# Patient Record
Sex: Male | Born: 1935 | Race: White | Hispanic: No | Marital: Married | Smoking: Former smoker
Health system: Southern US, Community
[De-identification: ages and names within clinical notes are randomized; demographics above are authoritative.]

## PROBLEM LIST (undated history)

## (undated) DIAGNOSIS — N400 Enlarged prostate without lower urinary tract symptoms: Secondary | ICD-10-CM

## (undated) DIAGNOSIS — R3915 Urgency of urination: Secondary | ICD-10-CM

## (undated) DIAGNOSIS — K449 Diaphragmatic hernia without obstruction or gangrene: Secondary | ICD-10-CM

## (undated) DIAGNOSIS — K644 Residual hemorrhoidal skin tags: Secondary | ICD-10-CM

## (undated) DIAGNOSIS — N200 Calculus of kidney: Secondary | ICD-10-CM

## (undated) DIAGNOSIS — M1990 Unspecified osteoarthritis, unspecified site: Secondary | ICD-10-CM

## (undated) DIAGNOSIS — IMO0001 Reserved for inherently not codable concepts without codable children: Secondary | ICD-10-CM

## (undated) DIAGNOSIS — Z87898 Personal history of other specified conditions: Secondary | ICD-10-CM

## (undated) DIAGNOSIS — Z5189 Encounter for other specified aftercare: Secondary | ICD-10-CM

## (undated) DIAGNOSIS — K922 Gastrointestinal hemorrhage, unspecified: Secondary | ICD-10-CM

## (undated) DIAGNOSIS — M810 Age-related osteoporosis without current pathological fracture: Secondary | ICD-10-CM

## (undated) DIAGNOSIS — Z87442 Personal history of urinary calculi: Secondary | ICD-10-CM

## (undated) DIAGNOSIS — K219 Gastro-esophageal reflux disease without esophagitis: Secondary | ICD-10-CM

## (undated) DIAGNOSIS — Z8739 Personal history of other diseases of the musculoskeletal system and connective tissue: Secondary | ICD-10-CM

## (undated) DIAGNOSIS — D649 Anemia, unspecified: Secondary | ICD-10-CM

## (undated) HISTORY — DX: Residual hemorrhoidal skin tags: K64.4

## (undated) HISTORY — PX: CATARACT EXTRACTION: SUR2

## (undated) HISTORY — DX: Age-related osteoporosis without current pathological fracture: M81.0

## (undated) HISTORY — DX: Reserved for inherently not codable concepts without codable children: IMO0001

## (undated) HISTORY — DX: Encounter for other specified aftercare: Z51.89

## (undated) HISTORY — DX: Unspecified osteoarthritis, unspecified site: M19.90

## (undated) HISTORY — PX: OTHER SURGICAL HISTORY: SHX169

## (undated) HISTORY — PX: KNEE ARTHROSCOPY: SUR90

## (undated) HISTORY — PX: COLONOSCOPY: SHX174

## (undated) HISTORY — PX: JOINT REPLACEMENT: SHX530

---
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
## 1983-07-14 HISTORY — PX: ABDOMINAL SURGERY: SHX537

## 1998-01-26 ENCOUNTER — Emergency Department (HOSPITAL_COMMUNITY): Admission: EM | Admit: 1998-01-26 | Discharge: 1998-01-26 | Payer: Self-pay | Admitting: Family Medicine

## 2000-01-23 ENCOUNTER — Encounter: Payer: Self-pay | Admitting: Emergency Medicine

## 2000-01-23 ENCOUNTER — Emergency Department (HOSPITAL_COMMUNITY): Admission: EM | Admit: 2000-01-23 | Discharge: 2000-01-23 | Payer: Self-pay | Admitting: Emergency Medicine

## 2000-02-27 ENCOUNTER — Encounter: Admission: RE | Admit: 2000-02-27 | Discharge: 2000-02-27 | Payer: Self-pay | Admitting: *Deleted

## 2000-02-27 ENCOUNTER — Encounter: Payer: Self-pay | Admitting: *Deleted

## 2003-08-02 ENCOUNTER — Emergency Department (HOSPITAL_COMMUNITY): Admission: EM | Admit: 2003-08-02 | Discharge: 2003-08-02 | Payer: Self-pay | Admitting: Emergency Medicine

## 2003-08-05 ENCOUNTER — Emergency Department (HOSPITAL_COMMUNITY): Admission: EM | Admit: 2003-08-05 | Discharge: 2003-08-05 | Payer: Self-pay | Admitting: Emergency Medicine

## 2003-11-26 ENCOUNTER — Encounter: Payer: Self-pay | Admitting: Gastroenterology

## 2004-11-29 ENCOUNTER — Emergency Department (HOSPITAL_COMMUNITY): Admission: EM | Admit: 2004-11-29 | Discharge: 2004-11-29 | Payer: Self-pay | Admitting: Emergency Medicine

## 2006-04-05 ENCOUNTER — Encounter: Admission: RE | Admit: 2006-04-05 | Discharge: 2006-04-05 | Payer: Self-pay | Admitting: Internal Medicine

## 2006-04-05 IMAGING — CT CT PELVIS W/O CM
1 of 4 series · 15 of 36 positions shown, 19 images · IV contrast (agent unspecified)
Comparison: none

CLINICAL DATA: History of abdominal pain. 
 ABDOMEN CT WITHOUT CONTRAST:
TECHNIQUE: Multidetector CT imaging of the abdomen was performed following the standard protocol without IV contrast.
TECHNIQUE: Multidetector CT imaging of the pelvis was performed following the standard protocol without IV contrast.

[Series 2: renal stone · axial · 0.74mm/px · z∈[-408,-44]mm · 15 of 79 slices shown, 19 images]
[im 4/79  soft-tissue]
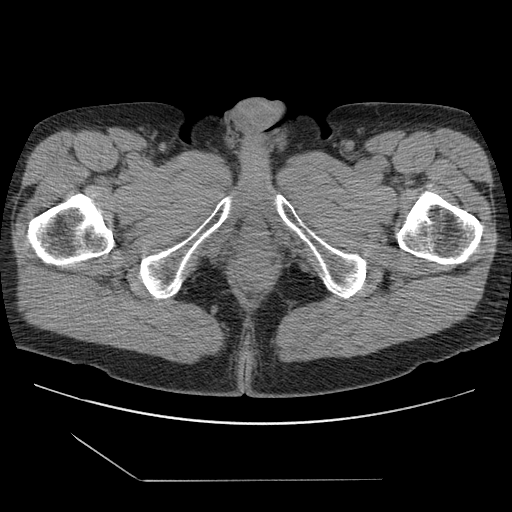
[im 4/79  bone]
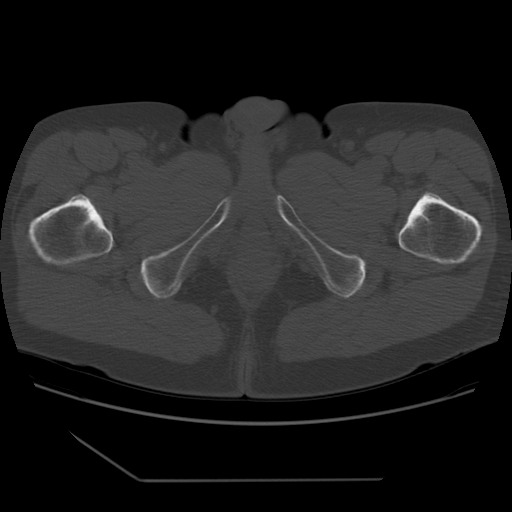
[im 10/79  soft-tissue]
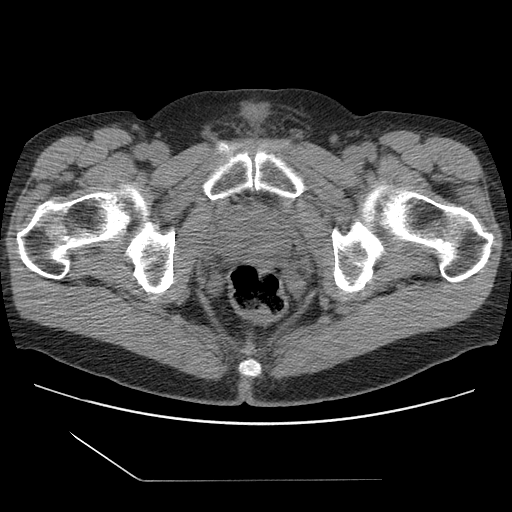
[im 16/79  soft-tissue]
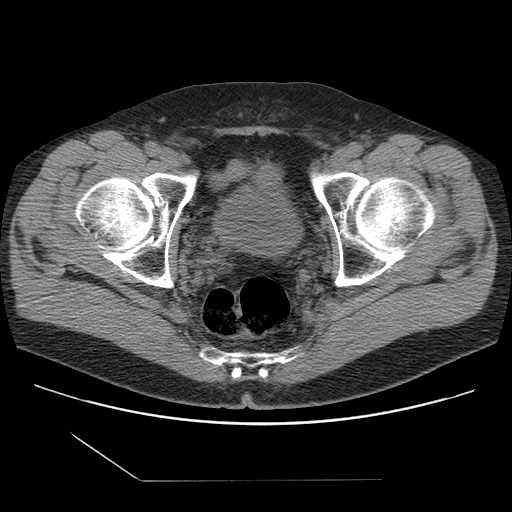
[im 22/79  soft-tissue]
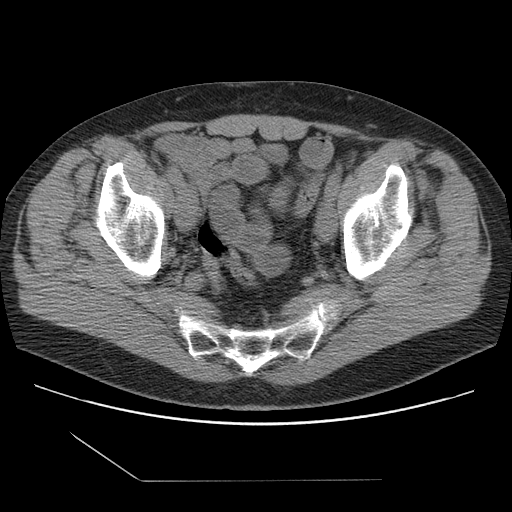
[im 28/79  soft-tissue]
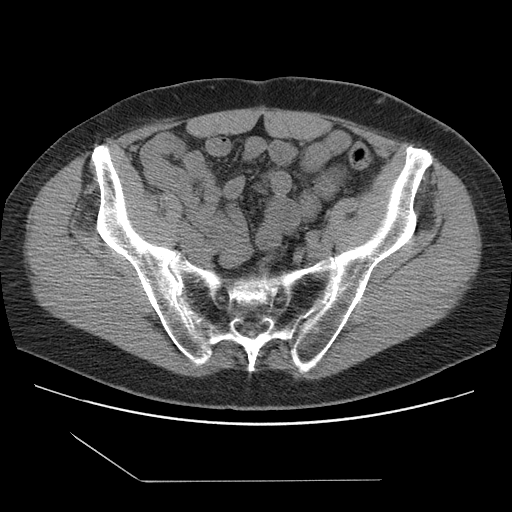
[im 34/79  soft-tissue]
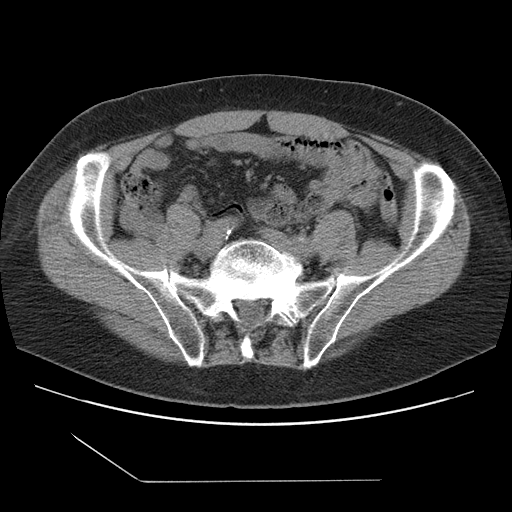
[im 40/79  soft-tissue]
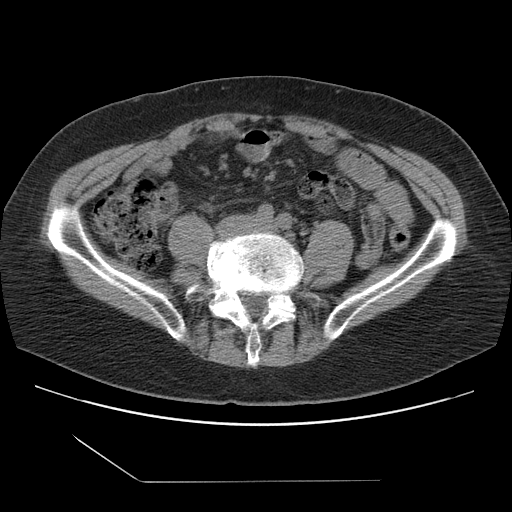
[im 46/79  soft-tissue]
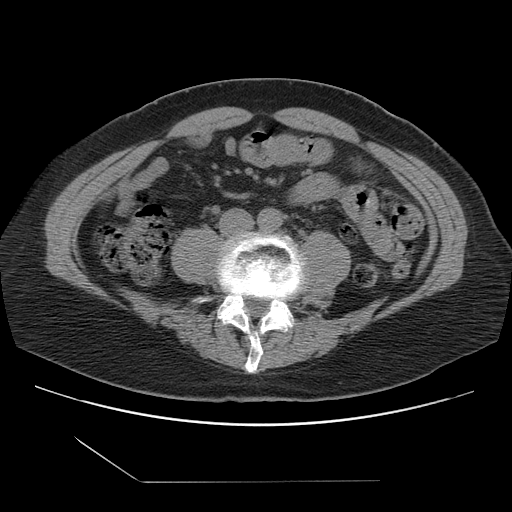
[im 52/79  soft-tissue]
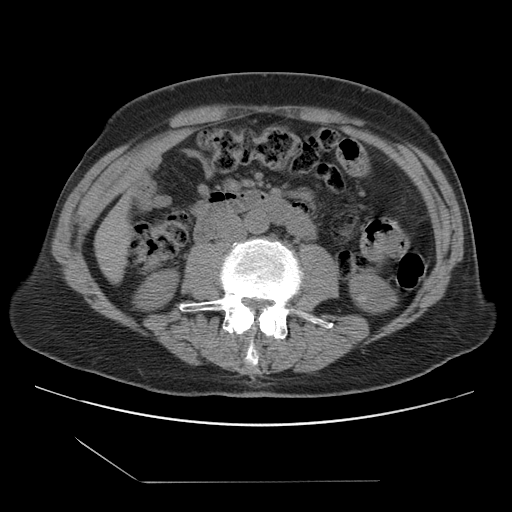
[im 52/79  bone]
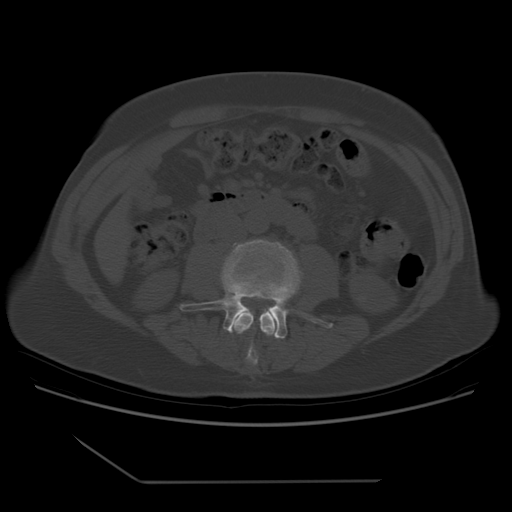
[im 58/79  soft-tissue]
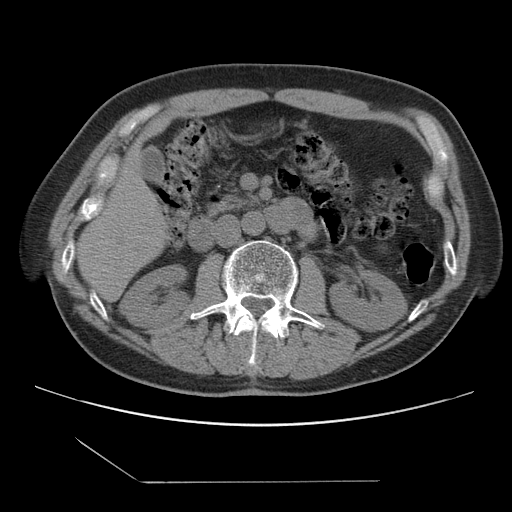
[im 64/79  soft-tissue]
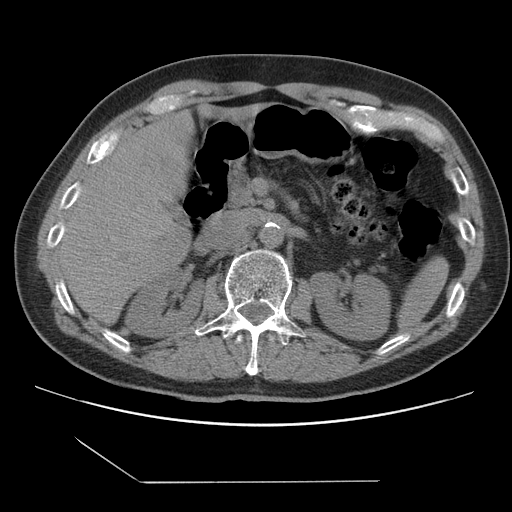
[im 67/79  lung]
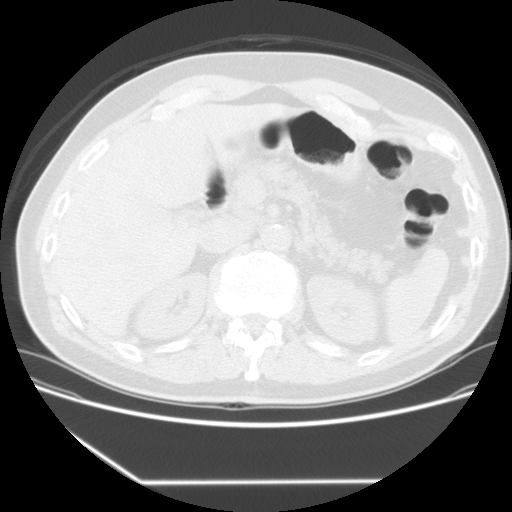
[im 70/79  soft-tissue]
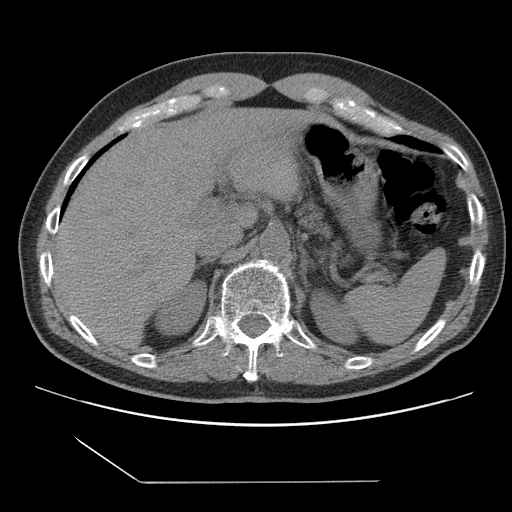
[im 70/79  lung]
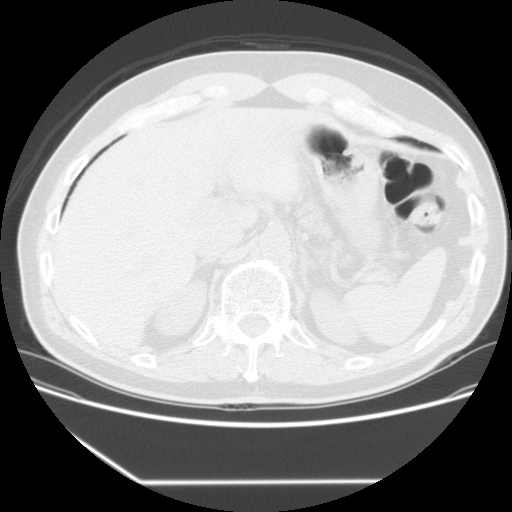
[im 73/79  lung]
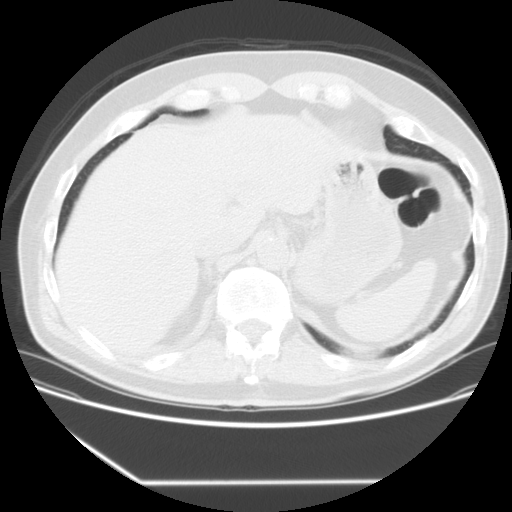
[im 76/79  soft-tissue]
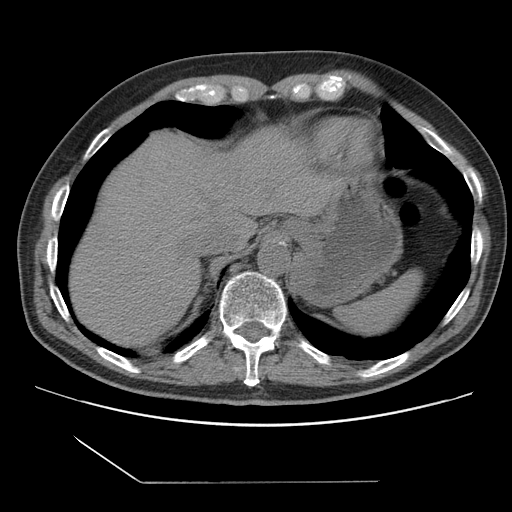
[im 76/79  lung]
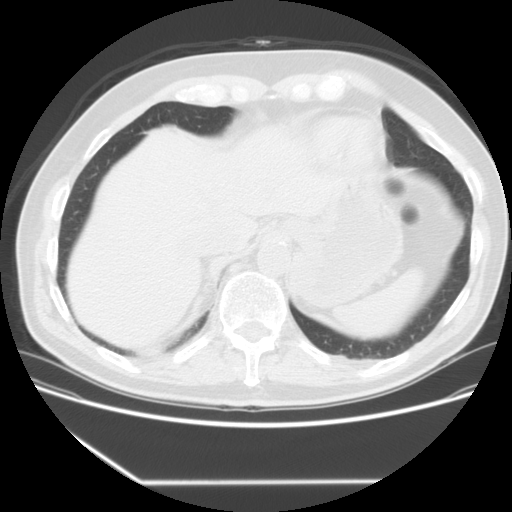

[15 of 36 positions shown; findings below may reference images not displayed]

FINDINGS: The lung bases are clear.   The liver appears normal in the unenhanced state.  No calcified gallstones are seen.  The pancreas is normal in size with normal peripancreatic fat planes.  The adrenal glands and spleen appear normal.  No hydronephrosis is seen and no renal calculi are noted. A small hyperdense cyst is noted in the lower pole of the left kidney. The abdominal aorta is normal in caliber.    No adenopathy is seen.
IMPRESSION: No renal or ureteral calculi are noted.  No hydronephrosis. 
 PELVIS CT WITHOUT CONTRAST:
FINDINGS: Scans were continued through the pelvis in the unenhanced state.   The distal ureters are normal in caliber.  The urinary bladder is decompressed and difficult to assess. The prostate is slightly prominent.  No pelvic mass of fluid is seen.  No acute bony abnormality is noted.    The terminal ileum and appendix appear normal.
IMPRESSION: 1.  No acute abnormality on CT of the pelvis.  The terminal ileum and appendix appear normal.  
 2.  Slightly prominent prostate.

## 2007-07-31 ENCOUNTER — Emergency Department (HOSPITAL_COMMUNITY): Admission: EM | Admit: 2007-07-31 | Discharge: 2007-07-31 | Payer: Self-pay | Admitting: *Deleted

## 2007-07-31 IMAGING — CT CT ABDOMEN W/O CM
2 of 4 series · 17 of 46 positions shown, 19 images · IV contrast (agent unspecified)
Comparison: Exam [DATE].

CLINICAL DATA: Right side kidney stone, right flank pain.  
 ABDOMEN CT WITHOUT CONTRAST:
TECHNIQUE: Multidetector CT imaging of the abdomen was performed following the standard protocol without IV contrast.
TECHNIQUE: Multidetector CT imaging of the pelvis was performed following the standard protocol without IV contrast.

[Series 2: stone_wo 5.0 b40f st · axial · 0.81mm/px · z∈[-500,-108]mm · 14 of 108 slices shown, 16 images]
[im 5/108  soft-tissue]
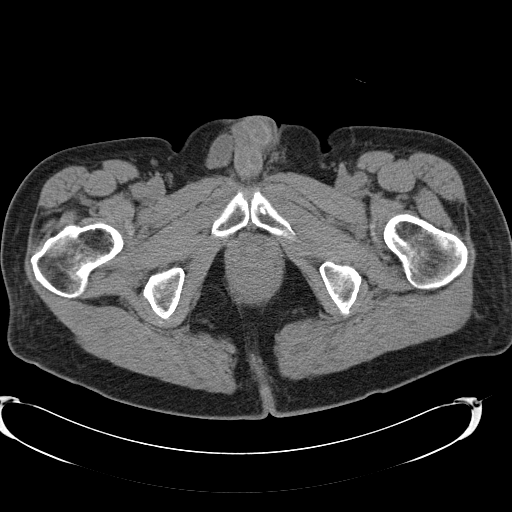
[im 5/108  bone]
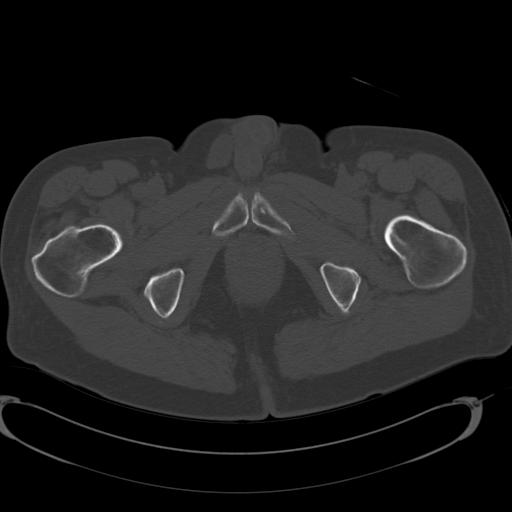
[im 13/108  soft-tissue]
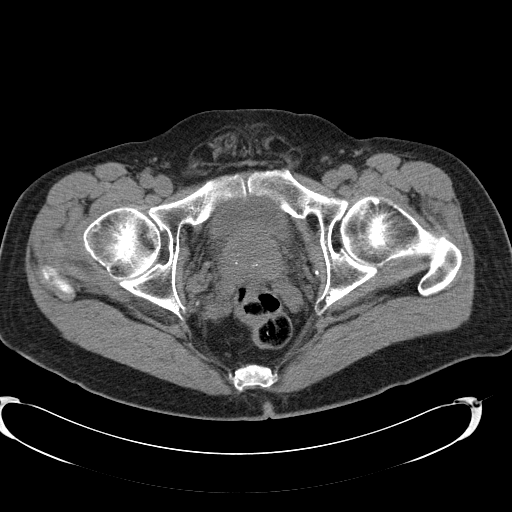
[im 22/108  soft-tissue]
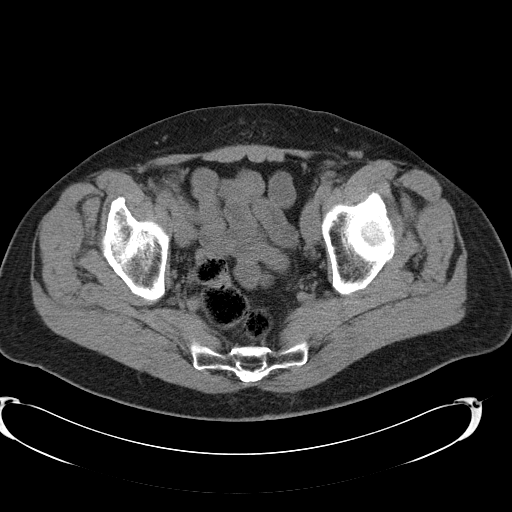
[im 30/108  soft-tissue]
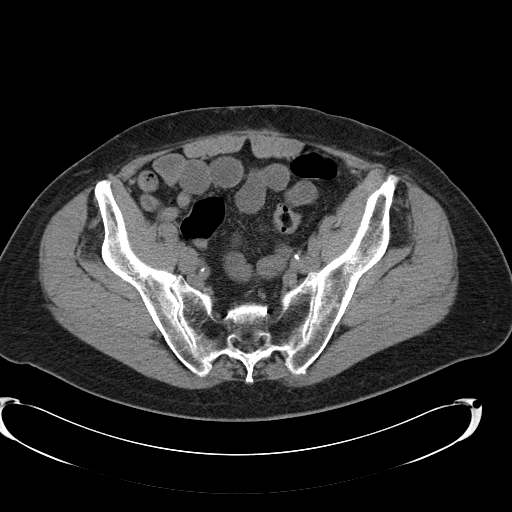
[im 35/108  soft-tissue]
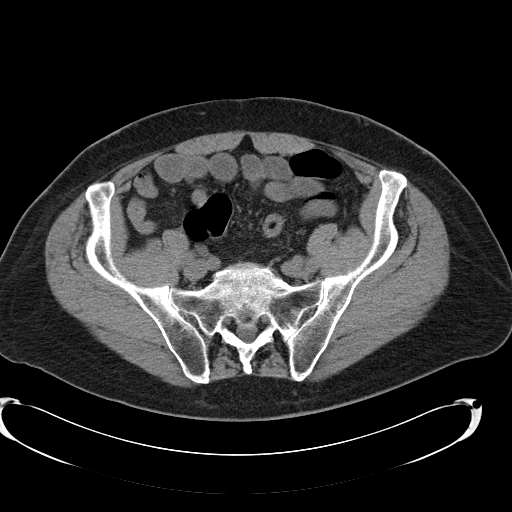
[im 43/108  soft-tissue]
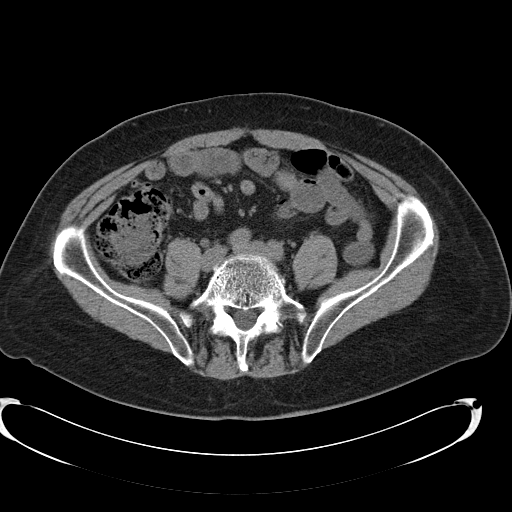
[im 52/108  soft-tissue]
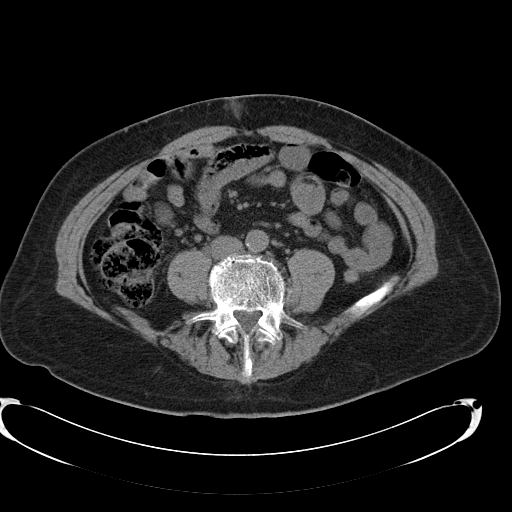
[im 56/108  soft-tissue]
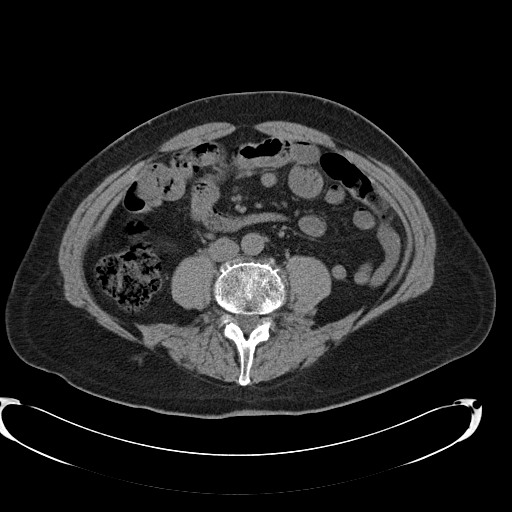
[im 65/108  soft-tissue]
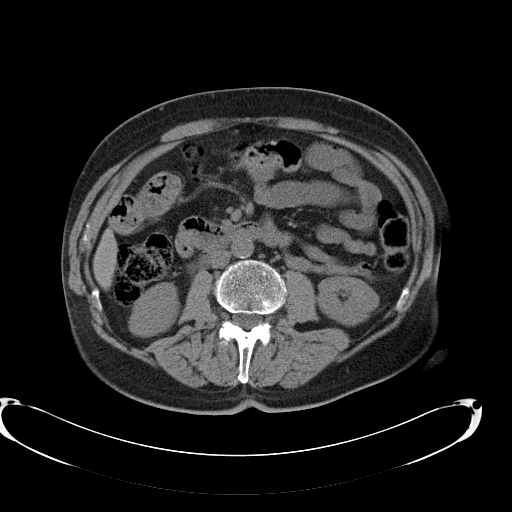
[im 65/108  bone]
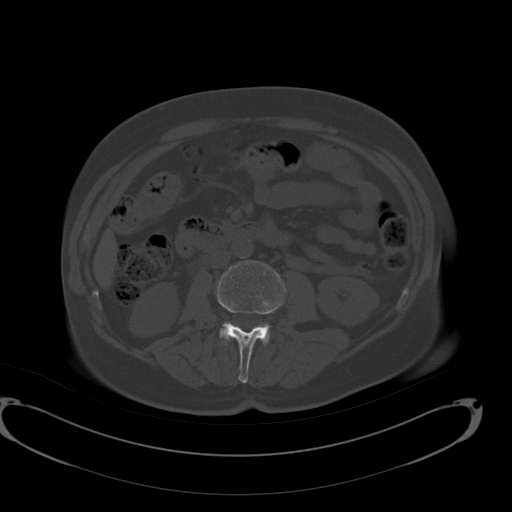
[im 73/108  soft-tissue]
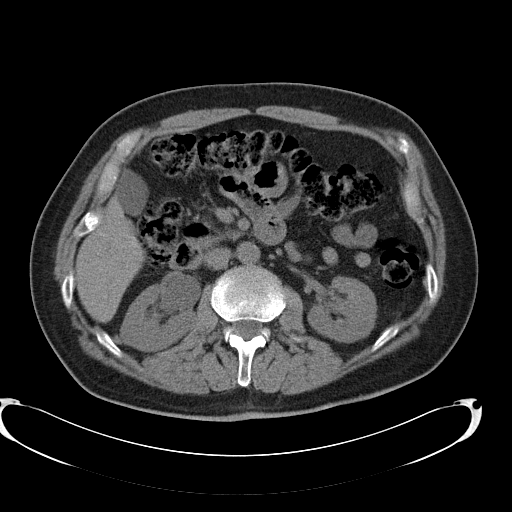
[im 82/108  soft-tissue]
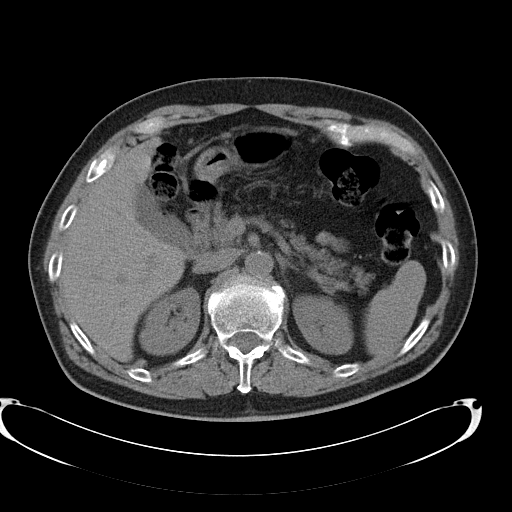
[im 86/108  soft-tissue]
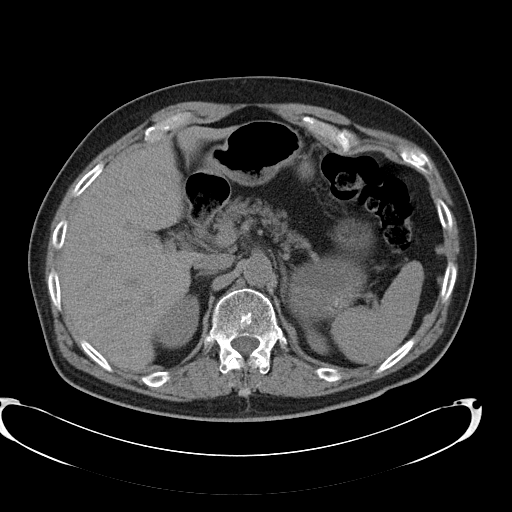
[im 95/108  soft-tissue]
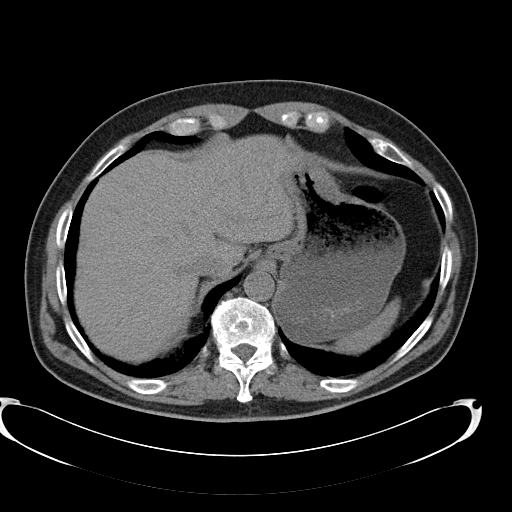
[im 103/108  soft-tissue]
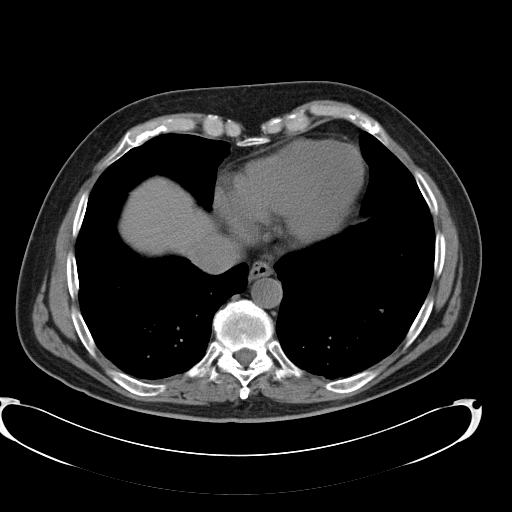

[Series 602: coronal abdomen · coronal · 0.87mm/px · 3 of 120 slices shown]
[im 40/120  soft-tissue]
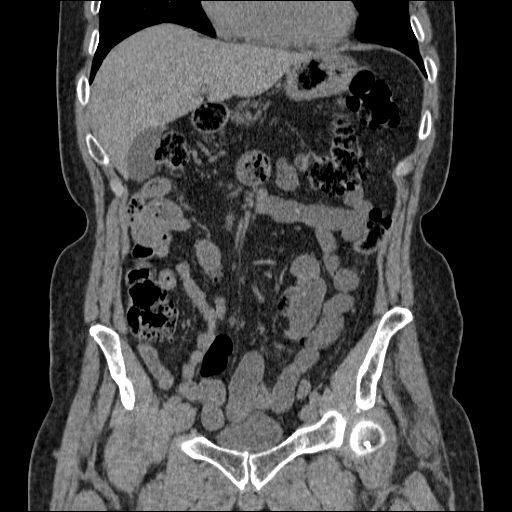
[im 53/120  soft-tissue]
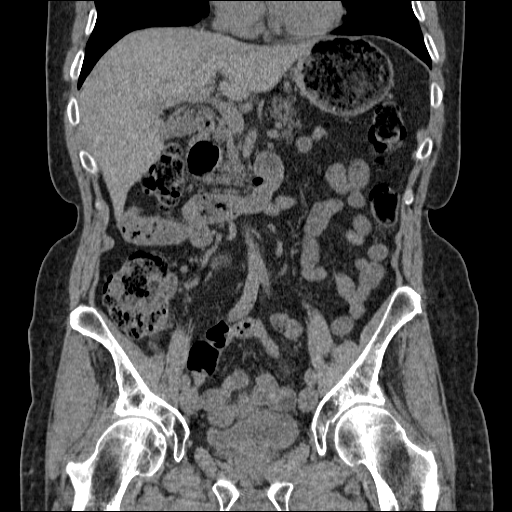
[im 67/120  soft-tissue]
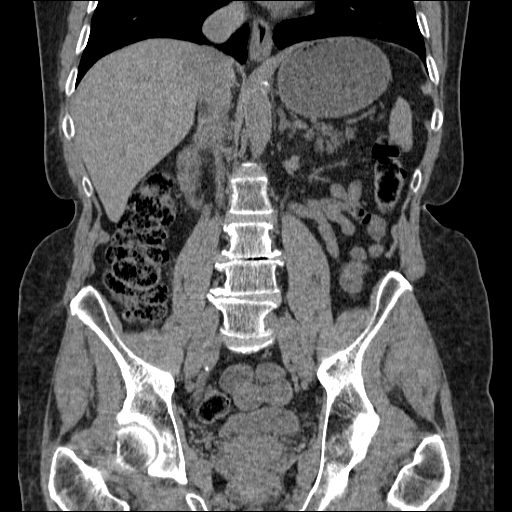

[17 of 46 positions shown; findings below may reference images not displayed]

FINDINGS: Two visualized lung bases are unremarkable.  The unenhanced liver, spleen, pancreas and adrenals are unremarkable.  There is mild right hydronephrosis and right hydroureter.  No calcifications are identified in proximal ureters bilaterally.  The left kidney is unremarkable.  Stool noted throughout the colon.  There is no bowel obstruction.  There is no ascites or free air.  No calcified gallstones are seen.  Mild degenerative changes of the lumbar spine are seen.
IMPRESSION: 1.  Mild right hydronephrosis and right hydroureter. 
 2.  No ureteral calcifications are seen in the proximal ureter.  
 PELVIS CT WITHOUT CONTRAST:
FINDINGS: There is mild dilatation of the distal right ureter.  There is a 6 X 4 mm calculus in distal right ureter about 1.7 cm from right UVJ/right urinary bladder wall.  Mild enlargement of the prostate gland is noted.  There is indentation of the prostate gland of urinary bladder base.  There is no pelvic ascites or adenopathy.  Normal appendix is partially visualized.
IMPRESSION: There is a 6 mm calculus in the distal right ureter about 1.7 cm from the right UVJ.

## 2007-08-23 ENCOUNTER — Ambulatory Visit (HOSPITAL_BASED_OUTPATIENT_CLINIC_OR_DEPARTMENT_OTHER): Admission: RE | Admit: 2007-08-23 | Discharge: 2007-08-23 | Payer: Self-pay | Admitting: Urology

## 2008-07-13 HISTORY — PX: NISSEN FUNDOPLICATION: SHX2091

## 2008-10-16 ENCOUNTER — Encounter (INDEPENDENT_AMBULATORY_CARE_PROVIDER_SITE_OTHER): Payer: Self-pay | Admitting: *Deleted

## 2008-12-07 ENCOUNTER — Ambulatory Visit: Payer: Self-pay | Admitting: Gastroenterology

## 2008-12-07 ENCOUNTER — Telehealth: Payer: Self-pay | Admitting: Gastroenterology

## 2008-12-20 ENCOUNTER — Telehealth (INDEPENDENT_AMBULATORY_CARE_PROVIDER_SITE_OTHER): Payer: Self-pay | Admitting: *Deleted

## 2008-12-27 DIAGNOSIS — K5730 Diverticulosis of large intestine without perforation or abscess without bleeding: Secondary | ICD-10-CM | POA: Insufficient documentation

## 2008-12-27 DIAGNOSIS — K219 Gastro-esophageal reflux disease without esophagitis: Secondary | ICD-10-CM | POA: Insufficient documentation

## 2008-12-27 DIAGNOSIS — N200 Calculus of kidney: Secondary | ICD-10-CM | POA: Insufficient documentation

## 2008-12-28 ENCOUNTER — Ambulatory Visit: Payer: Self-pay | Admitting: Gastroenterology

## 2008-12-28 DIAGNOSIS — N138 Other obstructive and reflux uropathy: Secondary | ICD-10-CM | POA: Insufficient documentation

## 2008-12-28 DIAGNOSIS — N401 Enlarged prostate with lower urinary tract symptoms: Secondary | ICD-10-CM

## 2008-12-28 DIAGNOSIS — R159 Full incontinence of feces: Secondary | ICD-10-CM | POA: Insufficient documentation

## 2008-12-28 LAB — CONVERTED CEMR LAB: Tissue Transglutaminase Ab, IgA: 0.9 units (ref <7)

## 2008-12-31 LAB — CONVERTED CEMR LAB
ALT: 20 units/L (ref 0–53)
Albumin: 4.1 g/dL (ref 3.5–5.2)
BUN: 19 mg/dL (ref 6–23)
Basophils Relative: 0.5 % (ref 0.0–3.0)
CO2: 30 meq/L (ref 19–32)
Calcium: 9.7 mg/dL (ref 8.4–10.5)
Chloride: 105 meq/L (ref 96–112)
Creatinine, Ser: 1 mg/dL (ref 0.4–1.5)
Ferritin: 58.5 ng/mL (ref 22.0–322.0)
Glucose, Bld: 97 mg/dL (ref 70–99)
HCT: 45.2 % (ref 39.0–52.0)
Hemoglobin: 15.8 g/dL (ref 13.0–17.0)
Lymphocytes Relative: 23.9 % (ref 12.0–46.0)
Lymphs Abs: 1.6 10*3/uL (ref 0.7–4.0)
MCHC: 34.9 g/dL (ref 30.0–36.0)
Monocytes Relative: 14.7 % — ABNORMAL HIGH (ref 3.0–12.0)
Neutro Abs: 4.1 10*3/uL (ref 1.4–7.7)
RBC: 4.77 M/uL (ref 4.22–5.81)
TSH: 1.04 microintl units/mL (ref 0.35–5.50)
Total Protein: 7.3 g/dL (ref 6.0–8.3)
Transferrin: 270.9 mg/dL (ref 212.0–360.0)
Vitamin B-12: 191 pg/mL — ABNORMAL LOW (ref 211–911)

## 2009-01-01 ENCOUNTER — Ambulatory Visit: Payer: Self-pay | Admitting: Gastroenterology

## 2009-01-01 DIAGNOSIS — E538 Deficiency of other specified B group vitamins: Secondary | ICD-10-CM | POA: Insufficient documentation

## 2009-01-09 ENCOUNTER — Ambulatory Visit: Payer: Self-pay | Admitting: Gastroenterology

## 2009-01-16 ENCOUNTER — Ambulatory Visit: Payer: Self-pay | Admitting: Gastroenterology

## 2009-01-23 ENCOUNTER — Ambulatory Visit: Payer: Self-pay | Admitting: Gastroenterology

## 2009-01-23 ENCOUNTER — Encounter: Payer: Self-pay | Admitting: Gastroenterology

## 2009-01-24 ENCOUNTER — Telehealth: Payer: Self-pay | Admitting: Gastroenterology

## 2009-01-25 ENCOUNTER — Encounter: Payer: Self-pay | Admitting: Gastroenterology

## 2009-01-28 ENCOUNTER — Telehealth: Payer: Self-pay | Admitting: Gastroenterology

## 2009-01-30 DIAGNOSIS — K449 Diaphragmatic hernia without obstruction or gangrene: Secondary | ICD-10-CM | POA: Insufficient documentation

## 2009-02-14 ENCOUNTER — Ambulatory Visit: Payer: Self-pay | Admitting: Gastroenterology

## 2009-03-01 ENCOUNTER — Encounter: Payer: Self-pay | Admitting: Gastroenterology

## 2009-03-08 ENCOUNTER — Encounter: Admission: RE | Admit: 2009-03-08 | Discharge: 2009-03-08 | Payer: Self-pay | Admitting: Surgery

## 2009-03-08 IMAGING — RF DG UGI W/ HIGH DENSITY W/KUB
18 of 21 series · 18 of 21 positions shown · non-contrast
Comparison: None

CLINICAL DATA: Gastroesophageal reflux disease.

UPPER GI SERIES W/HIGH DENSITY W/KUB
TECHNIQUE: After obtaining a scout radiograph, upper GI series
performed with high density barium and effervescent agent. Thin
barium also used.
Fluoroscopy Time: 2.0 minutes.

[Series 1: run · 1 of 1 slices shown (1 of 17)]
[im 1/1]
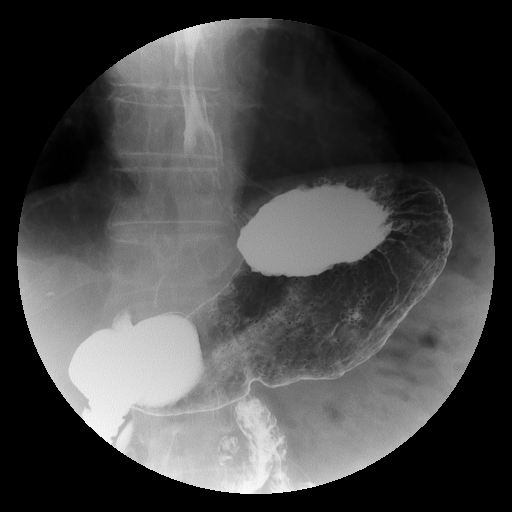

[Series 2: run · 1 of 1 slices shown (2 of 17)]
[im 1/1]
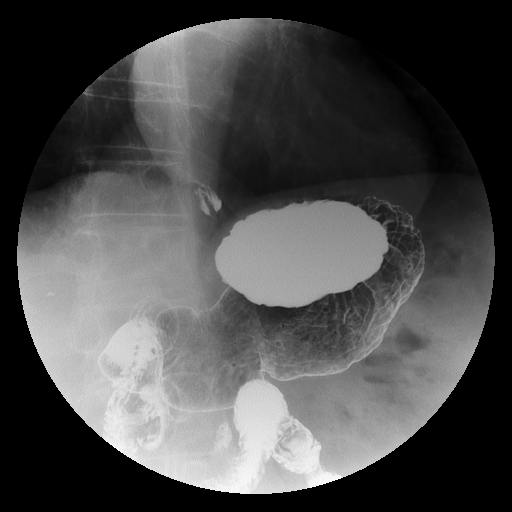

[Series 3: run · 1 of 1 slices shown (3 of 17)]
[im 1/1]
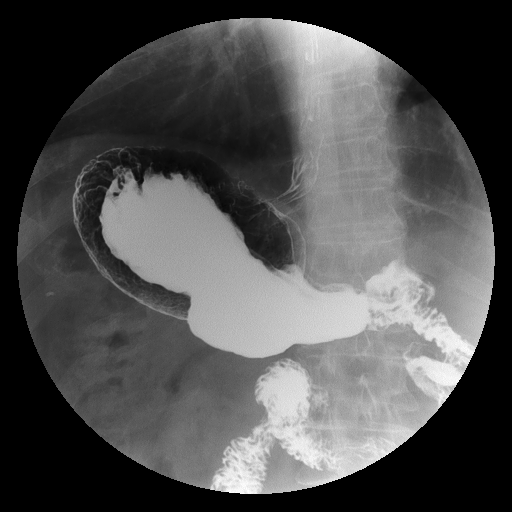

[Series 5: run · 1 of 1 slices shown (4 of 17)]
[im 1/1]
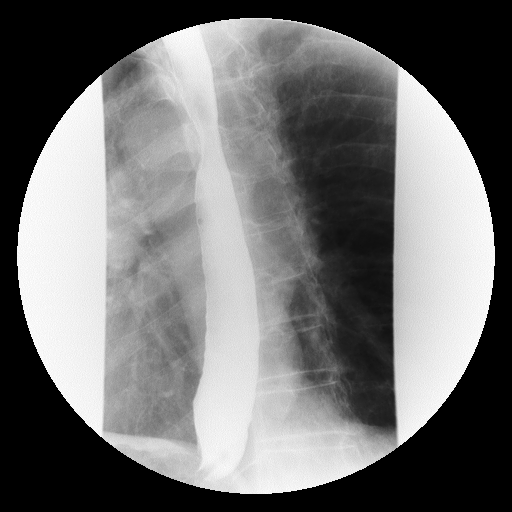

[Series 6: run · 1 of 1 slices shown (5 of 17)]
[im 1/1]
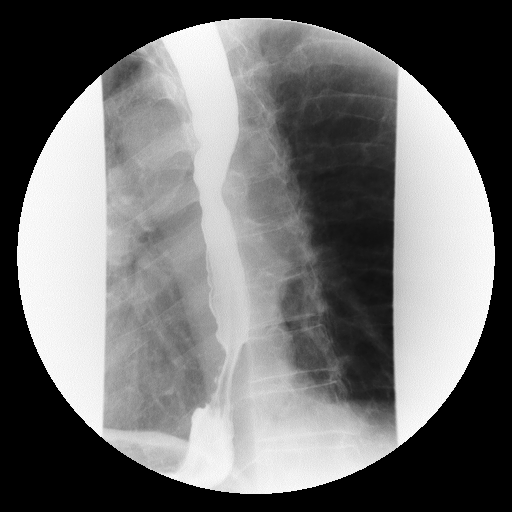

[Series 7: run · 1 of 1 slices shown (6 of 17)]
[im 1/1]
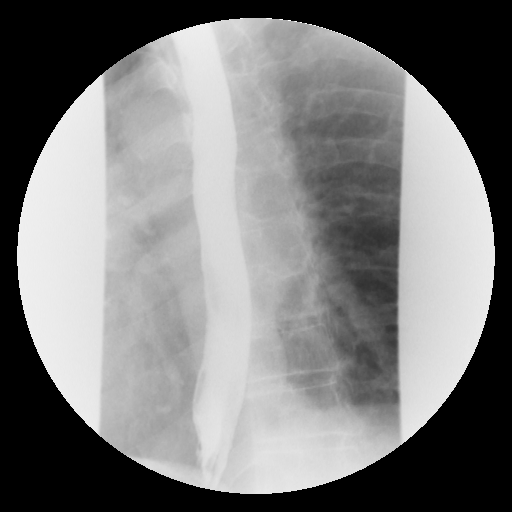

[Series 8: run · 1 of 1 slices shown (7 of 17)]
[im 1/1]
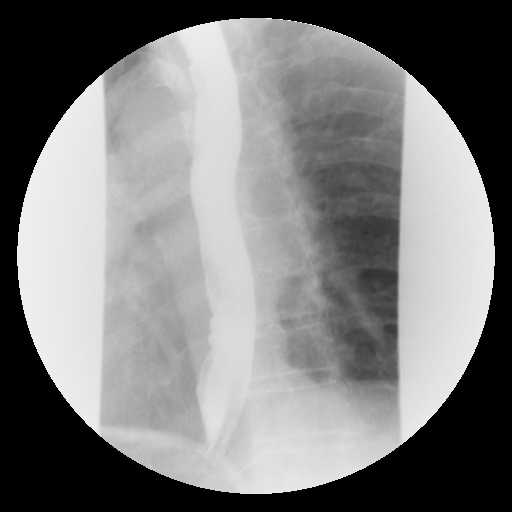

[Series 9: run · 1 of 1 slices shown (8 of 17)]
[im 1/1]
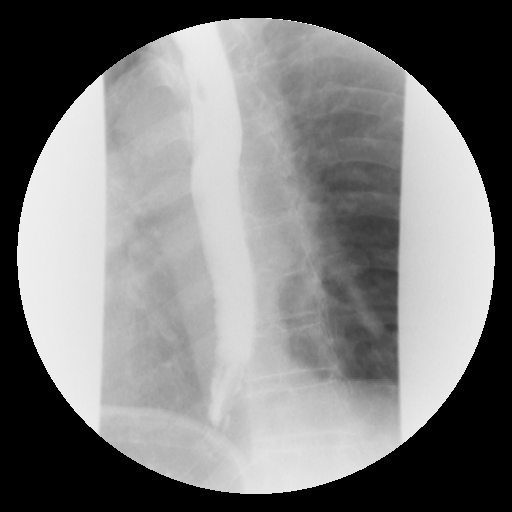

[Series 10: run · 1 of 1 slices shown (9 of 17)]
[im 1/1]
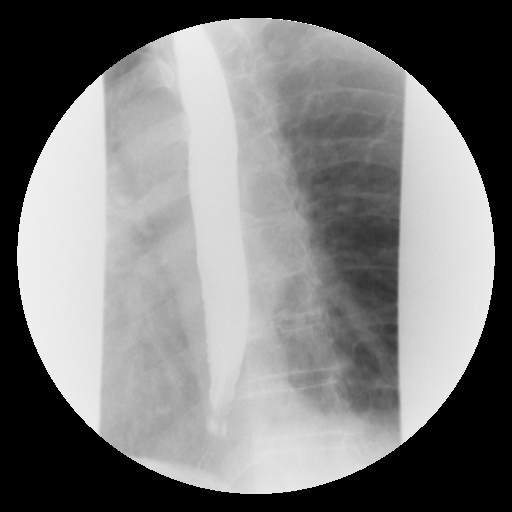

[Series 12: run · 1 of 1 slices shown (10 of 17)]
[im 1/1]
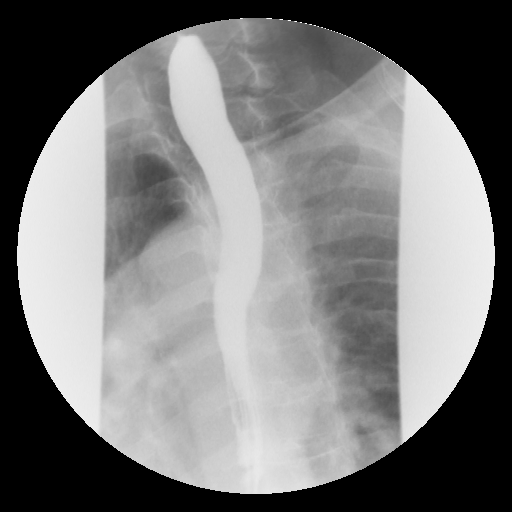

[Series 13: run · 1 of 1 slices shown (11 of 17)]
[im 1/1]
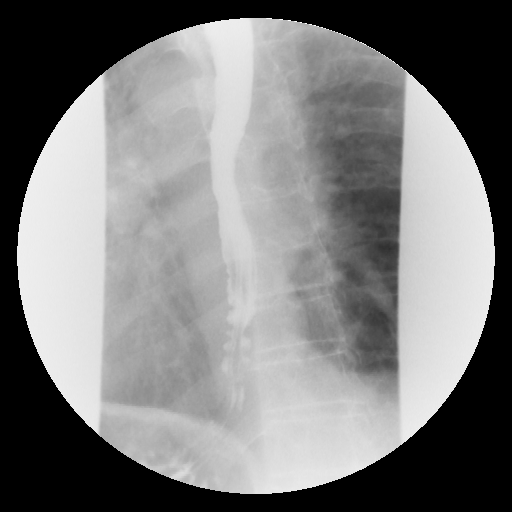

[Series 14: run · 1 of 1 slices shown (12 of 17)]
[im 1/1]
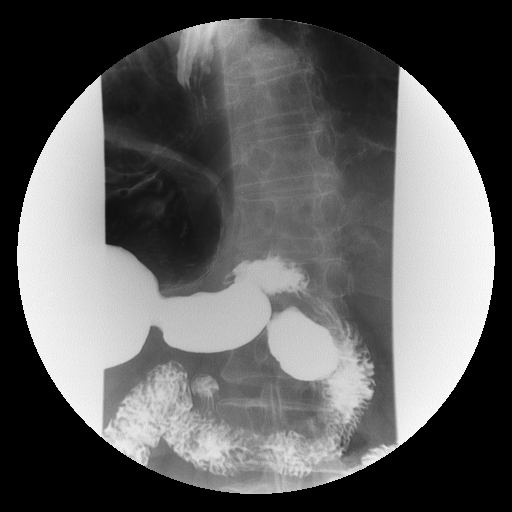

[Series 15: run · 1 of 1 slices shown (13 of 17)]
[im 1/1]
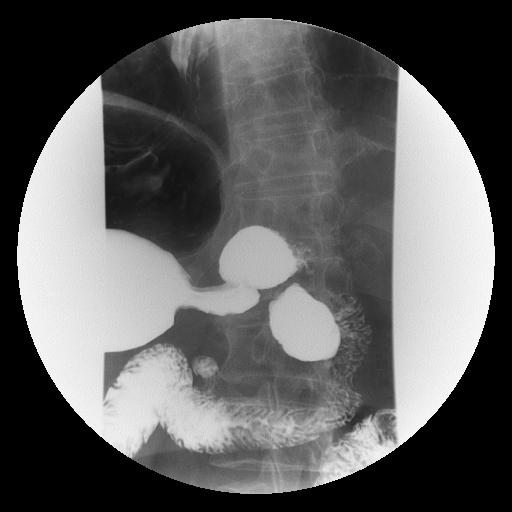

[Series 16: run · 1 of 1 slices shown (14 of 17)]
[im 1/1]
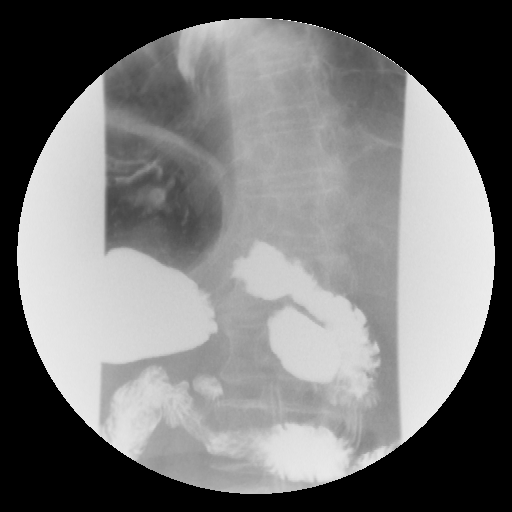

[Series 17: run · 1 of 1 slices shown (15 of 17)]
[im 1/1]
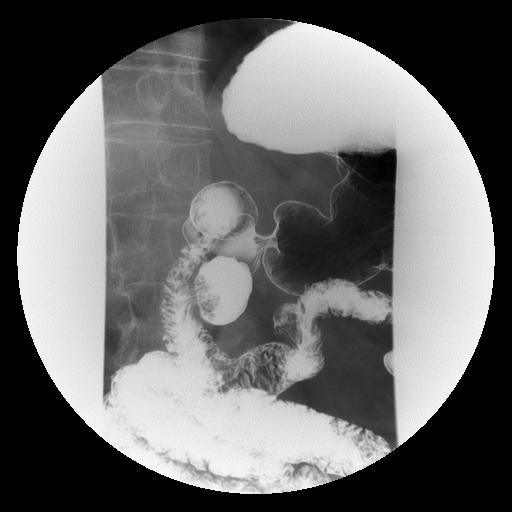

[Series 19: run · 1 of 1 slices shown (16 of 17)]
[im 1/1]
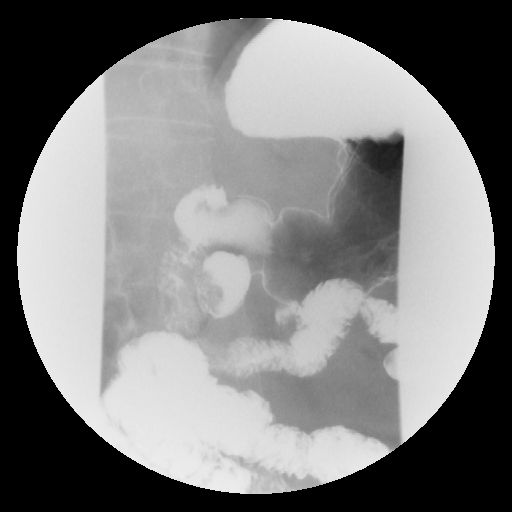

[Series 20: run · 1 of 1 slices shown (17 of 17)]
[im 1/1]
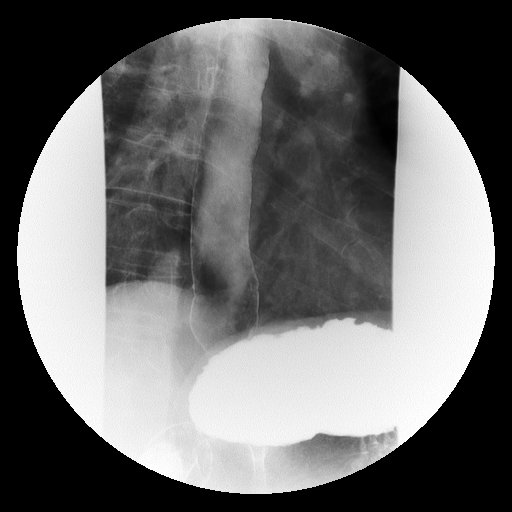

[Series 1001: view not recorded · 0.20mm/px · 1 of 1 slices shown]
[im 1/1]
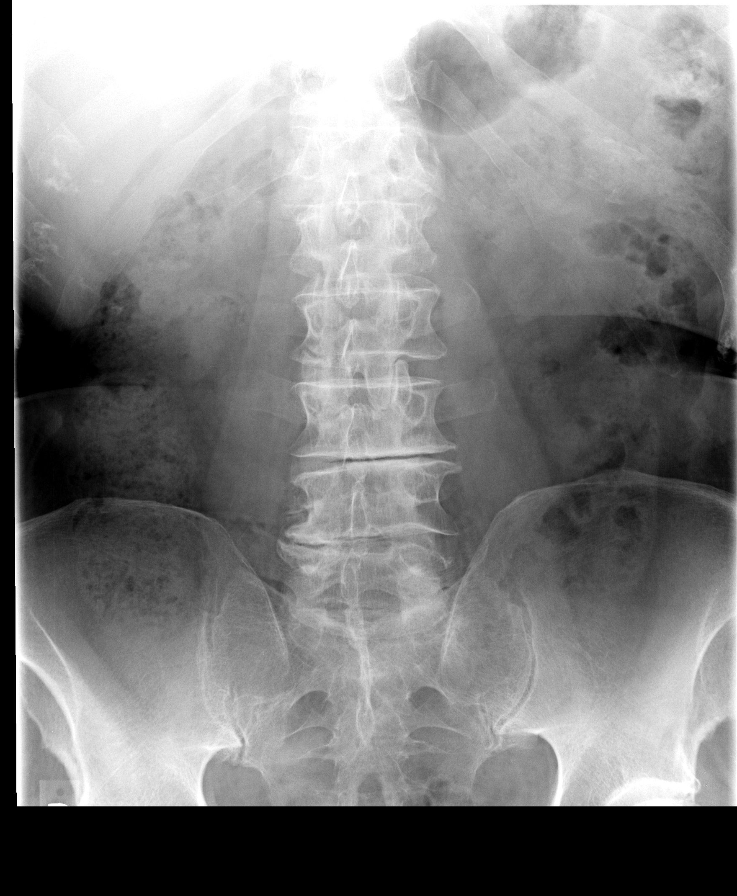

[18 of 21 positions shown; findings below may reference images not displayed]

FINDINGS: There is disruption of [DATE] primary peristaltic waves
within the esophagus.  No stricture, fold thickening, or mass.
Scattered tertiary contractions noted.

Stomach is unremarkable.  No ulceration or mass.  There is a large
duodenal diverticulum in the second portion of the duodenum.
Smaller diverticulum noted in the distal ascending duodenum near
the ligament of Treitz.  Duodenal bulb unremarkable.

No reflux seen with water siphon maneuver.
IMPRESSION: Nonspecific esophageal motility disorder.

2 duodenal diverticula.

## 2009-03-20 ENCOUNTER — Ambulatory Visit: Payer: Self-pay | Admitting: Gastroenterology

## 2009-04-17 ENCOUNTER — Ambulatory Visit: Payer: Self-pay | Admitting: Gastroenterology

## 2009-05-09 ENCOUNTER — Inpatient Hospital Stay (HOSPITAL_COMMUNITY): Admission: RE | Admit: 2009-05-09 | Discharge: 2009-05-10 | Payer: Self-pay | Admitting: Surgery

## 2009-05-09 IMAGING — RF DG ESOPHAGUS
4 series · 4 of 4 positions shown · IV contrast (omnipaque)
Comparison: None

CLINICAL DATA: Status post Nissen fundoplication yesterday.

ESOPHOGRAM with Omnipaque.
TECHNIQUE: Single contrast examination was performed using 50 ml
Omnipaque.
Fluoroscopy time:  1.4 minutes.

[Series 1: run · 1 of 1 slices shown (1 of 4)]
[im 1/1]
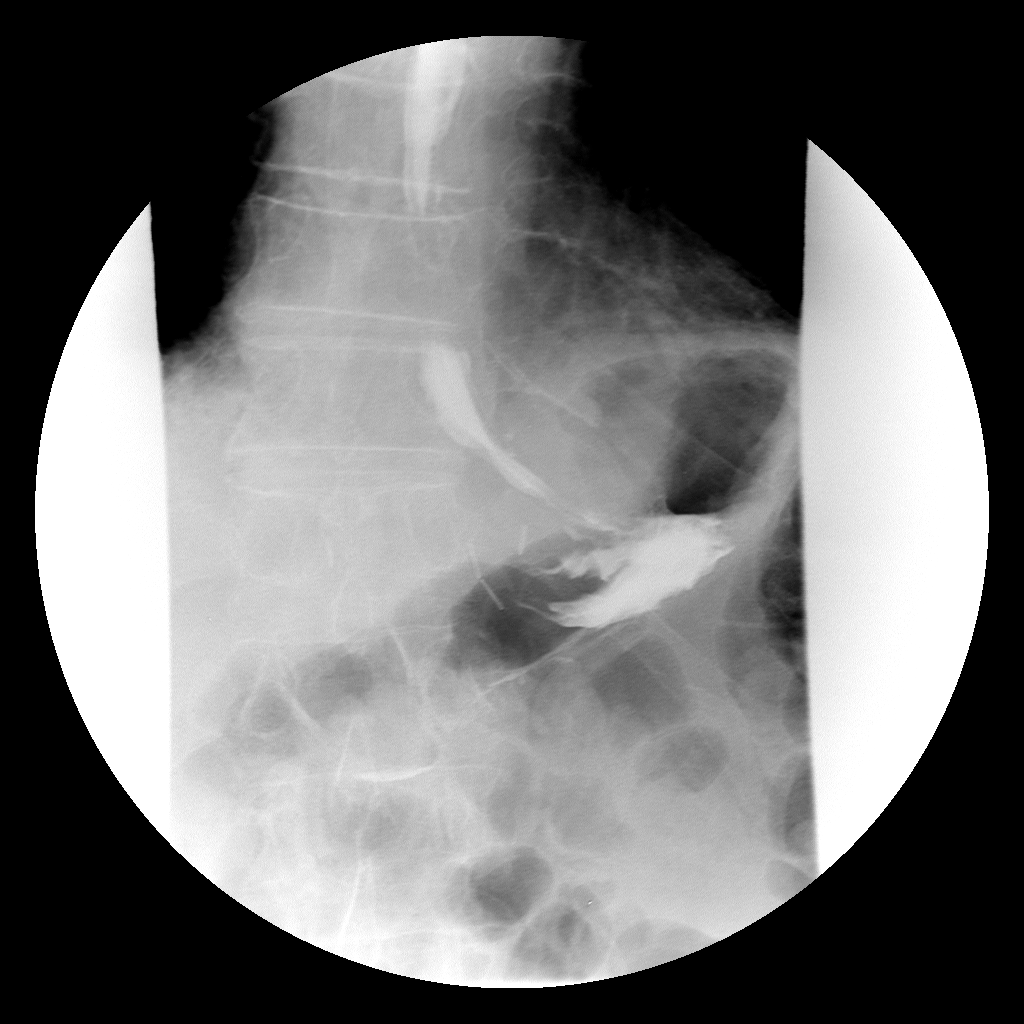

[Series 2: run · 1 of 1 slices shown (2 of 4)]
[im 1/1]
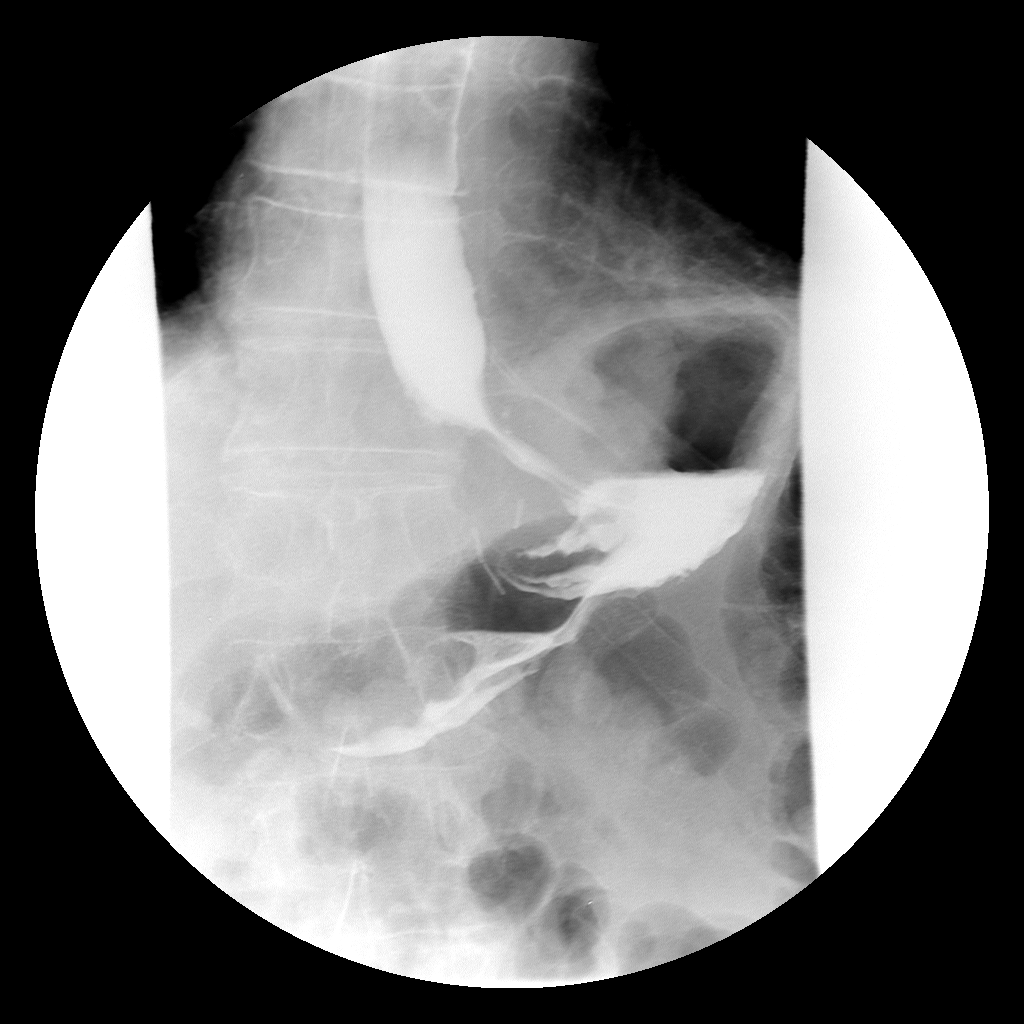

[Series 3: run · 1 of 1 slices shown (3 of 4)]
[im 1/1]
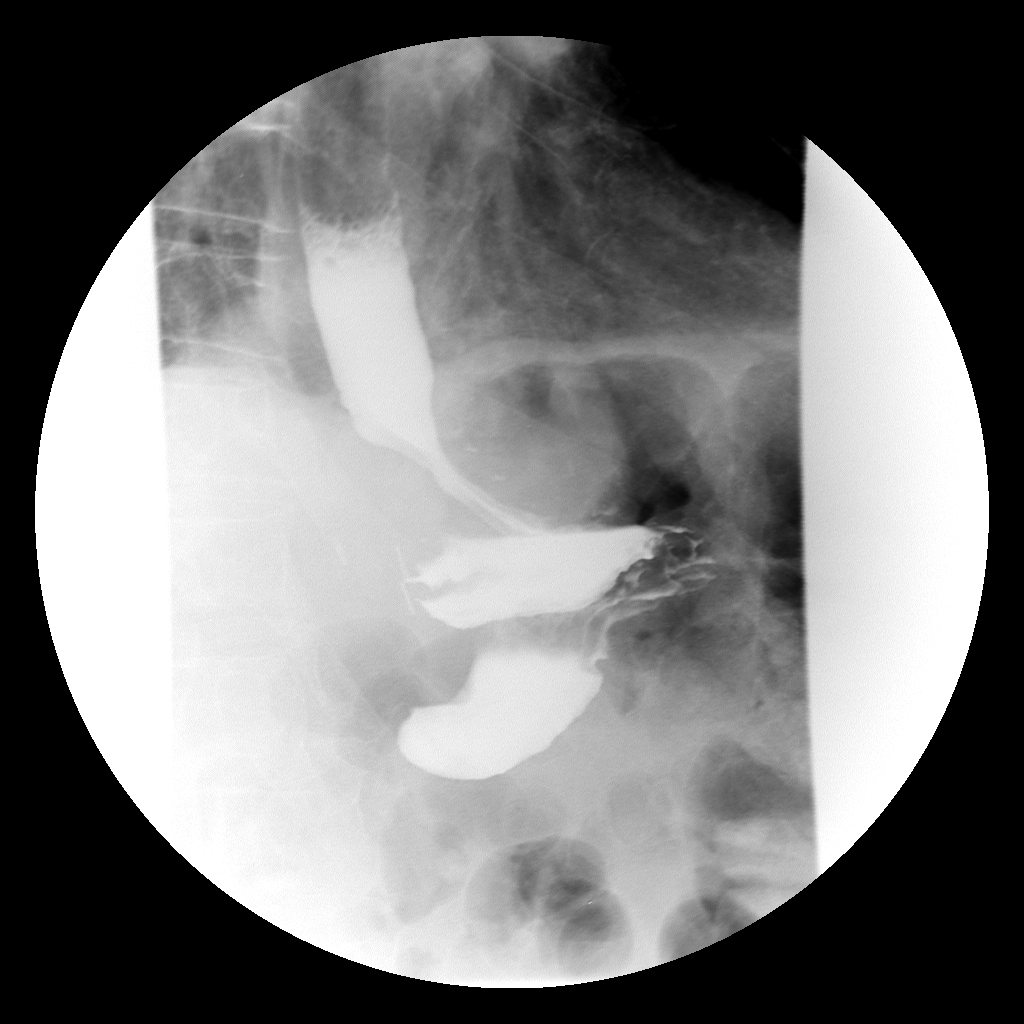

[Series 4: run · 1 of 1 slices shown (4 of 4)]
[im 1/1]
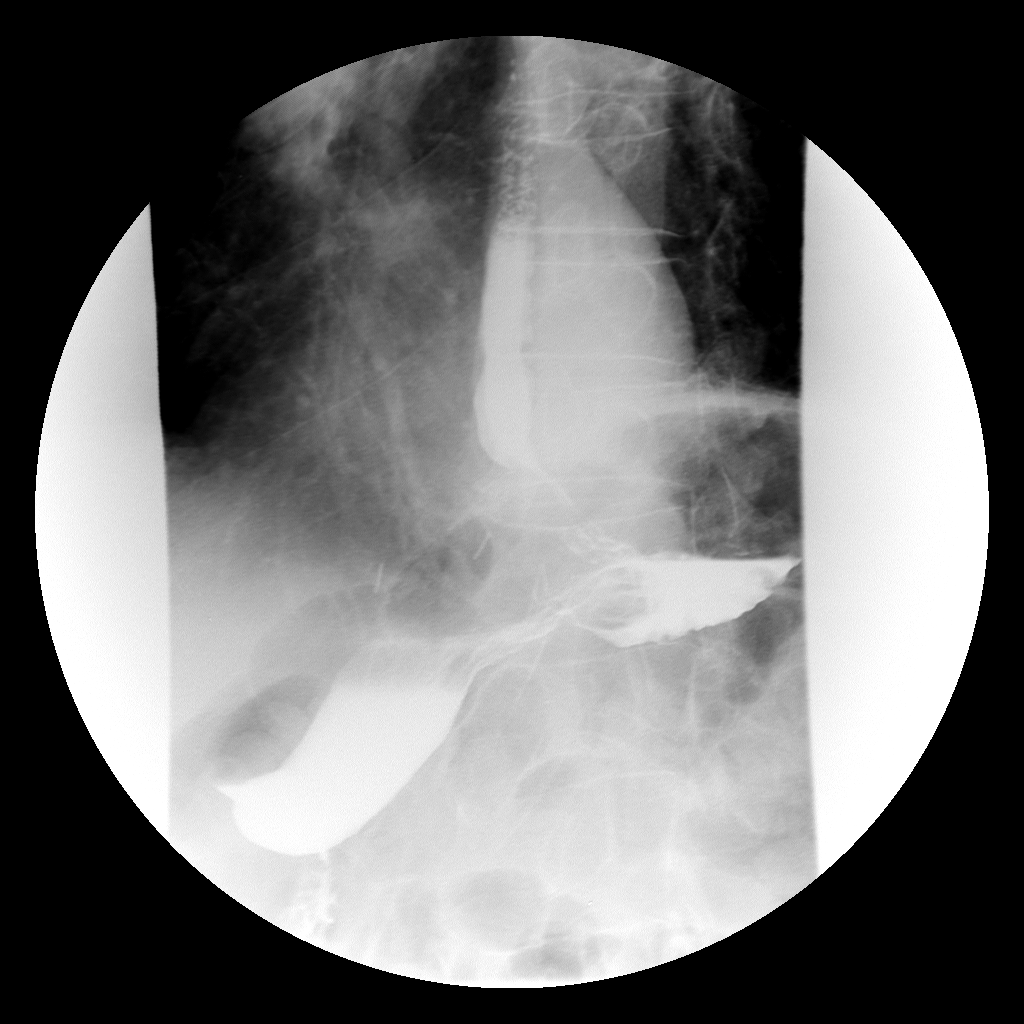

[4 of 4 positions shown; findings below may reference images not displayed]

FINDINGS: The patient was studied in the erect position under
fluoroscopic control.  Fundoplication defect on the proximal
stomach.  No contrast leakage or obstruction. Dr. JINKU
was present during the exam.
IMPRESSION: Unremarkable esophagram post Nissen fundoplication.

## 2009-07-19 ENCOUNTER — Telehealth: Payer: Self-pay | Admitting: Internal Medicine

## 2009-08-02 ENCOUNTER — Telehealth: Payer: Self-pay | Admitting: Gastroenterology

## 2009-08-02 ENCOUNTER — Ambulatory Visit: Payer: Self-pay | Admitting: Gastroenterology

## 2009-08-02 DIAGNOSIS — R1319 Other dysphagia: Secondary | ICD-10-CM | POA: Insufficient documentation

## 2009-08-05 ENCOUNTER — Telehealth: Payer: Self-pay | Admitting: Gastroenterology

## 2009-08-06 ENCOUNTER — Telehealth: Payer: Self-pay | Admitting: Gastroenterology

## 2009-09-13 ENCOUNTER — Encounter: Payer: Self-pay | Admitting: Gastroenterology

## 2010-08-12 NOTE — Procedures (Signed)
Summary: Endoscopy   EGD  Procedure date:  01/23/2009  Findings:      Location: [facility]   ENDOSCOPY PROCEDURE REPORT  PATIENT:  Lucas Shepherd, Lucas Shepherd  MR#:  [id number] BIRTHDATE:   [birth year], 79 yrs. old   GENDER:   male  ENDOSCOPIST:   Vania Rea. Jarold Motto, MD, [facility] Referred by:   PROCEDURE DATE:  01/23/2009 PROCEDURE:  EGD with biopsy ASA CLASS:   Class II INDICATIONS: refractory GERD  MEDICATIONS:    Fentanyl 25 mcg IV, Versed 2 mg IV, glycopyrrolate (Robinal) 0.2 mg IV TOPICAL ANESTHETIC:   Exactacain Spray  DESCRIPTION OF PROCEDURE:   After the risks benefits and alternatives of the procedure were thoroughly explained, informed consent was obtained.  The LB GIF-H180 [id number] endoscope was introduced through the mouth and advanced to the second portion of the duodenum, without limitations.  The instrument was slowly withdrawn as the mucosa was fully examined. <<PROCEDUREIMAGES>>      <<OLD IMAGES>>  A hiatal hernia was found. LARGE 5CM HIATIAL HERNIA NOTED.  Multiple erosions were found in the distal esophagus. ?? BARRETT'S MUCOSA BIOPSIED.NO STRICTURE NOTED.  Normal duodenal folds were noted.  The stomach was entered and closely examined. The antrum, angularis, and lesser curvature were well visualized, including a retroflexed view of the cardia and fundus. The stomach wall was normally distensable. The scope passed easily through the pylorus into the duodenum.    Retroflexed views revealed a hiatal hernia.    The scope was then withdrawn from the patient and the procedure completed.  COMPLICATIONS:   None  ENDOSCOPIC IMPRESSION:  1) Hiatal hernia  2) Erosions, multiple in the distal esophagus  3) Normal duodenal folds  4) Normal stomach  5) A hiatal hernia  EROSIVE ESOPHAGITIS AND LARGE HH.INTOLERANCE TO PPI RX. PER RECURRENT DIARRHEA. RECOMMENDATIONS:  1) anti-reflux regimen  2) await biopsy results  PROTONIX 40 MG/DAY TRIAL.IF HE CANNOT  TOLERATE THIS PER RECURRENT, HE WILL NEED REFERRAL TO DR. MARTIN FOR FUNDOPLICATION REPAIR.  REPEAT EXAM:   No   _______________________________ Vania Rea. Jarold Motto, MD, Clementeen Graham    CC: Theressa Millard, MD, Luretha Murphy, MD     REPORT OF SURGICAL PATHOLOGY   Case #: [id number] Patient Name: Lucas Shepherd, Lucas Shepherd. Office Chart Number:  [id number]   MRN: [id number] Pathologist: Beulah Gandy. Luisa Hart, MD DOB/Age  [birth year] (Age: 6)    Gender: M Date Taken:  01/23/2009 Date Received: 01/24/2009   FINAL DIAGNOSIS   ***MICROSCOPIC EXAMINATION AND DIAGNOSIS***   GASTROESOPHAGEAL JUNCTION, BIOPSIES:  INFLAMED GASTROESOPHAGEAL JUNCTION MUCOSA WITH FOCAL EROSION.  NO INTESTINAL METAPLASIA, DYSPLASIA OR MALIGNANCY IDENTIFIED.   COMMENT There are fragments of gastroesophageal junction mucosa and focally there is inflamed granulation tissue consistent with erosion.  No dysplasia or malignancy is identified.  An Alcian Blue stain is performed to determine the presence of intestinal metaplasia (goblet cell metaplasia). No intestinal metaplasia (goblet cell metaplasia) is identified with the Alcian Blue stain. The control stained appropriately.  (JDP:gt, 01/25/09)    gdt Date Reported:  01/25/2009     Beulah Gandy. Luisa Hart, MD *** Electronically Signed Out By JDP ***    January 25, 2009 MRN: [id number]    [facility] [address] [place], [place]  [address]    Dear Mr. Abdulla,  I am pleased to inform you that the biopsies taken during your recent endoscopic examination did not show any evidence of cancer upon pathologic examination.  Additional information/recommendations:  __No further action is needed at this time.  Please  follow-up with      your primary care physician for your other healthcare needs.  __ Please call [phone] to schedule a return visit to review      your condition.  X__ Continue with the treatment plan as outlined on the day of your      exam.  __ You  should have a repeat endoscopic examination for this problem              in _ months/years.   Please call us if you are having persistent problems or have questions about your condition that have not been fully answered at this time.  Sincerely,  Mardella Layman MD [facility]  This letter has been electronically signed by your physician.  This report was created from the original endoscopy report, which was reviewed and signed by the above listed endoscopist.   Appended Document: Orders Update    Clinical Lists Changes  Problems: Added new problem of HIATAL HERNIA WITH REFLUX (ICD-553.3) Orders: Added new Test order of [facility] ([facility]) - Signed

## 2010-08-12 NOTE — Progress Notes (Signed)
Summary: B12 concerns  Phone Note Call from Patient Call back at [phone]   Caller: Patient Call For: Dr. Jarold Motto Reason for Call: Talk to Nurse Summary of Call: pt thinks his B12 level was low b/c of all the meds he was on for GERD... doesnt think he needs to have B12 injections... has chosen to take another form of B12 and would also like to make sure the dosage is sufficient Initial call taken by: Vallarie Mare,  August 05, 2009 11:10 AM  Follow-up for Phone Call        Pt can not afford to take the nascobal.  He would like to try SL B12.   Pt found OTC tabs which contain 2500 micrograms of B12.  Pt would like to try this for a while and then recheck B12 level and see if this will maintain  levels.  If Dr. Jarold Motto fees he needs to take more than 2500 micrograms daily he will do so.    Additional Follow-up for Phone Call Additional follow up Details #1::        SHOTS ARE OK.. Additional Follow-up by: Mardella Layman MD FACG,  August 05, 2009 11:47 AM    Additional Follow-up for Phone Call Additional follow up Details #2::    Talked with pt.  He insists on trying the SL B12.  HE does not want to take shots for the rest of his life if not needed.  Pt will sch Follow up appt in 3 mo to reevaluate. Follow-up by: Ashok Cordia RN,  August 06, 2009 4:47 PM

## 2010-08-12 NOTE — Progress Notes (Signed)
Summary: impaction cleared  Phone Note Call from Patient   Caller: Patient Summary of Call: Food impaction cleared after coughing. advised to drink liquids tonight soft foods over weekend. we will contact him for follow-up with Dr. Jarold Motto about dysphagia, has had some intermittently since fundoplication Initial call taken by: Iva Boop MD, Clementeen Graham,  July 19, 2009 8:23 PM     Appended Document: impaction cleared Appt scheduled.

## 2010-08-12 NOTE — Letter (Signed)
Summary: [facility]   Imported By: Sherian Rein 10/04/2009 14:18:09  _____________________________________________________________________  External Attachment:    Type:   Image     Comment:   External Document

## 2010-08-12 NOTE — Assessment & Plan Note (Signed)
Summary: Dysphagia/dfs   History of Present Illness Visit Type: Follow-up Visit Primary GI MD: Sheryn Bison MD FACP FAGA Primary Provider: Theressa Millard, MD Chief Complaint: Patient comes to f/u on 1 episode of dysphagia which occurred 3 weeks ago. He states that he got choked on a piece of chicken. However, he has had no further episodes of dysphagia. He also denies any other GI problems at this time. History of Present Illness:   This patient had fundoplication surgery by Dr. Luretha Murphy in October. He has done well postop but had an episode of solid food dysphagia recently which resolved spontaneously. He currently is asymptomatic. As part of his workup he was found to have B12 deficiency and has received parenteral IM replacement. He does not have chronic anemia or any neurological problems. He does take a daily aspirin tablet and Flomax.He previously was unable to take proton pump inhibitors because of severe recurrent diarrhea.   GI Review of Systems    Reports bloating and  dysphagia with solids.      Denies abdominal pain, acid reflux, belching, chest pain, dysphagia with liquids, heartburn, loss of appetite, nausea, vomiting, vomiting blood, weight loss, and  weight gain.        Denies anal fissure, black tarry stools, change in bowel habit, constipation, diarrhea, diverticulosis, fecal incontinence, heme positive stool, hemorrhoids, irritable bowel syndrome, jaundice, light color stool, liver problems, rectal bleeding, and  rectal pain.    Current Medications (verified): 1)  Saw Palmetto Complex  Caps (Zn-Pyg Afri-Nettle-Saw Palmet) .[place] Kitchen.. 1 By Mouth Once Daily 2)  Eye Vitamins  Caps (Multiple Vitamins-Minerals) .[place] Kitchen.. 1 Capsule By Mouth Once Daily 3)  Cod Liver Oil 1000 Mg Caps (Cod Liver Oil) .Mar[place] Kitchen.. 1 Capsule By Mouth Once Daily 4)  Grape Seed Extract 100 Mg Caps (Grape Seed) .[place] Kitchen.. 1 Capsule By Mouth Once Daily 5)  Flax Seed Oil 1000 Mg Caps (Flaxseed (Linseed)) .[place] Kitchen.. 1 Capsule By  Mouth Once Daily 6)  Glucosamine-Chondroitin  Caps (Glucosamine-Chondroit-Vit C-Mn) .Mar[place] Kitchen.. 1 Capsule By Mouth Two Times A Day 7)  Msm 1000 Mg Caps (Methylsulfonylmethane) .[place] Kitchen.. 1 Capsule By Mouth Once Daily 8)  Flomax 0.4 Mg Xr24h-Cap (Tamsulosin Hcl) .[place] Kitchen.. 1 Tablet By Mouth Once Daily 9)  Benefiber  Powd (Wheat Dextrin) .... One Tablespoon in Water/cereal in Morning 10)  Folic Acid 1 Mg Tabs (Folic Acid) .... Take One By Mouth Once Daily 11)  Aspirin Ec 325 Mg Tbec (Aspirin) .... Take 1 Tablet By Mouth Every Other Day  Allergies (verified): No Known Drug Allergies  Past History:  Past medical, surgical, family and social histories (including risk factors) reviewed for relevance to current acute and chronic problems.  Past Medical History: Reviewed history from 12/28/2008 and no changes required. Current Problems:   RENAL CALCULUS (ICD-592.0) DIVERTICULOSIS, COLON (ICD-562.10) GERD (ICD-530.81)  Past Surgical History: Right shoulder surgery Tummy Tuck  Family History: Reviewed history from 12/27/2008 and no changes required. Family History of Prostate Cancer:uncle No FH of Colon Cancer: Leukemia - grandmother Melanoma - Brother  Social History: Reviewed history from 12/28/2008 and no changes required. Retired Patient is a former smoker. -stopped 45 years ago marrried Daily Caffeine Use -rare Alcohol Use - yes-1 glass daily  Review of Systems  The patient denies allergy/sinus, anemia, anxiety-new, arthritis/joint pain, back pain, blood in urine, breast changes/lumps, change in vision, confusion, cough, coughing up blood, depression-new, fainting, fatigue, fever, headaches-new, hearing problems, heart murmur, heart rhythm changes, itching, menstrual pain, muscle pains/cramps, night sweats, nosebleeds, pregnancy symptoms,  shortness of breath, skin rash, sleeping problems, sore throat, swelling of feet/legs, swollen lymph glands, thirst - excessive , urination - excessive ,  urination changes/pain, urine leakage, vision changes, and voice change.    Vital Signs:  Patient profile:   77 year old male Height:      72 inches Weight:      205.13 pounds BMI:     27.92 BSA:     2.16 Pulse rate:   76 / minute Pulse rhythm:   regular BP sitting:   110 / 58  (right arm)  Vitals Entered By: Hortense Ramal CMA Duncan Dull) (August 02, 2009 10:55 AM)  Physical Exam  General:  Well developed, well nourished, no acute distress.healthy appearing.   Psych:  Alert and cooperative. Normal mood and affect.   Impression & Recommendations:  Problem # 1:  DYSPHAGIA (ZOX-096.04) Assessment Improved I cannot see any need for endoscopy at this time. If his dysphagia returns or worsens we will proceed with endoscopy and probable in period dilations. He has done extremely well postop with his fundoplication and we will continue to follow him as needed.  Problem # 2:  VITAMIN B12 DEFICIENCY (ICD-266.2) Assessment: Improved Nasal weekly B12 spray recommended.  Problem # 3:  BENIGN PROSTATIC HYPERTROPHY, WITH OBSTRUCTION (ICD-600.01) Assessment: Comment Only  Patient Instructions: 1)  Copy sent to : Dr. Luretha Murphy in surgery and Dr. Theressa Millard and primary care 2)  Please continue current medications.  3)  Please schedule a follow-up appointment as needed.  4)  Weekly nasal B12 gel recommended, Rx sent to pharmacy. 5)  Information re B12 and discount information given to pt. 6)  The medication list was reviewed and reconciled.  All changed / newly prescribed medications were explained.  A complete medication list was provided to the patient / caregiver. Prescriptions: NASCOBAL 500 MCG/0.1ML SOLN (CYANOCOBALAMIN) One spray once a week  #1 x 6   Entered by:   Ashok Cordia RN   Authorized by:   Mardella Layman MD [facility]   Signed by:   Ashok Cordia RN on 08/02/2009   Method used:   Electronically to        [phone] (retail)       [address]  [place], [place]  [address]       Ph: [phone] or [phone]       Fax: [phone]   RxID:   [id number]

## 2010-08-12 NOTE — Progress Notes (Signed)
Summary: Food impaction  Phone Note Call from Patient   Caller: Patient Summary of Call: has a food impaction told to come to [facility] ED Initial call taken by: Iva Boop MD, Clementeen Graham,  July 19, 2009 7:49 PM

## 2010-08-12 NOTE — Progress Notes (Signed)
Summary: med too expensive  Phone Note Call from Patient Call back at Home Phone [phone]   Caller: Patient Call For: Jarold Motto Reason for Call: Talk to Nurse Summary of Call: Patient states that the meds given to him (b12 nasal spray) is too expensive, wants to continue to do the shots once a month. Initial call taken by: Tawni Levy [facility],  August 02, 2009 12:30 PM  Follow-up for Phone Call        Pt states Nascobal will cost him over $55 per month even with the discount card.  Pt wants to resume injections.  Appt made for B12 injection next week. Follow-up by: Ashok Cordia RN,  August 02, 2009 1:54 PM

## 2010-08-12 NOTE — Progress Notes (Signed)
Summary: Triage  Phone Note Call from Patient   Caller: Patient Call For: Dr. Jarold Motto Reason for Call: Talk to Nurse Summary of Call: Pt wanted to inform you that he has ordered some B12 - 5mg . Sublingual Methylcobalamin that he will be taking Initial call taken by: Karna Christmas,  August 06, 2009 9:16 AM  Follow-up for Phone Call        See phone note from 08/05/09 re B12. Follow-up by: Ashok Cordia RN,  August 06, 2009 4:42 PM

## 2010-08-28 ENCOUNTER — Emergency Department (HOSPITAL_COMMUNITY): Payer: Medicare Other

## 2010-08-28 ENCOUNTER — Emergency Department (HOSPITAL_COMMUNITY)
Admission: EM | Admit: 2010-08-28 | Discharge: 2010-08-28 | Disposition: A | Discharge status: Home / Self Care | Payer: Medicare Other | Attending: Emergency Medicine | Admitting: Emergency Medicine

## 2010-08-28 DIAGNOSIS — N390 Urinary tract infection, site not specified: Secondary | ICD-10-CM | POA: Insufficient documentation

## 2010-08-28 DIAGNOSIS — Z7982 Long term (current) use of aspirin: Secondary | ICD-10-CM | POA: Insufficient documentation

## 2010-08-28 DIAGNOSIS — M25519 Pain in unspecified shoulder: Secondary | ICD-10-CM | POA: Insufficient documentation

## 2010-08-28 DIAGNOSIS — R4182 Altered mental status, unspecified: Secondary | ICD-10-CM | POA: Insufficient documentation

## 2010-08-28 DIAGNOSIS — R079 Chest pain, unspecified: Secondary | ICD-10-CM | POA: Insufficient documentation

## 2010-08-28 DIAGNOSIS — R55 Syncope and collapse: Secondary | ICD-10-CM | POA: Insufficient documentation

## 2010-08-28 LAB — COMPREHENSIVE METABOLIC PANEL
ALT: 16 U/L (ref 0–53)
AST: 23 U/L (ref 0–37)
CO2: 26 mEq/L (ref 19–32)
Calcium: 9.2 mg/dL (ref 8.4–10.5)
Chloride: 105 mEq/L (ref 96–112)
GFR calc non Af Amer: >60 mL/min (ref >60)
Glucose, Bld: 98 mg/dL (ref 70–99)
Sodium: 140 mEq/L (ref 135–145)
Total Bilirubin: 0.5 mg/dL (ref 0.3–1.2)

## 2010-08-28 LAB — DIFFERENTIAL
Basophils Absolute: 0.1 10*3/uL (ref 0.0–0.1)
Eosinophils Absolute: 0.2 10*3/uL (ref 0.0–0.7)
Eosinophils Relative: 2 % (ref 0–5)
Lymphocytes Relative: 18 % (ref 12–46)
Monocytes Absolute: 1.3 10*3/uL — ABNORMAL HIGH (ref 0.1–1.0)

## 2010-08-28 LAB — URINALYSIS, ROUTINE W REFLEX MICROSCOPIC
Bilirubin Urine: NEGATIVE
Hgb urine dipstick: NEGATIVE
Ketones, ur: NEGATIVE mg/dL
Nitrite: NEGATIVE
Protein, ur: NEGATIVE mg/dL
Specific Gravity, Urine: 1.015 (ref 1.005–1.030)
Urobilinogen, UA: 1 mg/dL (ref 0.0–1.0)

## 2010-08-28 LAB — URINE MICROSCOPIC-ADD ON

## 2010-08-28 LAB — CBC
MCH: 33 pg (ref 26.0–34.0)
MCV: 94.6 fL (ref 78.0–100.0)

## 2010-08-28 LAB — POCT CARDIAC MARKERS
CKMB, poc: 3.5 ng/mL (ref 1.0–8.0)
Myoglobin, poc: 127 ng/mL (ref 12–200)
Troponin i, poc: <0.05 ng/mL (ref 0.00–0.09)

## 2010-08-28 LAB — GLUCOSE, CAPILLARY: Glucose-Capillary: 92 mg/dL (ref 70–99)

## 2010-08-28 IMAGING — CT CT HEAD W/O CM
1 of 2 series · 16 of 30 positions shown, 20 images · non-contrast
Comparison: None.

CLINICAL DATA: Chest pain.  Syncope.

CT HEAD WITHOUT CONTRAST
TECHNIQUE: Contiguous axial images were obtained from the base of
the skull through the vertex without contrast.

[Series 3: recon 2: brain · axial · 0.49mm/px · z∈[+142,+284]mm · 16 of 64 slices shown, 20 images]
[im 4/64  brain]
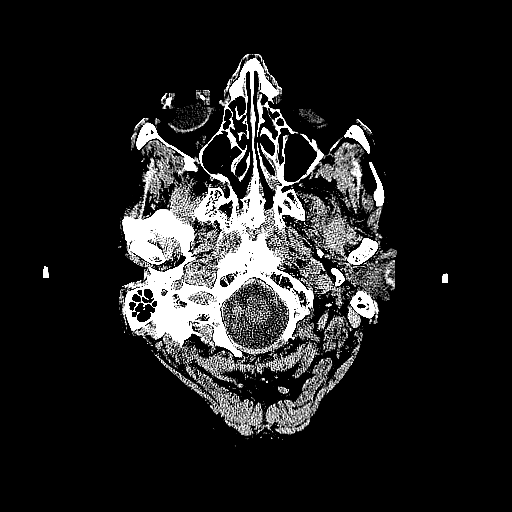
[im 4/64  bone]
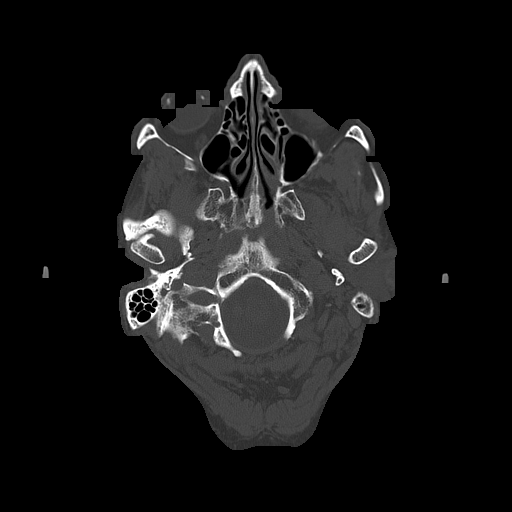
[im 7/64  brain]
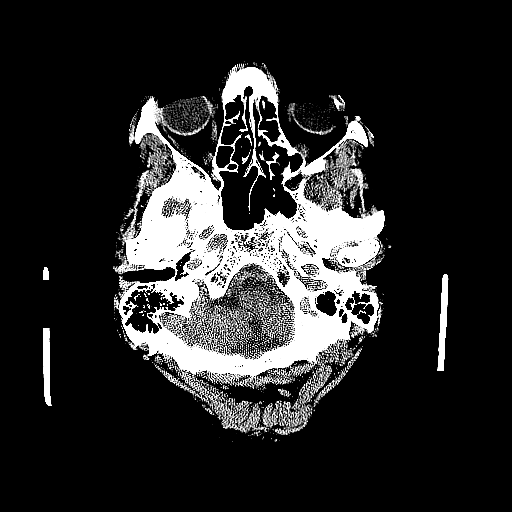
[im 10/64  brain]
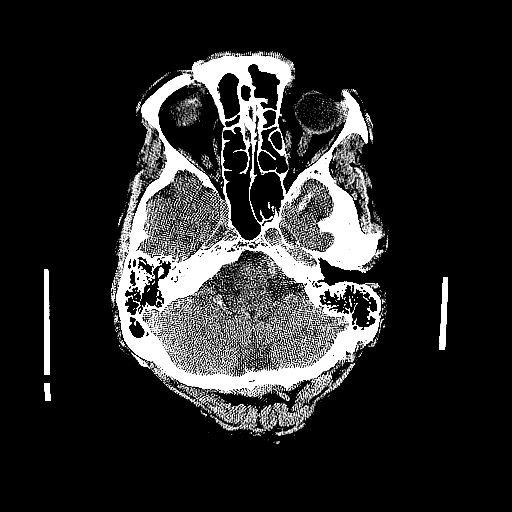
[im 14/64  brain]
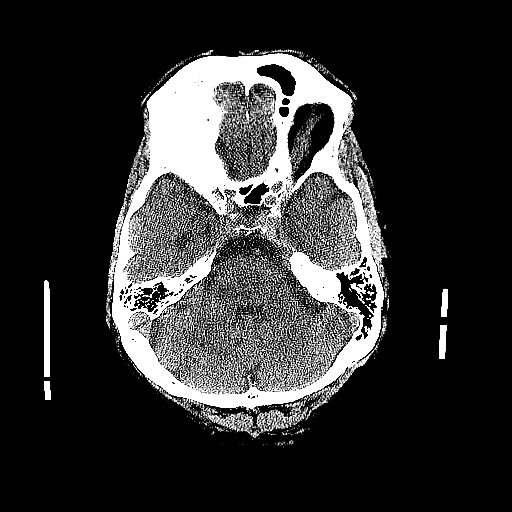
[im 20/64  brain]
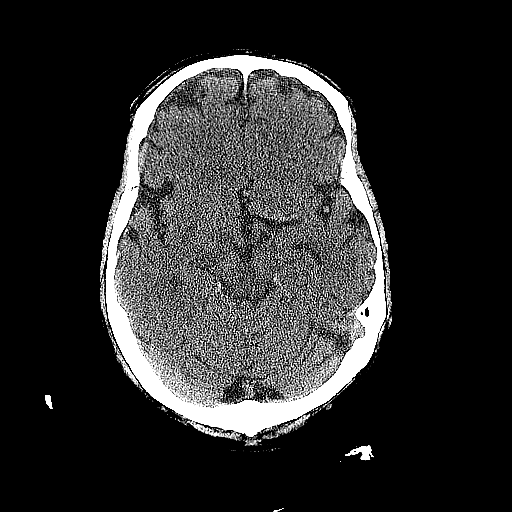
[im 20/64  bone]
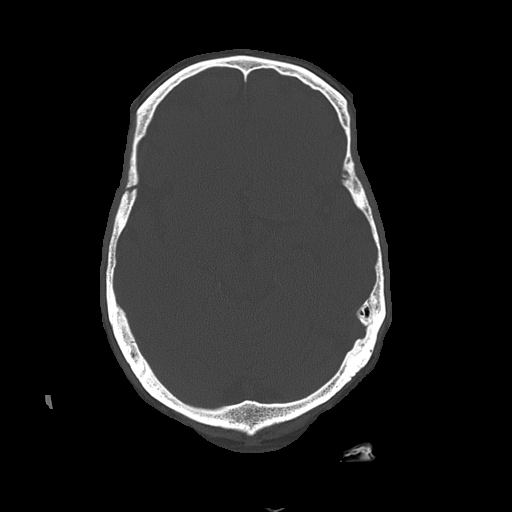
[im 24/64  brain]
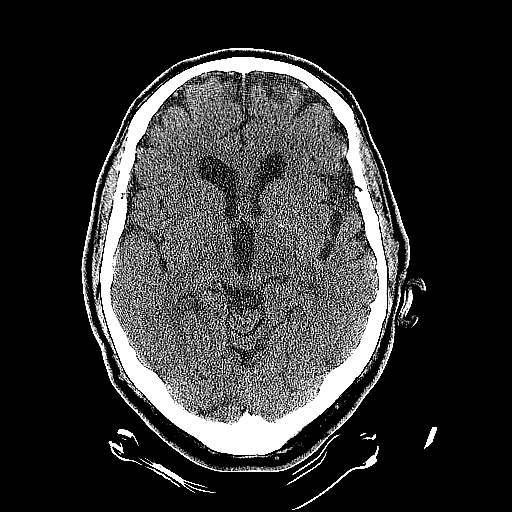
[im 27/64  brain]
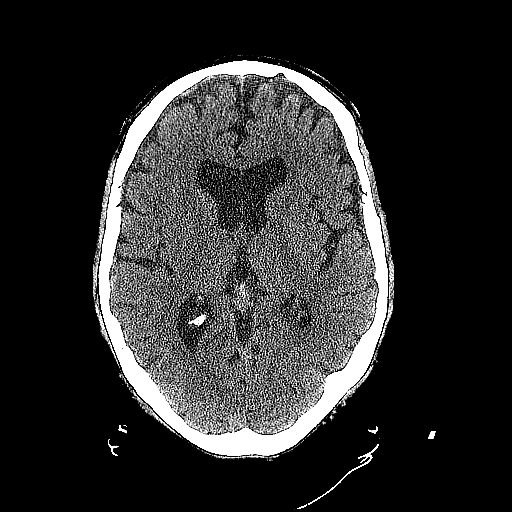
[im 30/64  brain]
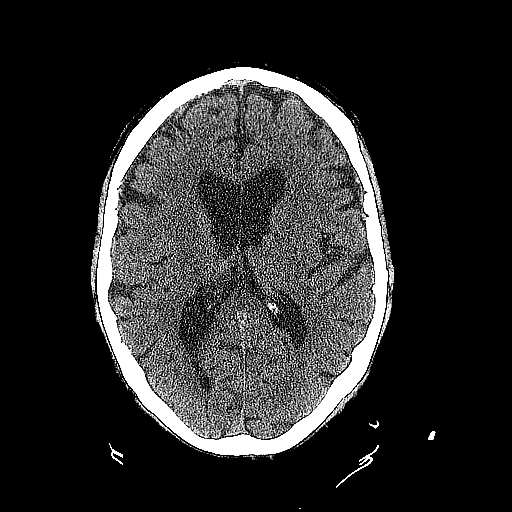
[im 34/64  brain]
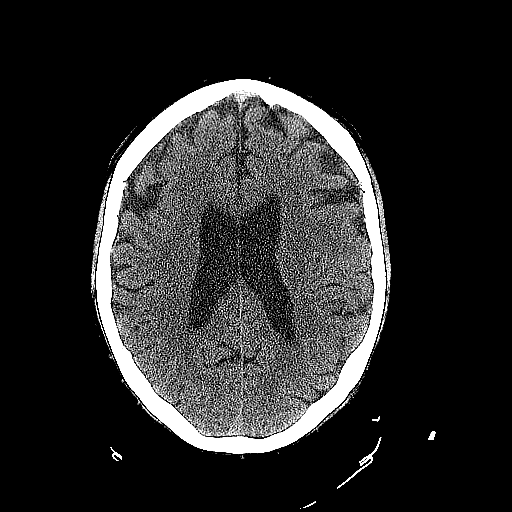
[im 34/64  bone]
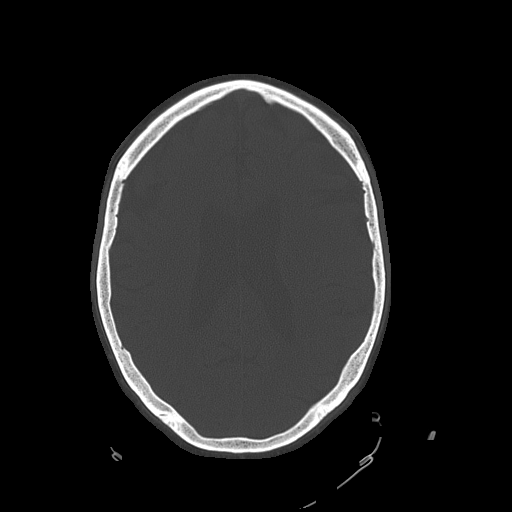
[im 37/64  brain]
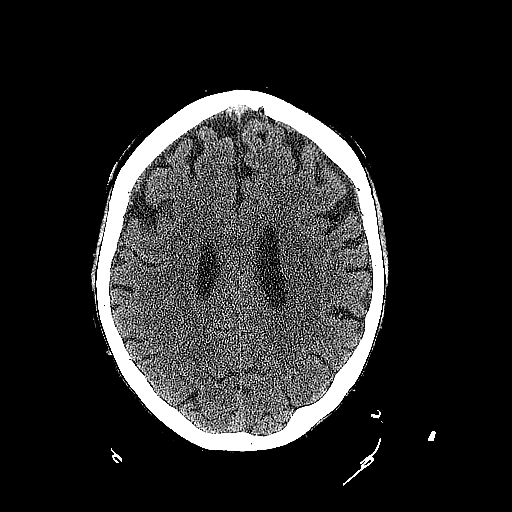
[im 40/64  brain]
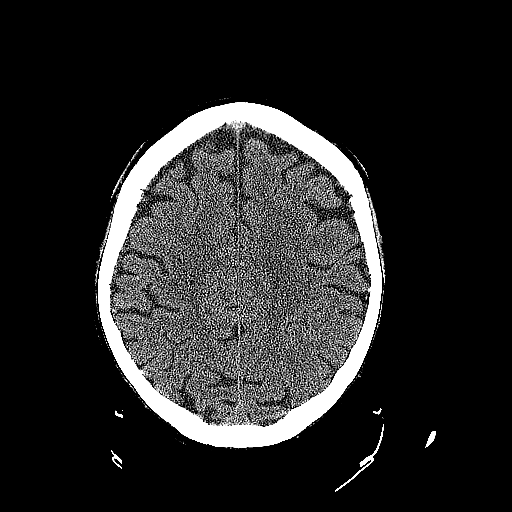
[im 44/64  brain]
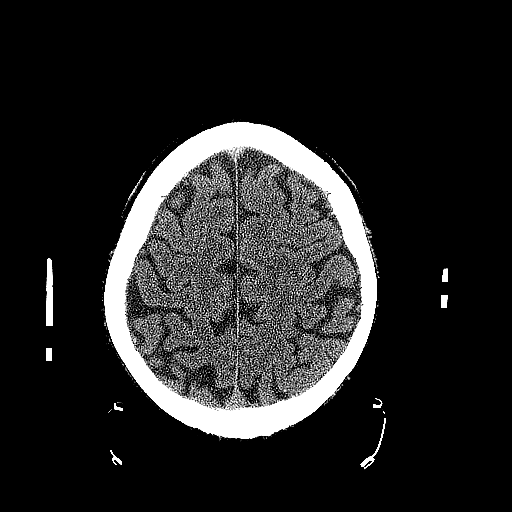
[im 50/64  brain]
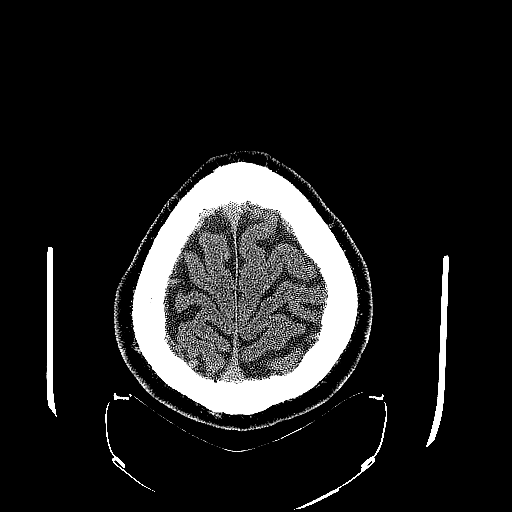
[im 50/64  bone]
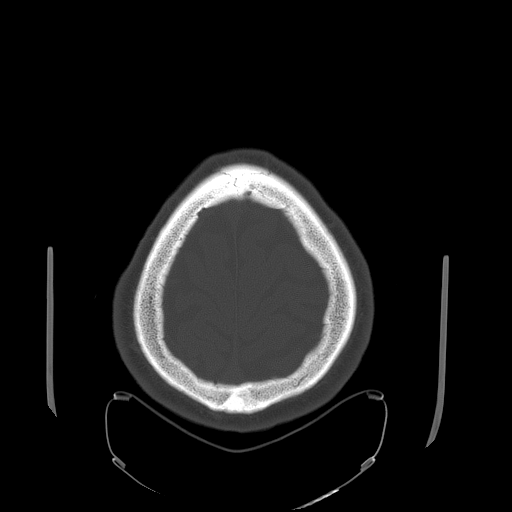
[im 54/64  brain]
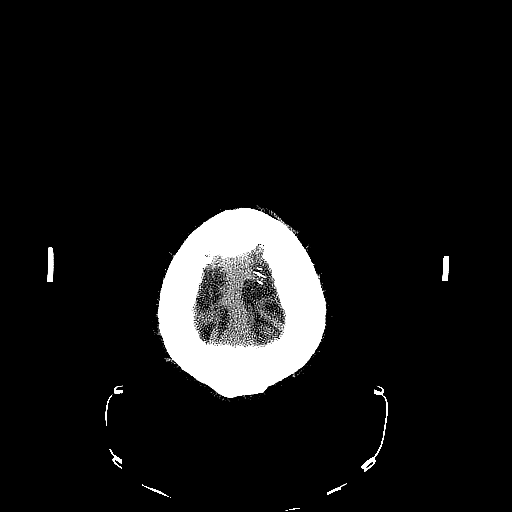
[im 57/64  brain]
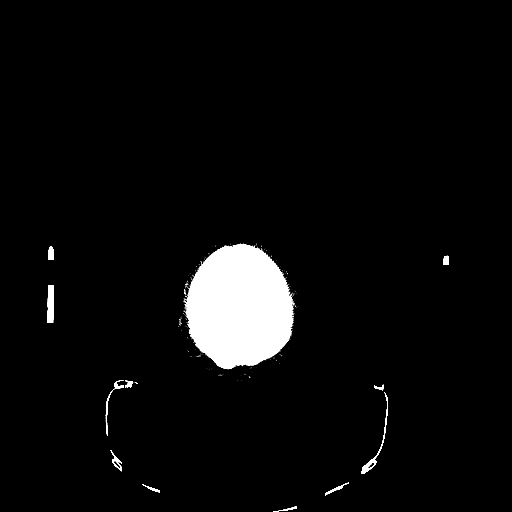
[im 60/64  brain]
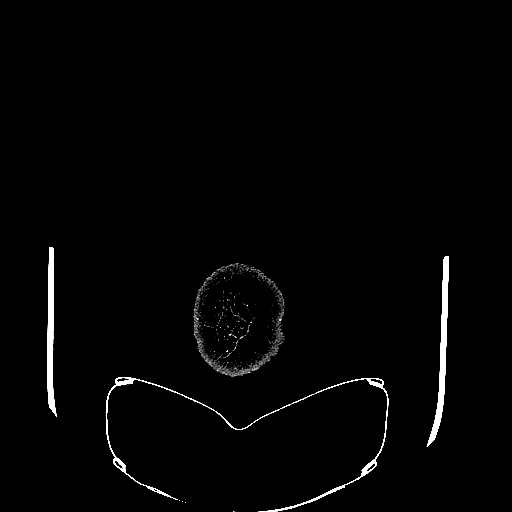

[16 of 30 positions shown; findings below may reference images not displayed]

FINDINGS: The brain is mildly atrophic with some chronic
microvascular ischemic change.  No evidence of acute intracranial
abnormality including acute infarction, hemorrhage, mass lesion,
mass effect, midline shift or abnormal extra-axial fluid
collection.  No hydrocephalus.  Calvarium intact.  Imaged paranasal
sinuses and mastoid air cells clear.
IMPRESSION: No acute finding.

## 2010-10-16 LAB — CBC
HCT: 36.4 % — ABNORMAL LOW (ref 39.0–52.0)
HCT: 41.6 % (ref 39.0–52.0)
Hemoglobin: 14.3 g/dL (ref 13.0–17.0)
MCHC: 34.1 g/dL (ref 30.0–36.0)
MCHC: 34.3 g/dL (ref 30.0–36.0)
MCV: 95.8 fL (ref 78.0–100.0)
MCV: 96.4 fL (ref 78.0–100.0)
Platelets: 179 10*3/uL (ref 150–400)
RBC: 4.34 MIL/uL (ref 4.22–5.81)
WBC: 11 10*3/uL — ABNORMAL HIGH (ref 4.0–10.5)
WBC: 6.2 10*3/uL (ref 4.0–10.5)

## 2010-11-25 NOTE — Op Note (Signed)
NAME:  Lucas Shepherd, Lucas Shepherd NO.:  [id number]   MEDICAL RECORD NO.:  [id number]          PATIENT TYPE:  AMB   LOCATION:  [facility]                         FACILITY:  [facility]   PHYSICIAN:  Bertram Millard. Dahlstedt, M.D.DATE OF BIRTH:  01/25/1929   DATE OF PROCEDURE:  08/23/2007  DATE OF DISCHARGE:                               OPERATIVE REPORT   PREOPERATIVE DIAGNOSIS:  Persistent right ureteral stone.   POSTOPERATIVE DIAGNOSIS:  Persistent right ureteral stone.   PROCEDURE:  Cysto, right retrograde ureteral pyelogram, right  ureteroscopic stone extraction.   SURGEON:  Bertram Millard. Dahlstedt, M.D.   ANESTHESIA:  General with LMA.   COMPLICATIONS:  None.   BRIEF HISTORY:  A 77 year old male with persistent right flank pain over  the past few weeks.  He has a diagnosis of 5 mm right distal ureteral  stone.  He presented for his third time with acute pain this morning.  He desires to have the stone removed.  Risks and complications of  ureteroscopic stone extraction were discussed with the patient.  He  understands these and desires to proceed.   DESCRIPTION OF PROCEDURE:  The patient was administered preoperative IV  antibiotics, the surgical site was marked and he was identified in the  holding area.  He was taken to the operating room where general  anesthetic was administered using the LMA.  He was placed in the dorsal  lithotomy position.  Genitalia and perineum were prepped and draped.  Time out was called.   A 22 French panendoscope was advanced into his bladder.  Moderate  trabeculations.  There was a diverticulum just adjacent to the right  ureteral orifice.  Retrograde pyelogram was performed.   The retrograde pyelogram revealed a filling defect which moved up and  down the ureter - mainly in the distal ureter, however.  It was  approximately 5-6 mm in size.  There was mild dilatation of the ureter  near this.  A guidewire was placed and a ureteroscope was advanced  into  the ureter.  About half to two-thirds the way up the ureter a stone was  encountered.  It was grasped with a Nitinol basket and extracted.  It  was approximately 5 mm in size.  No other stones were seen.  At this  point the bladder was decompressed and the procedure terminated.  The  patient tolerated  the procedure well.  He was awakened and taken to the PACU in stable  condition.  He will follow up in approximately 1 week.   DISCHARGE MEDICATIONS:  Include Percocet and Cipro one twice daily for 3  days.      Bertram Millard. Dahlstedt, M.D.  Electronically Signed     SMD/MEDQ  D:  08/23/2007  T:  08/24/2007  Job:  [id number]

## 2011-04-02 LAB — URINALYSIS, ROUTINE W REFLEX MICROSCOPIC
Bilirubin Urine: NEGATIVE
Ketones, ur: NEGATIVE
Nitrite: NEGATIVE
Protein, ur: 30 — AB
pH: 6

## 2011-04-02 LAB — DIFFERENTIAL
Eosinophils Relative: 2
Lymphocytes Relative: 18
Monocytes Absolute: 1.2 — ABNORMAL HIGH
Monocytes Relative: 11
Neutro Abs: 6.9

## 2011-04-02 LAB — BASIC METABOLIC PANEL
Chloride: 100
GFR calc non Af Amer: >60
Glucose, Bld: 110 — ABNORMAL HIGH
Potassium: 3.7
Sodium: 138

## 2011-04-02 LAB — CBC
HCT: 44.9
Hemoglobin: 15.5
RBC: 4.81

## 2011-04-02 LAB — URINE MICROSCOPIC-ADD ON

## 2011-05-26 ENCOUNTER — Other Ambulatory Visit: Payer: Self-pay | Admitting: Orthopedic Surgery

## 2011-05-27 ENCOUNTER — Ambulatory Visit
Admission: RE | Admit: 2011-05-27 | Discharge: 2011-05-27 | Disposition: A | Discharge status: Home / Self Care | Payer: Medicare Other | Source: Ambulatory Visit | Attending: Anesthesiology | Admitting: Anesthesiology

## 2011-05-27 ENCOUNTER — Other Ambulatory Visit (HOSPITAL_BASED_OUTPATIENT_CLINIC_OR_DEPARTMENT_OTHER): Payer: Self-pay | Admitting: Anesthesiology

## 2011-05-27 ENCOUNTER — Encounter (HOSPITAL_BASED_OUTPATIENT_CLINIC_OR_DEPARTMENT_OTHER): Payer: Self-pay | Admitting: *Deleted

## 2011-05-27 ENCOUNTER — Encounter (HOSPITAL_BASED_OUTPATIENT_CLINIC_OR_DEPARTMENT_OTHER)
Admission: RE | Admit: 2011-05-27 | Discharge: 2011-05-27 | Disposition: A | Discharge status: Home / Self Care | Payer: Medicare Other | Source: Ambulatory Visit | Attending: Orthopedic Surgery | Admitting: Orthopedic Surgery

## 2011-05-27 IMAGING — CR DG CHEST 2V
2 series · 2 of 2 positions shown · non-contrast
Comparison: None.

CLINICAL DATA: Preoperative respiratory exam.  Shoulder spurs.

CHEST - 2 VIEW

[view not recorded (1 of 2)]
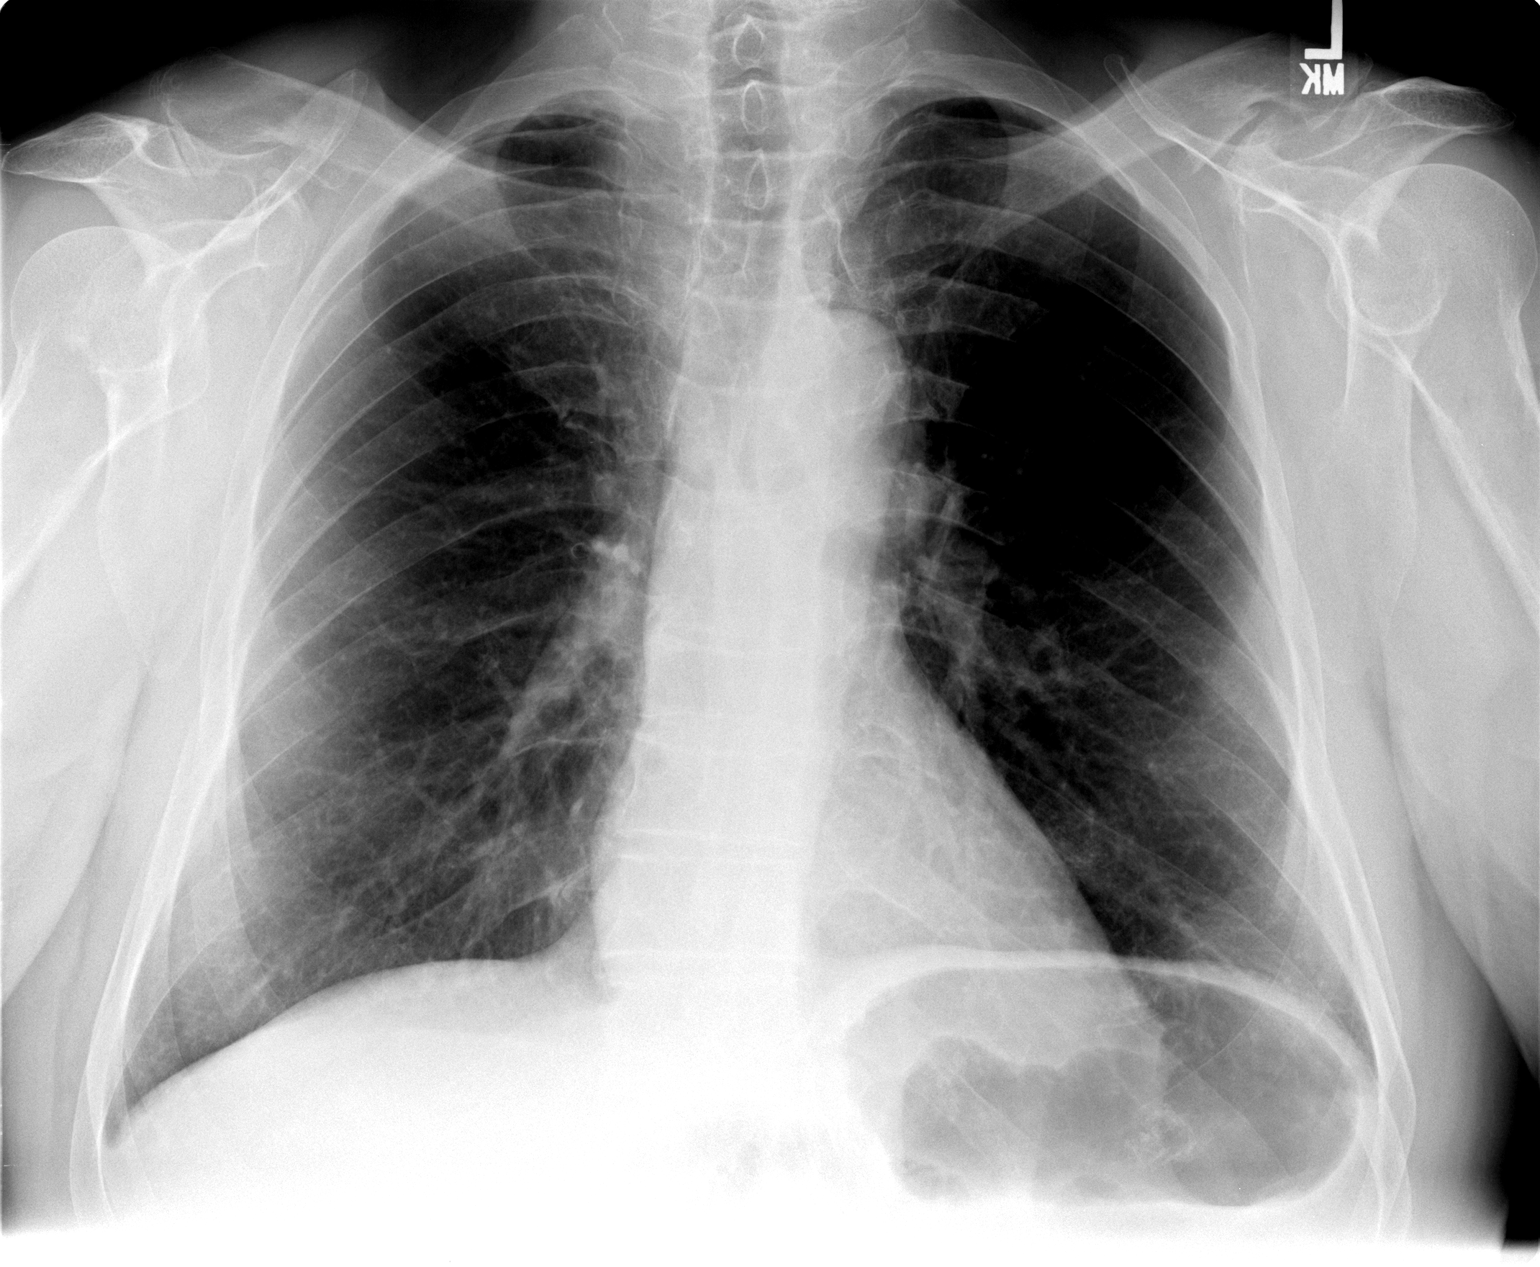

[view not recorded (2 of 2)]
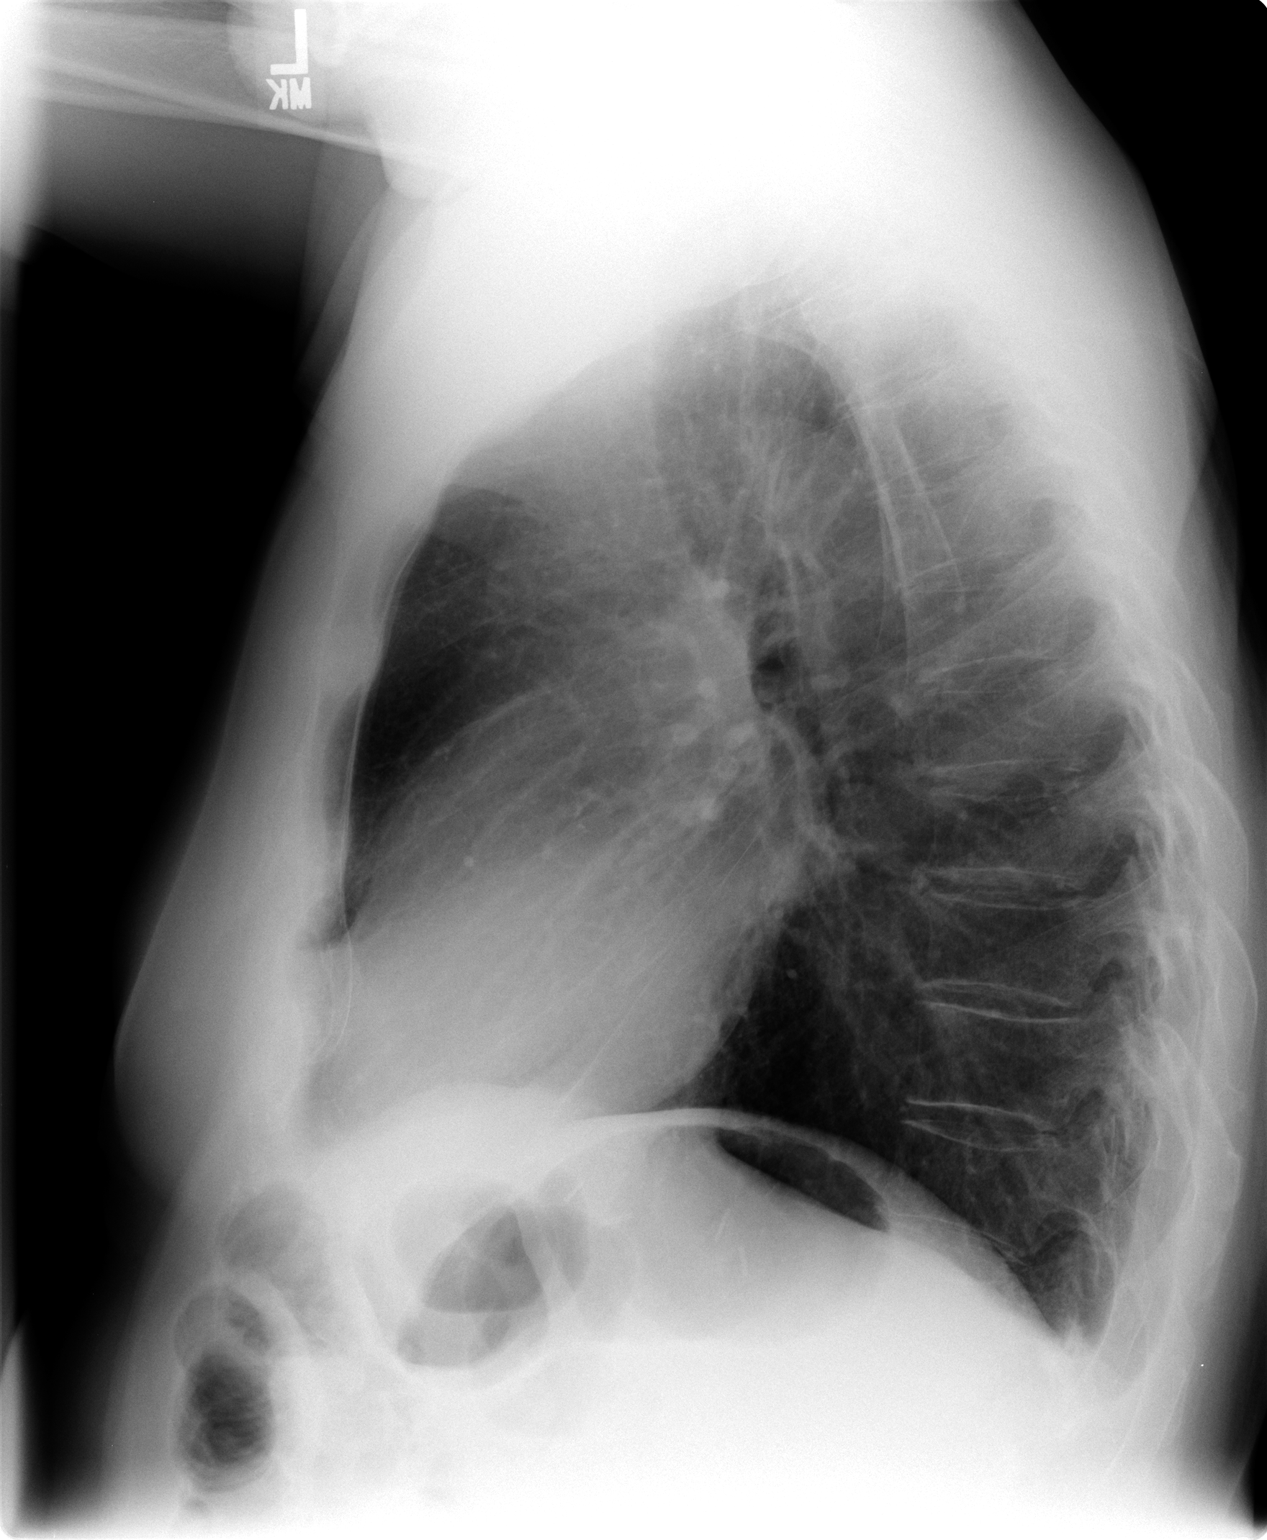

[2 of 2 positions shown; findings below may reference images not displayed]

FINDINGS: The heart size and vascularity are normal and the lungs
are clear.  No significant osseous abnormality.  No effusions.
IMPRESSION: Normal chest.

## 2011-05-27 NOTE — Progress Notes (Signed)
To bring all rx meds Overnight bag Ins cards

## 2011-05-27 NOTE — H&P (Signed)
  Lucas Shepherd is an 77 y.o. male.   Chief Complaint: c/o progressive left shoulder pain. HPI: Pt. Is a well known 77 y/o right hand dominant male who we have followed for several years for chronic and slowly progressive left shoulder pain.MRI performed 2009 revealed non-retracted R.C. tear of the supraspinatus, subscapularis, a SLAP lesion and marked A/C arthrosis. He recently presented to our office c/o progressive symptoms and  would like to undergo surgical intervention.   Past Medical History  Diagnosis Date  . GERD (gastroesophageal reflux disease)     nisseum fundoplication 2010  . Anemia   . BPH (benign prostatic hyperplasia)     Past Surgical History  Procedure Date  . Nissen fundoplication 2010  . Shoulder arthroscopy 1954    rt  . Abdominal surgery 1985    tummy tuc  . Colonoscopy     History reviewed. No pertinent family history. Social History:  reports that he quit smoking about 45 years ago. He does not have any smokeless tobacco history on file. He reports that he drinks about .6 ounces of alcohol per week. He reports that he does not use illicit drugs.  Allergies: No Known Allergies  No current facility-administered medications on file as of .   Medications Prior to Admission  Medication Sig Dispense Refill  . aspirin 325 MG EC tablet Take 325 mg by mouth every other day.        Marland Kitchen glucosamine-chondroitin 500-400 MG tablet Take 1 tablet by mouth 2 (two) times daily between meals.        . multivitamin-lutein (OCUVITE-LUTEIN) CAPS Take 1 capsule by mouth daily.        . saw palmetto 160 MG capsule Take 160 mg by mouth 1 day or 1 dose.        . Tamsulosin HCl (FLOMAX) 0.4 MG CAPS Take 0.4 mg by mouth Nightly.          No results found for this or any previous visit (from the past 48 hour(s)).  Dg Chest 2 View  05/27/2011  *RADIOLOGY REPORT*  Clinical Data: Preoperative respiratory exam.  Shoulder spurs.  CHEST - 2 VIEW  Comparison: None.  Findings: The  heart size and vascularity are normal and the lungs are clear.  No significant osseous abnormality.  No effusions.  IMPRESSION: Normal chest.  Original Report Authenticated By: Gwynn Burly, M.D.     Pertinent items are noted in HPI.  Height 6' (1.829 m), weight 92.987 kg (205 lb).  General appearance: alert Head: Normocephalic, without obvious abnormality Neck: supple, symmetrical, trachea midline Resp: clear to auscultation bilaterally Cardio: regular rate and rhythm, S1, S2 normal, no murmur, click, rub or gallop GI: normal findings: bowel sounds normal Extremities:Left shoulder reveals pain with forward flexion, abduction and internal rotation behind his back. He has pain with cross-body adduction. Pulses: 2+ and symmetric Skin: Skin color, texture, turgor normal. No rashes or lesions Neurologic: Grossly normal    Assessment/Plan Impression: Left shoulder impingement, A/C arthrosis and RC tear. Plan: To OR for Left shoulder scope with SAD/DCR, debridement and RC tear repair as needed.  Buck Mcaffee JR,Pietro Bonura V 05/27/2011, 3:28 PM

## 2011-05-27 NOTE — Progress Notes (Signed)
Going to get cxr pcp-dr osborne,james Arrive with wife [phone]

## 2011-05-27 NOTE — H&P (Signed)
  April 03, 2011   Theressa Millard, MD Fax [phone]  RE: Lucas Shepherd DOB: 2028/12/22 [employer]   Dear [name]:  Lucas Shepherd returns for follow up evaluation of his left shoulder. He is excited that he shot one stroke less than his age of 81 playing golf this week. He has minimal pain when he plays golf and cannot sleep on the left shoulder at night. He has popping and clicking and has difficulty with certain maneuvers reaching behind his back or extending his shoulder.   He has reached the point where he would like to proceed with appropriate treatment of his shoulder. He is going to finish playing golf this fall and in later October or early November would like to proceed with arthroscopic evaluation, subacromial decompression, distal clavicle resection and probable repair of the rotator cuff. The surgery, after care, risks and benefits were described in detail. We may identify biceps tendinopathy and a labral tear at the time of arthroscopy. We will treat him appropriately based on our findings.  I will keep you informed of his progress. Thank you for allowing me to participate in the care of your patients.   Sincerely,    Lucas Shepherd., MD RVS/phe T: 04-07-11

## 2011-05-28 ENCOUNTER — Encounter (HOSPITAL_BASED_OUTPATIENT_CLINIC_OR_DEPARTMENT_OTHER): Payer: Self-pay | Admitting: *Deleted

## 2011-05-28 ENCOUNTER — Encounter (HOSPITAL_BASED_OUTPATIENT_CLINIC_OR_DEPARTMENT_OTHER): Payer: Self-pay | Admitting: Certified Registered"

## 2011-05-28 ENCOUNTER — Encounter (HOSPITAL_BASED_OUTPATIENT_CLINIC_OR_DEPARTMENT_OTHER): Payer: Self-pay | Admitting: Anesthesiology

## 2011-05-28 ENCOUNTER — Encounter (HOSPITAL_BASED_OUTPATIENT_CLINIC_OR_DEPARTMENT_OTHER)
Admission: RE | Disposition: A | Discharge status: Home / Self Care | Payer: Self-pay | Source: Ambulatory Visit | Attending: Orthopedic Surgery

## 2011-05-28 ENCOUNTER — Ambulatory Visit (HOSPITAL_BASED_OUTPATIENT_CLINIC_OR_DEPARTMENT_OTHER)
Admission: RE | Admit: 2011-05-28 | Discharge: 2011-05-29 | Disposition: A | Discharge status: Home / Self Care | Payer: Medicare Other | Source: Ambulatory Visit | Attending: Orthopedic Surgery | Admitting: Orthopedic Surgery

## 2011-05-28 ENCOUNTER — Ambulatory Visit (HOSPITAL_BASED_OUTPATIENT_CLINIC_OR_DEPARTMENT_OTHER): Payer: Medicare Other | Admitting: Anesthesiology

## 2011-05-28 DIAGNOSIS — M67919 Unspecified disorder of synovium and tendon, unspecified shoulder: Secondary | ICD-10-CM | POA: Insufficient documentation

## 2011-05-28 DIAGNOSIS — M9420 Chondromalacia, unspecified site: Secondary | ICD-10-CM | POA: Insufficient documentation

## 2011-05-28 DIAGNOSIS — M25819 Other specified joint disorders, unspecified shoulder: Secondary | ICD-10-CM | POA: Insufficient documentation

## 2011-05-28 DIAGNOSIS — K219 Gastro-esophageal reflux disease without esophagitis: Secondary | ICD-10-CM | POA: Insufficient documentation

## 2011-05-28 DIAGNOSIS — N400 Enlarged prostate without lower urinary tract symptoms: Secondary | ICD-10-CM | POA: Insufficient documentation

## 2011-05-28 DIAGNOSIS — M19019 Primary osteoarthritis, unspecified shoulder: Secondary | ICD-10-CM | POA: Insufficient documentation

## 2011-05-28 DIAGNOSIS — M719 Bursopathy, unspecified: Secondary | ICD-10-CM | POA: Insufficient documentation

## 2011-05-28 HISTORY — DX: Anemia, unspecified: D64.9

## 2011-05-28 HISTORY — PX: SHOULDER ARTHROSCOPY: SHX128

## 2011-05-28 HISTORY — DX: Benign prostatic hyperplasia without lower urinary tract symptoms: N40.0

## 2011-05-28 HISTORY — DX: Gastro-esophageal reflux disease without esophagitis: K21.9

## 2011-05-28 SURGERY — ARTHROSCOPY, SHOULDER, WITH ROTATOR CUFF REPAIR
Anesthesia: General | Site: Shoulder | Laterality: Left | Wound class: Clean

## 2011-05-28 MED ORDER — OXYCODONE-ACETAMINOPHEN 5-325 MG PO TABS: 1.0000 | ORAL_TABLET | ORAL | Status: DC | PRN

## 2011-05-28 MED ORDER — LACTATED RINGERS IV SOLN: INTRAVENOUS | Status: DC

## 2011-05-28 MED ORDER — ONDANSETRON HCL 4 MG/2ML IJ SOLN: 4.0000 mg | Freq: Once | INTRAMUSCULAR | Status: AC | PRN

## 2011-05-28 MED ORDER — PROPOFOL 10 MG/ML IV EMUL
INTRAVENOUS | Status: DC | PRN
  Administered 2011-05-28: 140 mg via INTRAVENOUS

## 2011-05-28 MED ORDER — FENTANYL CITRATE 0.05 MG/ML IJ SOLN
100.0000 ug | Freq: Once | INTRAMUSCULAR | Status: AC
  Administered 2011-05-28: 50 ug via INTRAVENOUS

## 2011-05-28 MED ORDER — LIDOCAINE HCL (CARDIAC) 20 MG/ML IV SOLN
INTRAVENOUS | Status: DC | PRN
  Administered 2011-05-28: 40 mg via INTRAVENOUS

## 2011-05-28 MED ORDER — CEFAZOLIN SODIUM 1-5 GM-% IV SOLN
1.0000 g | Freq: Four times a day (QID) | INTRAVENOUS | Status: AC
  Administered 2011-05-28 – 2011-05-29 (×3): 1 g via INTRAVENOUS

## 2011-05-28 MED ORDER — ONDANSETRON HCL 4 MG/2ML IJ SOLN: 4.0000 mg | Freq: Four times a day (QID) | INTRAMUSCULAR | Status: DC | PRN

## 2011-05-28 MED ORDER — GLYCOPYRROLATE 0.2 MG/ML IJ SOLN
INTRAMUSCULAR | Status: DC | PRN
  Administered 2011-05-28: 0.2 mg via INTRAVENOUS

## 2011-05-28 MED ORDER — TAMSULOSIN HCL 0.4 MG PO CAPS
0.4000 mg | ORAL_CAPSULE | Freq: Every evening | ORAL | Status: DC
  Administered 2011-05-28: 0.4 mg via ORAL

## 2011-05-28 MED ORDER — HYDROCODONE-ACETAMINOPHEN 5-325 MG PO TABS: 1.0000 | ORAL_TABLET | ORAL | Status: DC | PRN

## 2011-05-28 MED ORDER — HYDROMORPHONE HCL PF 1 MG/ML IJ SOLN: 0.5000 mg | INTRAMUSCULAR | Status: DC | PRN

## 2011-05-28 MED ORDER — LACTATED RINGERS IV SOLN
INTRAVENOUS | Status: DC
  Administered 2011-05-28: 18:00:00 via INTRAVENOUS
  Administered 2011-05-28: 20 mL/h via INTRAVENOUS
  Administered 2011-05-28: 16:00:00 via INTRAVENOUS

## 2011-05-28 MED ORDER — ONDANSETRON HCL 4 MG/2ML IJ SOLN
INTRAMUSCULAR | Status: DC | PRN
  Administered 2011-05-28: 4 mg via INTRAVENOUS

## 2011-05-28 MED ORDER — ONDANSETRON HCL 4 MG PO TABS: 4.0000 mg | ORAL_TABLET | Freq: Four times a day (QID) | ORAL | Status: DC | PRN

## 2011-05-28 MED ORDER — MIDAZOLAM HCL 2 MG/2ML IJ SOLN
2.0000 mg | Freq: Once | INTRAMUSCULAR | Status: AC
  Administered 2011-05-28: 1 mg via INTRAVENOUS

## 2011-05-28 MED ORDER — CISATRACURIUM BESYLATE 2 MG/ML IV SOLN
INTRAVENOUS | Status: DC | PRN
  Administered 2011-05-28: 14 mg via INTRAVENOUS

## 2011-05-28 MED ORDER — EPHEDRINE SULFATE 50 MG/ML IJ SOLN
INTRAMUSCULAR | Status: DC | PRN
  Administered 2011-05-28: 10 mg via INTRAVENOUS

## 2011-05-28 MED ORDER — METOCLOPRAMIDE HCL 5 MG/ML IJ SOLN: 5.0000 mg | Freq: Three times a day (TID) | INTRAMUSCULAR | Status: DC | PRN

## 2011-05-28 MED ORDER — FENTANYL CITRATE 0.05 MG/ML IJ SOLN
INTRAMUSCULAR | Status: DC | PRN
  Administered 2011-05-28 (×2): 50 ug via INTRAVENOUS

## 2011-05-28 MED ORDER — SODIUM CHLORIDE 0.9 % IR SOLN
Status: DC | PRN
  Administered 2011-05-28: 21000 mL

## 2011-05-28 MED ORDER — CHLORHEXIDINE GLUCONATE 4 % EX LIQD: 60.0000 mL | Freq: Once | CUTANEOUS | Status: DC

## 2011-05-28 MED ORDER — DEXAMETHASONE SODIUM PHOSPHATE 4 MG/ML IJ SOLN
INTRAMUSCULAR | Status: DC | PRN
  Administered 2011-05-28: 10 mg via INTRAVENOUS

## 2011-05-28 MED ORDER — DEXTROSE-NACL 5-0.9 % IV SOLN
INTRAVENOUS | Status: DC
  Administered 2011-05-28: 19:00:00 via INTRAVENOUS

## 2011-05-28 MED ORDER — METOCLOPRAMIDE HCL 5 MG PO TABS: 5.0000 mg | ORAL_TABLET | Freq: Three times a day (TID) | ORAL | Status: DC | PRN

## 2011-05-28 MED ORDER — HYDROMORPHONE HCL PF 1 MG/ML IJ SOLN: 0.2500 mg | INTRAMUSCULAR | Status: DC | PRN

## 2011-05-28 MED ORDER — CEFAZOLIN SODIUM 1-5 GM-% IV SOLN
1.0000 g | Freq: Once | INTRAVENOUS | Status: AC
  Administered 2011-05-28: 1 g via INTRAVENOUS

## 2011-05-28 SURGICAL SUPPLY — 83 items
ANCHOR SUT BIO SW 4.75X19.1 (Anchor) ×2 IMPLANT
BANDAGE ADHESIVE 1X3 (GAUZE/BANDAGES/DRESSINGS) IMPLANT
BENZOIN TINCTURE PRP APPL 2/3 (GAUZE/BANDAGES/DRESSINGS) ×2 IMPLANT
BLADE AVERAGE 25X9 (BLADE) IMPLANT
BLADE CUTTER MENIS 5.5 (BLADE) IMPLANT
BLADE SURG 15 STRL LF DISP TIS (BLADE) ×2 IMPLANT
BLADE SURG 15 STRL SS (BLADE) ×2
BLADE VORTEX 6.0 (BLADE) ×2 IMPLANT
BUR EGG/OVAL CARBIDE (BURR) IMPLANT
CANISTER OMNI JUG 16 LITER (MISCELLANEOUS) ×6 IMPLANT
CANISTER SUCTION 2500CC (MISCELLANEOUS) IMPLANT
CANNULA 5.75X7 CRYSTAL CLEAR (CANNULA) IMPLANT
CANNULA SHOULDER 7CM (CANNULA) IMPLANT
CANNULA TWIST IN 8.25X7CM (CANNULA) IMPLANT
CLEANER CAUTERY TIP 5X5 PAD (MISCELLANEOUS) IMPLANT
CLOTH BEACON ORANGE TIMEOUT ST (SAFETY) ×2 IMPLANT
CUTTER MENISCUS  4.2MM (BLADE) ×1
CUTTER MENISCUS 4.2MM (BLADE) ×1 IMPLANT
DECANTER SPIKE VIAL GLASS SM (MISCELLANEOUS) IMPLANT
DRAPE INCISE IOBAN 66X45 STRL (DRAPES) ×2 IMPLANT
DRAPE STERI 35X30 U-POUCH (DRAPES) ×2 IMPLANT
DRAPE SURG 17X23 STRL (DRAPES) ×2 IMPLANT
DRAPE U-SHAPE 47X51 STRL (DRAPES) ×2 IMPLANT
DRAPE U-SHAPE 76X120 STRL (DRAPES) ×4 IMPLANT
DRSG PAD ABDOMINAL 8X10 ST (GAUZE/BANDAGES/DRESSINGS) ×2 IMPLANT
DURAPREP 26ML APPLICATOR (WOUND CARE) ×2 IMPLANT
ELECT REM PT RETURN 9FT ADLT (ELECTROSURGICAL)
ELECTRODE REM PT RTRN 9FT ADLT (ELECTROSURGICAL) IMPLANT
GLOVE BIOGEL M STRL SZ7.5 (GLOVE) ×2 IMPLANT
GLOVE BIOGEL PI IND STRL 8 (GLOVE) ×2 IMPLANT
GLOVE BIOGEL PI INDICATOR 8 (GLOVE) ×2
GLOVE ECLIPSE 6.5 STRL STRAW (GLOVE) ×2 IMPLANT
GLOVE ORTHO TXT STRL SZ7.5 (GLOVE) ×2 IMPLANT
GOWN PREVENTION PLUS XLARGE (GOWN DISPOSABLE) ×4 IMPLANT
GOWN PREVENTION PLUS XXLARGE (GOWN DISPOSABLE) ×4 IMPLANT
NDL SUT 6 .5 CRC .975X.05 MAYO (NEEDLE) IMPLANT
NEEDLE MAYO TAPER (NEEDLE)
NEEDLE MINI RC 24MM (NEEDLE) IMPLANT
NEEDLE SCORPION (NEEDLE) ×2 IMPLANT
PACK ARTHROSCOPY DSU (CUSTOM PROCEDURE TRAY) ×2 IMPLANT
PACK BASIN DAY SURGERY FS (CUSTOM PROCEDURE TRAY) ×2 IMPLANT
PAD CLEANER CAUTERY TIP 5X5 (MISCELLANEOUS)
PASSER SUT SWANSON 36MM LOOP (INSTRUMENTS) IMPLANT
PENCIL BUTTON HOLSTER BLD 10FT (ELECTRODE) IMPLANT
RESECTOR FULL RADIUS 4.2MM (BLADE) IMPLANT
RESECTOR FULL RADIUS 4.8MM (BLADE) IMPLANT
SLEEVE SCD COMPRESS KNEE MED (MISCELLANEOUS) ×2 IMPLANT
SLING ARM FOAM STRAP LRG (SOFTGOODS) ×2 IMPLANT
SLING ARM FOAM STRAP MED (SOFTGOODS) IMPLANT
SPONGE GAUZE 4X4 12PLY (GAUZE/BANDAGES/DRESSINGS) ×2 IMPLANT
SPONGE LAP 4X18 X RAY DECT (DISPOSABLE) IMPLANT
STRIP CLOSURE SKIN 1/2X4 (GAUZE/BANDAGES/DRESSINGS) ×2 IMPLANT
SUCTION FRAZIER TIP 10 FR DISP (SUCTIONS) IMPLANT
SUT ETHIBOND 2 OS 4 DA (SUTURE) IMPLANT
SUT ETHILON 4 0 PS 2 18 (SUTURE) IMPLANT
SUT FIBERWIRE #2 38 T-5 BLUE (SUTURE)
SUT FIBERWIRE 3-0 18 TAPR NDL (SUTURE)
SUT LASSO 45 DEGREE (SUTURE) ×2 IMPLANT
SUT PROLENE 1 CT (SUTURE) IMPLANT
SUT PROLENE 3 0 PS 2 (SUTURE) ×2 IMPLANT
SUT TIGER TAPE 7 IN WHITE (SUTURE) IMPLANT
SUT VIC AB 0 CT1 27 (SUTURE)
SUT VIC AB 0 CT1 27XBRD ANBCTR (SUTURE) IMPLANT
SUT VIC AB 0 SH 27 (SUTURE) IMPLANT
SUT VIC AB 2-0 SH 27 (SUTURE)
SUT VIC AB 2-0 SH 27XBRD (SUTURE) IMPLANT
SUT VIC AB 3-0 SH 27 (SUTURE) ×1
SUT VIC AB 3-0 SH 27X BRD (SUTURE) ×1 IMPLANT
SUT VIC AB 3-0 X1 27 (SUTURE) IMPLANT
SUTURE FIBERWR #2 38 T-5 BLUE (SUTURE) IMPLANT
SUTURE FIBERWR 3-0 18 TAPR NDL (SUTURE) IMPLANT
SYR 3ML 23GX1 SAFETY (SYRINGE) IMPLANT
SYR BULB 3OZ (MISCELLANEOUS) IMPLANT
TAPE FIBER 2MM 7IN #2 BLUE (SUTURE) IMPLANT
TAPE PAPER 3X10 WHT MICROPORE (GAUZE/BANDAGES/DRESSINGS) ×2 IMPLANT
TI CRON ×2 IMPLANT
TOWEL OR 17X24 6PK STRL BLUE (TOWEL DISPOSABLE) ×2 IMPLANT
TUBE CONNECTING 20X1/4 (TUBING) ×2 IMPLANT
TUBING ARTHROSCOPY IRRIG 16FT (MISCELLANEOUS) IMPLANT
TWIST CANNULA ×2 IMPLANT
WAND STAR VAC 90 (SURGICAL WAND) ×2 IMPLANT
WATER STERILE IRR 1000ML POUR (IV SOLUTION) ×2 IMPLANT
YANKAUER SUCT BULB TIP NO VENT (SUCTIONS) IMPLANT

## 2011-05-28 NOTE — Transfer of Care (Signed)
Immediate Anesthesia Transfer of Care Note  Patient: Lucas Shepherd  Procedure(s) Performed:  SHOULDER ARTHROSCOPY WITH ROTATOR CUFF REPAIR - arthroscopy left shoulder, sad, dcr, rotator cuff repair andbiceps tenotomy  Patient Location: PACU  Anesthesia Type: GA combined with regional for post-op pain  Level of Consciousness: awake, alert , oriented and patient cooperative  Airway & Oxygen Therapy: Patient Spontanous Breathing and Patient connected to face mask oxygen  Post-op Assessment: Report given to PACU RN and Post -op Vital signs reviewed and stable  Post vital signs: Reviewed and stable  Complications: No apparent anesthesia complications

## 2011-05-28 NOTE — Op Note (Signed)
NAME:  CESARIO, WEIDINGER NO.:  MEDICAL RECORD NO.:  [id number]  LOCATION:                                 FACILITY:  PHYSICIAN:  Katy Fitch. Delance Weide, M.D.      DATE OF BIRTH:  DATE OF PROCEDURE:  05/28/2011 DATE OF DISCHARGE:                              OPERATIVE REPORT   PREOPERATIVE DIAGNOSIS:  Left shoulder discomfort with MRI evidence of advanced AC degenerative arthritis, unfavorable type 3 acromion, and unstable SLAP lesion with tendinosis rotator cuff, rule out possible subscapularis rotator cuff tear.  POSTOPERATIVE DIAGNOSIS:  Minimal pasta tear of supraspinatus measuring less than 20% of the footprint with unstable type 2 SLAP lesion and minimal chondromalacia of glenoid and type 3 subacromial anatomy with a chronic AC impingement.  OPERATION: 1. Diagnostic arthroscopy of left glenohumeral joint. 2. Arthroscopic debridement of SLAP lesion, and ultimate biceps     tenotomy after unsuccessful effort at arthroscopic biceps     tenodesis. 3. Subacromial decompression with bursectomy, coracoacromial ligament     release, and acromioplasty. 4. Arthroscopic distal clavicle resection.  SURGEON:  Katy Fitch. Shakia Sebastiano, M.D.  ASSIST:  Marveen Reeks Dasnoit, P.A.  ANESTHESIA:  General endotracheal supplemented by a left plexus block.  SUPERVISING ANESTHESIOLOGIST:  Dr. Kipp Brood.  INDICATIONS:  Lucas Shepherd is a 77 year old gentleman who is very active, enjoying playing golf on a regular basis who presented for evaluation and management of shoulder pain.  Clinical examination revealed signs of impingement either stage II or III and possible rotator cuff tear.  After he failed nonoperative measures and had persistent pain with golf and at night he was sent for an MRI, which documented tendinopathy of the rotator cuff, thinning of the supraspinatus, a pasta type lesion that was only partial thickness perhaps 25%, and a SLAP lesion.  Due to his failure  to respond to nonoperative measures and rehab, we elected to proceed with diagnostic arthroscopy and we discussed preoperatively possible repair of the rotator cuff, possible biceps tenodesis versus tenotomy, and subacromial decompression anticipating acromioplasty and distal clavicle resection.  Preoperatively, he was reminded the potential risks and benefits of surgery.  Our goal is to relieve his pain and facilitate his return to playing golf as soon as possible.  Potential complications include failure to relieve all as pain, failure to prevent the development of rotator cuff tear at age 77 which will soon occur in December 2012, also he understands that there can be anesthetic complications, potential neurovascular injury, and at times sodium overload with arthroscopic fluid.  Questions were invited and answered in detail.  PROCEDURE:  Abdullah Rizzi was interviewed by Dr. Noreene Larsson in the holding area and after informed consent, had a left lidocaine plexus block placed without complication, this led the satisfactory anesthesia of left upper extremity and forequarter.  He was then transferred to room 2 of the [facility] where under Dr. Morley Kos direct supervision, general endotracheal anesthesia was induced.  He was carefully positioned in beach chair position with aid of a torso and head holder designed for shoulder arthroscopy.  Examination of shoulder under anesthesia revealed no sign of significant instability nor adhesive capsulitis.  The left  upper extremity forequarter prepped with DuraPrep and draped with impervious arthroscopy drapes.  The procedure commenced with routine surgical time-out followed by instrumentation of the shoulder from posterior approach through a standard posterior viewing portal with a switching stick placed anteriorly.  Diagnostic arthroscopy revealed moderate chondromalacia of the central glenoid.  The humeral head had minimal grade 1  chondromalacia.  There was a 20% pasta lesion of the anterior supraspinatus and subscapularis appeared to have a satisfactory foot print.  There was an unstable type 2 SLAP lesion that had a positive peel back sign and complete unroofing of the superior glenoid. The anterior labrum was quite unstable, but the anterior glenohumeral ligaments were stable.  The unstable fragments of the labrum were removed with a 4.2 mm suction shaver brought anteriorly followed by bursectomy and synovectomy allowing visualization of the pasta lesion. We initially performed a tenotomy of the biceps and secured the biceps tendon with a spinal needle to prevent retraction.  We contemplated arthroscopic tenodesis of the biceps utilizing an inside the joint technique with swivel lock.  We then proceeded to the subacromial space where we performed bursectomy.  The spinal needle was removed and the biceps noted to be stable.  The subacromial bursa was debrided followed by leveling the acromion to a type 1 morphology.  There was a very large anterior acromial spur measuring more than a cm in length.  The distal clavicle was quite prominent was leveled to a smooth surface colinear with the acromion.  The bursa was debrided thoroughly allowing full visualization of the bursal surface of the cuff.  There were no full-thickness rotator cuff tears noted.  After completion of the subacromial decompression and debridement, hemostasis was achieved with bipolar cautery.  We then returned to the glenohumeral joint where we began arthroscopic tenodesis of long head of the biceps.  A swivel lock was placed with trans tendon technique and was going to be used with a mattress suture with the biceps being tenodesed at the bicipital groove at the pasta repair.  A suture Lasso was used to pass a PDS suture through the biceps and as we were shuttling the FiberWire suture from the anchor, we inadvertently snagged both suture  limbs unloading the anchor.  Given this circumstance, and given the duration surgery as well as Mr. Stiff age of 77, I elected to proceed with simple biceps tenotomy in an effort to return him to his active life playing golf as soon as possible.  The suction shaver was then utilized to debride the remaining portion of the biceps tendon intra-articularly.  Hemostasis was achieved and the arthroscopic equipment was removed.  The portals repaired with intradermal 3-0 Prolene suture.  The wounds were then dressed with sterile gauze, and paper tape.  Mr. Baggerly was placed in a splint.  Awakened from general anesthesia and transferred to PACU with stable vital signs.  He will be admitted to [facility] for observation of the vital signs, analgesics in form p.o. and IV Dilaudid and prophylactic antibiotics in form of Ancef 1 g IV q.6 h. x3 doses.  He will be discharged early on the morning of May 29, 2011.     Katy Fitch Griffith Santilli, M.D.     RVS/MEDQ  D:  05/28/2011  T:  05/28/2011  Job:  [id number]

## 2011-05-28 NOTE — Brief Op Note (Signed)
05/28/2011  5:39 PM  PATIENT:  Para Skeans  77 y.o. male  PRE-OPERATIVE DIAGNOSIS:  Impingement syndrome of left shoulder [726.2] type 3 acromion, type 2 SLAP  POST-OPERATIVE DIAGNOSIS:  Impingement syndrome of left shoulder [726.2] type 3 acromion, type 2 SLAP                                                         Minimal PASTA tear  PROCEDURE:  Procedure(s): SHOULDER ARTHROSCOPY WITH DEBRIDEMENT PASTA TEAR SUPRASPINATUS, BICEPS TENOTOMY SUB ACROMIAL DECOMPRESSION, ACROMIOPLASTY, DISTAL CLAVICLE EXCISION  SURGEON:  Surgeon(s): Wyn Forster., MD  PHYSICIAN ASSISTANT:   ASSISTANTS: Indea Dearman Dasnoit, P.A-C  ANESTHESIA:   general  EBL:  Total I/O In: 1000 [I.V.:1000] Out: -   BLOOD ADMINISTERED:none  DRAINS: none   LOCAL MEDICATIONS USED:  NONE  SPECIMEN:  No Specimen  DISPOSITION OF SPECIMEN:  N/A  COUNTS:  YES  TOURNIQUET:  * No tourniquets in log *  DICTATION: .Other Dictation: Dictation Number [id number]  PLAN OF CARE: Admit for overnight observation  PATIENT DISPOSITION:  PACU - hemodynamically stable.

## 2011-05-28 NOTE — Anesthesia Preprocedure Evaluation (Addendum)
Anesthesia Evaluation  Patient identified by MRN, date of birth, ID band Patient awake    Reviewed: Allergy & Precautions, H&P , NPO status   Airway Mallampati: I  Neck ROM: Full    Dental  (+) Teeth Intact and Dental Advisory Given   Pulmonary neg pulmonary ROS,  clear to auscultation        Cardiovascular Exercise Tolerance: Good neg cardio ROS Regular Normal    Neuro/Psych    GI/Hepatic   Endo/Other    Renal/GU      Musculoskeletal   Abdominal   Peds  Hematology   Anesthesia Other Findings   Reproductive/Obstetrics                          Anesthesia Physical Anesthesia Plan  ASA: II  Anesthesia Plan: General   Post-op Pain Management:    Induction: Intravenous  Airway Management Planned: Oral ETT  Additional Equipment:   Intra-op Plan:   Post-operative Plan: Extubation in OR  Informed Consent: I have reviewed the patients History and Physical, chart, labs and discussed the procedure including the risks, benefits and alternatives for the proposed anesthesia with the patient or authorized representative who has indicated his/her understanding and acceptance.   Dental advisory given  Plan Discussed with: CRNA and Surgeon  Anesthesia Plan Comments:         Anesthesia Quick Evaluation

## 2011-05-28 NOTE — Anesthesia Procedure Notes (Addendum)
Anesthesia Regional Block:  Supraclavicular block  Pre-Anesthetic Checklist: ,, timeout performed,, Correct Site,, Correct Procedure,, site marked,,, surgical consent,, at surgeon's request  Laterality: Left  Prep: chloraprep       Needles:  Injection technique: Single-shot      Additional Needles: Supraclavicular block Narrative:    Anesthesia Regional Block:  Supraclavicular block  Pre-Anesthetic Checklist: ,, timeout performed,, Correct Site,, Correct Procedure,, site marked,,, surgical consent,, at surgeon's request  Laterality: Left  Prep: chloraprep       Needles:  Injection technique: Single-shot  Needle Type: Echogenic Needle      Needle Gauge: 22 and 22 G    Additional Needles:  Procedures: ultrasound guided Supraclavicular block  Nerve Stimulator or Paresthesia:  Response: forearm twitch, 1 mA,   Additional Responses:   Narrative:  Start time: 05/28/2011 1:55 PM End time: 05/28/2011 2:05 PM  Performed by: Personally   Additional Notes: 20 cc 0.5% Ropivicaine injected under US guidance.   Procedure Name: Intubation Date/Time: 05/28/2011 3:51 PM Performed by: Jearld Shines Pre-anesthesia Checklist: Patient identified, Timeout performed, Emergency Drugs available, Suction available and Patient being monitored Patient Re-evaluated:Patient Re-evaluated prior to inductionOxygen Delivery Method: Circle System Utilized Preoxygenation: Pre-oxygenation with 100% oxygen Intubation Type: IV induction Ventilation: Mask ventilation without difficulty Laryngoscope Size: Miller and 3 Grade View: Grade I Tube type: Oral Tube size: 8.0 mm Number of attempts: 1 Placement Confirmation: ETT inserted through vocal cords under direct vision,  positive ETCO2 and breath sounds checked- equal and bilateral Secured at: 22 cm Tube secured with: Tape Dental Injury: Teeth and Oropharynx as per pre-operative assessment

## 2011-05-28 NOTE — Anesthesia Postprocedure Evaluation (Signed)
  Anesthesia Post-op Note  Patient: Lucas Shepherd  Procedure(s) Performed:  SHOULDER ARTHROSCOPY WITH ROTATOR CUFF REPAIR - arthroscopy left shoulder, sad, dcr, rotator cuff repair andbiceps tenotomy  Patient Location: PACU  Anesthesia Type: General  Level of Consciousness: awake  Airway and Oxygen Therapy: Patient Spontanous Breathing  Post-op Pain: none  Post-op Assessment: Post-op Vital signs reviewed  Post-op Vital Signs: stable  Complications: No apparent anesthesia complications

## 2011-05-28 NOTE — Op Note (Signed)
[id number] op note dictated MRN:  [id number]

## 2011-05-29 MED ORDER — ZOLPIDEM TARTRATE 5 MG PO TABS
5.0000 mg | ORAL_TABLET | Freq: Every evening | ORAL | Status: DC | PRN
  Administered 2011-05-29: 5 mg via ORAL

## 2011-06-11 ENCOUNTER — Encounter (HOSPITAL_COMMUNITY): Payer: Self-pay | Admitting: Emergency Medicine

## 2011-06-11 ENCOUNTER — Emergency Department (HOSPITAL_COMMUNITY): Payer: Medicare Other

## 2011-06-11 ENCOUNTER — Inpatient Hospital Stay (HOSPITAL_COMMUNITY)
Admission: EM | Admit: 2011-06-11 | Discharge: 2011-06-15 | DRG: 811 | Disposition: A | Discharge status: Home / Self Care | Payer: Medicare Other | Attending: Internal Medicine | Admitting: Internal Medicine

## 2011-06-11 DIAGNOSIS — K921 Melena: Secondary | ICD-10-CM | POA: Diagnosis present

## 2011-06-11 DIAGNOSIS — R7989 Other specified abnormal findings of blood chemistry: Secondary | ICD-10-CM | POA: Diagnosis present

## 2011-06-11 DIAGNOSIS — K219 Gastro-esophageal reflux disease without esophagitis: Secondary | ICD-10-CM

## 2011-06-11 DIAGNOSIS — R55 Syncope and collapse: Secondary | ICD-10-CM | POA: Diagnosis present

## 2011-06-11 DIAGNOSIS — K922 Gastrointestinal hemorrhage, unspecified: Secondary | ICD-10-CM

## 2011-06-11 DIAGNOSIS — E869 Volume depletion, unspecified: Secondary | ICD-10-CM | POA: Diagnosis present

## 2011-06-11 DIAGNOSIS — D72829 Elevated white blood cell count, unspecified: Secondary | ICD-10-CM | POA: Diagnosis present

## 2011-06-11 DIAGNOSIS — R5381 Other malaise: Secondary | ICD-10-CM | POA: Diagnosis present

## 2011-06-11 DIAGNOSIS — D649 Anemia, unspecified: Secondary | ICD-10-CM | POA: Diagnosis present

## 2011-06-11 DIAGNOSIS — K3182 Dieulafoy lesion (hemorrhagic) of stomach and duodenum: Secondary | ICD-10-CM | POA: Diagnosis present

## 2011-06-11 DIAGNOSIS — R031 Nonspecific low blood-pressure reading: Secondary | ICD-10-CM | POA: Diagnosis present

## 2011-06-11 DIAGNOSIS — D62 Acute posthemorrhagic anemia: Principal | ICD-10-CM | POA: Diagnosis present

## 2011-06-11 DIAGNOSIS — Z9889 Other specified postprocedural states: Secondary | ICD-10-CM

## 2011-06-11 HISTORY — DX: Diaphragmatic hernia without obstruction or gangrene: K44.9

## 2011-06-11 HISTORY — DX: Calculus of kidney: N20.0

## 2011-06-11 HISTORY — DX: Personal history of other specified conditions: Z87.898

## 2011-06-11 HISTORY — DX: Personal history of other diseases of the musculoskeletal system and connective tissue: Z87.39

## 2011-06-11 HISTORY — DX: Urgency of urination: R39.15

## 2011-06-11 LAB — CBC
HCT: 25 % — ABNORMAL LOW (ref 39.0–52.0)
HCT: 23 % — ABNORMAL LOW (ref 39.0–52.0)
Hemoglobin: 8.5 g/dL — ABNORMAL LOW (ref 13.0–17.0)
MCH: 32.4 pg (ref 26.0–34.0)
MCH: 32.2 pg (ref 26.0–34.0)
MCHC: 34 g/dL (ref 30.0–36.0)
MCV: 93.9 fL (ref 78.0–100.0)
Platelets: 171 10*3/uL (ref 150–400)
RBC: 2.62 MIL/uL — ABNORMAL LOW (ref 4.22–5.81)
RDW: 13.3 % (ref 11.5–15.5)

## 2011-06-11 LAB — BASIC METABOLIC PANEL
BUN: 47 mg/dL — ABNORMAL HIGH (ref 6–23)
Chloride: 109 mEq/L (ref 96–112)
Creatinine, Ser: 0.62 mg/dL (ref 0.50–1.35)
Glucose, Bld: 105 mg/dL — ABNORMAL HIGH (ref 70–99)
Potassium: 4 mEq/L (ref 3.5–5.1)

## 2011-06-11 LAB — DIFFERENTIAL
Basophils Absolute: 0 10*3/uL (ref 0.0–0.1)
Basophils Relative: 0 % (ref 0–1)
Eosinophils Absolute: 0.1 10*3/uL (ref 0.0–0.7)
Eosinophils Relative: 1 % (ref 0–5)
Lymphs Abs: 1.4 10*3/uL (ref 0.7–4.0)
Lymphs Abs: 2.5 10*3/uL (ref 0.7–4.0)
Monocytes Absolute: 0.7 10*3/uL (ref 0.1–1.0)
Monocytes Absolute: 1.1 10*3/uL — ABNORMAL HIGH (ref 0.1–1.0)
Monocytes Relative: 5 % (ref 3–12)
Neutro Abs: 12 10*3/uL — ABNORMAL HIGH (ref 1.7–7.7)
Neutrophils Relative %: 85 % — ABNORMAL HIGH (ref 43–77)

## 2011-06-11 LAB — CARDIAC PANEL(CRET KIN+CKTOT+MB+TROPI)
CK, MB: 3 ng/mL (ref 0.3–4.0)
Relative Index: INVALID (ref 0.0–2.5)
Total CK: 68 U/L (ref 7–232)
Troponin I: <0.3 ng/mL (ref <0.30)

## 2011-06-11 LAB — URINALYSIS, ROUTINE W REFLEX MICROSCOPIC
Bilirubin Urine: NEGATIVE
Glucose, UA: NEGATIVE mg/dL
Hgb urine dipstick: NEGATIVE
Nitrite: NEGATIVE
Specific Gravity, Urine: 1.023 (ref 1.005–1.030)
pH: 5 (ref 5.0–8.0)

## 2011-06-11 IMAGING — CR DG CHEST 1V PORT
1 series · 1 of 1 positions shown · non-contrast
Comparison: None

CLINICAL DATA: Syncope, shortness of breath.

PORTABLE CHEST - 1 VIEW

[view not recorded]
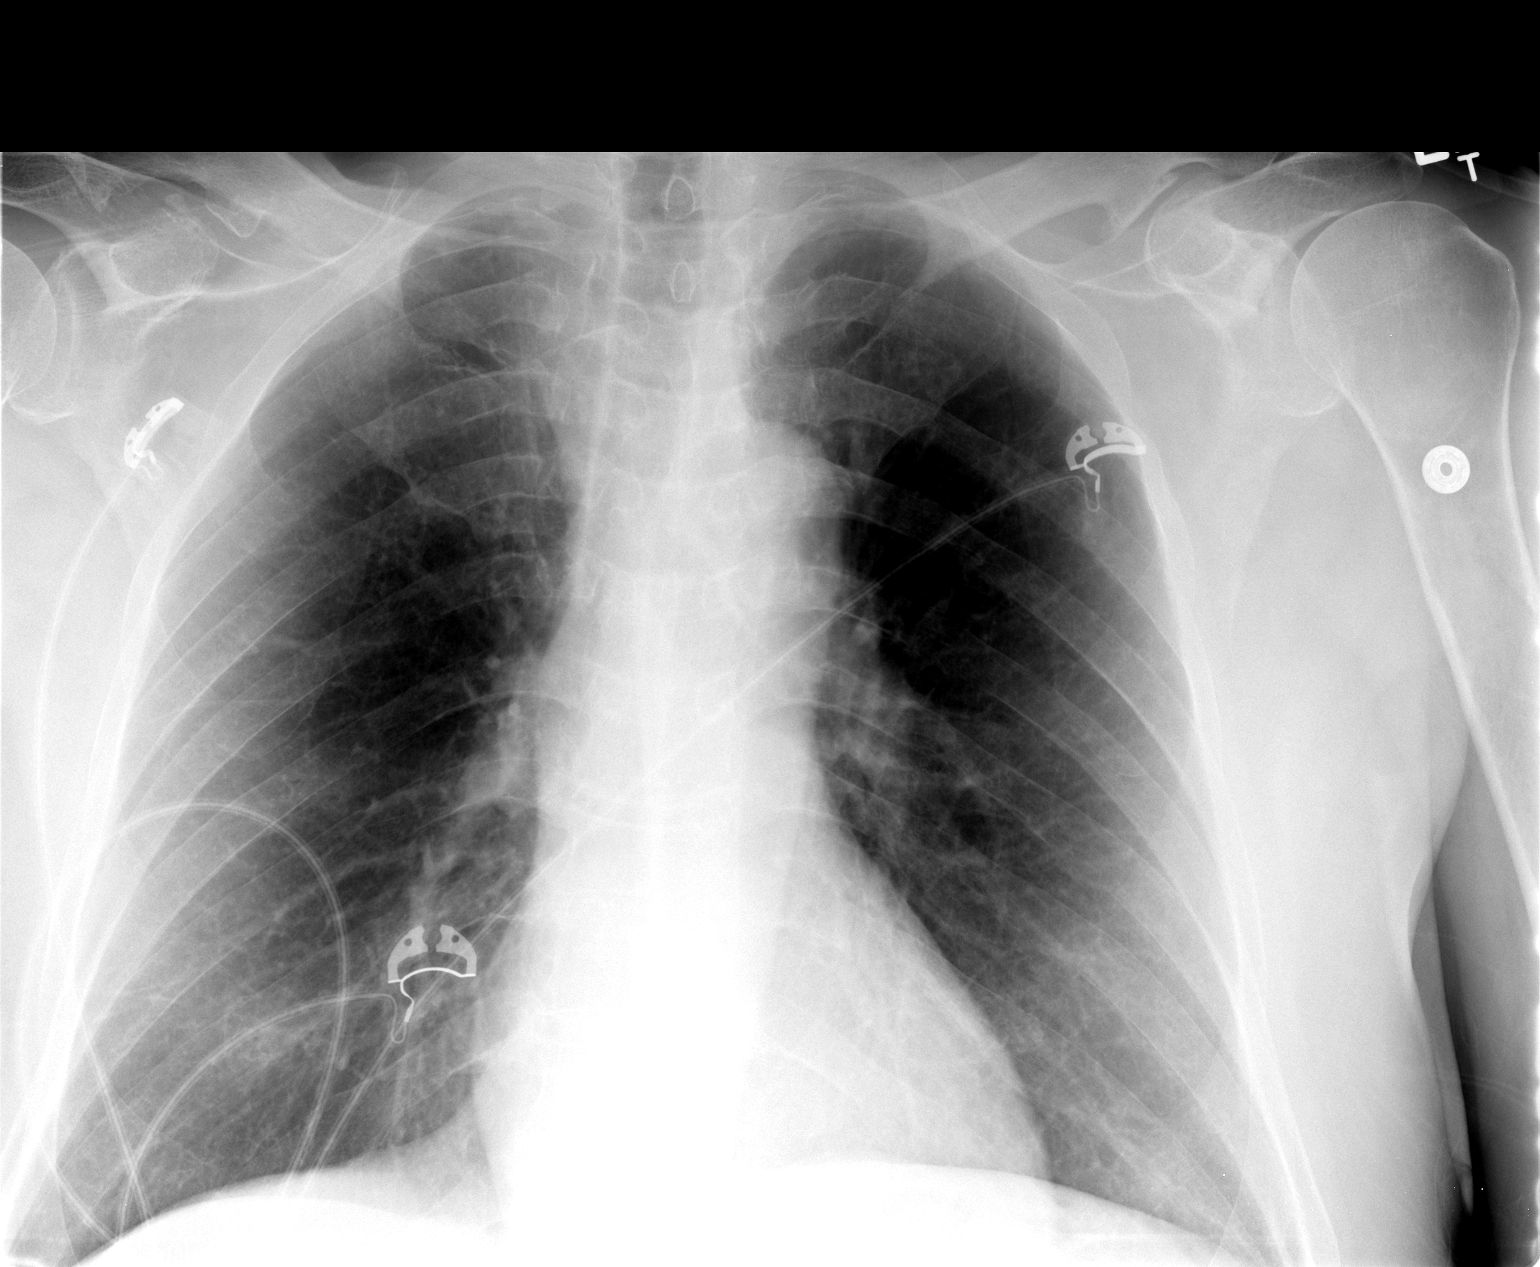

[1 of 1 positions shown; findings below may reference images not displayed]

FINDINGS: Heart and mediastinal contours are within normal limits.
No focal opacities or effusions.  No acute bony abnormality.
IMPRESSION: No active cardiopulmonary disease.

## 2011-06-11 MED ORDER — SODIUM CHLORIDE 0.9 % IV BOLUS (SEPSIS)
500.0000 mL | Freq: Once | INTRAVENOUS | Status: AC
  Administered 2011-06-11: 500 mL via INTRAVENOUS

## 2011-06-11 MED ORDER — PANTOPRAZOLE SODIUM 40 MG IV SOLR
40.0000 mg | Freq: Once | INTRAVENOUS | Status: AC
  Administered 2011-06-11: 40 mg via INTRAVENOUS
  Filled 2011-06-11: qty 40

## 2011-06-11 MED ORDER — FUROSEMIDE 10 MG/ML IJ SOLN
10.0000 mg | Freq: Once | INTRAMUSCULAR | Status: AC
  Administered 2011-06-12: 10 mg via INTRAVENOUS
  Filled 2011-06-11: qty 2

## 2011-06-11 MED ORDER — SODIUM CHLORIDE 0.9 % IV SOLN
8.0000 mg/h | INTRAVENOUS | Status: DC
  Administered 2011-06-12 – 2011-06-14 (×6): 8 mg/h via INTRAVENOUS
  Filled 2011-06-11 (×15): qty 80

## 2011-06-11 MED ORDER — FUROSEMIDE 10 MG/ML IJ SOLN: 10.0000 mg | INTRAMUSCULAR | Status: DC

## 2011-06-11 MED ORDER — DIPHENHYDRAMINE HCL 50 MG/ML IJ SOLN
25.0000 mg | Freq: Four times a day (QID) | INTRAMUSCULAR | Status: DC | PRN
  Administered 2011-06-11: 25 mg via INTRAVENOUS
  Filled 2011-06-11: qty 1

## 2011-06-11 MED ORDER — SODIUM CHLORIDE 0.9 % IV SOLN
INTRAVENOUS | Status: DC
  Administered 2011-06-12 – 2011-06-14 (×5): via INTRAVENOUS

## 2011-06-11 MED ORDER — ACETAMINOPHEN 325 MG PO TABS
650.0000 mg | ORAL_TABLET | Freq: Once | ORAL | Status: AC
  Administered 2011-06-11: 650 mg via ORAL
  Filled 2011-06-11: qty 2

## 2011-06-11 MED ORDER — SODIUM CHLORIDE 0.9 % IV SOLN
INTRAVENOUS | Status: DC
  Administered 2011-06-11: 18:00:00 via INTRAVENOUS

## 2011-06-11 NOTE — H&P (Signed)
PCP:   Darnelle Bos, MD, MD   Chief Complaint:  Syncope  HPI: The patient is a pleasant 77 year old white male with past medical history significant for GERD, anemia- that his last hemoglobin 13.5 2weeks ago, status post left shoulder arthroscopy 2 weeks ago with intermittent NSAID use also of chronic aspirin use who presents with above complaints. He states that last night he had a dizziness/light headedness and one syncopal episode after using the bathroom at about 1145PM. But today he went to see his urologist because him about 6 months ago up he had another syncopal episode and at that time he was admitted to the hospital and an workup was otherwise negative except that he was found to have a urinary tract infection. While at the urologist office he was asked to give a urine sample and when he went to the bathroom he had another syncopal episode. He was l brought to the  ED following the syncopal episode for further evaluation and management. He admits to intermittent upper abdominal discomfort which he has attributed to GERD and HIS hiatal hernia. He also reports that she's been having intermittent left sided chest pain since  his  left shoulder surgery which he has attributed to muscle pain. He denies any associated nausea or vomiting , shortness of breath and no radiation. Patient states that he had an episode of melena times one today prior to going to of the urologist office.  He was seen in the ED and labwork are revealed a small pin of 8.5 which was down from his hemoglobin is 13.5 2 weeks ago.  Hemoccult was positive and he is admitted for further evaluation and management. he admits to urinary frequency, denies fevers  also denies cough. Review of Systems:  The patient denies anorexia, fever, weight loss,, vision loss, decreased hearing, hoarseness dyspnea on exertion, peripheral edema, balance deficits, hemoptysis melena, hematochezia,  hematuria, incontinence, muscle weakness,  transient blindness, difficulty walking, depression, unusual weight change.  Past Medical History: Past Medical History  Diagnosis Date  . GERD (gastroesophageal reflux disease)     nisseum fundoplication 2010  . Anemia   . BPH (benign prostatic hyperplasia)    Past Surgical History  Procedure Date  . Nissen fundoplication 2010  . Shoulder arthroscopy 1954    rt  . Abdominal surgery 1985    tummy tuc  . Colonoscopy     Medications: Prior to Admission medications   Medication Sig Start Date End Date Taking? Authorizing Provider  aspirin 325 MG EC tablet Take 325 mg by mouth every other day.    Yes Historical Provider, MD  fish oil-omega-3 fatty acids 1000 MG capsule Take 2 g by mouth daily.     Yes Historical Provider, MD  glucosamine-chondroitin 500-400 MG tablet Take 1 tablet by mouth 2 (two) times daily between meals.    Yes Historical Provider, MD  Grape Seed Extract 100 MG CAPS Take 1 capsule by mouth daily.     Yes Historical Provider, MD  mesalamine (PENTASA) 500 MG CR capsule Take 1,000 mg by mouth 3 (three) times daily.     Yes Historical Provider, MD  multivitamin-lutein (OCUVITE-LUTEIN) CAPS Take 1 capsule by mouth daily.    Yes Historical Provider, MD  OVER THE COUNTER MEDICATION Take 1 capsule by mouth daily. Beta Sitosterol capsule    Yes Historical Provider, MD  OVER THE COUNTER MEDICATION Take 1 capsule by mouth daily. BLADDERWRACK.    Yes Historical Provider, MD  Tamsulosin HCl (  FLOMAX) 0.4 MG CAPS Take 0.4 mg by mouth Nightly.    Yes Historical Provider, MD    Allergies:  No Known Allergies  Social History:  reports that he quit smoking about 45 years ago. He does not have any smokeless tobacco history on file. He reports that he drinks about .6 ounces of alcohol per week. He reports that he does not use illicit drugs.  Family History: History reviewed. No pertinent family history.  Physical Exam: Filed Vitals:   06/11/11 1113 06/11/11 1157 06/11/11 1158  06/11/11 1708  BP: 140/68 122/63 123/79   Pulse: 85 78 96   Temp: 98.1 F (36.7 C) 97.8 F (36.6 C)  98.8 F (37.1 C)  TempSrc: Oral Oral  Oral  Resp: 18 21 19    Height: 6' (1.829 m)     Weight: 92.987 kg (205 lb)     SpO2: 100% 100% 100%    BP 123/79  Pulse 96  Temp(Src) 98.8 F (37.1 C) (Oral)  Resp 19  Ht 6' (1.829 m)  Wt 92.987 kg (205 lb)  BMI 27.80 kg/m2  SpO2 100%  General Appearance:    Alert, cooperative, no distress, appears stated age  Head:    Normocephalic, without obvious abnormality, atraumatic  Eyes:    PERRL, conjunctiva/corneas clear, EOM's intact,       Throat:   Lips, mucosa, and tongue normal; teeth and gums normal  Neck:   Supple, symmetrical, trachea midline, no adenopathy;       thyroid:  No enlargement/tenderness/nodules; no carotid   bruit or JVD  Lungs:     Clear to auscultation bilaterally, respirations unlabored  Chest wall:    No tenderness or deformity  Heart:    Regular rate and rhythm, S1 and S2 normal, no murmur, rub   or gallop  Abdomen:     Soft, non-tender, bowel sounds active all four quadrants,    no masses, no organomegaly  Extremities:   Extremities normal, atraumatic, no cyanosis or edema  Pulses:   2+ and symmetric all extremities  Skin:   Skin color, texture, turgor normal, no rashes or lesions  Neurologic:   CNII-XII intact. Normal strength, sensation and reflexes      throughout    Labs on Admission:   Basename 06/11/11 1323  NA 139  K 4.0  CL 109  CO2 23  GLUCOSE 105*  BUN 47*  CREATININE 0.62  CALCIUM 8.3*  MG --  PHOS --   No results found for this basename: AST:2,ALT:2,ALKPHOS:2,BILITOT:2,PROT:2,ALBUMIN:2 in the last 72 hours No results found for this basename: LIPASE:2,AMYLASE:2 in the last 72 hours  Basename 06/11/11 1705 06/11/11 1323  WBC 12.1* 14.1*  NEUTROABS 8.3* 12.0*  HGB 7.9* 8.5*  HCT 23.0* 25.0*  MCV 93.9 95.4  PLT 171 171   No results found for this basename:  CKTOTAL:3,CKMB:3,CKMBINDEX:3,TROPONINI:3 in the last 72 hours No results found for this basename: TSH,T4TOTAL,FREET3,T3FREE,THYROIDAB in the last 72 hours No results found for this basename: VITAMINB12:2,FOLATE:2,FERRITIN:2,TIBC:2,IRON:2,RETICCTPCT:2 in the last 72 hours  Radiological Exams on Admission: Dg Chest 2 View  05/27/2011  *RADIOLOGY REPORT*  Clinical Data: Preoperative respiratory exam.  Shoulder spurs.  CHEST - 2 VIEW  Comparison: None.  Findings: The heart size and vascularity are normal and the lungs are clear.  No significant osseous abnormality.  No effusions.  IMPRESSION: Normal chest.  Original Report Authenticated By: Gwynn Burly, M.D.   Dg Chest Port 1 View  06/11/2011  *RADIOLOGY REPORT*  Clinical Data: Syncope,  shortness of breath.  PORTABLE CHEST - 1 VIEW  Comparison: None  Findings: Heart and mediastinal contours are within normal limits. No focal opacities or effusions.  No acute bony abnormality.  IMPRESSION: No active cardiopulmonary disease.  Original Report Authenticated By: Cyndie Chime, M.D.    Assessment/Plan Present on Admission:  .Syncope and collapse -Secondary to GI bleed versus vasovagal. Given the patient is also complaining of some chest pain we'll also obtain cardiac enzymes and a 2-D echocardiogram, follow further manage accordingly. He has no focal neurologic symptoms at this time .Chest pain -Cycle cardiac enzymes are, hold off aspirin and secondary to a to GI bleed. Will cover  with a PPI given his history of GERD and/hiatal hernia the  .Melena/GI bleed  -Will monitor and H&H, type and screen, transfuse to keep a hemoglobin of 8 or greater. I have consulted and [facility] for endoscopy, patient seen by Dr. Jarold Motto in the past. He is hemodynamically stable. I will place on a PPI. He is hemodynamically stable at this time, will monitor in the SDU.  Marland KitchenAnemia -Secondary to GI bleed, as above  .GERD -PPI as above the  .Azotemia/Volume  depletion- hydrate follow and recheck  .Leukocytosis- urinalysis negative for infection, and chest x-ray with no acute infiltrates. Possibly reactive ., follow the urine cultures and further manage accordingly.   Midas Daughety C 06/11/2011, 5:45 PM

## 2011-06-11 NOTE — ED Notes (Signed)
2235 no reaction  Noted, blood increased to 175 cc/hr per md instructions to transfuse over 2 hours, bp 111/44, t 98.6, rr 18, p 78

## 2011-06-11 NOTE — ED Notes (Signed)
2335 pt remains reactions free from blood, t 98.6, p 86, bp 111/44, rr 18 98 % 2l, 1st unit cont to infuse

## 2011-06-11 NOTE — ED Notes (Signed)
Talking with family members and denies any areas of discomfort---B/P 133/71  HR 83  R 20  Skin color more pink than upon arrival.  Pt. Denies any history for anemia.

## 2011-06-11 NOTE — ED Notes (Signed)
Dr. Leta Speller notified of 1900 hgb, pt remains pale, dizzy, but alert and oriented

## 2011-06-11 NOTE — ED Notes (Signed)
Oral temp 98.5   Hospitalist here to see pt.

## 2011-06-11 NOTE — Progress Notes (Addendum)
Lucas Shepherd CSN:[id number],MRN:[id number] is a 77 y.o. male,  Outpatient Primary MD for the patient is Darnelle Bos, MD, MD  Chief Complaint  Patient presents with  . Loss of Consciousness        Subjective:   Lucas Shepherd today has, No headache, No chest pain, No abdominal pain - No Nausea, No new weakness tingling or numbness, No Cough - SOB.  Feels weak all over.  Objective:   Filed Vitals:   06/11/11 1158 06/11/11 1708 06/11/11 2000 06/11/11 2130  BP: 123/79  115/52 115/58  Pulse: 96  85 86  Temp:  98.8 F (37.1 C) 99.1 F (37.3 C) 99.5 F (37.5 C)  TempSrc:  Oral Oral Oral  Resp: 19     Height:      Weight:      SpO2: 100%  98% 96%    Wt Readings from Last 3 Encounters:  06/11/11 92.987 kg (205 lb)  05/27/11 92.987 kg (205 lb)  05/27/11 92.987 kg (205 lb)    No intake or output data in the 24 hours ending 06/11/11 2243  Exam Awake Alert, Oriented *3, No new F.N deficits, Normal affect [place].AT,PERRAL Supple Neck,No JVD, No cervical lymphadenopathy appriciated.  Symmetrical Chest wall movement, Good air movement bilaterally, CTAB RRR,No Gallops,Rubs or new Murmurs, No Parasternal Heave +ve B.Sounds, Abd Soft, Non tender, No organomegaly appriciated, No rebound -guarding or rigidity. No Cyanosis, Clubbing or edema, No new Rash or bruise     Data Review  CBC  Lab 06/11/11 1846 06/11/11 1705 06/11/11 1323  WBC -- 12.1* 14.1*  HGB 7.4* 7.9* 8.5*  HCT 21.7* 23.0* 25.0*  PLT -- 171 171  MCV -- 93.9 95.4  MCH -- 32.2 32.4  MCHC -- 34.3 34.0  RDW -- 13.3 13.4  LYMPHSABS -- 2.5 1.4  MONOABS -- 1.1* 0.7  EOSABS -- 0.1 0.0  BASOSABS -- 0.0 0.0  BANDABS -- -- --    Chemistries   Lab 06/11/11 1323  NA 139  K 4.0  CL 109  CO2 23  GLUCOSE 105*  BUN 47*  CREATININE 0.62  CALCIUM 8.3*  MG --  AST --  ALT --  ALKPHOS --  BILITOT --    ------------------------------------------------------------------------------------------------------------------ estimated creatinine clearance is 82.2 ml/min (by C-G formula based on Cr of 0.62). ------------------------------------------------------------------------------------------------------------------ No results found for this basename: HGBA1C:2 in the last 72 hours ------------------------------------------------------------------------------------------------------------------ No results found for this basename: CHOL:2,HDL:2,LDLCALC:2,TRIG:2,CHOLHDL:2,LDLDIRECT:2 in the last 72 hours ------------------------------------------------------------------------------------------------------------------ No results found for this basename: TSH,T4TOTAL,FREET3,T3FREE,THYROIDAB in the last 72 hours ------------------------------------------------------------------------------------------------------------------ No results found for this basename: VITAMINB12:2,FOLATE:2,FERRITIN:2,TIBC:2,IRON:2,RETICCTPCT:2 in the last 72 hours  Coagulation profile No results found for this basename: INR:5,PROTIME:5 in the last 168 hours  No results found for this basename: DDIMER:2 in the last 72 hours  Cardiac Enzymes  Lab 06/11/11 1846  CKMB 3.0  TROPONINI <0.30  MYOGLOBIN --   ------------------------------------------------------------------------------------------------------------------ No results found for this basename: POCBNP:3 in the last 168 hours  Urine Studies No results found for this basename: UACOL:2,UAPR:2,USPG:2,UPH:2,UTP:2,UGL:2,UKET:2,UBIL:2,UHGB:2,UNIT:2,UROB:2,ULEU:2,UEPI:2,UWBC:2,URBC:2,UBAC:2,CAST:2,CRYS:2,UCOM:2,BILUA:2 in the last 72 hours  Micro Results No results found for this or any previous visit (from the past 240 hour(s)).  Radiology Reports Dg Chest 2 View  05/27/2011  *RADIOLOGY REPORT*  Clinical Data: Preoperative respiratory exam.  Shoulder spurs.  CHEST -  2 VIEW  Comparison: None.  Findings: The heart size and vascularity are normal and the lungs are clear.  No significant osseous abnormality.  No effusions.  IMPRESSION: Normal chest.  Original Report Authenticated By:  Gwynn Burly, M.D.   Dg Chest Port 1 View  06/11/2011  *RADIOLOGY REPORT*  Clinical Data: Syncope, shortness of breath.  PORTABLE CHEST - 1 VIEW  Comparison: None  Findings: Heart and mediastinal contours are within normal limits. No focal opacities or effusions.  No acute bony abnormality.  IMPRESSION: No active cardiopulmonary disease.  Original Report Authenticated By: Cyndie Chime, M.D.    Scheduled Meds:   . acetaminophen  650 mg Oral Once  . furosemide  10 mg Intravenous Q4H  . pantoprazole  40 mg Intravenous Once  . sodium chloride  500 mL Intravenous Once   Continuous Infusions:   . sodium chloride    . pantoprozole (PROTONIX) infusion    . DISCONTD: sodium chloride 125 mL/hr at 06/11/11 1820   PRN Meds:.diphenhydrAMINE  Assessment & Plan  Active Problems:  GERD  Syncope and collapse  Melena  Anemia  Azotemia  Volume depletion  Leukocytosis  On Call note  Came to re eval pt admitted for Acute Blood Loss related anemia- with Hypotension-tachycardia and weakness- H&H now 7.4 last set, dropped from 13.4 week ago, Haem Occ +ve, H/O melana, NSAID-ASA use.  Have started 2 units, 1 st over 2 hrs as looks pale and weak and feels weak and orthostatic, monitor H&H, stay 2 units ahead, have initiated PPI drip. GI to follow. Pt now to go to ICU as in CDU for hrs with no stepdown bed, i want him monitored closely.  Addendum - 6.20am after 2 units PRBC pt came up by 1gm only from 7.3-8.3, I suspect he continues to gently ooze, will give him another unit as he will certainly drop soon.

## 2011-06-11 NOTE — ED Provider Notes (Cosign Needed)
History     CSN: [id number] Arrival date & time: 06/11/2011 10:34 AM   First MD Initiated Contact with Patient 06/11/11 1043      Chief Complaint  Patient presents with  . Loss of Consciousness    (Consider location/radiation/quality/duration/timing/severity/associated sxs/prior treatment) Patient is a 77 y.o. male presenting with syncope. The history is provided by the patient. The history is limited by a language barrier.  Loss of Consciousness This is a recurrent problem. The current episode started less than 1 hour ago. The problem has been rapidly improving. Pertinent negatives include no chest pain, no abdominal pain and no shortness of breath. The symptoms are aggravated by walking. The symptoms are relieved by rest. He has tried nothing for the symptoms.   He has had urinary frequency for several days. He has not had fever. Last night, he went to the bathroom at 11:30 PM, felt dizzy, and fell to the floor. He was on the floor for an unknown period of time. This morning, he did not eat and went to his urologist office to be evaluated. Prior to being seen he went into the bathroom to urinate. He stood up from a wheelchair. He felt weak and sat back down, then passed tut. His wife called a nurse, and they can do him laid him down and  he aroused quickly. They sent him here for evaluation without doing any tests or treatments.  Past Medical History  Diagnosis Date  . GERD (gastroesophageal reflux disease)     nisseum fundoplication 2010  . Anemia   . BPH (benign prostatic hyperplasia)     Past Surgical History  Procedure Date  . Nissen fundoplication 2010  . Shoulder arthroscopy 1954    rt  . Abdominal surgery 1985    tummy tuc  . Colonoscopy     History reviewed. No pertinent family history.  History  Substance Use Topics  . Smoking status: Former Smoker    Quit date: 05/26/1966  . Smokeless tobacco: Not on file  . Alcohol Use: 0.6 oz/week    1 Shots of liquor per  week      Review of Systems  Respiratory: Negative for shortness of breath.   Cardiovascular: Positive for syncope. Negative for chest pain.  Gastrointestinal: Negative for abdominal pain.  All other systems reviewed and are negative.    Allergies  Review of patient's allergies indicates no known allergies.  Home Medications   Current Outpatient Rx  Name Route Sig Dispense Refill  . ASPIRIN 325 MG PO TBEC Oral Take 325 mg by mouth every other day.     . OMEGA-3 FATTY ACIDS 1000 MG PO CAPS Oral Take 2 g by mouth daily.      Marland Kitchen GLUCOSAMINE-CHONDROITIN 500-400 MG PO TABS Oral Take 1 tablet by mouth 2 (two) times daily between meals.     Marland Kitchen GRAPE SEED EXTRACT 100 MG PO CAPS Oral Take 1 capsule by mouth daily.      Marland Kitchen MESALAMINE 500 MG PO CPCR Oral Take 1,000 mg by mouth 3 (three) times daily.      . OCUVITE-LUTEIN PO CAPS Oral Take 1 capsule by mouth daily.     Marland Kitchen OVER THE COUNTER MEDICATION Oral Take 1 capsule by mouth daily. Beta Sitosterol capsule     . OVER THE COUNTER MEDICATION Oral Take 1 capsule by mouth daily. BLADDERWRACK.     Marland Kitchen TAMSULOSIN HCL 0.4 MG PO CAPS Oral Take 0.4 mg by mouth Nightly.  BP 123/79  Pulse 96  Temp(Src) 97.8 F (36.6 C) (Oral)  Resp 19  Ht 6' (1.829 m)  Wt 205 lb (92.987 kg)  BMI 27.80 kg/m2  SpO2 100%  Physical Exam  Constitutional: He is oriented to person, place, and time. He appears well-developed and well-nourished.  HENT:  Head: Normocephalic and atraumatic.  Right Ear: External ear normal.  Left Ear: External ear normal.  Eyes: Conjunctivae and EOM are normal. Pupils are equal, round, and reactive to light.  Neck: Normal range of motion. Neck supple.  Cardiovascular: Normal rate and regular rhythm.   Pulmonary/Chest: Effort normal and breath sounds normal.  Abdominal: Soft. Bowel sounds are normal.  Genitourinary: Rectal exam shows no external hemorrhoid, no fissure, no mass, no tenderness and anal tone normal.       Stool, black  color  Musculoskeletal:       Mild right shoulder, swelling and ecchymosis consistent with recent surgery. The skin wounds from the arthroscopic punctures are healed without infection or dehiscence  Neurological: He is alert and oriented to person, place, and time. No cranial nerve deficit. He exhibits normal muscle tone. Coordination normal.  Skin: Skin is warm and dry.  Psychiatric: He has a normal mood and affect. His behavior is normal. Judgment and thought content normal.    ED Course  Procedures (including critical care time)  Date: 06/11/2011  Rate: 84  Rhythm: normal sinus rhythm  QRS Axis: normal  Intervals: normal  ST/T Wave abnormalities: nonspecific T wave changes  Conduction Disutrbances:none  Narrative Interpretation:   Old EKG Reviewed: changes noted; new non-specific TWA  Labs Reviewed  CBC - Abnormal; Notable for the following:    WBC 14.1 (*)    RBC 2.62 (*)    Hemoglobin 8.5 (*)    HCT 25.0 (*)    All other components within normal limits  DIFFERENTIAL - Abnormal; Notable for the following:    Neutrophils Relative 85 (*)    Neutro Abs 12.0 (*)    Lymphocytes Relative 10 (*)    All other components within normal limits  BASIC METABOLIC PANEL - Abnormal; Notable for the following:    Glucose, Bld 105 (*)    BUN 47 (*)    Calcium 8.3 (*)    All other components within normal limits  URINALYSIS, ROUTINE W REFLEX MICROSCOPIC - Abnormal; Notable for the following:    Ketones, ur TRACE (*)    All other components within normal limits  OCCULT BLOOD, POC DEVICE  URINE CULTURE  POCT OCCULT BLOOD STOOL, DEVICE  CBC  DIFFERENTIAL   Dg Chest Port 1 View  06/11/2011  *RADIOLOGY REPORT*  Clinical Data: Syncope, shortness of breath.  PORTABLE CHEST - 1 VIEW  Comparison: None  Findings: Heart and mediastinal contours are within normal limits. No focal opacities or effusions.  No acute bony abnormality.  IMPRESSION: No active cardiopulmonary disease.  Original Report  Authenticated By: Cyndie Chime, M.D.     1. GI bleeding   2. Anemia   3. Syncope       MDM  Weakness and syncope, secondary to blood loss anemia. Apparent GI bleeding likely upper. She has been pain-free. Source is unclear, but likely represents peptic ulcer disease.        Flint Melter, MD 06/11/11 [phone]

## 2011-06-11 NOTE — ED Notes (Signed)
B/p 125/51   HR  82  R 20--Taken to Room 31 to await bed assignment

## 2011-06-11 NOTE — ED Notes (Addendum)
Syncopal episode in MD's office this a.m.--Also reports a syncopal episode last p.m. At home---Reports having a very dark b.m. Last p.m. And when questioned reports some abdominal cramping lately.  Lost bladder contents this a.m. During syncopal episode--Reports very frequent and urgent urination over the past week.  EMS reported he was very orthostatic on scene before IV fluids were initiated.

## 2011-06-11 NOTE — ED Notes (Signed)
Orders received for 2 units prbc, pt with irregular hr but rate controlled md aware,

## 2011-06-11 NOTE — ED Notes (Addendum)
1st unit prbc's started, vs at 2145 98.6 p 86 r 18 98 2l [place], 115/55, 1st unit # [id number] started at 50cc/hr at 2210

## 2011-06-11 NOTE — ED Notes (Signed)
Pt moved to TCU 31, A&O, IV infusing to Left hand without difficulty, no active bleeding noted, no complaints of pain, family at bedside, urinal given, placed on telemetry monitor, NSR 72

## 2011-06-12 ENCOUNTER — Encounter (HOSPITAL_COMMUNITY)
Admission: EM | Disposition: A | Discharge status: Home / Self Care | Payer: Self-pay | Source: Home / Self Care | Attending: Internal Medicine

## 2011-06-12 ENCOUNTER — Encounter (HOSPITAL_COMMUNITY): Payer: Self-pay | Admitting: Internal Medicine

## 2011-06-12 DIAGNOSIS — R578 Other shock: Secondary | ICD-10-CM

## 2011-06-12 DIAGNOSIS — K3182 Dieulafoy lesion (hemorrhagic) of stomach and duodenum: Secondary | ICD-10-CM

## 2011-06-12 DIAGNOSIS — K921 Melena: Secondary | ICD-10-CM

## 2011-06-12 DIAGNOSIS — D62 Acute posthemorrhagic anemia: Secondary | ICD-10-CM

## 2011-06-12 DIAGNOSIS — I509 Heart failure, unspecified: Secondary | ICD-10-CM

## 2011-06-12 DIAGNOSIS — I369 Nonrheumatic tricuspid valve disorder, unspecified: Secondary | ICD-10-CM

## 2011-06-12 DIAGNOSIS — D649 Anemia, unspecified: Secondary | ICD-10-CM

## 2011-06-12 HISTORY — PX: ESOPHAGOGASTRODUODENOSCOPY: SHX5428

## 2011-06-12 LAB — HEMOGLOBIN AND HEMATOCRIT, BLOOD
HCT: 27.7 % — ABNORMAL LOW (ref 39.0–52.0)
Hemoglobin: 9.6 g/dL — ABNORMAL LOW (ref 13.0–17.0)
Hemoglobin: 8.9 g/dL — ABNORMAL LOW (ref 13.0–17.0)

## 2011-06-12 LAB — URINE CULTURE
Colony Count: NO GROWTH
Culture  Setup Time: 201211292111
Culture: NO GROWTH

## 2011-06-12 LAB — CBC
HCT: 24.1 % — ABNORMAL LOW (ref 39.0–52.0)
Hemoglobin: 8.3 g/dL — ABNORMAL LOW (ref 13.0–17.0)
MCH: 30.9 pg (ref 26.0–34.0)
MCHC: 34.4 g/dL (ref 30.0–36.0)

## 2011-06-12 LAB — BASIC METABOLIC PANEL
BUN: 34 mg/dL — ABNORMAL HIGH (ref 6–23)
Calcium: 7.9 mg/dL — ABNORMAL LOW (ref 8.4–10.5)
Creatinine, Ser: 0.78 mg/dL (ref 0.50–1.35)
GFR calc non Af Amer: 84 mL/min — ABNORMAL LOW (ref >90)
Glucose, Bld: 92 mg/dL (ref 70–99)

## 2011-06-12 SURGERY — EGD (ESOPHAGOGASTRODUODENOSCOPY)
Anesthesia: Moderate Sedation

## 2011-06-12 SURGERY — Surgical Case
Anesthesia: *Unknown

## 2011-06-12 MED ORDER — DIPHENHYDRAMINE HCL 50 MG/ML IJ SOLN: 25.0000 mg | Freq: Four times a day (QID) | INTRAMUSCULAR | Status: DC | PRN

## 2011-06-12 MED ORDER — MIDAZOLAM HCL 10 MG/2ML IJ SOLN
INTRAMUSCULAR | Status: DC | PRN
  Administered 2011-06-12 (×3): 1 mg via INTRAVENOUS
  Administered 2011-06-12: 2 mg via INTRAVENOUS

## 2011-06-12 MED ORDER — ONDANSETRON HCL 4 MG/2ML IJ SOLN: 4.0000 mg | Freq: Three times a day (TID) | INTRAMUSCULAR | Status: AC | PRN

## 2011-06-12 MED ORDER — MIDAZOLAM HCL 10 MG/2ML IJ SOLN
INTRAMUSCULAR | Status: AC
  Filled 2011-06-12: qty 2

## 2011-06-12 MED ORDER — ONDANSETRON HCL 4 MG PO TABS: 4.0000 mg | ORAL_TABLET | Freq: Four times a day (QID) | ORAL | Status: DC | PRN

## 2011-06-12 MED ORDER — FENTANYL CITRATE 0.05 MG/ML IJ SOLN
INTRAMUSCULAR | Status: AC
  Filled 2011-06-12: qty 2

## 2011-06-12 MED ORDER — ONDANSETRON HCL 4 MG/2ML IJ SOLN: 4.0000 mg | Freq: Four times a day (QID) | INTRAMUSCULAR | Status: DC | PRN

## 2011-06-12 MED ORDER — FUROSEMIDE 10 MG/ML IJ SOLN
10.0000 mg | Freq: Once | INTRAMUSCULAR | Status: AC
  Administered 2011-06-12: 10 mg via INTRAVENOUS
  Filled 2011-06-12: qty 2

## 2011-06-12 MED ORDER — BUTAMBEN-TETRACAINE-BENZOCAINE 2-2-14 % EX AERO
INHALATION_SPRAY | CUTANEOUS | Status: DC | PRN
  Administered 2011-06-12: 2 via TOPICAL

## 2011-06-12 MED ORDER — FENTANYL NICU IV SYRINGE 50 MCG/ML
INJECTION | INTRAMUSCULAR | Status: DC | PRN
  Administered 2011-06-12 (×2): 25 ug via INTRAVENOUS
  Administered 2011-06-12 (×2): 12.5 ug via INTRAVENOUS

## 2011-06-12 MED ORDER — SODIUM CHLORIDE 0.9 % IJ SOLN
INTRAMUSCULAR | Status: DC | PRN
  Administered 2011-06-12: 11:00:00

## 2011-06-12 MED ORDER — SODIUM CHLORIDE 0.9 % IV SOLN: INTRAVENOUS | Status: AC

## 2011-06-12 MED ORDER — PANTOPRAZOLE SODIUM 40 MG IV SOLR: 40.0000 mg | Freq: Two times a day (BID) | INTRAVENOUS | Status: DC

## 2011-06-12 MED ORDER — EPINEPHRINE HCL 0.1 MG/ML IJ SOLN
INTRAMUSCULAR | Status: AC
  Filled 2011-06-12: qty 10

## 2011-06-12 MED ORDER — NITROGLYCERIN 0.4 MG SL SUBL: 0.4000 mg | SUBLINGUAL_TABLET | SUBLINGUAL | Status: DC | PRN

## 2011-06-12 NOTE — ED Notes (Signed)
0020 vs post blood tx t 98.6 b[ 108/58 t 98.6 rr 18 98 % 2l

## 2011-06-12 NOTE — Progress Notes (Signed)
Name: Lucas Shepherd MRN: [id number] DOB: [birth year]    LOS: 1  PCCM ADMISSION NOTE  History of Present Illness:  77 year male admitted on 11/29 with acute GI bleed and hgb trending from 8.4-->7.4. Admitted to the ICU in the setting of shock. PCCM asked to provide critical care support on 11/30 by Dr Eldridge Scot / Drains:   Cultures:   Antibiotics:   Tests / Events: Stat ECHO 11/30: Normal RV>>> EGD 11/30: non bleeding Deaulafoys lesion injected w/ epi.   HPI Lucas Shepherd is a 77 y.o. Male admitted on 11/30  With hx of GERD, S/p Nissen Fundoplication 2010, who has done very well since. He had a shoulder surgery about 2 weeks prior, and took 600 mg Ibuprofen For several doses afterward. He also takes an ASA evry other day. He had onset on WED 11/28 with a syncopal episode in the bathroom during the night, did not pass any stool until 29th morning-large black stool. He went to see his primary and had another episode of syncope while there, and admitted. He was found to have a hgb of 8.5. His hgb dropped to 7.4 during the night. He was transfused 2 units, and hgb only 8.3 this am.He is currently hemodynamically stable, has not had any further BM's since admit. He is reeiving his 3rd unit of blood. He was admitted to the ICU. Transfused and brought to ENDO. PCCM asked to eval.    Past Medical History  Diagnosis Date  . GERD (gastroesophageal reflux disease)     nisseum fundoplication 2010  . Anemia   . BPH (benign prostatic hyperplasia)   . Kidney stones     History of  . History of urinary frequency   . Urinary urgency   . Hiatal hernia   . History of TMJ syndrome     mild   Past Surgical History  Procedure Date  . Nissen fundoplication 2010  . Shoulder arthroscopy 1954    rt  . Abdominal surgery 1985    tummy tuc  . Colonoscopy   . Cataract extraction, bilateral    Prior to Admission medications   Medication Sig Start Date End Date Taking? Authorizing  Provider  aspirin 325 MG EC tablet Take 325 mg by mouth every other day.    Yes Historical Provider, MD  fish oil-omega-3 fatty acids 1000 MG capsule Take 2 g by mouth daily.     Yes Historical Provider, MD  glucosamine-chondroitin 500-400 MG tablet Take 1 tablet by mouth 2 (two) times daily between meals.    Yes Historical Provider, MD  Grape Seed Extract 100 MG CAPS Take 1 capsule by mouth daily.     Yes Historical Provider, MD  mesalamine (PENTASA) 500 MG CR capsule Take 1,000 mg by mouth 3 (three) times daily.     Yes Historical Provider, MD  multivitamin-lutein (OCUVITE-LUTEIN) CAPS Take 1 capsule by mouth daily.    Yes Historical Provider, MD  OVER THE COUNTER MEDICATION Take 1 capsule by mouth daily. Beta Sitosterol capsule    Yes Historical Provider, MD  OVER THE COUNTER MEDICATION Take 1 capsule by mouth daily. BLADDERWRACK.    Yes Historical Provider, MD  Tamsulosin HCl (FLOMAX) 0.4 MG CAPS Take 0.4 mg by mouth Nightly.    Yes Historical Provider, MD   Allergies No Known Allergies  Family History History reviewed. No pertinent family history.  Social History  reports that he quit smoking about 45 years ago. His smoking use included  Cigarettes. He has a 12 pack-year smoking history. He has never used smokeless tobacco. He reports that he drinks about .6 ounces of alcohol per week. He reports that he does not use illicit drugs.  Review Of Systems  11 points review of systems is negative with an exception of listed in HPI.  Vital Signs: Temp:  [97.3 F (36.3 C)-99.5 F (37.5 C)] 98.6 F (37 C) (11/30 1115) Pulse Rate:  [68-88] 81  (11/30 1400) Resp:  [13-26] 20  (11/30 1400) BP: (87-136)/(29-104) 107/63 mmHg (11/30 1400) SpO2:  [93 %-100 %] 100 % (11/30 1400) FiO2 (%):  [2 %] 2 % (11/30 0020) Weight:  [94.2 kg (207 lb 10.8 oz)] 207 lb 10.8 oz (94.2 kg) (11/30 0700) I/O last 3 completed shifts: In: 250 [I.V.:250] Out: 200 [Urine:200]  Physical Examination: General: well  developed White male, currently in no acute distress s/p ENDO. HENT: [place] no JVD. No adenopathy. Pulm: clear. Card: RRR  Abd: soft, NT No OM GU voids. Neuro Iintact  Labs and Imaging:  Reviewed.  Please refer to the Assessment and Plan section for relevant results.  Assessment and Plan: Upper GI bleed w/ acute blood loss anemia in the setting of gastric ulcer exacerbated by NSAIDs. Now s/p epi injection on 11/30. Hgb stabilized.  Lab 06/12/11 1200 06/12/11 0509 06/11/11 1846  HGB 9.6* 8.3* 7.4*  plan: -PPI infusion per GI -clear liquids -watch in ICU over night -no more NSAIDs   Syncope and collapse: 2/2 above (resolved)   Leukocytosis  Lab 06/12/11 0509 06/11/11 1705 06/11/11 1323  WBC 9.7 12.1* 14.1*  plan: -f/u wbc in am  Best practices / Disposition: -->ICU status under PCCM -->full code -->PAS for DVT Px -->Protonix gtt -->diet: clears -->family updated at bedside  The patient is critically ill with multiple organ systems failure and requires high complexity decision making for assessment and support, frequent evaluation and titration of therapies, application of advanced monitoring technologies and extensive interpretation of multiple databases. Critical Care Time devoted to patient care services described in this note is   minutes.  Seen with Kreg Shropshire, NP  Sandrea Hughs, MD Pulmonary and Critical Care Medicine [facility] Cell [phone]

## 2011-06-12 NOTE — ED Notes (Signed)
2nd unit prbc's started at 0040, vs at 0020 98.6 bp 108/58 t 98.6 rr 18, rate started at 50

## 2011-06-12 NOTE — ED Notes (Signed)
0055 no reaction noted, blood tx increased to 175 per md order to tx over 2 hours, vs stable at bp 100/51 p 80 rr 18 100% on 2l [place]

## 2011-06-12 NOTE — ED Notes (Signed)
Dr. Leta Speller notified of results of hgb 8.3 this am, orders received,

## 2011-06-12 NOTE — ED Notes (Signed)
Report given to dandra in icu, pt to tx to 1225, currently resting, no needs at this time

## 2011-06-12 NOTE — Progress Notes (Signed)
Ur review completed  

## 2011-06-12 NOTE — Progress Notes (Signed)
Overnight patient got two units of PRBCs and had a bump in Hb from 7.4 to 8.3. However, he is still pretty tachy and presyncopal when he tries to get up. He has not had any more melena overnight. Decision made by hospitalist on call to change from tele to ICU admit.   I spoke with Dr. Delton Coombes and asked CCM to assume care while in ICU. I told him I would call [facility] on call and be sure they saw patient early as I think he may need to be seen sooner rather than later.

## 2011-06-12 NOTE — Consult Note (Signed)
Referring Provider: [facility] Primary Care Physician:  Darnelle Bos, MD, MD Primary Gastroenterologist:  Dr. Jarold Motto  Reason for Consultation:   Acute GI Bleed  HPI: Lucas Shepherd is a 77 y.o. male  With hx of GERD, S/p Nissen Fundoplication  2010, who has done very well since. He had a shoulder surgery about 2 weeks ago, and took 600 mg Ibuprofen  For several doses afterward. He also takes an ASA evry other day. He had onset on WED 11/28 with a syncopal episode in the bathroom during the night, did not pass any stool until yesterday morning-large black stool. He went to see his primary and had another episode of syncope while there, and admitted. He was found to have a hgb of 8.5. His hgb dropped to 7.4 during the night. He was transfused 2 units, and hgb only 8.3 this am.He is currently hemodynamically stable, has not had any further BM's since admit.  He is reeiving his 3rd unit of blood. He denies any CP, SOB, abdominal pain.   Past Medical History  Diagnosis Date  . GERD (gastroesophageal reflux disease)     nisseum fundoplication 2010  . Anemia   . BPH (benign prostatic hyperplasia)     Past Surgical History  Procedure Date  . Nissen fundoplication 2010  . Shoulder arthroscopy 1954    rt  . Abdominal surgery 1985    tummy tuc  . Colonoscopy     Prior to Admission medications   Medication Sig Start Date End Date Taking? Authorizing Provider  aspirin 325 MG EC tablet Take 325 mg by mouth every other day.    Yes Historical Provider, MD  fish oil-omega-3 fatty acids 1000 MG capsule Take 2 g by mouth daily.     Yes Historical Provider, MD  glucosamine-chondroitin 500-400 MG tablet Take 1 tablet by mouth 2 (two) times daily between meals.    Yes Historical Provider, MD  Grape Seed Extract 100 MG CAPS Take 1 capsule by mouth daily.     Yes Historical Provider, MD  mesalamine (PENTASA) 500 MG CR capsule Take 1,000 mg by mouth 3 (three) times daily.     Yes  Historical Provider, MD  multivitamin-lutein (OCUVITE-LUTEIN) CAPS Take 1 capsule by mouth daily.    Yes Historical Provider, MD  OVER THE COUNTER MEDICATION Take 1 capsule by mouth daily. Beta Sitosterol capsule    Yes Historical Provider, MD  OVER THE COUNTER MEDICATION Take 1 capsule by mouth daily. BLADDERWRACK.    Yes Historical Provider, MD  Tamsulosin HCl (FLOMAX) 0.4 MG CAPS Take 0.4 mg by mouth Nightly.    Yes Historical Provider, MD    Current Facility-Administered Medications  Medication Dose Route Frequency Provider Last Rate Last Dose  . 0.9 %  sodium chloride infusion   Intravenous STAT Flint Melter, MD      . 0.9 %  sodium chloride infusion   Intravenous Continuous Adeline C Viyuoh      . acetaminophen (TYLENOL) tablet 650 mg  650 mg Oral Once Leroy Sea, MD   650 mg at 06/11/11 2230  . diphenhydrAMINE (BENADRYL) injection 25 mg  25 mg Intravenous Q6H PRN Leroy Sea, MD   25 mg at 06/11/11 2203  . furosemide (LASIX) injection 10 mg  10 mg Intravenous Once Leroy Sea, MD   10 mg at 06/12/11 0012  . furosemide (LASIX) injection 10 mg  10 mg Intravenous Once Leroy Sea, MD      .  nitroGLYCERIN (NITROSTAT) SL tablet 0.4 mg  0.4 mg Sublingual Q5 Min x 3 PRN Adeline C Viyuoh      . ondansetron (ZOFRAN) injection 4 mg  4 mg Intravenous Q8H PRN Flint Melter, MD      . ondansetron [facility]) tablet 4 mg  4 mg Oral Q6H PRN Adeline C Viyuoh      . pantoprazole (PROTONIX) 80 mg in sodium chloride 0.9 % 250 mL infusion  8 mg/hr Intravenous Continuous Leroy Sea, MD 25 mL/hr at 06/12/11 0435 8 mg/hr at 06/12/11 0435  . pantoprazole (PROTONIX) injection 40 mg  40 mg Intravenous Once Flint Melter, MD   40 mg at 06/11/11 1501  . sodium chloride 0.9 % bolus 500 mL  500 mL Intravenous Once Flint Melter, MD   500 mL at 06/11/11 1200  . DISCONTD: 0.9 %  sodium chloride infusion   Intravenous Continuous Flint Melter, MD      . DISCONTD: diphenhydrAMINE  (BENADRYL) injection 25 mg  25 mg Intravenous Q6H PRN Leroy Sea, MD      . DISCONTD: furosemide (LASIX) injection 10 mg  10 mg Intravenous Q4H Leroy Sea, MD      . DISCONTD: ondansetron (ZOFRAN) injection 4 mg  4 mg Intravenous Q6H PRN Adeline C Viyuoh      . DISCONTD: pantoprazole (PROTONIX) injection 40 mg  40 mg Intravenous Q12H Adeline C Viyuoh        Allergies as of 06/11/2011  . (No Known Allergies)    History reviewed. No pertinent family history.  History   Social History  . Marital Status: Married    Spouse Name: N/A    Number of Children: N/A  . Years of Education: N/A   Occupational History  . Not on file.   Social History Main Topics  . Smoking status: Former Smoker    Quit date: 05/26/1966  . Smokeless tobacco: Not on file  . Alcohol Use: 0.6 oz/week    1 Shots of liquor per week  . Drug Use: No  . Sexually Active:    Other Topics Concern  . Not on file   Social History Narrative  . No narrative on file    Review of Systems Pertinent positive and negative review of systems were noted in the above HPI section.  All other review of systems was otherwise negative. Pt had recent left shoulder arthroscopy, still sore :Vital signs in last 24 hours: Temp:  [97.3 F (36.3 C)-99.5 F (37.5 C)] 97.3 F (36.3 C) (11/30 0854) Pulse Rate:  [73-96] 73  (11/30 0854) Resp:  [17-21] 17  (11/30 0854) BP: (98-140)/(44-79) 120/60 mmHg (11/30 0854) SpO2:  [94 %-100 %] 100 % (11/30 0252) FiO2 (%):  [2 %] 2 % (11/30 0020) Weight:  [92.987 kg (205 lb)-94.2 kg (207 lb 10.8 oz)] 207 lb 10.8 oz (94.2 kg) (11/30 0700)   General:   Alert,  Well-developed, well-nourished, pleasant and cooperative in NAD, slightly pale, skin w/d. Wife at bedside Head:  Normocephalic and atraumatic. Eyes:  Sclera clear, no icterus.   Conjunctiva pink. Ears:  Normal auditory acuity. Nose:  No deformity, discharge,  or lesions. Mouth:  No deformity or lesions.  Oropharynx pink &  moist. Neck:  Supple; no masses or thyromegaly. Lungs:  Clear throughout to auscultation.   No wheezes, crackles, or rhonchi. No acute distress. Heart:  Regular rate and rhythm; no murmurs, clicks, rubs,  or gallops. Abdomen:  Soft, nontender and nondistended.  No masses, hepatosplenomegaly or hernias noted. ,BS+ Rectal:  Not done  . Pulses:  Normal pulses noted. Extremities:  Without clubbing or edema. Neurologic:  Alert and  oriented x4;  grossly normal neurologically.  Marland Kitchen Psych:  Alert and cooperative. Normal mood and affect.  Intake/Output from previous day: 11/29 0701 - 11/30 0700 In: 250 [I.V.:250] Out: 200 [Urine:200] Intake/Output this shift: Total I/O In: 250 [I.V.:250] Out: 150 [Urine:150]  Lab Results:  Basename 06/12/11 0509 06/11/11 1846 06/11/11 1705 06/11/11 1323  WBC 9.7 -- 12.1* 14.1*  HGB 8.3* 7.4* 7.9* --  HCT 24.1* 21.7* 23.0* --  PLT 129* -- 171 171   BMET  Basename 06/12/11 0509 06/11/11 1323  NA 138 139  K 3.5 4.0  CL 110 109  CO2 25 23  GLUCOSE 92 105*  BUN 34* 47*  CREATININE 0.78 0.62  CALCIUM 7.9* 8.3*      l     Studies/Results: Dg Chest Port 1 View  06/11/2011  *RADIOLOGY REPORT*  Clinical Data: Syncope, shortness of breath.  PORTABLE CHEST - 1 VIEW  Comparison: None  Findings: Heart and mediastinal contours are within normal limits. No focal opacities or effusions.  No acute bony abnormality.  IMPRESSION: No active cardiopulmonary disease.  Original Report Authenticated By: Cyndie Chime, M.D.    Impression: #1 77 yo male with acute UGI bleed,complicated by syncope. Suspect ASA/NSAID  Induced,PUD. #2 S/P NIssen fundoplication 2010 #3 Anemia -Secondary to acute blood loss #4 Hx diverticulosis   Plan: ON protonix infusion Stat hgb after 3rd unit, transfuse to keep hgb above 9. EGD  asap this am, discussed with pt and wife.  Stop asa, NSaids   Amy Esterwood  06/12/2011, 9:03 AM  GI ATTENDING HX REVIEWED, PATIENT  PERSONALLY INTERVIEWED AND EXAMINED. AGREE WITH H&P AS OUTLINED ABOVE. PATIENT PRESENTS WITH SYNCOPE SECONDARY TO UGI BLEED WITH ASSOCIATED ANEMIA DUE TO BLOOD LOSS. NOW STABLE. HAD PRIOR NISSAN. R/O NSAID ULCER, ULCER AT WRAP, DIEULAFOYS, ETC. PLAN EGD THIS AM.The nature of the procedure, as well as the risks, benefits, and alternatives were carefully and thoroughly reviewed with the patient. Ample time for discussion and questions allowed. The patient understood, was satisfied, and agreed to proceed. THANKS... JNP

## 2011-06-12 NOTE — ED Notes (Signed)
0010 1st unit prbc completed, no reaction noted

## 2011-06-12 NOTE — Progress Notes (Signed)
*  PRELIMINARY RESULTS* Echocardiogram 2D Echocardiogram has been performed.  Glean Salen [facility] 06/12/2011, 11:58 AM

## 2011-06-13 ENCOUNTER — Inpatient Hospital Stay (HOSPITAL_COMMUNITY): Payer: Medicare Other

## 2011-06-13 DIAGNOSIS — K922 Gastrointestinal hemorrhage, unspecified: Secondary | ICD-10-CM

## 2011-06-13 HISTORY — DX: Gastrointestinal hemorrhage, unspecified: K92.2

## 2011-06-13 LAB — SODIUM, URINE, RANDOM: Sodium, Ur: 64 mEq/L

## 2011-06-13 LAB — HEMOGLOBIN AND HEMATOCRIT, BLOOD
HCT: 26.9 % — ABNORMAL LOW (ref 39.0–52.0)
HCT: 26.2 % — ABNORMAL LOW (ref 39.0–52.0)
Hemoglobin: 8.8 g/dL — ABNORMAL LOW (ref 13.0–17.0)
Hemoglobin: 9.1 g/dL — ABNORMAL LOW (ref 13.0–17.0)

## 2011-06-13 LAB — CBC
Hemoglobin: 8.6 g/dL — ABNORMAL LOW (ref 13.0–17.0)
MCH: 31.5 pg (ref 26.0–34.0)
MCHC: 35 g/dL (ref 30.0–36.0)
RDW: 15.6 % — ABNORMAL HIGH (ref 11.5–15.5)

## 2011-06-13 LAB — BASIC METABOLIC PANEL
Calcium: 7.9 mg/dL — ABNORMAL LOW (ref 8.4–10.5)
GFR calc Af Amer: >90 mL/min (ref >90)
GFR calc non Af Amer: 87 mL/min — ABNORMAL LOW (ref >90)
Glucose, Bld: 99 mg/dL (ref 70–99)
Sodium: 136 mEq/L (ref 135–145)

## 2011-06-13 IMAGING — CR DG SHOULDER 2+V PORT*R*
1 series · 3 of 3 positions shown · non-contrast
Comparison: None.

CLINICAL DATA: The right shoulder pain.  Fall at home.

PORTABLE RIGHT SHOULDER - 2+ VIEW

[Series 1: ap int/ext rotation · right · 3 of 3 slices shown]
[im 1/3]
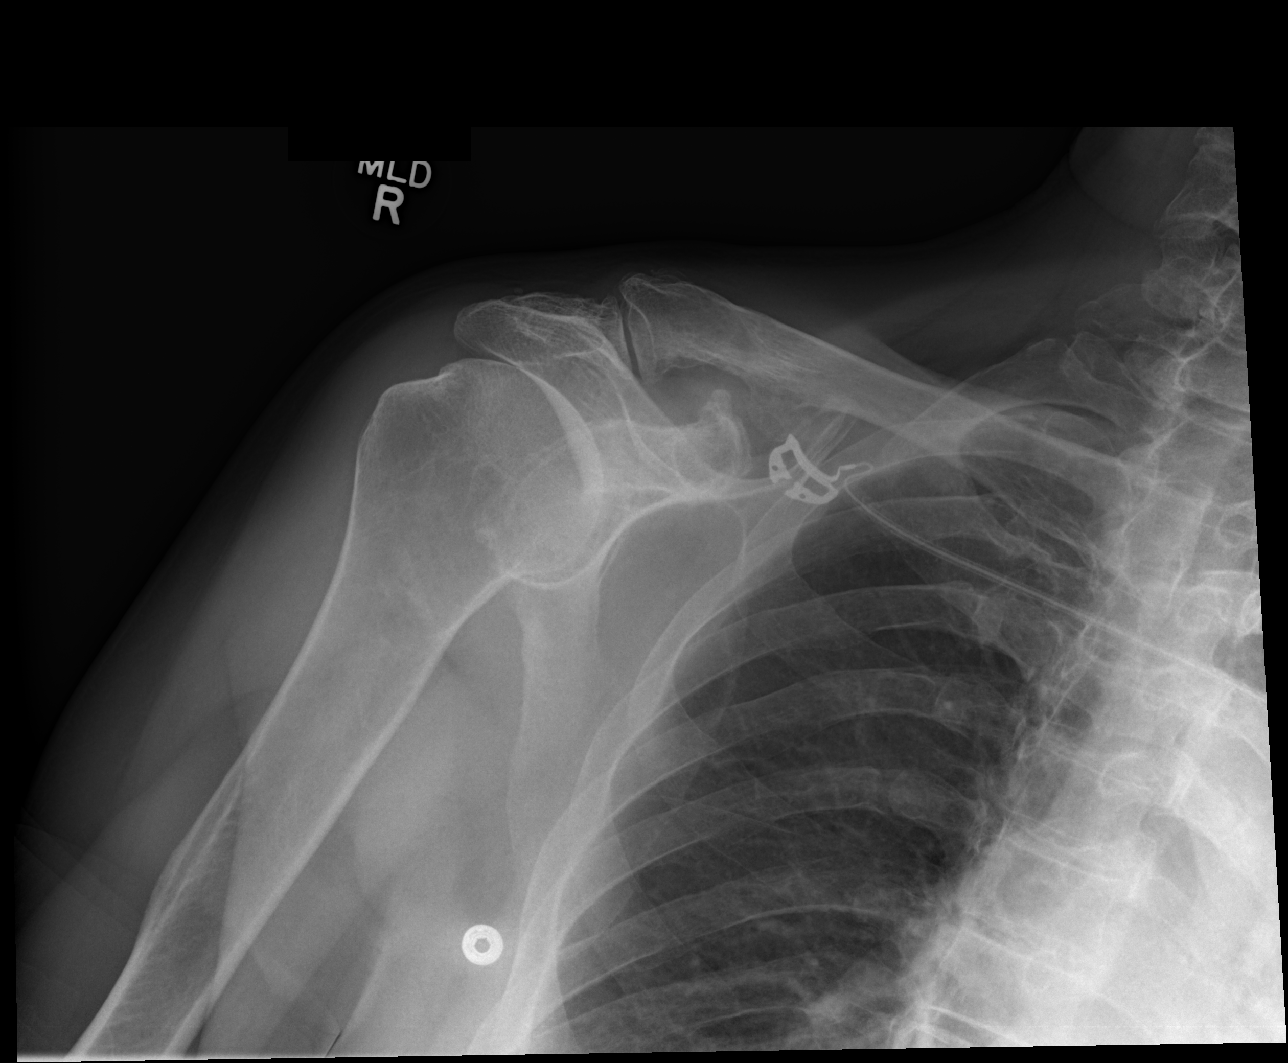
[im 2/3]
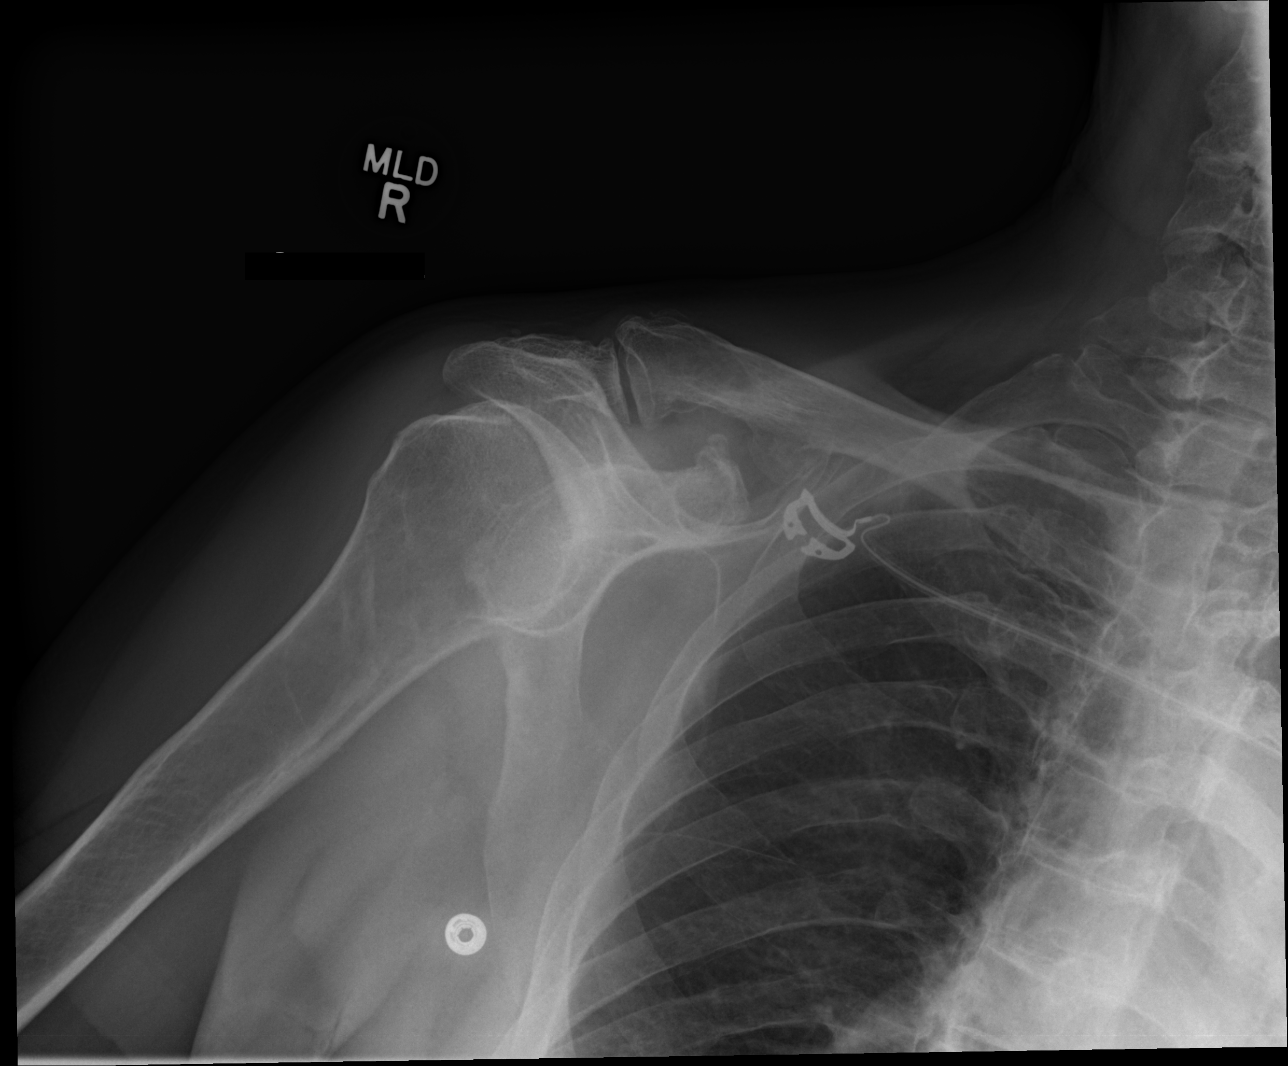
[im 3/3]
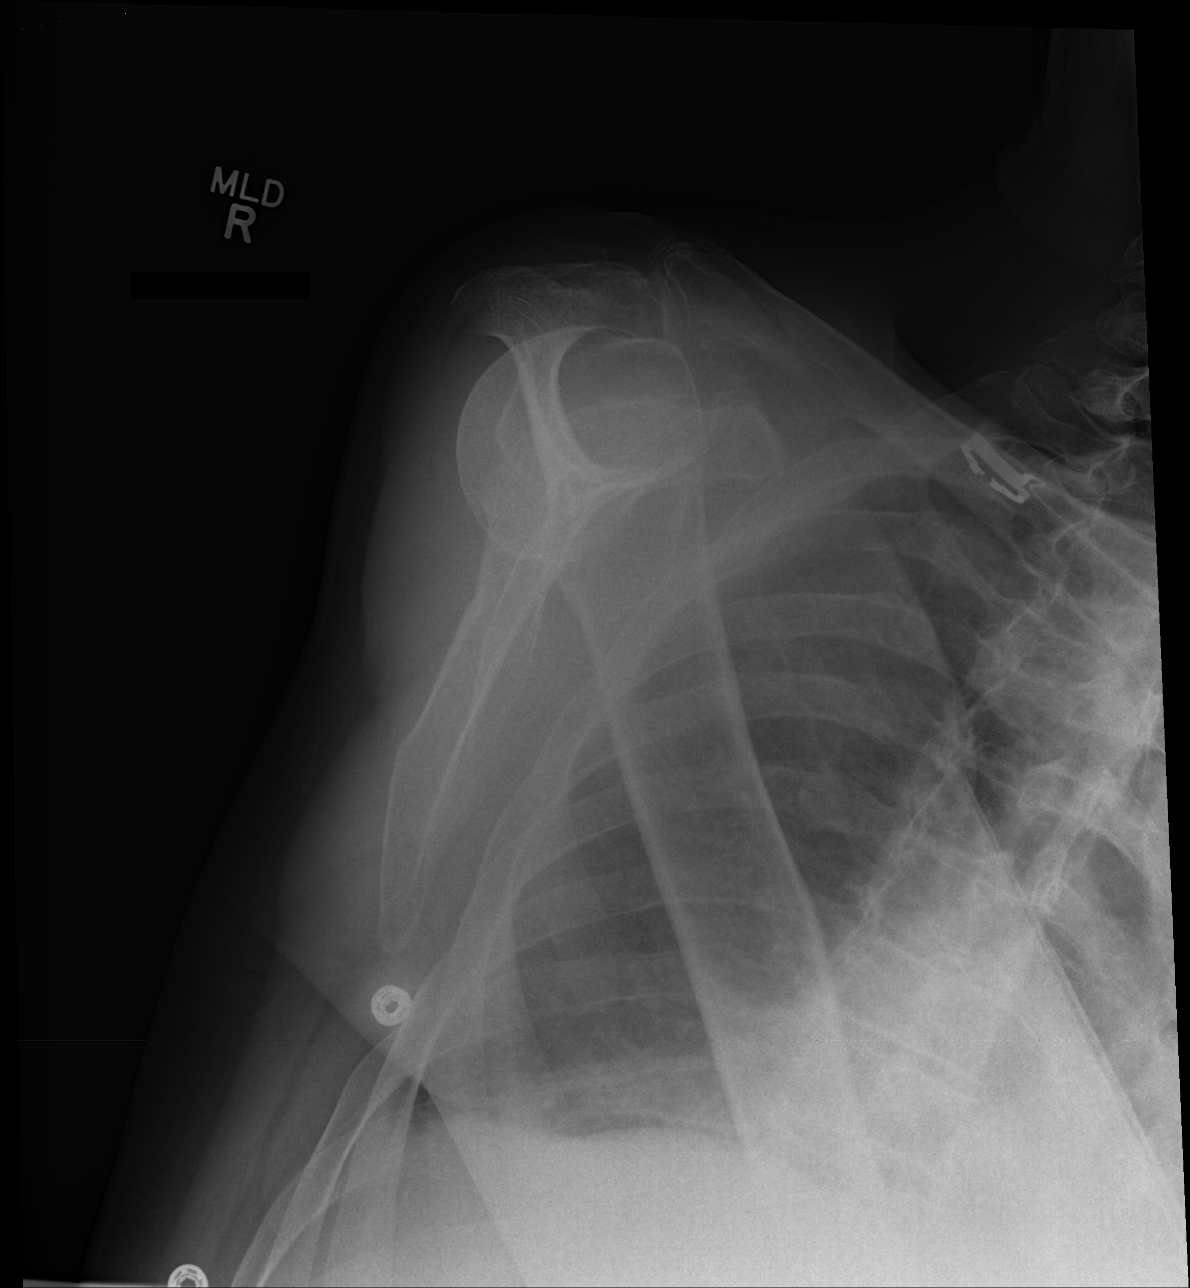

[3 of 3 positions shown; findings below may reference images not displayed]

FINDINGS: The humeral head is located.  No evidence of subluxation
or dislocation.  Acromioclavicular joint is aligned and
characterized by degenerative change.  No acute bony abnormality is
identified.  The visualized right ribs are unremarkable.
IMPRESSION: 1.  No acute bony abnormality of the right shoulder.
2.  Degenerative change of the acromioclavicular joint.

## 2011-06-13 MED ORDER — OXYCODONE-ACETAMINOPHEN 5-325 MG PO TABS
1.0000 | ORAL_TABLET | ORAL | Status: DC | PRN
  Administered 2011-06-13: 1 via ORAL
  Filled 2011-06-13: qty 1

## 2011-06-13 MED ORDER — TAMSULOSIN HCL 0.4 MG PO CAPS
0.4000 mg | ORAL_CAPSULE | Freq: Every day | ORAL | Status: DC
  Administered 2011-06-13 – 2011-06-14 (×2): 0.4 mg via ORAL
  Filled 2011-06-13 (×4): qty 1

## 2011-06-13 MED ORDER — POTASSIUM CHLORIDE CRYS ER 20 MEQ PO TBCR
40.0000 meq | EXTENDED_RELEASE_TABLET | Freq: Once | ORAL | Status: AC
  Administered 2011-06-13: 40 meq via ORAL
  Filled 2011-06-13 (×2): qty 1

## 2011-06-13 NOTE — Progress Notes (Signed)
Patient ID: Lucas Shepherd, male   DOB: 07/20/1928, 77 y.o.   MRN: [id number] [facility] Progress Note  Subjective: Mr. Bozzi is doing weel. No c/o pain or nausea. He has not had any Bm's.  Hgb this am  Is 8.6  Objective:  Vital signs in last 24 hours: Temp:  [97.2 F (36.2 C)-98.7 F (37.1 C)] 97.7 F (36.5 C) (12/01 0800) Pulse Rate:  [66-81] 73  (11/30 1900) Resp:  [13-26] 16  (11/30 1900) BP: (87-136)/(29-104) 110/50 mmHg (12/01 0600) SpO2:  [93 %-100 %] 99 % (11/30 1900)   General:   Alert,  Well-developed,  [place] in NAD Heart:  Regular rate and rhythm,soft syt murmur Abdomen:  Soft, nontender and nondistended. Normal bowel sounds, without guarding, and without rebound.   Extremities:  Without edema. Neurologic:  Alert and  oriented x4;  grossly normal neurologically. Psych:  Alert and cooperative. Normal mood and affect.  Intake/Output from previous day: 11/30 0701 - 12/01 0700 In: 3111 [P.O.:960; I.V.:2150; IV Piggyback:1] Out: 4070 [Urine:4070] Intake/Output this shift:    Lab Results:  Basename 06/13/11 0305 06/13/11 0043 06/12/11 1800 06/12/11 0509 06/11/11 1705  WBC 7.3 -- -- 9.7 12.1*  HGB 8.6* 8.8* 8.9* -- --  HCT 24.6* 25.8* 25.3* -- --  PLT 138* -- -- 129* 171   BMET  Basename 06/13/11 0305 06/12/11 0509 06/11/11 1323  NA 136 138 139  K 3.3* 3.5 4.0  CL 108 110 109  CO2 23 25 23   GLUCOSE 99 92 105*  BUN 16 34* 47*  CREATININE 0.71 0.78 0.62  CALCIUM 7.9* 7.9* 8.3*        Studies/Results: Dg Chest Port 1 View  06/11/2011  *RADIOLOGY REPORT*  Clinical Data: Syncope, shortness of breath.  PORTABLE CHEST - 1 VIEW  Comparison: None  Findings: Heart and mediastinal contours are within normal limits. No focal opacities or effusions.  No acute bony abnormality.  IMPRESSION: No active cardiopulmonary disease.  Original Report Authenticated By: Cyndie Chime, M.D.     Assessment / Plan: #1 77 yo male s/p acute major UGI  bleed secondary to Dieulafoy lesion of gastric cardia. S/p injection  And endoclipping 11/30.  He is stable, without any evidence for active bleeding. Will start full liquids Ok to transfer to regular bed Continue protonix infusion x 72 hours Follow hgb, and transfuse for hgb 8.0 or less     LOS: 2 days   Amy Esterwood  06/13/2011, 8:50 AM   SEEN AND AGREE. WILL CONTINUE TO FOLLOW... JNP

## 2011-06-13 NOTE — Progress Notes (Signed)
Name: Lucas Shepherd MRN: [id number] DOB: 1929-03-27    LOS: 2  PCCM ADMISSION NOTE  History of Present Illness:  77 year male admitted on 11/29 with acute GI bleed and hgb trending from 8.4-->7.4. Admitted to the ICU in the setting of shock. PCCM asked to provide critical care support on 11/30 by Dr Eldridge Scot / Drains:   Cultures:   Antibiotics:   Tests / Events: Stat ECHO 11/30: Normal RV>>> EGD 11/30: non bleeding Deaulafoys lesion injected w/ epi.   HPI Lucas Shepherd is a 77 y.o. Male admitted on 11/30  With hx of GERD, S/p Nissen Fundoplication 2010, who has done very well since. He had a shoulder surgery about 2 weeks prior, and took 600 mg Ibuprofen For several doses afterward. He also takes an ASA evry other day. He had onset on WED 11/28 with a syncopal episode in the bathroom during the night, did not pass any stool until 29th morning-large black stool. He went to see his primary and had another episode of syncope while there, and admitted. He was found to have a hgb of 8.5. His hgb dropped to 7.4 during the night. He was transfused 2 units, and hgb only 8.3 this am.He is currently hemodynamically stable, has not had any further BM's since admit. He is reeiving his 3rd unit of blood. He was admitted to the ICU. Transfused and brought to ENDO. PCCM asked to eval.   Overnight: no bleeding clinically overnight  Vital Signs: Temp:  [97.2 F (36.2 C)-98.7 F (37.1 C)] 98.5 F (36.9 C) (12/01 0400) Pulse Rate:  [66-81] 73  (11/30 1900) Resp:  [13-26] 16  (11/30 1900) BP: (87-136)/(29-104) 110/50 mmHg (12/01 0600) SpO2:  [93 %-100 %] 99 % (11/30 1900) I/O last 3 completed shifts: In: 3461 [P.O.:960; I.V.:2500; IV Piggyback:1] Out: 4270 [Urine:4270]  Physical Examination: General: awake, no distress Neuro: Alert o x 3 HEENT:jvd wnl PULM: no crackles CV: s1 s2 RRR not tachy GI: soft bs wnl slight hypo, hungry Extremities: no edema  Labs and Imaging:   Reviewed.  Please refer to the Assessment and Plan section for relevant results.  Assessment and Plan: Upper GI bleed w/ acute blood loss anemia in the setting of gastric ulcer exacerbated by NSAIDs. Now s/p epi injection on 11/30. Hgb stabilized.  Lab 06/13/11 0305 06/13/11 0043 06/12/11 1800  HGB 8.6* 8.8* 8.9*  plan: -PPI infusion to consider continue -clear liquids tolerated -watch in ICU over night, GI to decide on needs in ICU, is likley high risk rebleed -no more NSAIDs Cbc to q12h   Syncope and collapse: 2/2 above (resolved)   Leukocytosis  Lab 06/13/11 0305 06/12/11 0509 06/11/11 1705  WBC 7.3 9.7 12.1*  plan: -f/u wbc in am, demarg  Polyuria Iatrogenic assess urine Na, reduce fluids  hypoK Replace recheck  Best practices / Disposition: -->ICU status under PCCM -->full code -->PAS for DVT Px -->Protonix gtt -->diet: clears -->family updated at bedside  The patient is critically ill with multiple organ systems failure and requires high complexity decision making for assessment and support, frequent evaluation and titration of therapies, application of advanced monitoring technologies and extensive interpretation of multiple databases. Critical Care Time devoted to patient care services described in this note is   minutes.  Mcarthur Rossetti. Tyson Alias, MD, FACP Pgr: [phone] [place] [facility]

## 2011-06-14 DIAGNOSIS — D62 Acute posthemorrhagic anemia: Secondary | ICD-10-CM

## 2011-06-14 DIAGNOSIS — R578 Other shock: Secondary | ICD-10-CM

## 2011-06-14 DIAGNOSIS — K921 Melena: Secondary | ICD-10-CM

## 2011-06-14 DIAGNOSIS — K3182 Dieulafoy lesion (hemorrhagic) of stomach and duodenum: Secondary | ICD-10-CM

## 2011-06-14 LAB — HEMOGLOBIN AND HEMATOCRIT, BLOOD
HCT: 26.5 % — ABNORMAL LOW (ref 39.0–52.0)
Hemoglobin: 9.2 g/dL — ABNORMAL LOW (ref 13.0–17.0)

## 2011-06-14 LAB — BASIC METABOLIC PANEL
Calcium: 8.3 mg/dL — ABNORMAL LOW (ref 8.4–10.5)
Creatinine, Ser: 0.84 mg/dL (ref 0.50–1.35)
GFR calc Af Amer: >90 mL/min (ref >90)
GFR calc non Af Amer: 81 mL/min — ABNORMAL LOW (ref >90)
Sodium: 138 mEq/L (ref 135–145)

## 2011-06-14 LAB — PHOSPHORUS: Phosphorus: 4 mg/dL (ref 2.3–4.6)

## 2011-06-14 LAB — MAGNESIUM: Magnesium: 2.1 mg/dL (ref 1.5–2.5)

## 2011-06-14 LAB — CBC
Platelets: 145 10*3/uL — ABNORMAL LOW (ref 150–400)
RBC: 2.72 MIL/uL — ABNORMAL LOW (ref 4.22–5.81)
RDW: 15.3 % (ref 11.5–15.5)
WBC: 6.3 10*3/uL (ref 4.0–10.5)

## 2011-06-14 MED ORDER — PANTOPRAZOLE SODIUM 40 MG PO TBEC
40.0000 mg | DELAYED_RELEASE_TABLET | Freq: Two times a day (BID) | ORAL | Status: DC
  Administered 2011-06-14 – 2011-06-15 (×2): 40 mg via ORAL
  Filled 2011-06-14 (×3): qty 1

## 2011-06-14 NOTE — Progress Notes (Signed)
Patient ID: Lucas Shepherd, male   DOB: Jan 29, 1929, 77 y.o.   MRN: [id number] Please see daily note, currently patient medically stable for a telemetry floor bed

## 2011-06-14 NOTE — Progress Notes (Signed)
Patient ID: Lucas Shepherd, male   DOB: August 20, 1928, 77 y.o.   MRN: [id number] [facility] Progress Note  Subjective: Lucas Shepherd is doing well. He has no complaints of abdominal pain or nausea and generally feels much better. He still has not had a bowel movement. He is eating full liquids without difficulty. Hemoglobin is 9.2 this morning which is on the rise.  Objective:  Vital signs in last 24 hours: Temp:  [97.6 F (36.4 C)-98.4 F (36.9 C)] 97.6 F (36.4 C) (12/02 0800) Pulse Rate:  [64-79] 67  (12/02 0800) Resp:  [14-21] 14  (12/02 0800) BP: (99-125)/(44-74) 99/44 mmHg (12/02 0800) SpO2:  [98 %-100 %] 99 % (12/02 0800)   General:   Alert,  Well-developed, elderly white male in NAD Heart:  Regular rate and rhythm; no murmurs Abdomen:  Soft, nontender and nondistended. Normal bowel sounds, without guarding,r Extremities:  Without edema. Neurologic:  Alert and  oriented x4;  grossly normal neurologically. Psych:  Alert and cooperative. Normal mood and affect.  Intake/Output from previous day: 12/01 0701 - 12/02 0700 In: 2605 [P.O.:1080; I.V.:1525] Out: 3500 [Urine:3500] Intake/Output this shift:    Lab Results:  Basename 06/14/11 0755 06/14/11 0345 06/13/11 1830 06/13/11 0305 06/12/11 0509  WBC -- 6.3 -- 7.3 9.7  HGB 9.2* 8.4* 8.9* -- --  HCT 26.5* 24.5* 26.2* -- --  PLT -- 145* -- 138* 129*   BMET  Basename 06/14/11 0345 06/13/11 0305 06/12/11 0509  NA 138 136 138  K 3.8 3.3* 3.5  CL 108 108 110  CO2 25 23 25   GLUCOSE 93 99 92  BUN 11 16 34*  CREATININE 0.84 0.71 0.78  CALCIUM 8.3* 7.9* 7.9*         Studies/Results: Dg Shoulder Right Port  06/13/2011  *RADIOLOGY REPORT*  Clinical Data: The right shoulder pain.  Fall at home.  PORTABLE RIGHT SHOULDER - 2+ VIEW  Comparison: None.  Findings: The humeral head is located.  No evidence of subluxation or dislocation.  Acromioclavicular joint is aligned and characterized by degenerative  change.  No acute bony abnormality is identified.  The visualized right ribs are unremarkable.  IMPRESSION:  1.  No acute bony abnormality of the right shoulder. 2.  Degenerative change of the acromioclavicular joint.  Original Report Authenticated By: Britta Mccreedy, M.D.     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 He has been very stable and has not had any further evidence of active bleeding. He is status post injection therapy and Endo Clip on 06/12/2011. #2 anemia acute secondary to blood loss, stable #3 syncope secondary to above  PLAN Patient has orders to transfer to a regular bed,  Continue full liquid diet today Continue Protonix infusion today and plan to convert to oral in a.m.  Begin ambulating, consider discharge home on 12/ 3. Active Problems:  GERD  Syncope and collapse  Melena  Anemia  Azotemia  Volume depletion  Leukocytosis     LOS: 3 days   Amy Esterwood  06/14/2011, 9:03 AM  AGREE. JNP

## 2011-06-14 NOTE — Progress Notes (Signed)
Subjective: The patient alert oriented x3, no abdominal pain no nausea no vomiting. He still has not had a bowel movement. He is tolerating clear liquids, GI and critical care note has been reviewed. Currently there are plans to advance his diet and transfer to floor. We'll also saline lock IV fluids  Objective: Vital signs in last 24 hours: Temp:  [97.6 F (36.4 C)-98.4 F (36.9 C)] 98.3 F (36.8 C) (12/02 1200) Pulse Rate:  [60-77] 60  (12/02 1200) Resp:  [10-20] 10  (12/02 1200) BP: (99-125)/(44-56) 115/50 mmHg (12/02 1200) SpO2:  [98 %-100 %] 99 % (12/02 1200) Weight change:     Intake/Output from previous day: 12/01 0701 - 12/02 0700 In: 2605 [P.O.:1080; I.V.:1525] Out: 3500 [Urine:3500] Intake/Output this shift:    General appearance: alert and cooperative Resp: clear to auscultation bilaterally GI: soft, non-tender; bowel sounds normal; no masses,  no organomegaly  Lab Results:  [facility] 06/14/11 0755 06/14/11 0345 06/13/11 0305  WBC -- 6.3 7.3  HGB 9.2* 8.4* --  HCT 26.5* 24.5* --  PLT -- 145* 138*   BMET  Basename 06/14/11 0345 06/13/11 0305  NA 138 136  K 3.8 3.3*  CL 108 108  CO2 25 23  GLUCOSE 93 99  BUN 11 16  CREATININE 0.84 0.71  CALCIUM 8.3* 7.9*    Studies/Results: Dg Shoulder Right Port  06/13/2011  *RADIOLOGY REPORT*  Clinical Data: The right shoulder pain.  Fall at home.  PORTABLE RIGHT SHOULDER - 2+ VIEW  Comparison: None.  Findings: The humeral head is located.  No evidence of subluxation or dislocation.  Acromioclavicular joint is aligned and characterized by degenerative change.  No acute bony abnormality is identified.  The visualized right ribs are unremarkable.  IMPRESSION:  1.  No acute bony abnormality of the right shoulder. 2.  Degenerative change of the acromioclavicular joint.  Original Report Authenticated By: Britta Mccreedy, M.D.    Medications:  Prior to Admission:  Prescriptions prior to admission  Medication Sig Dispense  Refill  . aspirin 325 MG EC tablet Take 325 mg by mouth every other day.       . fish oil-omega-3 fatty acids 1000 MG capsule Take 2 g by mouth daily.        Marland Kitchen glucosamine-chondroitin 500-400 MG tablet Take 1 tablet by mouth 2 (two) times daily between meals.       . Grape Seed Extract 100 MG CAPS Take 1 capsule by mouth daily.        . mesalamine (PENTASA) 500 MG CR capsule Take 1,000 mg by mouth 3 (three) times daily.        . multivitamin-lutein (OCUVITE-LUTEIN) CAPS Take 1 capsule by mouth daily.       Marland Kitchen OVER THE COUNTER MEDICATION Take 1 capsule by mouth daily. Beta Sitosterol capsule       . OVER THE COUNTER MEDICATION Take 1 capsule by mouth daily. BLADDERWRACK.       . Tamsulosin HCl (FLOMAX) 0.4 MG CAPS Take 0.4 mg by mouth Nightly.        Scheduled:   Marland Kitchen Tamsulosin HCl  0.4 mg Oral QHS   Continuous:   . sodium chloride 50 mL/hr at 06/14/11 1333  . pantoprozole (PROTONIX) infusion 8 mg/hr (06/14/11 1331)   ZOX:[id number], ondansetron, oxyCODONE-acetaminophen  Assessment/Plan: Syncope secondary to GI bleed, status post 3 units of packed red blood cells, current hemoglobin stable, no active signs of bleeding. Recommend advance diet, saline lock IV fluids, further recommendations as they  necessary.  LOS: 3 days   Jenascia Bumpass D 06/14/2011, 1:53 PM

## 2011-06-15 DIAGNOSIS — K920 Hematemesis: Secondary | ICD-10-CM

## 2011-06-15 DIAGNOSIS — K3182 Dieulafoy lesion (hemorrhagic) of stomach and duodenum: Secondary | ICD-10-CM

## 2011-06-15 LAB — TYPE AND SCREEN
ABO/RH(D): O POS
Unit division: 0
Unit division: 0

## 2011-06-15 LAB — HEMOGLOBIN AND HEMATOCRIT, BLOOD: Hemoglobin: 9.7 g/dL — ABNORMAL LOW (ref 13.0–17.0)

## 2011-06-15 MED ORDER — PANTOPRAZOLE SODIUM 40 MG PO TBEC: 40.0000 mg | DELAYED_RELEASE_TABLET | Freq: Two times a day (BID) | ORAL | Status: DC

## 2011-06-15 NOTE — Progress Notes (Signed)
Patient seen and I agree with the above documentation, including the assessment and plan. No further bleeding after injection and endo-clip to gastric Dieulafoy lesion. Followup to be arranged in our office Cont BID po PPI through f/u with [place].

## 2011-06-15 NOTE — Progress Notes (Signed)
Patient appears stable and is on full liquid diet. Patient thinks he is to stay on this for a few days and then advance. He can do this at home.   This morning's Hb is pending.   I think he is ready for discharge. If GI agrees, would appreciate call to me at [phone] (cell) so that I can do d/c paperwork.

## 2011-06-15 NOTE — Progress Notes (Signed)
Patient ID: Lucas Shepherd, male   DOB: [birth year], 77 y.o.   MRN: [id number] [facility] Progress Note  Subjective: Lucas Shepherd is doing well. He says his energy level is good he is been eating without difficulty has no complaints of lightheadedness dizziness etc. he still has not had a bowel movement. He feels he is ready to go home  Objective:  Vital signs in last 24 hours: Temp:  [97.6 F (36.4 C)-98.3 F (36.8 C)] 98.2 F (36.8 C) (12/03 0621) Pulse Rate:  [60-77] 63  (12/03 0621) Resp:  [10-18] 18  (12/03 0621) BP: (109-152)/(50-75) 109/65 mmHg (12/03 0621) SpO2:  [98 %-100 %] 98 % (12/03 0621) Last BM Date: 06/11/11 General:   Alert,  Well-developed, elderly white male in NAD Heart:  Regular rate and rhythm; no murmurs Abdomen:  Soft, nontender and nondistended. Normal bowel sounds, without guarding, Extremities:  Without edema. Neurologic:  Alert and  oriented x4;  grossly normal neurologically. Psych:  Alert and cooperative. Normal mood and affect.  Intake/Output from previous day: 12/02 0701 - 12/03 0700 In: 360 [P.O.:360] Out: 2325 [Urine:2325] Intake/Output this shift: Total I/O In: 240 [P.O.:240] Out: -   Lab Results:  Basename 06/15/11 0800 06/14/11 2004 06/14/11 0755 06/14/11 0345 06/13/11 0305  WBC -- -- -- 6.3 7.3  HGB 9.7* 8.8* 9.2* -- --  HCT 27.5* 24.9* 26.5* -- --  PLT -- -- -- 145* 138*   BMET  Basename 06/14/11 0345 06/13/11 0305  NA 138 136  K 3.8 3.3*  CL 108 108  CO2 25 23  GLUCOSE 93 99  BUN 11 16  CREATININE 0.84 0.71  CALCIUM 8.3* 7.9*          Studies/Results: Dg Shoulder Right Port  06/13/2011  *RADIOLOGY REPORT*  Clinical Data: The right shoulder pain.  Fall at home.  PORTABLE RIGHT SHOULDER - 2+ VIEW  Comparison: None.  Findings: The humeral head is located.  No evidence of subluxation or dislocation.  Acromioclavicular joint is aligned and characterized by degenerative change.  No acute bony abnormality  is identified.  The visualized right ribs are unremarkable.  IMPRESSION:  1.  No acute bony abnormality of the right shoulder. 2.  Degenerative change of the acromioclavicular joint.  Original Report Authenticated By: Britta Mccreedy, M.D.     Assessment / Plan: #29 77 year old male with acute major GI bleed secondary to do a Foley lesion in the gastric cardia. Patient has been stable for 72 hours status post injection therapy and Endo Clipping. #2 anemia acute secondary to GI blood loss is stable and hemoglobin on the rise  Patient is stable for discharge home today. He will need to remain on twice daily protonic over the next 4-6 weeks and then may keep him on once daily thereafter. I will make him a followup appointment with Dr. Sheryn Bison in the office in 2 weeks and we'll plan to recheck his hemoglobin at that time. Patient needs to remain off of aspirin and NSAIDs Active Problems:  GERD  Syncope and collapse  Melena  Anemia  Azotemia  Volume depletion  Leukocytosis     LOS: 4 days   Lucas Shepherd  06/15/2011, 11:10 AM

## 2011-06-15 NOTE — Discharge Summary (Signed)
  Physician Discharge Summary  NAME:Lucas Shepherd  ZOX:[id number]  DOB: 1928-10-31   Admit date: 06/11/2011 Discharge date: 06/15/2011  Admitting Diagnosis: Syncope   Discharge Diagnoses:  UGI bleed secondary to Dieulafoy lesion, s/p endoscopic therapy and resolution of bleeding.  Active Problems:  Syncope and collapse due to acute blood loss anemia  Anemia, acute blood loss  Discharge Condition: improved  Hospital Course: Patient initially admitted to ICU due to severity of blood loss and hypotension. Received 3U PRBCs and underwent EGD that showed findings listed above. He did well post procedure and was observed for three days to be sure that he has no further problems with bleeding. Placed on bid Protonix for now. ASA and all NSAIDs discontinued.   He recently had shoulder surgery by Dr. Teressa Senter. There were no issues relating to his recent shoulder surgery.   Consults: [facility]  Disposition: Home or Self Care  Discharge Orders    Future Appointments: Provider: Department: Dept Phone: Center:   06/26/2011 10:30 AM Sheryn Bison, MD [facility] Office [phone] [facility]     Future Orders Please Complete By Expires   Diet - low sodium heart healthy      Comments:   Eat a soft diet for the first few days and then back to your normal diet   Increase activity slowly      Increase activity slowly        Current Discharge Medication List    START taking these medications   Details  pantoprazole (PROTONIX) 40 MG tablet Take 1 tablet (40 mg total) by mouth 2 (two) times daily before a meal. Qty: 60 tablet, Refills: 12      CONTINUE these medications which have NOT CHANGED   Details  fish oil-omega-3 fatty acids 1000 MG capsule Take 2 g by mouth daily.      glucosamine-chondroitin 500-400 MG tablet Take 1 tablet by mouth 2 (two) times daily between meals.     Grape Seed Extract 100 MG CAPS Take 1 capsule by mouth daily.      mesalamine (PENTASA) 500 MG CR  capsule Take 1,000 mg by mouth 3 (three) times daily.      multivitamin-lutein (OCUVITE-LUTEIN) CAPS Take 1 capsule by mouth daily.     !! OVER THE COUNTER MEDICATION Take 1 capsule by mouth daily. Beta Sitosterol capsule     !! OVER THE COUNTER MEDICATION Take 1 capsule by mouth daily. BLADDERWRACK.     Tamsulosin HCl (FLOMAX) 0.4 MG CAPS Take 0.4 mg by mouth Nightly.         STOP taking these medications     aspirin 325 MG EC tablet        Follow-up Information    Follow up with Darnelle Bos, MD in 1 week. (Please come to the [facility] third floor lab in Dr. Newell Coral building between [place] and 4 PM on 06/22/11 to have your blood count rechecked.  )          Things to follow up in the outpatient setting: CBC will be done in Dr. Newell Coral office in one week.   Time coordinating discharge: 35 minutes including visit in AM, discussion with GI PA, and preparation of medical records.   SignedDarnelle Bos 06/15/2011, 1:18 PM

## 2011-06-17 ENCOUNTER — Encounter (HOSPITAL_COMMUNITY): Payer: Self-pay | Admitting: Internal Medicine

## 2011-06-23 ENCOUNTER — Telehealth: Payer: Self-pay | Admitting: Gastroenterology

## 2011-06-23 DIAGNOSIS — K922 Gastrointestinal hemorrhage, unspecified: Secondary | ICD-10-CM

## 2011-06-23 NOTE — Telephone Encounter (Signed)
Notified pt if his edema continues, he needs to f/u with his PCP Dr Earl Gala. Scheduled his repeat EGD for 07/24/2011; mailed his prep instructions to him. Pt stated understanding.

## 2011-06-23 NOTE — Telephone Encounter (Signed)
Dr.Osborne i care problem

## 2011-06-23 NOTE — Telephone Encounter (Signed)
Pt with hx of Nissen Fundo. 05/08/2009 by Dr Wenda Low. Last OV with Dr Jarold Motto 08/02/2009; hx of dysphagia, B12 deficiency and BPH. Pt had EGD to control bleeding with EPI injection followed by endo clipping by Dr Marina Goodell on 06/12/11. Pt reports his feet are swollen and puffy each day since his procedure. He does report he hasn't been as active and his activity level is as tolerated but stay off stairs. Pt states he received 3 units of blood while hospitalized with lasix in between. He states he's never had a problem with fluid before. Dr Jarold Motto, don't you think the fluid is d/t inactivity? Also, ok to schedule the f/u EGD for 07/24/11 at 3pm? Please advise.

## 2011-06-26 ENCOUNTER — Encounter (HOSPITAL_BASED_OUTPATIENT_CLINIC_OR_DEPARTMENT_OTHER): Payer: Self-pay

## 2011-06-26 ENCOUNTER — Encounter: Payer: Self-pay | Admitting: Gastroenterology

## 2011-06-26 ENCOUNTER — Ambulatory Visit (INDEPENDENT_AMBULATORY_CARE_PROVIDER_SITE_OTHER): Payer: Medicare Other | Admitting: Gastroenterology

## 2011-06-26 VITALS — BP 108/56 | HR 72 | Ht 72.0 in | Wt 216.0 lb

## 2011-06-26 DIAGNOSIS — K222 Esophageal obstruction: Secondary | ICD-10-CM

## 2011-06-26 DIAGNOSIS — Z8719 Personal history of other diseases of the digestive system: Secondary | ICD-10-CM

## 2011-06-26 DIAGNOSIS — R609 Edema, unspecified: Secondary | ICD-10-CM

## 2011-06-26 DIAGNOSIS — D62 Acute posthemorrhagic anemia: Secondary | ICD-10-CM

## 2011-06-26 DIAGNOSIS — R079 Chest pain, unspecified: Secondary | ICD-10-CM

## 2011-06-26 DIAGNOSIS — R600 Localized edema: Secondary | ICD-10-CM | POA: Insufficient documentation

## 2011-06-26 MED ORDER — SUCRALFATE 1 GM/10ML PO SUSP: 1.0000 g | Freq: Three times a day (TID) | ORAL | Status: DC | PRN

## 2011-06-26 MED ORDER — HEMAX 150-1 MG PO TABS: 1.0000 | ORAL_TABLET | Freq: Every day | ORAL | Status: DC

## 2011-06-26 NOTE — Progress Notes (Addendum)
This is a 77 year old Caucasian male who was recently hospitalized with an acute upper GI bleed, melena, no hematemesis. Endoscopy revealed a Dieulfoy lesion in the fundus of the stomach that was treated by endoscopic clipping. Patient currently asymptomatic. He has had previous fundoplication. Pertinent history includes recent shoulder surgery with the institution of Advil several days before his GI bleed. He denies chronic acid reflux but does have some burning substernal chest pain with exertion. Followup hemoglobin recently was 10.0. He has no history of known cardiovascular or pulmonary problems and is followed by Dr. Benjaman Kindler. He is up-to-date on his colonoscopy exams.   Current Medications, Allergies, Past Medical History, Past Surgical History, Family History and Social History were reviewed in [employer] record.  Pertinent Review of Systems Negative... he has had peripheral edema in his lower extremities since his hospitalization. He denies history of claudications or known coronary artery disease, hypertension, or family history of cardiovascular problems.   Physical Exam: He appears healthy in no acute distress. I cannot appreciate stigmata of chronic liver disease. Chest is clear he is in a regular rhythm without murmurs gallops or rubs. There is no organomegaly, abdominal masses or tenderness. Bowel sounds are normal. Mental status is normal.  Extremities do show trace to +1 pitting edema, but no evidence of phlebitis or arthritis.   Assessment and Plan: Acute upper GI bleed from a Dieulfoy lesion that was clipped endoscopically. We will do followup ENDOScopy at 8 weeks' time. I've suggested that he in the future avoid NSAID therapy. He may need further cardiac evaluation for his substernal chest pain and peripheral edema. I've asked him to followup with Dr. Earl Gala as planned. I did place him on oral current replacement therapy. Otherwise his continues  medications as listed and reviewed. Encounter Diagnosis  Name Primary?  . Esophageal stricture Yes

## 2011-06-26 NOTE — Patient Instructions (Signed)
Your procedure has been scheduled for 07/24/2011. please follow the seperate instructions.  Take Hemax one a day until your samples are gone.  Take carafate as needed.

## 2011-07-14 HISTORY — PX: HERNIA REPAIR: SHX51

## 2011-07-24 ENCOUNTER — Ambulatory Visit (AMBULATORY_SURGERY_CENTER): Payer: Medicare Other | Admitting: Gastroenterology

## 2011-07-24 ENCOUNTER — Encounter: Payer: Self-pay | Admitting: Gastroenterology

## 2011-07-24 DIAGNOSIS — Z9889 Other specified postprocedural states: Secondary | ICD-10-CM | POA: Insufficient documentation

## 2011-07-24 DIAGNOSIS — Z8711 Personal history of peptic ulcer disease: Secondary | ICD-10-CM | POA: Insufficient documentation

## 2011-07-24 DIAGNOSIS — K222 Esophageal obstruction: Secondary | ICD-10-CM

## 2011-07-24 DIAGNOSIS — Z8719 Personal history of other diseases of the digestive system: Secondary | ICD-10-CM

## 2011-07-24 MED ORDER — SODIUM CHLORIDE 0.9 % IV SOLN: 500.0000 mL | INTRAVENOUS | Status: DC

## 2011-07-24 NOTE — Progress Notes (Signed)
Patient did not experience any of the following events: a burn prior to discharge; a fall within the facility; wrong site/side/patient/procedure/implant event; or a hospital transfer or hospital admission upon discharge from the facility. (G8907) Patient did not have preoperative order for IV antibiotic SSI prophylaxis. (G8918)  

## 2011-07-24 NOTE — Op Note (Signed)
[facility] [address] [place], [place]  [address]  ENDOSCOPY PROCEDURE REPORT  PATIENT:  Lucas, Shepherd  MR#:  [id number] BIRTHDATE:  [birth year], 82 yrs. old  GENDER:  male  ENDOSCOPIST:  Vania Rea. Jarold Motto, MD, [facility] Referred by:  PROCEDURE DATE:  07/24/2011 PROCEDURE:  EGD, diagnostic 43235 ASA CLASS:  Class II INDICATIONS:  follow-up of gastric ulcer RECENT UGI BLEED FROM DIEULOFOY'S ULCER,,CLIPPED BY DR. PERRY.PRIOR FUNDOPLICATION PER DR. MARTIN.  MEDICATIONS:   propofol (Diprivan) 150 mg IV TOPICAL ANESTHETIC:  DESCRIPTION OF PROCEDURE:   After the risks and benefits of the procedure were explained, informed consent was obtained.  The [facility] GIF-H180 [id number] endoscope was introduced through the mouth and advanced to the second portion of the duodenum.  The instrument was slowly withdrawn as the mucosa was fully examined. <<PROCEDUREIMAGES>>  s/p fundoplication. METALLIC CLIP IN CARDIA,NO ULCER OR BLEEDING.SEE PICTURE.    Retroflexed views revealed prior fundoplication.    The scope was then withdrawn from the patient and the procedure completed.  COMPLICATIONS:  None  ENDOSCOPIC IMPRESSION: 1) S/p fundoplication NO BLEEDING,GASTRITIS,ESOPHAGITIS,EROSIONS NOTED. RECOMMENDATIONS: CONTINUE PPI FOR 6 WEEKS TO BE SAFE.AVOID NSAID'S.  ______________________________ Vania Rea. Jarold Motto, MD, Clementeen Graham  CC:  Yancey Flemings, MD, Theressa Millard, MD, Luretha Murphy, MD  n. eSIGNED:   Vania Rea. Malai Lady at 07/24/2011 03:44 PM  Glori Luis, [phone]

## 2011-07-24 NOTE — Patient Instructions (Signed)
Discharge instructions given with verbal understanding. Resume previous medications.  

## 2011-07-27 ENCOUNTER — Telehealth: Payer: Self-pay | Admitting: *Deleted

## 2011-07-27 NOTE — Telephone Encounter (Signed)

## 2011-12-22 ENCOUNTER — Encounter (HOSPITAL_BASED_OUTPATIENT_CLINIC_OR_DEPARTMENT_OTHER): Payer: Self-pay | Admitting: *Deleted

## 2011-12-22 ENCOUNTER — Other Ambulatory Visit: Payer: Self-pay | Admitting: Orthopedic Surgery

## 2011-12-22 NOTE — Progress Notes (Signed)
Pt here 12/12 for shoulder-did well To stay [facility] if nec-to bring all med and overnight bag

## 2011-12-24 ENCOUNTER — Encounter (HOSPITAL_BASED_OUTPATIENT_CLINIC_OR_DEPARTMENT_OTHER): Payer: Self-pay | Admitting: *Deleted

## 2011-12-24 ENCOUNTER — Ambulatory Visit (HOSPITAL_BASED_OUTPATIENT_CLINIC_OR_DEPARTMENT_OTHER): Payer: Medicare Other | Admitting: *Deleted

## 2011-12-24 ENCOUNTER — Encounter (HOSPITAL_BASED_OUTPATIENT_CLINIC_OR_DEPARTMENT_OTHER)
Admission: RE | Disposition: A | Discharge status: Home / Self Care | Payer: Self-pay | Source: Ambulatory Visit | Attending: Orthopedic Surgery

## 2011-12-24 ENCOUNTER — Encounter (HOSPITAL_BASED_OUTPATIENT_CLINIC_OR_DEPARTMENT_OTHER): Payer: Self-pay

## 2011-12-24 ENCOUNTER — Ambulatory Visit (HOSPITAL_BASED_OUTPATIENT_CLINIC_OR_DEPARTMENT_OTHER)
Admission: RE | Admit: 2011-12-24 | Discharge: 2011-12-25 | Disposition: A | Discharge status: Home / Self Care | Payer: Medicare Other | Source: Ambulatory Visit | Attending: Orthopedic Surgery | Admitting: Orthopedic Surgery

## 2011-12-24 DIAGNOSIS — W19XXXS Unspecified fall, sequela: Secondary | ICD-10-CM | POA: Insufficient documentation

## 2011-12-24 DIAGNOSIS — S42309S Unspecified fracture of shaft of humerus, unspecified arm, sequela: Secondary | ICD-10-CM | POA: Insufficient documentation

## 2011-12-24 DIAGNOSIS — IMO0002 Reserved for concepts with insufficient information to code with codable children: Secondary | ICD-10-CM | POA: Insufficient documentation

## 2011-12-24 DIAGNOSIS — Y9239 Other specified sports and athletic area as the place of occurrence of the external cause: Secondary | ICD-10-CM | POA: Insufficient documentation

## 2011-12-24 DIAGNOSIS — K219 Gastro-esophageal reflux disease without esophagitis: Secondary | ICD-10-CM | POA: Insufficient documentation

## 2011-12-24 HISTORY — DX: Gastrointestinal hemorrhage, unspecified: K92.2

## 2011-12-24 HISTORY — PX: ORIF HUMERUS FRACTURE: SHX2126

## 2011-12-24 SURGERY — OPEN REDUCTION INTERNAL FIXATION (ORIF) PROXIMAL HUMERUS FRACTURE
Anesthesia: General | Site: Shoulder | Laterality: Left | Wound class: Clean

## 2011-12-24 MED ORDER — FENTANYL CITRATE 0.05 MG/ML IJ SOLN: 25.0000 ug | INTRAMUSCULAR | Status: DC | PRN

## 2011-12-24 MED ORDER — ONDANSETRON HCL 4 MG/2ML IJ SOLN: 4.0000 mg | Freq: Four times a day (QID) | INTRAMUSCULAR | Status: DC | PRN

## 2011-12-24 MED ORDER — METOCLOPRAMIDE HCL 5 MG/ML IJ SOLN: 10.0000 mg | Freq: Once | INTRAMUSCULAR | Status: AC | PRN

## 2011-12-24 MED ORDER — SUCCINYLCHOLINE CHLORIDE 20 MG/ML IJ SOLN
INTRAMUSCULAR | Status: DC | PRN
  Administered 2011-12-24: 140 mg via INTRAVENOUS

## 2011-12-24 MED ORDER — ONDANSETRON HCL 4 MG/2ML IJ SOLN
INTRAMUSCULAR | Status: DC | PRN
  Administered 2011-12-24: 4 mg via INTRAVENOUS

## 2011-12-24 MED ORDER — LACTATED RINGERS IV SOLN
INTRAVENOUS | Status: DC
  Administered 2011-12-24 (×3): via INTRAVENOUS

## 2011-12-24 MED ORDER — ROPIVACAINE HCL 5 MG/ML IJ SOLN
INTRAMUSCULAR | Status: DC | PRN
  Administered 2011-12-24: 15 mL via EPIDURAL

## 2011-12-24 MED ORDER — LIDOCAINE HCL 1 % IJ SOLN
INTRAMUSCULAR | Status: DC | PRN
  Administered 2011-12-24: 2 mL via INTRADERMAL

## 2011-12-24 MED ORDER — CHLORHEXIDINE GLUCONATE 4 % EX LIQD: 60.0000 mL | Freq: Once | CUTANEOUS | Status: DC

## 2011-12-24 MED ORDER — LIDOCAINE HCL (CARDIAC) 20 MG/ML IV SOLN
INTRAVENOUS | Status: DC | PRN
  Administered 2011-12-24: 40 mg via INTRAVENOUS

## 2011-12-24 MED ORDER — METOCLOPRAMIDE HCL 5 MG/ML IJ SOLN
INTRAMUSCULAR | Status: DC | PRN
  Administered 2011-12-24: 10 mg via INTRAVENOUS

## 2011-12-24 MED ORDER — CEFAZOLIN SODIUM-DEXTROSE 2-3 GM-% IV SOLR
2.0000 g | Freq: Four times a day (QID) | INTRAVENOUS | Status: DC
  Administered 2011-12-24 – 2011-12-25 (×2): 2 g via INTRAVENOUS

## 2011-12-24 MED ORDER — METHOCARBAMOL 100 MG/ML IJ SOLN: 500.0000 mg | Freq: Four times a day (QID) | INTRAVENOUS | Status: DC | PRN

## 2011-12-24 MED ORDER — LIDOCAINE HCL 4 % MT SOLN
OROMUCOSAL | Status: DC | PRN
  Administered 2011-12-24: 2 mL via TOPICAL

## 2011-12-24 MED ORDER — OXYCODONE HCL 5 MG PO TABS
5.0000 mg | ORAL_TABLET | ORAL | Status: DC | PRN
  Administered 2011-12-24 (×2): 5 mg via ORAL
  Administered 2011-12-25: 10 mg via ORAL

## 2011-12-24 MED ORDER — EPHEDRINE SULFATE 50 MG/ML IJ SOLN
INTRAMUSCULAR | Status: DC | PRN
  Administered 2011-12-24 (×2): 10 mg via INTRAVENOUS

## 2011-12-24 MED ORDER — ONDANSETRON HCL 4 MG PO TABS: 4.0000 mg | ORAL_TABLET | Freq: Four times a day (QID) | ORAL | Status: DC | PRN

## 2011-12-24 MED ORDER — SODIUM CHLORIDE 0.9 % IV SOLN
INTRAVENOUS | Status: DC
  Administered 2011-12-24: 20 mL/h via INTRAVENOUS

## 2011-12-24 MED ORDER — DEXAMETHASONE SODIUM PHOSPHATE 4 MG/ML IJ SOLN
INTRAMUSCULAR | Status: DC | PRN
  Administered 2011-12-24: 10 mg via INTRAVENOUS

## 2011-12-24 MED ORDER — OXYCODONE HCL 5 MG PO TABS: 5.0000 mg | ORAL_TABLET | Freq: Once | ORAL | Status: AC | PRN

## 2011-12-24 MED ORDER — HYDROMORPHONE HCL PF 1 MG/ML IJ SOLN
0.5000 mg | INTRAMUSCULAR | Status: DC | PRN
  Administered 2011-12-25: 1 mg via INTRAVENOUS

## 2011-12-24 MED ORDER — FENTANYL CITRATE 0.05 MG/ML IJ SOLN
50.0000 ug | INTRAMUSCULAR | Status: DC | PRN
  Administered 2011-12-24: 100 ug via INTRAVENOUS

## 2011-12-24 MED ORDER — PROPOFOL 10 MG/ML IV EMUL
INTRAVENOUS | Status: DC | PRN
  Administered 2011-12-24: 200 mg via INTRAVENOUS

## 2011-12-24 MED ORDER — METHOCARBAMOL 500 MG PO TABS
500.0000 mg | ORAL_TABLET | Freq: Four times a day (QID) | ORAL | Status: DC | PRN
  Administered 2011-12-24: 500 mg via ORAL

## 2011-12-24 MED ORDER — CEFAZOLIN SODIUM 1-5 GM-% IV SOLN
INTRAVENOUS | Status: DC | PRN
  Administered 2011-12-24: 2 g via INTRAVENOUS

## 2011-12-24 MED ORDER — MIDAZOLAM HCL 2 MG/2ML IJ SOLN
0.5000 mg | INTRAMUSCULAR | Status: DC | PRN
  Administered 2011-12-24: 2 mg via INTRAVENOUS

## 2011-12-24 SURGICAL SUPPLY — 77 items
.062 KWIRE SMOOTH - ACUMED ×2 IMPLANT
BANDAGE ADHESIVE 1X3 (GAUZE/BANDAGES/DRESSINGS) IMPLANT
BIT DRILL 2.8X5 QR DISP (BIT) ×2 IMPLANT
BLADE SURG 15 STRL LF DISP TIS (BLADE) ×1 IMPLANT
BLADE SURG 15 STRL SS (BLADE) ×1
CANISTER SUCTION 2500CC (MISCELLANEOUS) IMPLANT
CLEANER CAUTERY TIP 5X5 PAD (MISCELLANEOUS) IMPLANT
CLOTH BEACON ORANGE TIMEOUT ST (SAFETY) ×2 IMPLANT
CORDS BIPOLAR (ELECTRODE) IMPLANT
CUFF TOURNIQUET SINGLE 18IN (TOURNIQUET CUFF) IMPLANT
DECANTER SPIKE VIAL GLASS SM (MISCELLANEOUS) IMPLANT
DRAPE C-ARM 42X72 X-RAY (DRAPES) IMPLANT
DRAPE INCISE IOBAN 66X45 STRL (DRAPES) ×2 IMPLANT
DRAPE OEC MINIVIEW 54X84 (DRAPES) ×2 IMPLANT
DRAPE SURG 17X23 STRL (DRAPES) ×2 IMPLANT
DRAPE U-SHAPE 47X51 STRL (DRAPES) ×2 IMPLANT
DRAPE U-SHAPE 76X120 STRL (DRAPES) ×4 IMPLANT
DRILL CANCELLOUS 2.8MM (BIT) ×2 IMPLANT
DRSG EMULSION OIL 3X3 NADH (GAUZE/BANDAGES/DRESSINGS) ×2 IMPLANT
DRSG PAD ABDOMINAL 8X10 ST (GAUZE/BANDAGES/DRESSINGS) ×2 IMPLANT
DRSG TEGADERM 4X4.75 (GAUZE/BANDAGES/DRESSINGS) ×2 IMPLANT
DURAPREP 26ML APPLICATOR (WOUND CARE) ×2 IMPLANT
ELECT REM PT RETURN 9FT ADLT (ELECTROSURGICAL) ×2
ELECTRODE REM PT RTRN 9FT ADLT (ELECTROSURGICAL) ×1 IMPLANT
GLOVE BIO SURGEON STRL SZ 6.5 (GLOVE) ×2 IMPLANT
GLOVE BIOGEL M STRL SZ7.5 (GLOVE) IMPLANT
GLOVE BIOGEL PI IND STRL 7.0 (GLOVE) ×2 IMPLANT
GLOVE BIOGEL PI IND STRL 8 (GLOVE) ×1 IMPLANT
GLOVE BIOGEL PI INDICATOR 7.0 (GLOVE) ×2
GLOVE BIOGEL PI INDICATOR 8 (GLOVE) ×1
GLOVE ECLIPSE 6.5 STRL STRAW (GLOVE) ×2 IMPLANT
GLOVE ORTHO TXT STRL SZ7.5 (GLOVE) ×6 IMPLANT
GOWN PREVENTION PLUS XLARGE (GOWN DISPOSABLE) ×4 IMPLANT
GOWN PREVENTION PLUS XXLARGE (GOWN DISPOSABLE) ×4 IMPLANT
LOOP VESSEL MAXI BLUE (MISCELLANEOUS) IMPLANT
PACK ARTHROSCOPY DSU (CUSTOM PROCEDURE TRAY) ×2 IMPLANT
PACK BASIN DAY SURGERY FS (CUSTOM PROCEDURE TRAY) ×2 IMPLANT
PAD CLEANER CAUTERY TIP 5X5 (MISCELLANEOUS)
PENCIL BUTTON HOLSTER BLD 10FT (ELECTRODE) ×2 IMPLANT
PLATE PROXIMAL HUMERAL SM LEFT (Plate) ×2 IMPLANT
SCREW CANCELLOUS 5.0X40MM (Screw) ×4 IMPLANT
SCREW CANCELLOUS 5.0X45MM (Screw) ×2 IMPLANT
SCREW CORT 3.5X30 CO (Screw) ×2 IMPLANT
SCREW CORT 3.5X32 (Screw) ×4 IMPLANT
SCREW CORT 3.5X35 CO (Screw) ×2 IMPLANT
SCREW CORT 3.5X40 (Screw) ×2 IMPLANT
SCREW CORT 3.5X50 CO (Screw) ×2 IMPLANT
SLEEVE SCD COMPRESS KNEE MED (MISCELLANEOUS) ×2 IMPLANT
SLING ARM FOAM STRAP LRG (SOFTGOODS) IMPLANT
SLING ARM FOAM STRAP MED (SOFTGOODS) IMPLANT
SLING ULTRA II LARGE (SOFTGOODS) ×2 IMPLANT
SPONGE GAUZE 4X4 12PLY (GAUZE/BANDAGES/DRESSINGS) ×2 IMPLANT
SPONGE LAP 18X18 X RAY DECT (DISPOSABLE) ×2 IMPLANT
SPONGE LAP 4X18 X RAY DECT (DISPOSABLE) ×4 IMPLANT
STRIP CLOSURE SKIN 1/2X4 (GAUZE/BANDAGES/DRESSINGS) ×2 IMPLANT
SUCTION FRAZIER TIP 10 FR DISP (SUCTIONS) IMPLANT
SUT ETHIBOND 2 OS 4 DA (SUTURE) IMPLANT
SUT ETHILON 4 0 PS 2 18 (SUTURE) IMPLANT
SUT FIBERWIRE #2 38 T-5 BLUE (SUTURE)
SUT FIBERWIRE 3-0 18 TAPR NDL (SUTURE)
SUT PROLENE 3 0 PS 2 (SUTURE) ×4 IMPLANT
SUT VIC AB 0 CT1 27 (SUTURE) ×1
SUT VIC AB 0 CT1 27XBRD ANBCTR (SUTURE) ×1 IMPLANT
SUT VIC AB 0 SH 27 (SUTURE) IMPLANT
SUT VIC AB 2-0 SH 27 (SUTURE) ×1
SUT VIC AB 2-0 SH 27XBRD (SUTURE) ×1 IMPLANT
SUT VIC AB 3-0 SH 27 (SUTURE)
SUT VIC AB 3-0 SH 27X BRD (SUTURE) IMPLANT
SUT VIC AB 3-0 X1 27 (SUTURE) IMPLANT
SUTURE FIBERWR #2 38 T-5 BLUE (SUTURE) IMPLANT
SUTURE FIBERWR 3-0 18 TAPR NDL (SUTURE) IMPLANT
SYR 3ML 23GX1 SAFETY (SYRINGE) IMPLANT
SYR BULB 3OZ (MISCELLANEOUS) ×2 IMPLANT
TAPE PAPER 3X10 WHT MICROPORE (GAUZE/BANDAGES/DRESSINGS) ×2 IMPLANT
TOWEL OR 17X24 6PK STRL BLUE (TOWEL DISPOSABLE) ×4 IMPLANT
WATER STERILE IRR 1000ML POUR (IV SOLUTION) ×2 IMPLANT
YANKAUER SUCT BULB TIP NO VENT (SUCTIONS) ×4 IMPLANT

## 2011-12-24 NOTE — Brief Op Note (Signed)
12/24/2011  3:52 PM  PATIENT:  Lucas Shepherd  78 y.o. male  PRE-OPERATIVE DIAGNOSIS:  Three part, comminuted and displaced fracture left proximal humerus   POST-OPERATIVE DIAGNOSIS:  Three part, comminuted displaced fracture left proximal humerus   PROCEDURE:   OPEN REDUCTION INTERNAL FIXATION (ORIF) PROXIMAL HUMERUS FRACTURE (Left) WITH ACUMED PLATE SYSTEM  SURGEON:      * Wyn Forster., MD - Primary    * Tami Ribas, MD - Assisting  PHYSICIAN ASSISTANT:   ASSISTANTS: surgical tech   ANESTHESIA:   general  EBL:  Total I/O In: 2100 [I.V.:2100] Out: -   BLOOD ADMINISTERED:none  DRAINS: none   LOCAL MEDICATIONS USED:  ropivicaine left brachial plexus block  SPECIMEN:  No Specimen  DISPOSITION OF SPECIMEN:  N/A  COUNTS:  YES  TOURNIQUET:  * No tourniquets in log *  DICTATION: .Other Dictation: Dictation Number [id number]  PLAN OF CARE: Admit for overnight observation  PATIENT DISPOSITION:  PACU - hemodynamically stable.

## 2011-12-24 NOTE — Progress Notes (Signed)
Assisted Dr. Frederick with left, ultrasound guided, supraclavicular block. Side rails up, monitors on throughout procedure. See vital signs in flow sheet. Tolerated Procedure well. 

## 2011-12-24 NOTE — Anesthesia Procedure Notes (Addendum)
Anesthesia Regional Block:  Interscalene brachial plexus block  Pre-Anesthetic Checklist: ,, timeout performed, Correct Patient, Correct Site, Correct Laterality, Correct Procedure, Correct Position, site marked, Risks and benefits discussed,  Surgical consent,  Pre-op evaluation,  At surgeon's request and post-op pain management  Laterality: Left  Prep: chloraprep       Needles:   Needle Type: Other   (Arrow Echogenic)   Needle Length: 9cm  Needle Gauge: 21    Additional Needles:  Procedures: ultrasound guided Interscalene brachial plexus block Narrative:  Start time: 12/24/2011 11:45 AM End time: 12/24/2011 11:54 AM Injection made incrementally with aspirations every 5 mL.  Performed by: Personally  Anesthesiologist: Aldona Lento, MD  Additional Notes: Ultrasound guidance used to: id relevant anatomy, confirm needle position, local anesthetic spread, avoidance of vascular puncture. Picture saved. No complications. Block performed personally by Janetta Hora. Gelene Mink, MD    Interscalene brachial plexus block Procedure Name: Intubation Date/Time: 12/24/2011 1:23 PM Performed by: Suann Larry WOLFE Pre-anesthesia Checklist: Patient identified, Emergency Drugs available, Suction available and Patient being monitored Patient Re-evaluated:Patient Re-evaluated prior to inductionOxygen Delivery Method: Circle System Utilized Preoxygenation: Pre-oxygenation with 100% oxygen Intubation Type: IV induction Ventilation: Mask ventilation without difficulty Laryngoscope Size: Miller and 2 Grade View: Grade I Tube type: Oral Number of attempts: 1 Airway Equipment and Method: stylet and LTA kit utilized Placement Confirmation: ETT inserted through vocal cords under direct vision,  positive ETCO2 and breath sounds checked- equal and bilateral Secured at: 23 cm Tube secured with: Tape Dental Injury: Teeth and Oropharynx as per pre-operative assessment

## 2011-12-24 NOTE — Anesthesia Preprocedure Evaluation (Addendum)
Anesthesia Evaluation  Patient identified by MRN, date of birth, ID band Patient awake    Reviewed: Allergy & Precautions, H&P , NPO status , Patient's Chart, lab work & pertinent test results, reviewed documented beta blocker date and time   Airway Mallampati: II TM Distance: >3 FB Neck ROM: full    Dental   Pulmonary neg pulmonary ROS,          Cardiovascular negative cardio ROS      Neuro/Psych  Neuromuscular disease negative psych ROS   GI/Hepatic Neg liver ROS, hiatal hernia, GERD-  ,  Endo/Other  negative endocrine ROS  Renal/GU negative Renal ROS  negative genitourinary   Musculoskeletal   Abdominal   Peds  Hematology negative hematology ROS (+)   Anesthesia Other Findings See surgeon's H&P   Reproductive/Obstetrics negative OB ROS                           Anesthesia Physical Anesthesia Plan  ASA: II  Anesthesia Plan: General   Post-op Pain Management:    Induction: Intravenous  Airway Management Planned: Oral ETT  Additional Equipment:   Intra-op Plan:   Post-operative Plan: Extubation in OR  Informed Consent: I have reviewed the patients History and Physical, chart, labs and discussed the procedure including the risks, benefits and alternatives for the proposed anesthesia with the patient or authorized representative who has indicated his/her understanding and acceptance.   Dental Advisory Given  Plan Discussed with: CRNA and Surgeon  Anesthesia Plan Comments:        Anesthesia Quick Evaluation

## 2011-12-24 NOTE — Anesthesia Postprocedure Evaluation (Signed)
Anesthesia Post Note  Patient: Lucas Shepherd  Procedure(s) Performed: Procedure(s) (LRB): OPEN REDUCTION INTERNAL FIXATION (ORIF) PROXIMAL HUMERUS FRACTURE (Left)  Anesthesia type: general  Patient location: PACU  Post pain: Pain level controlled  Post assessment: Patient's Cardiovascular Status Stable  Last Vitals:  Filed Vitals:   12/24/11 1600  BP: 144/69  Pulse: 76  Temp:   Resp: 18    Post vital signs: Reviewed and stable  Level of consciousness: sedated  Complications: No apparent anesthesia complications

## 2011-12-24 NOTE — Op Note (Signed)
[id number] 

## 2011-12-24 NOTE — Transfer of Care (Signed)
Immediate Anesthesia Transfer of Care Note  Patient: Lucas Shepherd  Procedure(s) Performed: Procedure(s) (LRB): OPEN REDUCTION INTERNAL FIXATION (ORIF) PROXIMAL HUMERUS FRACTURE (Left)  Patient Location: PACU  Anesthesia Type: GA combined with regional for post-op pain  Level of Consciousness: awake, alert  and oriented  Airway & Oxygen Therapy: Patient Spontanous Breathing and Patient connected to face mask oxygen  Post-op Assessment: Report given to PACU RN, Post -op Vital signs reviewed and stable and Patient moving all extremities  Post vital signs: Reviewed and stable  Complications: No apparent anesthesia complications

## 2011-12-24 NOTE — H&P (Signed)
Lucas Shepherd is an 78 y.o. male.   Chief Complaint: pain, swelling and eccymosis of left arm HPI: S/P fall on rough terrain seeking a lost golf ball 10 days prior leading to comminuted surgical neck fracture of right proximal humerus.  Unable to tolerate sling and swathe and fracture shifting due to unopposed pul of pectoralis major.  Marked bleeding into arm prompted pt/ptt which were normal.  Not on ant anti platelet drugs but on multiple suppliments ( advised to hold after fracture).  Past Medical History  Diagnosis Date  . GERD (gastroesophageal reflux disease)     nisseum fundoplication 2010  . Anemia   . BPH (benign prostatic hyperplasia)   . Kidney stones     History of  . History of urinary frequency   . Urinary urgency   . Hiatal hernia   . History of TMJ syndrome     mild  . Arthritis   . Blood transfusion   . GI bleed 12/12    got 3 units blood    Past Surgical History  Procedure Date  . Nissen fundoplication 2010  . Shoulder arthroscopy 1954    rt  . Abdominal surgery 1985    tummy tuc  . Colonoscopy   . Cataract extraction     left  . Esophagogastroduodenoscopy 06/12/2011    Procedure: ESOPHAGOGASTRODUODENOSCOPY (EGD);  Surgeon: Yancey Flemings, MD;  Location: [facility];  Service: Endoscopy;  Laterality: N/A;  . Shoulder arthroscopy 05/28/2011    left    Family History  Problem Relation Age of Onset  . Heart defect Father     aortic valve  . Nephrolithiasis Mother   . Lung cancer Brother     smoker  . Colon cancer Neg Hx   . Esophageal cancer Neg Hx   . Stomach cancer Neg Hx   . Rectal cancer Neg Hx    Social History:  reports that he quit smoking about 45 years ago. His smoking use included Cigarettes. He has a 12 pack-year smoking history. He has never used smokeless tobacco. He reports that he drinks about .6 ounces of alcohol per week. He reports that he does not use illicit drugs.  Allergies: No Known Allergies  Medications Prior to  Admission  Medication Sig Dispense Refill  . fish oil-omega-3 fatty acids 1000 MG capsule Take 2 g by mouth daily.        . Flaxseed, Linseed, (FLAXSEED OIL PO) Take 1 capsule by mouth daily.        . Glucosamine HCl-MSM (MSM GLUCOSAMINE PO) Take 1 tablet by mouth 2 (two) times daily.        Marland Kitchen glucosamine-chondroitin 500-400 MG tablet Take 1 tablet by mouth 2 (two) times daily between meals.       . Grape Seed Extract 100 MG CAPS Take 1 capsule by mouth daily.        . multivitamin-lutein (OCUVITE-LUTEIN) CAPS Take 1 capsule by mouth daily.       Marland Kitchen OVER THE COUNTER MEDICATION Take 1 capsule by mouth daily. Beta Sitosterol capsule      . OVER THE COUNTER MEDICATION Take 1 capsule by mouth daily. BLADDERWRACK.      Marland Kitchen oxyCODONE-acetaminophen (PERCOCET) 7.5-325 MG per tablet Take 1 tablet by mouth every 4 (four) hours as needed.      . pantoprazole (PROTONIX) 40 MG tablet Take 1 tablet (40 mg total) by mouth 2 (two) times daily before a meal.  60 tablet  12  . Tamsulosin  HCl (FLOMAX) 0.4 MG CAPS Take 0.4 mg by mouth Nightly.         No results found for this or any previous visit (from the past 48 hour(s)). No results found.  Review of Systems  Constitutional: Negative.   HENT: Negative.   Eyes: Negative.   Respiratory: Negative.   Gastrointestinal: Positive for constipation.  Musculoskeletal: Positive for falls.  Skin:       marked swelling and eccymosis of entire left arm  Neurological: Negative.   Endo/Heme/Allergies: Bruises/bleeds easily.  Psychiatric/Behavioral: Negative.     Blood pressure 142/65, pulse 58, temperature 98.4 F (36.9 C), temperature source Oral, resp. rate 12, height 6' (1.829 m), weight 84.823 kg (187 lb), SpO2 100.00%. Physical Exam  Constitutional: He is oriented to person, place, and time. He appears well-developed and well-nourished. He appears distressed.  HENT:  Head: Normocephalic and atraumatic.  Eyes: EOM are normal. Pupils are equal, round, and  reactive to light.  Neck: Neck supple.  Cardiovascular: Normal rate, regular rhythm and normal heart sounds.   Respiratory: Breath sounds normal.  GI: Soft. Bowel sounds are normal.  Musculoskeletal: He exhibits edema.  Neurological: He is alert and oriented to person, place, and time.  Skin: Skin is warm.  Psychiatric: He has a normal mood and affect.  There is marked bruising of the entire left arm due to blood loss at the fracture.  Assessment/Plan COMMINUTED FRACTURE OF SURGICAL NECK OF LEFT HUMERUS WITH PAIN AND INSTABILITY PROVOKED BLEEDING. WE HAVE BEEN UNABLE TO CONTROL THIS FRACTURE WITH CLOSED TECHNIQUE. WE PLAN ORIF WITH AN ACUMED PLATE AND LOCKING SCREW CONSTRUCT.  Tamika Shropshire JR,Dana Debo V 12/24/2011, 12:47 PM

## 2011-12-25 LAB — POCT HEMOGLOBIN-HEMACUE
Hemoglobin: 10.1 g/dL — ABNORMAL LOW (ref 13.0–17.0)
Hemoglobin: 11 g/dL — ABNORMAL LOW (ref 13.0–17.0)

## 2011-12-25 NOTE — Addendum Note (Signed)
Addendum  created 12/25/11 0716 by Hart Robinsons, MD   Modules edited:Anesthesia Attestations

## 2011-12-25 NOTE — Op Note (Signed)
NAME:  Lucas Shepherd, Lucas Shepherd NO.:  [id number]  MEDICAL RECORD NO.:  [id number]  LOCATION:                                 FACILITY:  PHYSICIAN:  Katy Fitch. Asser Lucena, M.D.      DATE OF BIRTH:  DATE OF PROCEDURE:  12/24/2011 DATE OF DISCHARGE:                              OPERATIVE REPORT   PREOPERATIVE DIAGNOSES:  Ten days status post comminuted surgical neck fracture of left proximal humerus with a three-part configuration of humeral head fracture and marked instability of shaft on head fragment due to unopposed pull of pectoralis major muscle and latissimus dorsi.  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  OPERATION:  Takedown of malunion, and open reduction and internal fixation of left proximal humeral surgical neck fracture with comminution of the greater tuberosity, with application of a Acumed Polarus titanium plate set.  OPERATING SURGEON:  Katy Fitch. Demarrio Menges, M.D.  ASSISTANT:  Betha Loa, M.D.  ANESTHESIA:  General endotracheal supplemented by a left ropivacaine plexus block.  SUPERVISING ANESTHESIOLOGIST:  Janetta Hora. Gelene Mink, M.D.  INDICATIONS:  Lucas Shepherd is an 78 year old gentleman who is in excellent shape.  He was playing golf on December 13, 2008, at which time he went on to an area of uneven terrain with wet grass trying to retrieve a lost golf ball.  He lost his footing and fell hard onto his outstretched left arm, developing acute pain and swelling in the left shoulder.  Lucas Shepherd thought he dislocated his shoulder.  He called my office as he was in rehab following a left shoulder arthroscopy performed in the winter of 2012.  We saw him on an urgent basis and obtained x-rays of the shoulder which demonstrated a comminuted surgical neck fracture.  At age 70, Lucas Shepherd was eager to avoid surgery and we were equally interested in trying to treat his  fracture with closed technique.  He was treated in a sling and swathe with Ace wrap around his torso in an effort to immobilize the fracture.  We noted there was some medial displacement of the shaft fragment due to the unopposed pull of the pectoralis major and latissimus.  We followed him on every-other-day basis and noted 2 predicaments.  His shaft fragment continued to migrate medially and he had considerable bleeding into the arm.  Lucas Shepherd had a significant GI bleed in the winter of 2013 and was found to have a significant ulceration in his stomach.  He is not on any anti-platelet medications and has never been identified to have a bleeding diathesis.  However, we did note that he had a very significant proclivity to bleed after this fracture.  We checked a hemoglobin and hematocrit 8 days following the fracture, noting that his hemoglobin had dropped to 12 g with a hematocrit of 36.4.  His platelet count was normal.  His prothrombin time and activated partial thromboplastin times were normal.  With his persistent displacement, increasing pain, and bleeding, we decided to proceed with open reduction and internal fixation of his fracture.  He is scheduled electively at this time.  Preoperatively, he was interviewed by Dr. Gelene Mink who  recommended general anesthesia by endotracheal technique and a preoperative ropivacaine plexus block for comfort.  After detailed informed consent, Dr. Gelene Mink placed the ropivacaine block without complication in the holding area leading to excellent anesthesia of the left upper extremity.  PROCEDURE:  Lucas Shepherd was transferred to room 1 of the [facility] and placed in supine position on the operating table. Under Dr. Thornton Dales direct supervision, general endotracheal anesthesia was induced followed by careful position in the beach-chair position with aid of a torso and head holder designed for shoulder arthroscopy.   We positioned Lucas Shepherd so that we could fully visualize the humeral head and shaft of his humerus with the C-arm and fluoroscope.  Left arm above the wrist and the entire left forequarter were prepped with DuraPrep and draped with impervious arthroscopy drapes.  Following routine surgical time-out, we proceeded to perform a 15-cm incision from the [facility] joint to the axillary fold.  Subcutaneous tissues were carefully divided taken care to identify the deltopectoral interval, followed by careful mobilization of the cephalic vein and the anterior head of the deltoid with digital dissection.  A Goulet retractor was placed beneath the deltoid and used to gently retract the deltoid to expose the shaft of the humerus.  Lucas Shepherd had a prior biceps tenotomy, therefore the long head of the biceps was not a reliable landmark.  He did however have abundant callus and a remarkable amount of clotted blood, which obscured the fracture site.  With great care, we developed a plane deep to the deltoid muscle in the bursa and extubated callus with digital dissection and use of rongeur.  We noted that the shaft was impacted into the humeral head and translated more than the center medially. There was some medial healing rendering this a rather complex reduction. With a combination of traction and gentle manipulation, use of a Lion- jaw bone clamp and moderate force, we were able to translate the shaft to a near-anatomic position.  We then cleared the bursa over the greater tuberosity region taken care to protect the deltoid muscle and placed the Polarus plate with a proper drill guide with provisional fixation with a 0.054 Kirschner wire.  Multiple C-arm images confirmed that the plate was in proper position for good screw deployment.  We then placed nonlocking screws into the head securing the head fragment to the plate, and with some difficulty, gradually reduced the shaft and lateralized the  shaft onto the plate with sequential placement of screws beginning distally and working away up the plate.  Ultimately 4 cortical screws were placed reducing the shaft to a near-anatomic position beneath the ahead.  Great care was taken to release less than half of the pectoralis major muscle and an extremely careful subperiosteal dissection was provided medially during placement of a customized Verbrugge clamp to apply the plate.  The cortical screws were carefully measured and placed, beginning distally and moving proximally, followed by multiple C-arm images confirming an excellent position of the shaft versus the head and good screw length.  The wound was meticulously irrigated, followed by hemostasis with the Bovie and use of DeBakey forceps.  The pectoralis major muscle was repaired with periosteum and the plate with 0 Vicryl suture.  The deltopectoral interval was tacked with 2-0 Vicryl sutures, followed by layered closure of the skin with subcutaneous 2-0 Vicryl and intradermal 3-0 Prolene.  At the onset of procedure, Mr. Kozlov was provided 2 g of Ancef as an IV prophylactic antibiotic.  We will  ad[facility] for continued prophylactic IV antibiotics in form of Ancef 2 g IV q.6 hours x3 more doses and appropriate analgesics in the form of p.o. and IV Dilaudid as well as use of acetaminophen.  There were no apparent complications.  Our estimated blood loss was approximately 300 mL with abundant irrigation during surgery.  We will check a fingerstick hemoglobin in the morning to be certain Mr. Ellery is not significantly anemic following his fracture, fracture bleeding, and subsequent surgical blood loss.  He was placed in a shoulder immobilizer, awakened from general anesthesia and transferred to recovery room with stable signs.     Katy Fitch Hideo Googe, M.D.     RVS/MEDQ  D:  12/24/2011  T:  12/24/2011  Job:  [id number]

## 2011-12-25 NOTE — Discharge Instructions (Signed)
HAND SURGERY    HOME CARE INSTRUCTIONS    The following instructions have been prepared to help you care for yourself upon your return home today.  Wound Care:  Keep your hand elevated above the level of your heart. Do not allow it to dangle by your side. Keep the dressing dry and do not remove it unless your doctor advises you to do so. He will usually change it at the time of you post-op visit. Moving your fingers is advised to stimulate circulation but will depend on the site of your surgery. Of course, if you have a splint applied your doctor will advise you about movement.  Activity:  Do not drive or operate machinery today. Rest today and then you may return to your normal activity and work as indicated by your physician.  Diet:  You may resume a regular diet.  General expectations: Pain for two or three days. Fingers may become slightly swollen.   Unexpected Observations- Call your doctor if any of these occur: Severe pain not relieved by pain medication. Elevated temperature. Dressing soaked with blood. Inability to move fingers. White or bluish color to fingers.  Return to office: ***       Regional Anesthesia Blocks  1. Numbness or the inability to move the "blocked" extremity may last from 3-48 hours after placement. The length of time depends on the medication injected and your individual response to the medication. If the numbness is not going away after 48 hours, call your surgeon.  2. The extremity that is blocked will need to be protected until the numbness is gone and the  Strength has returned. Because you cannot feel it, you will need to take extra care to avoid injury. Because it may be weak, you may have difficulty moving it or using it. You may not know what position it is in without looking at it while the block is in effect.  3. For blocks in the legs and feet, returning to weight bearing and walking needs to be done carefully. You will need to  wait until the numbness is entirely gone and the strength has returned. You should be able to move your leg and foot normally before you try and bear weight or walk. You will need someone to be with you when you first try to ensure you do not fall and possibly risk injury.  4. Bruising and tenderness at the needle site are common side effects and will resolve in a few days.  5. Persistent numbness or new problems with movement should be communicated to the surgeon or the [facility] [phone] [facility] [phone]).

## 2011-12-31 ENCOUNTER — Encounter (HOSPITAL_BASED_OUTPATIENT_CLINIC_OR_DEPARTMENT_OTHER): Payer: Self-pay | Admitting: Orthopedic Surgery

## 2012-02-22 ENCOUNTER — Other Ambulatory Visit: Payer: Self-pay | Admitting: Orthopedic Surgery

## 2012-02-22 NOTE — Progress Notes (Signed)
No labs needed-screws coming loose-pain-

## 2012-02-23 ENCOUNTER — Encounter (HOSPITAL_BASED_OUTPATIENT_CLINIC_OR_DEPARTMENT_OTHER): Payer: Self-pay

## 2012-02-23 ENCOUNTER — Ambulatory Visit (HOSPITAL_BASED_OUTPATIENT_CLINIC_OR_DEPARTMENT_OTHER)
Admission: RE | Admit: 2012-02-23 | Discharge: 2012-02-23 | Disposition: A | Discharge status: Home / Self Care | Payer: Medicare Other | Source: Ambulatory Visit | Attending: Orthopedic Surgery | Admitting: Orthopedic Surgery

## 2012-02-23 ENCOUNTER — Encounter (HOSPITAL_BASED_OUTPATIENT_CLINIC_OR_DEPARTMENT_OTHER): Payer: Self-pay | Admitting: Certified Registered Nurse Anesthetist

## 2012-02-23 ENCOUNTER — Encounter (HOSPITAL_BASED_OUTPATIENT_CLINIC_OR_DEPARTMENT_OTHER)
Admission: RE | Disposition: A | Discharge status: Home / Self Care | Payer: Self-pay | Source: Ambulatory Visit | Attending: Orthopedic Surgery

## 2012-02-23 ENCOUNTER — Ambulatory Visit (HOSPITAL_BASED_OUTPATIENT_CLINIC_OR_DEPARTMENT_OTHER): Payer: Medicare Other | Admitting: Certified Registered Nurse Anesthetist

## 2012-02-23 DIAGNOSIS — Z8781 Personal history of (healed) traumatic fracture: Secondary | ICD-10-CM | POA: Insufficient documentation

## 2012-02-23 DIAGNOSIS — T84498A Other mechanical complication of other internal orthopedic devices, implants and grafts, initial encounter: Secondary | ICD-10-CM | POA: Insufficient documentation

## 2012-02-23 DIAGNOSIS — X58XXXA Exposure to other specified factors, initial encounter: Secondary | ICD-10-CM | POA: Insufficient documentation

## 2012-02-23 DIAGNOSIS — Y838 Other surgical procedures as the cause of abnormal reaction of the patient, or of later complication, without mention of misadventure at the time of the procedure: Secondary | ICD-10-CM | POA: Insufficient documentation

## 2012-02-23 DIAGNOSIS — K219 Gastro-esophageal reflux disease without esophagitis: Secondary | ICD-10-CM | POA: Insufficient documentation

## 2012-02-23 HISTORY — PX: HARDWARE REMOVAL: SHX979

## 2012-02-23 SURGERY — REMOVAL, HARDWARE
Anesthesia: General | Site: Shoulder | Laterality: Left | Wound class: Clean

## 2012-02-23 MED ORDER — CHLORHEXIDINE GLUCONATE 4 % EX LIQD: 60.0000 mL | Freq: Once | CUTANEOUS | Status: DC

## 2012-02-23 MED ORDER — FENTANYL CITRATE 0.05 MG/ML IJ SOLN
INTRAMUSCULAR | Status: DC | PRN
  Administered 2012-02-23: 50 ug via INTRAVENOUS

## 2012-02-23 MED ORDER — OXYCODONE HCL 5 MG/5ML PO SOLN: 5.0000 mg | Freq: Once | ORAL | Status: AC | PRN

## 2012-02-23 MED ORDER — METOCLOPRAMIDE HCL 5 MG/ML IJ SOLN: 10.0000 mg | Freq: Once | INTRAMUSCULAR | Status: DC | PRN

## 2012-02-23 MED ORDER — PROPOFOL 10 MG/ML IV EMUL
INTRAVENOUS | Status: DC | PRN
  Administered 2012-02-23: 200 mg via INTRAVENOUS

## 2012-02-23 MED ORDER — DEXAMETHASONE SODIUM PHOSPHATE 4 MG/ML IJ SOLN
INTRAMUSCULAR | Status: DC | PRN
  Administered 2012-02-23: 10 mg via INTRAVENOUS

## 2012-02-23 MED ORDER — LIDOCAINE HCL (CARDIAC) 20 MG/ML IV SOLN
INTRAVENOUS | Status: DC | PRN
  Administered 2012-02-23: 50 mg via INTRAVENOUS

## 2012-02-23 MED ORDER — LIDOCAINE HCL 2 % IJ SOLN
INTRAMUSCULAR | Status: DC | PRN
  Administered 2012-02-23: 5 mL

## 2012-02-23 MED ORDER — LACTATED RINGERS IV SOLN
INTRAVENOUS | Status: DC
  Administered 2012-02-23 (×2): via INTRAVENOUS

## 2012-02-23 MED ORDER — OXYCODONE HCL 5 MG PO TABS
5.0000 mg | ORAL_TABLET | Freq: Once | ORAL | Status: AC | PRN
  Administered 2012-02-23: 5 mg via ORAL

## 2012-02-23 MED ORDER — HYDROMORPHONE HCL PF 1 MG/ML IJ SOLN
0.2500 mg | INTRAMUSCULAR | Status: DC | PRN
  Administered 2012-02-23 (×2): 0.5 mg via INTRAVENOUS

## 2012-02-23 MED ORDER — SUCCINYLCHOLINE CHLORIDE 20 MG/ML IJ SOLN
INTRAMUSCULAR | Status: DC | PRN
  Administered 2012-02-23: 60 mg via INTRAVENOUS

## 2012-02-23 MED ORDER — CEPHALEXIN 500 MG PO CAPS: 500.0000 mg | ORAL_CAPSULE | Freq: Three times a day (TID) | ORAL | Status: AC

## 2012-02-23 MED ORDER — CEFAZOLIN SODIUM-DEXTROSE 2-3 GM-% IV SOLR
2.0000 g | Freq: Once | INTRAVENOUS | Status: AC
  Administered 2012-02-23: 2 g via INTRAVENOUS

## 2012-02-23 MED ORDER — EPHEDRINE SULFATE 50 MG/ML IJ SOLN
INTRAMUSCULAR | Status: DC | PRN
  Administered 2012-02-23: 10 mg via INTRAVENOUS

## 2012-02-23 MED ORDER — ONDANSETRON HCL 4 MG/2ML IJ SOLN
INTRAMUSCULAR | Status: DC | PRN
  Administered 2012-02-23: 4 mg via INTRAVENOUS

## 2012-02-23 SURGICAL SUPPLY — 54 items
BANDAGE ADHESIVE 1X3 (GAUZE/BANDAGES/DRESSINGS) IMPLANT
BANDAGE ELASTIC 3 VELCRO ST LF (GAUZE/BANDAGES/DRESSINGS) ×2 IMPLANT
BANDAGE GAUZE ELAST BULKY 4 IN (GAUZE/BANDAGES/DRESSINGS) IMPLANT
BLADE MINI RND TIP GREEN BEAV (BLADE) IMPLANT
BLADE SURG 15 STRL LF DISP TIS (BLADE) ×1 IMPLANT
BLADE SURG 15 STRL SS (BLADE) ×1
BNDG ESMARK 4X9 LF (GAUZE/BANDAGES/DRESSINGS) IMPLANT
BRUSH SCRUB EZ PLAIN DRY (MISCELLANEOUS) IMPLANT
CLOTH BEACON ORANGE TIMEOUT ST (SAFETY) ×2 IMPLANT
CORDS BIPOLAR (ELECTRODE) IMPLANT
COVER MAYO STAND STRL (DRAPES) ×2 IMPLANT
COVER TABLE BACK 60X90 (DRAPES) IMPLANT
CUFF TOURNIQUET SINGLE 18IN (TOURNIQUET CUFF) IMPLANT
DECANTER SPIKE VIAL GLASS SM (MISCELLANEOUS) IMPLANT
DRAPE EXTREMITY T 121X128X90 (DRAPE) IMPLANT
DRAPE OEC MINIVIEW 54X84 (DRAPES) IMPLANT
DRAPE SURG 17X23 STRL (DRAPES) IMPLANT
DRAPE U 20/CS (DRAPES) ×4 IMPLANT
DRAPE U-SHAPE 47X51 STRL (DRAPES) ×2 IMPLANT
DRSG TEGADERM 2-3/8X2-3/4 SM (GAUZE/BANDAGES/DRESSINGS) ×2 IMPLANT
DRSG TEGADERM 4X4.75 (GAUZE/BANDAGES/DRESSINGS) ×2 IMPLANT
DURAPREP 26ML APPLICATOR (WOUND CARE) ×2 IMPLANT
ELECT REM PT RETURN 9FT ADLT (ELECTROSURGICAL) ×2
ELECTRODE REM PT RTRN 9FT ADLT (ELECTROSURGICAL) ×1 IMPLANT
GAUZE XEROFORM 1X8 LF (GAUZE/BANDAGES/DRESSINGS) IMPLANT
GLOVE BIO SURGEON STRL SZ 6.5 (GLOVE) ×2 IMPLANT
GLOVE BIOGEL M STRL SZ7.5 (GLOVE) ×2 IMPLANT
GLOVE BIOGEL PI IND STRL 7.0 (GLOVE) ×1 IMPLANT
GLOVE BIOGEL PI INDICATOR 7.0 (GLOVE) ×1
GLOVE ORTHO TXT STRL SZ7.5 (GLOVE) ×2 IMPLANT
GOWN PREVENTION PLUS XLARGE (GOWN DISPOSABLE) ×2 IMPLANT
GOWN PREVENTION PLUS XXLARGE (GOWN DISPOSABLE) ×4 IMPLANT
NEEDLE 27GAX1X1/2 (NEEDLE) ×2 IMPLANT
NS IRRIG 1000ML POUR BTL (IV SOLUTION) ×2 IMPLANT
PACK ARTHROSCOPY DSU (CUSTOM PROCEDURE TRAY) ×2 IMPLANT
PACK BASIN DAY SURGERY FS (CUSTOM PROCEDURE TRAY) ×2 IMPLANT
PADDING CAST ABS 4INX4YD NS (CAST SUPPLIES) ×1
PADDING CAST ABS COTTON 4X4 ST (CAST SUPPLIES) ×1 IMPLANT
PENCIL BUTTON HOLSTER BLD 10FT (ELECTRODE) ×2 IMPLANT
SPLINT PLASTER CAST XFAST 3X15 (CAST SUPPLIES) IMPLANT
SPLINT PLASTER XTRA FASTSET 3X (CAST SUPPLIES)
SPONGE GAUZE 4X4 12PLY (GAUZE/BANDAGES/DRESSINGS) ×2 IMPLANT
STOCKINETTE 4X48 STRL (DRAPES) IMPLANT
STRIP CLOSURE SKIN 1/2X4 (GAUZE/BANDAGES/DRESSINGS) ×2 IMPLANT
SUT PROLENE 3 0 PS 2 (SUTURE) ×2 IMPLANT
SUT VIC AB 3-0 PS1 18 (SUTURE) ×1
SUT VIC AB 3-0 PS1 18XBRD (SUTURE) ×1 IMPLANT
SYR 3ML 23GX1 SAFETY (SYRINGE) IMPLANT
SYR BULB 3OZ (MISCELLANEOUS) ×2 IMPLANT
SYR CONTROL 10ML LL (SYRINGE) IMPLANT
TOWEL OR 17X24 6PK STRL BLUE (TOWEL DISPOSABLE) ×2 IMPLANT
TRAY DSU PREP LF (CUSTOM PROCEDURE TRAY) IMPLANT
UNDERPAD 30X30 INCONTINENT (UNDERPADS AND DIAPERS) ×2 IMPLANT
WATER STERILE IRR 1000ML POUR (IV SOLUTION) ×2 IMPLANT

## 2012-02-23 NOTE — Op Note (Signed)
[id number] 

## 2012-02-23 NOTE — H&P (Signed)
Lucas Shepherd is an 78 y.o. male.   Chief Complaint: C/o continued pain and prominence of screw head left proximal humerus HPI: Today while looking for one of his lost golf balls he slipped and fell landing hard onto his left arm. He did not dislocate his shoulder. He did however develop a very painful proximal humeral region and on x-ray in our office was noted to have a comminuted surgical neck fracture of the humerus with some comminution of the greater tuberosity. This would be classified at least a 3 part humeral fracture.His x-rays revealed a comminuted greater tuberosity fracture and surgical neck fracture. He was congruously located within the glenoid. He was placed in a sling and swathe shoulder immobilizer construct. He was instructed to sleep in a recliner. X-rays on 12/21/11 revealed that he still has translation of his proximal humeral shaft due to the pull of the pectoralis major and latissimus dorsi muscles.  Lucas Shepherd is going to need ORIF with plate application.  Patient underwent ORIF of the fracture on 12/24/11.On exam in our office on 01/25/12 he did  not have his sling on and has been quite active with his arm. At his last visit we talked about doing pendulum exercises and table slides. These are allegedly passive exercises that do not put strain on the fracture site.   X-rays at this time demonstrate that his anterior inferior and middle screw have both backed out. The inferior screw has backed out at least 4 mm, the middle screw has backed out perhaps 1-2 mm. This would suggest that he is levering his plate against the humeral head fracture fragment. It was felt that he would need to be brought to the OR for screw removal and possible biceps tenolysis. Past Medical History  Diagnosis Date  . GERD (gastroesophageal reflux disease)     nisseum fundoplication 2010  . Anemia   . BPH (benign prostatic hyperplasia)   . Kidney stones     History of  . History of urinary  frequency   . Urinary urgency   . Hiatal hernia   . History of TMJ syndrome     mild  . Arthritis   . Blood transfusion   . GI bleed 12/12    got 3 units blood    Past Surgical History  Procedure Date  . Nissen fundoplication 2010  . Shoulder arthroscopy 1954    rt  . Abdominal surgery 1985    tummy tuc  . Colonoscopy   . Cataract extraction     left  . Esophagogastroduodenoscopy 06/12/2011    Procedure: ESOPHAGOGASTRODUODENOSCOPY (EGD);  Surgeon: Yancey Flemings, MD;  Location: [facility];  Service: Endoscopy;  Laterality: N/A;  . Shoulder arthroscopy 05/28/2011    left  . Orif humerus fracture 12/24/2011    Procedure: OPEN REDUCTION INTERNAL FIXATION (ORIF) PROXIMAL HUMERUS FRACTURE;  Surgeon: Wyn Forster., MD;  Location: [facility];  Service: Orthopedics;  Laterality: Left;  ORIF left humerus    Family History  Problem Relation Age of Onset  . Heart defect Father     aortic valve  . Nephrolithiasis Mother   . Lung cancer Brother     smoker  . Colon cancer Neg Hx   . Esophageal cancer Neg Hx   . Stomach cancer Neg Hx   . Rectal cancer Neg Hx    Social History:  reports that he quit smoking about 45 years ago. His smoking use included Cigarettes. He has a 12 pack-year  smoking history. He has never used smokeless tobacco. He reports that he drinks about .6 ounces of alcohol per week. He reports that he does not use illicit drugs.  Allergies: No Known Allergies  No prescriptions prior to admission    No results found for this or any previous visit (from the past 48 hour(s)).  No results found.   Pertinent items are noted in HPI.  There were no vitals taken for this visit.  General appearance: alert Head: Normocephalic, without obvious abnormality Neck: supple, symmetrical, trachea midline Resp: clear to auscultation bilaterally Cardio: regular rate and rhythm GI: normal findings: bowel sounds normal Extremities Exam of the left shoulder  reveals a healed surgical incision. N/V is intact. The screw head is palpable in the area of the incision. He has good ROM of the shoulder with little pain Pulses: 2+ and symmetric Skin: normal Neurologic: Grossly normal    Assessment/Plan Impression: S/P ORIF left proximal humerus   Plan: To the OR for screw removal and biceps tenolysis.The procedure, risks,benefits and post-op course were discussed with the patient at length and they were in agreement with the plan.   DASNOIT,Lucas Shepherd 02/23/2012, 9:49 AM    H&P documentation: 02/23/2012  -History and Physical Reviewed  -Patient has been re-examined  -No change in the plan of care  Wyn Forster, MD

## 2012-02-23 NOTE — Transfer of Care (Signed)
Immediate Anesthesia Transfer of Care Note  Patient: Lucas Shepherd  Procedure(s) Performed: Procedure(s) (LRB): HARDWARE REMOVAL (Left)  Patient Location: PACU  Anesthesia Type: General  Level of Consciousness: awake, alert , oriented and patient cooperative  Airway & Oxygen Therapy: Patient Spontanous Breathing and Patient connected to face mask oxygen  Post-op Assessment: Report given to PACU RN and Post -op Vital signs reviewed and stable  Post vital signs: Reviewed and stable  Complications: No apparent anesthesia complications

## 2012-02-23 NOTE — Brief Op Note (Signed)
02/23/2012  12:56 PM  PATIENT:  Lucas Shepherd  78 y.o. male  PRE-OPERATIVE DIAGNOSIS:  status post left humeral fracture/ hardware malposition  POST-OPERATIVE DIAGNOSIS:  status post left humeral fracture/hardware malposition  PROCEDURE:   HARDWARE REMOVAL (cancellous screw from proximal humeral head  SURGEON:  Surgeon(s) and Role:    * Wyn Forster., MD - Primary  PHYSICIAN ASSISTANT:   ASSISTANTS: Mallory Shirk.A-C   ANESTHESIA:   general  EBL:  Total I/O In: 1000 [I.V.:1000] Out: -   BLOOD ADMINISTERED:none  DRAINS: none   LOCAL MEDICATIONS USED:  XYLOCAINE   SPECIMEN:  No Specimen  DISPOSITION OF SPECIMEN:  Removed cancellous screw  COUNTS:  YES  TOURNIQUET:  * No tourniquets in log *  DICTATION: .Other Dictation: Dictation Number [id number]  PLAN OF CARE: Discharge to home after PACU  PATIENT DISPOSITION:  PACU - hemodynamically stable.

## 2012-02-23 NOTE — Anesthesia Procedure Notes (Signed)
Procedure Name: Intubation Date/Time: 02/23/2012 12:08 PM Performed by: Knox Cervi D Pre-anesthesia Checklist: Patient identified, Emergency Drugs available, Suction available and Patient being monitored Patient Re-evaluated:Patient Re-evaluated prior to inductionOxygen Delivery Method: Circle System Utilized Preoxygenation: Pre-oxygenation with 100% oxygen Intubation Type: IV induction Ventilation: Mask ventilation without difficulty Laryngoscope Size: Miller and 2 Grade View: Grade I Tube type: Oral Tube size: 8.0 mm Number of attempts: 1 Airway Equipment and Method: stylet,  oral airway and LTA kit utilized Placement Confirmation: ETT inserted through vocal cords under direct vision,  positive ETCO2 and breath sounds checked- equal and bilateral Secured at: 24 cm Tube secured with: Tape Dental Injury: Teeth and Oropharynx as per pre-operative assessment

## 2012-02-23 NOTE — Anesthesia Preprocedure Evaluation (Signed)
Anesthesia Evaluation  Patient identified by MRN, date of birth, ID band Patient awake    Reviewed: Allergy & Precautions, H&P , NPO status , Patient's Chart, lab work & pertinent test results, reviewed documented beta blocker date and time   Airway Mallampati: II TM Distance: >3 FB Neck ROM: full    Dental   Pulmonary neg pulmonary ROS,  breath sounds clear to auscultation        Cardiovascular negative cardio ROS  Rhythm:regular     Neuro/Psych  Neuromuscular disease negative psych ROS   GI/Hepatic Neg liver ROS, hiatal hernia, GERD-  Medicated and Controlled,  Endo/Other  negative endocrine ROS  Renal/GU Renal disease  negative genitourinary   Musculoskeletal   Abdominal   Peds  Hematology negative hematology ROS (+)   Anesthesia Other Findings See surgeon's H&P   Reproductive/Obstetrics negative OB ROS                           Anesthesia Physical Anesthesia Plan  ASA: II  Anesthesia Plan: General   Post-op Pain Management:    Induction: Intravenous  Airway Management Planned: Oral ETT  Additional Equipment:   Intra-op Plan:   Post-operative Plan: Extubation in OR  Informed Consent: I have reviewed the patients History and Physical, chart, labs and discussed the procedure including the risks, benefits and alternatives for the proposed anesthesia with the patient or authorized representative who has indicated his/her understanding and acceptance.   Dental Advisory Given  Plan Discussed with: CRNA and Surgeon  Anesthesia Plan Comments:         Anesthesia Quick Evaluation

## 2012-02-23 NOTE — Anesthesia Postprocedure Evaluation (Signed)
  Anesthesia Post-op Note  Patient: Lucas Shepherd  Procedure(s) Performed: Procedure(s) (LRB): HARDWARE REMOVAL (Left)  Patient Location: PACU  Anesthesia Type: General and GA combined with regional for post-op pain  Level of Consciousness: awake, alert  and oriented  Airway and Oxygen Therapy: Patient Spontanous Breathing  Post-op Pain: mild  Post-op Assessment: Post-op Vital signs reviewed  Post-op Vital Signs: Reviewed  Complications: No apparent anesthesia complications

## 2012-02-24 ENCOUNTER — Encounter (HOSPITAL_BASED_OUTPATIENT_CLINIC_OR_DEPARTMENT_OTHER): Payer: Self-pay | Admitting: Orthopedic Surgery

## 2012-02-24 NOTE — Op Note (Signed)
NAME:  Lucas Shepherd, Lucas Shepherd NO.:  [id number]  MEDICAL RECORD NO.:  [id number]  LOCATION:                                 FACILITY:  PHYSICIAN:  Lucas Fitch. Tiffanny Lamarche, M.D.      DATE OF BIRTH:  DATE OF PROCEDURE:  02/23/2012 DATE OF DISCHARGE:                              OPERATIVE REPORT   PREOPERATIVE DIAGNOSIS:  Status post open reduction and internal fixation of left humeral high surgical neck fracture with impending nonunion treated with Polaris titanium plate system December 24, 2011, with premature movement leading to dislodgement of 2 cancellous screws from humeral head, status post successful healing in a sling with documentation of adequate healing by CT scan.  POSTOPERATIVE DIAGNOSIS:  Status post open reduction and internal fixation of left humeral high surgical neck fracture with impending nonunion treated with Polaris titanium plate system December 24, 2011, with premature movement leading to dislodgement of 2 cancellous screws from humeral head, status post successful healing in a sling with documentation of adequate healing by CT scan.  OPERATION: 1. Removal of loose cancellous screw from Polaris proximal humeral     plate system left shoulder. 2. Repositioning of most proximal humeral head screw.  OPERATING SURGEON:  Lucas Fitch. Alucard Fearnow, MD  ASSISTANT:  Jonni Sanger, PA-C  ANESTHESIA:  Endotracheal.  SUPERVISING ANESTHESIOLOGIST:  Janetta Hora. Gelene Mink, M.D.  INDICATIONS:  Lucas Shepherd is a very active 78 year old gentleman. He is status post repair of his left rotator cuff in the fall of 2012. While playing golf in the spring of 2013, he slipped on wet ground falling hard onto his outstretched left arm sustaining what he thought was a dislocation.  It turned out, he had a complex 3 part proximal humeral fracture with some comminution of the greater tuberosity posteriorly.  His initial position of his humeral head and shaft was acceptable,  therefore we began close treatment, however due to a relentless pole of his pectoralis major, and his latissimus dorsi muscles, he began to develop medial migration of the humeral shaft and a malunion.  We then recommended that he schedule open reduction and internal fixation with the application of an Acumed Polaris titanium plate set.  Surgery was accomplished on December 24, 2011,  Initially, he had excellent pain relief.  We advised Lucas Shepherd to remain in a sling initially followed by very gentle pendulum exercises.  Lucas Shepherd is very eager and spry and elected to be more active due to the fact that his arm felt quite well.  He did return to our office without a sling and was noted to have dislodged 2 of his screws in the proximal humeral head.  We explained him the biomechanics of a proximal humeral plate system.  The plate is affixed to the diaphysis distally with stout locking screws.  We used cancellous lag screws to reduce the humeral head and these are prone to lever out not unlike a claw hammer pulling a nail out of a board.  We informed Lucas Shepherd that was in his best interest to remain in a sling and to reassure the fracture to heal.  He was in a sling until several weeks postop when x-rays suggested  solid healing.  To be absolutely certain, he was sent for CT scan, which revealed abundant callus bridging the fracture site.  One of the screws that had backed out, was quite prominent sitting more than a cm proud.  This was strumming beneath his deltoid muscle.  We advised Lucas Shepherd to undergo procedure to remove this screw and we would also plan on tightening the remaining cancellous screws in the humeral head at that time.  After informed consent, he was brought to the operating room at this time where preoperatively he was interviewed by Lucas Shepherd in the holding area.  General anesthesia by endotracheal technique was recommended and accepted.   Questions were invited and answered in detail.  PROCEDURE IN DETAIL:  Lucas Shepherd was brought to room 2 of the [facility] and placed in supine position on the operating table.  Under Lucas Shepherd direct supervision, general anesthesia by endotracheal technique was induced followed by careful position in the beach-chair position with aid of a torso and head holder designed for shoulder arthroscopy.  The entire left upper extremity and forequarter prepped with DuraPrep and draped with impervious arthroscopy drapes.  2 g of Ancef were administered as an IV prophylactic antibiotic per weight protocol. Following routine surgical time-out, procedure commenced with resection of 4 cm segment of his prior surgical scar.  Subcutaneous tissues were carefully divided taking care to identify the scar at the deltopectoral interval.  This was very densely scarred in the region of the cephalic vein.  With great care, a small rim of deltoid muscle was dissected away from the cephalic vein, down to the pseudomembrane surrounding the plate.  The plate was identified by palpation followed by clearing of soft tissues with a Art therapist.  The proud screw was immediately evident by palpation. The most proud screw was the most distal of the cancellous screws.  The second most distal screw was somewhat loose and was replaced in an anatomically correct position, flushed to the plate by use of the proper Acumed screw driver.  The most proximal screw was slightly proud due to a slight change in its angulation relative to the plate but otherwise it was secured.  The shaft screws were properly affixed to the plate and the diaphysis of the proximal humerus.  After removal of the most proud screw and tightening the remaining screws, the wound was irrigated and subsequently closed with subcutaneous 2-0 Vicryl and intradermal 3-0 Prolene suture.  A C-arm fluoroscope was used to confirm that all  screws were in proper position.  There were no apparent complications.  For aftercare, Lucas Shepherd wound was infiltrated with 2% lidocaine for postop analgesia.  He will be discharged with prescriptions for Percocet 5 mg 1 p.o. q.4-6 h. p.r.n. pain, 20 tablets, without refill. He will use Keflex 500 mg 1 p.o. q.8 h. x4 days as a prophylactic antibiotic.     Lucas Shepherd, M.D.     RVS/MEDQ  D:  02/23/2012  T:  02/24/2012  Job:  [id number]

## 2012-11-13 ENCOUNTER — Telehealth: Payer: Self-pay | Admitting: Gastroenterology

## 2012-11-13 NOTE — Telephone Encounter (Signed)
On call note at 1715. Pt has worsening constipation for the past 1-2 weeks. Now feels very constipated and has urge for BM but only small amounts of liquids stool passing today. He tried Fleets enema and a Ducolax pill earlier today without results. Had colonoscopy in 01/2009 showing mild diverticulosis and hemorrhoids. No abd pain, N/V, fevers. Advised clear liquid diet until he has a BM, Ducolax 2 tablets now and repeat in 6 hours, if no results. Contact us or go to ED if symptoms persist or worsen.

## 2012-11-22 ENCOUNTER — Encounter: Payer: Self-pay | Admitting: Gastroenterology

## 2012-11-22 ENCOUNTER — Ambulatory Visit (INDEPENDENT_AMBULATORY_CARE_PROVIDER_SITE_OTHER): Payer: Medicare Other | Admitting: Gastroenterology

## 2012-11-22 ENCOUNTER — Other Ambulatory Visit (INDEPENDENT_AMBULATORY_CARE_PROVIDER_SITE_OTHER): Payer: Medicare Other

## 2012-11-22 VITALS — BP 110/68 | HR 68 | Ht 72.0 in | Wt 197.1 lb

## 2012-11-22 DIAGNOSIS — K5900 Constipation, unspecified: Secondary | ICD-10-CM

## 2012-11-22 DIAGNOSIS — R195 Other fecal abnormalities: Secondary | ICD-10-CM

## 2012-11-22 DIAGNOSIS — M48061 Spinal stenosis, lumbar region without neurogenic claudication: Secondary | ICD-10-CM

## 2012-11-22 LAB — CBC WITH DIFFERENTIAL/PLATELET
Basophils Absolute: 0.1 10*3/uL (ref 0.0–0.1)
Eosinophils Absolute: 0.2 10*3/uL (ref 0.0–0.7)
HCT: 42.5 % (ref 39.0–52.0)
Hemoglobin: 14.6 g/dL (ref 13.0–17.0)
Lymphs Abs: 1.8 10*3/uL (ref 0.7–4.0)
MCHC: 34.5 g/dL (ref 30.0–36.0)
Monocytes Absolute: 1.1 10*3/uL — ABNORMAL HIGH (ref 0.1–1.0)
Neutro Abs: 5.6 10*3/uL (ref 1.4–7.7)
RDW: 13 % (ref 11.5–14.6)

## 2012-11-22 LAB — HEPATIC FUNCTION PANEL
Bilirubin, Direct: 0.2 mg/dL (ref 0.0–0.3)
Total Bilirubin: 0.6 mg/dL (ref 0.3–1.2)
Total Protein: 6.6 g/dL (ref 6.0–8.3)

## 2012-11-22 LAB — IBC PANEL
Saturation Ratios: 27.2 % (ref 20.0–50.0)
Transferrin: 228.8 mg/dL (ref 212.0–360.0)

## 2012-11-22 LAB — COMPREHENSIVE METABOLIC PANEL
ALT: 17 U/L (ref 0–53)
AST: 21 U/L (ref 0–37)
Creatinine, Ser: 1 mg/dL (ref 0.4–1.5)
Total Bilirubin: 0.6 mg/dL (ref 0.3–1.2)

## 2012-11-22 LAB — VITAMIN B12: Vitamin B-12: >1500 pg/mL — ABNORMAL HIGH (ref 211–911)

## 2012-11-22 MED ORDER — MOVIPREP 100 G PO SOLR: 1.0000 | Freq: Once | ORAL | Status: DC

## 2012-11-22 NOTE — Progress Notes (Signed)
This is a 79-year-old Caucasian male who last had colonoscopy in 2010.  He's had multiple orthopedic operations and multiple spinal and sacroiliac injections over the last 6 months because of spinal stenosis.  He now has a one-week history of constipation with the sensations of" a blockage" , and has used enemas and laxatives with mild improvement.  He denies melena or hematochezia or abdominal pain.  He is on tramadol 50 mg twice a day but denies other narcotics.  Upper GI issues have revolve around a Dieulafoy bleeding lesion require emergency endoscopy in January of 2013.  He denies current upper GI complaints.  Family history is noncontributory.  Current Medications, Allergies, Past Medical History, Past Surgical History, Family History and Social History were reviewed in [employer] record.  ROS: All systems were reviewed and are negative unless otherwise stated in the HPI.          Physical Exam: Awake and alert no acute distress.  Blood pressure 110/68, pulse 68 and weight 197 with a BMI of 26.73.  Chest clear reason regular rhythm without murmurs gallops or rubs.  There is no organomegaly, abdominal masses or tenderness.  Bowel sounds are normal.  Specks of rectum shows no fissures or fistulae or other abnormalities.  Rectal exam shows no masses or tenderness or impaction.  There is all stool present which is guaiac positive.  Mental status normal.    Assessment and Plan: New set constipation in a 79 year old Caucasian male with negative colonoscopy 4-5 years ago.  I've scheduled colonoscopy ASAP and will check CBC, anemia and metabolic profiles.  We will give him a Movie bowel select right preparation. Encounter Diagnosis  Name Primary?  . Heme + stool Yes

## 2012-11-22 NOTE — Patient Instructions (Addendum)
You have been scheduled for a colonoscopy with propofol. Please follow written instructions given to you at your visit today.  Please pick up your prep kit at the pharmacy within the next 1-3 days. If you use inhalers (even only as needed), please bring them with you on the day of your procedure. Your physician has requested that you go to www.startemmi.com and enter the access code given to you at your visit today. This web site gives a general overview about your procedure. However, you should still follow specific instructions given to you by our office regarding your preparation for the procedure.  Your physician has requested that you go to the basement for the following lab work before leaving today: Anemia Panel and BMP, CBC, TSH, and Liver Function Panel.

## 2012-11-23 ENCOUNTER — Ambulatory Visit (AMBULATORY_SURGERY_CENTER): Payer: Medicare Other | Admitting: Gastroenterology

## 2012-11-23 ENCOUNTER — Encounter: Payer: Self-pay | Admitting: Gastroenterology

## 2012-11-23 VITALS — BP 146/86 | HR 55 | Temp 97.2°F | Resp 18 | Ht 72.0 in | Wt 211.0 lb

## 2012-11-23 DIAGNOSIS — Z1211 Encounter for screening for malignant neoplasm of colon: Secondary | ICD-10-CM

## 2012-11-23 DIAGNOSIS — R195 Other fecal abnormalities: Secondary | ICD-10-CM

## 2012-11-23 DIAGNOSIS — K644 Residual hemorrhoidal skin tags: Secondary | ICD-10-CM

## 2012-11-23 DIAGNOSIS — K5900 Constipation, unspecified: Secondary | ICD-10-CM

## 2012-11-23 MED ORDER — SODIUM CHLORIDE 0.9 % IV SOLN: 500.0000 mL | INTRAVENOUS | Status: DC

## 2012-11-23 NOTE — Op Note (Signed)
[facility] [address] [place], [address]   COLONOSCOPY PROCEDURE REPORT  PATIENT: Solly, Derasmo  MR#: [id number] BIRTHDATE: 05-Jul-1929 , 83  yrs. old GENDER: Male ENDOSCOPIST: Mardella Layman, MD, [facility] REFERRED BY: PROCEDURE DATE:  11/23/2012 PROCEDURE:   Colonoscopy, screening ASA CLASS:   Class II INDICATIONS:Average risk patient for colon cancer and Change in bowel habits. MEDICATIONS: propofol (Diprivan) 200mg  IV  DESCRIPTION OF PROCEDURE:   After the risks and benefits and of the procedure were explained, informed consent was obtained.  A digital rectal exam revealed no abnormalities of the rectum.    The LB CF-[id number]  endoscope was introduced through the anus and advanced to the cecum, which was identified by both the appendix and ileocecal valve .  The quality of the prep was good, using MoviPrep .  The instrument was then slowly withdrawn as the colon was fully examined.     COLON FINDINGS: A normal appearing cecum, ileocecal valve, and appendiceal orifice were identified.  The ascending, hepatic flexure, transverse, splenic flexure, descending, sigmoid colon and rectum appeared unremarkable.  No polyps or cancers were seen. External hemorrhoids were found.     Retroflexed views revealed external hemorrhoids.     The scope was then withdrawn from the patient and the procedure completed.  COMPLICATIONS: There were no complications. ENDOSCOPIC IMPRESSION: 1.   Normal colon..no polyps,obstruction or bleeding noted. 2.   External hemorrhoids 3.    Slow transit constipation  RECOMMENDATIONS: 1.Continue current medications 2. Miralax 8 ozs qhs 3. Trial Linzess 145 mg daily   REPEAT EXAM:  cc:  _______________________________ eSignedMardella Layman, MD, [facility] 11/23/2012 3:29 PM     PATIENT NAME:  Ihan, Pat MR#: [id number]

## 2012-11-23 NOTE — Patient Instructions (Addendum)
YOU HAD AN ENDOSCOPIC PROCEDURE TODAY AT THE [facility]: Refer to the procedure report that was given to you for any specific questions about what was found during the examination.  If the procedure report does not answer your questions, please call your gastroenterologist to clarify.  If you requested that your care partner not be given the details of your procedure findings, then the procedure report has been included in a sealed envelope for you to review at your convenience later.  YOU SHOULD EXPECT: Some feelings of bloating in the abdomen. Passage of more gas than usual.  Walking can help get rid of the air that was put into your GI tract during the procedure and reduce the bloating. If you had a lower endoscopy (such as a colonoscopy or flexible sigmoidoscopy) you may notice spotting of blood in your stool or on the toilet paper. If you underwent a bowel prep for your procedure, then you may not have a normal bowel movement for a few days.  DIET: Your first meal following the procedure should be a light meal and then it is ok to progress to your normal diet.  A half-sandwich or bowl of soup is an example of a good first meal.  Heavy or fried foods are harder to digest and may make you feel nauseous or bloated.  Likewise meals heavy in dairy and vegetables can cause extra gas to form and this can also increase the bloating.  Drink plenty of fluids but you should avoid alcoholic beverages for 24 hours. INCREASE THE FIBER IN YOUR DIET.  ACTIVITY: Your care partner should take you home directly after the procedure.  You should plan to take it easy, moving slowly for the rest of the day.  You can resume normal activity the day after the procedure however you should NOT DRIVE or use heavy machinery for 24 hours (because of the sedation medicines used during the test).    SYMPTOMS TO REPORT IMMEDIATELY: A gastroenterologist can be reached at any hour.  During normal business hours, 8:30 AM to  5:00 PM Monday through Friday, call [phone].  After hours and on weekends, please call the GI answering service at [phone] who will take a message and have the physician on call contact you.   Following lower endoscopy (colonoscopy or flexible sigmoidoscopy):  Excessive amounts of blood in the stool  Significant tenderness or worsening of abdominal pains  Swelling of the abdomen that is new, acute  Fever of 100F or higher  FOLLOW UP: If any biopsies were taken you will be contacted by phone or by letter within the next 1-3 weeks.  Call your gastroenterologist if you have not heard about the biopsies in 3 weeks.  Our staff will call the home number listed on your records the next business day following your procedure to check on you and address any questions or concerns that you may have at that time regarding the information given to you following your procedure. This is a courtesy call and so if there is no answer at the home number and we have not heard from you through the emergency physician on call, we will assume that you have returned to your regular daily activities without incident.  SIGNATURES/CONFIDENTIALITY: You and/or your care partner have signed paperwork which will be entered into your electronic medical record.  These signatures attest to the fact that that the information above on your After Visit Summary has been reviewed and is understood.  Full  responsibility of the confidentiality of this discharge information lies with you and/or your care-partner.

## 2012-11-23 NOTE — Progress Notes (Addendum)
Patient did not have preoperative order for IV antibiotic SSI prophylaxis. (G8918)  Patient did not experience any of the following events: a burn prior to discharge; a fall within the facility; wrong site/side/patient/procedure/implant event; or a hospital transfer or hospital admission upon discharge from the facility. (G8907)  

## 2012-11-24 ENCOUNTER — Telehealth: Payer: Self-pay | Admitting: *Deleted

## 2012-11-24 ENCOUNTER — Telehealth: Payer: Self-pay | Admitting: Gastroenterology

## 2012-11-24 MED ORDER — LINACLOTIDE 145 MCG PO CAPS: ORAL_CAPSULE | ORAL | Status: DC

## 2012-11-24 NOTE — Telephone Encounter (Signed)
  Follow up Call-  Call back number 11/23/2012 07/24/2011  Post procedure Call Back phone  # [phone] ok to leave a message  Permission to leave phone message Yes -     Patient questions:  Do you have a fever, pain , or abdominal swelling? no Pain Score  0 *  Have you tolerated food without any problems? yes  Have you been able to return to your normal activities? yes  Do you have any questions about your discharge instructions: Diet   no Medications  no Follow up visit  no  Do you have questions or concerns about your Care? no  Actions: * If pain score is 4 or above: No action needed, pain <4. Pt. States he didn't receive rx. For Linzess(to trial).

## 2012-11-24 NOTE — Telephone Encounter (Signed)
Pt states he remembered he never had constipation until he started pantoprazole; states Dr Earl Gala ordered it after his Dieulafoy lesion. He received the Linzess samples today, but would like to try to stop the pantoprazole first to see if that is the problem. Explained he can try to stop the pantoprazole, but if he develops heartburn, he needs to start back or call us. Pt states he can't get indigestion because he's had a fundoplication. Per 07/24/11 EGD, you ordered a PPI for 6 weeks only. OK to stop the pantoprazole for a trial or permanently? Thanks.

## 2012-11-24 NOTE — Telephone Encounter (Signed)
lmom for pt to call back. Per Dr Jarold Motto, take 145 mcg of Linzess daily with Miralax at night; samples left at front desk.

## 2012-11-24 NOTE — Telephone Encounter (Signed)
Give rx 

## 2012-11-25 NOTE — Telephone Encounter (Signed)
Ok to stop

## 2012-12-06 ENCOUNTER — Telehealth: Payer: Self-pay | Admitting: Gastroenterology

## 2012-12-06 NOTE — Telephone Encounter (Signed)
Mardella Layman, MD at 11/25/2012 8:17 AM   Status: Signed            Ok to stop        Linna Hoff, RN at 11/24/2012 4:21 PM    Status: Signed             Pt states he remembered he never had constipation until he started pantoprazole; states Dr Earl Gala ordered it after his Dieulafoy lesion. He received the Linzess samples today, but would like to try to stop the pantoprazole first to see if that is the problem. Explained he can try to stop the pantoprazole, but if he develops heartburn, he needs to start back or call us. Pt states he can't get indigestion because he's had a fundoplication. Per 07/24/11 EGD, you ordered a PPI for 6 weeks only. OK to stop the pantoprazole for a trial or permanently? Thanks   Pt reports he stopped pantoprazole and he has had BMs for 12 straight days; some days he may need a little boost from Miralax, but he never tried Linzess. Per old note, OK to stop a PPI.

## 2012-12-06 NOTE — Telephone Encounter (Signed)
lmom for pt to call back

## 2012-12-30 ENCOUNTER — Telehealth: Payer: Self-pay | Admitting: Gastroenterology

## 2012-12-30 NOTE — Telephone Encounter (Signed)
Informed pt of lab results and asked him to stop oral Vit B12.   Pt states he never received a post procedure letter after 11/23/12 COLON; none in computer. Dr Jarold Motto, please advise. Thanks.

## 2013-01-02 NOTE — Telephone Encounter (Signed)
lmom for pt to call back

## 2013-01-02 NOTE — Telephone Encounter (Signed)
There is no tissue to send a letter about

## 2013-01-02 NOTE — Telephone Encounter (Signed)
Informed pt there were no bx done. Pt stated understanding. Mailed him a copy of his labs.

## 2014-01-07 ENCOUNTER — Emergency Department (HOSPITAL_COMMUNITY)
Admission: EM | Admit: 2014-01-07 | Discharge: 2014-01-07 | Disposition: A | Discharge status: Home / Self Care | Payer: Medicare Other | Attending: Emergency Medicine | Admitting: Emergency Medicine

## 2014-01-07 ENCOUNTER — Encounter (HOSPITAL_COMMUNITY): Payer: Self-pay | Admitting: Emergency Medicine

## 2014-01-07 ENCOUNTER — Emergency Department (HOSPITAL_COMMUNITY): Payer: Medicare Other

## 2014-01-07 DIAGNOSIS — N400 Enlarged prostate without lower urinary tract symptoms: Secondary | ICD-10-CM | POA: Insufficient documentation

## 2014-01-07 DIAGNOSIS — Z79899 Other long term (current) drug therapy: Secondary | ICD-10-CM | POA: Insufficient documentation

## 2014-01-07 DIAGNOSIS — R109 Unspecified abdominal pain: Secondary | ICD-10-CM | POA: Insufficient documentation

## 2014-01-07 DIAGNOSIS — Z87442 Personal history of urinary calculi: Secondary | ICD-10-CM | POA: Insufficient documentation

## 2014-01-07 DIAGNOSIS — Z87891 Personal history of nicotine dependence: Secondary | ICD-10-CM | POA: Insufficient documentation

## 2014-01-07 DIAGNOSIS — K219 Gastro-esophageal reflux disease without esophagitis: Secondary | ICD-10-CM | POA: Insufficient documentation

## 2014-01-07 DIAGNOSIS — M129 Arthropathy, unspecified: Secondary | ICD-10-CM | POA: Insufficient documentation

## 2014-01-07 DIAGNOSIS — D649 Anemia, unspecified: Secondary | ICD-10-CM | POA: Insufficient documentation

## 2014-01-07 LAB — BASIC METABOLIC PANEL
BUN: 14 mg/dL (ref 6–23)
CHLORIDE: 101 meq/L (ref 96–112)
CO2: 24 mEq/L (ref 19–32)
Calcium: 9.5 mg/dL (ref 8.4–10.5)
Creatinine, Ser: 0.81 mg/dL (ref 0.50–1.35)
GFR calc Af Amer: >90 mL/min (ref >90)
GFR calc non Af Amer: 81 mL/min — ABNORMAL LOW (ref >90)
GLUCOSE: 97 mg/dL (ref 70–99)
POTASSIUM: 4.2 meq/L (ref 3.7–5.3)
Sodium: 139 mEq/L (ref 137–147)

## 2014-01-07 LAB — URINALYSIS, ROUTINE W REFLEX MICROSCOPIC
Bilirubin Urine: NEGATIVE
GLUCOSE, UA: NEGATIVE mg/dL
Hgb urine dipstick: NEGATIVE
Ketones, ur: NEGATIVE mg/dL
Leukocytes, UA: NEGATIVE
Nitrite: NEGATIVE
Protein, ur: NEGATIVE mg/dL
SPECIFIC GRAVITY, URINE: 1.023 (ref 1.005–1.030)
Urobilinogen, UA: 1 mg/dL (ref 0.0–1.0)
pH: 6 (ref 5.0–8.0)

## 2014-01-07 LAB — CBC WITH DIFFERENTIAL/PLATELET
BASOS ABS: 0 10*3/uL (ref 0.0–0.1)
Basophils Relative: 0 % (ref 0–1)
Eosinophils Absolute: 0.1 10*3/uL (ref 0.0–0.7)
Eosinophils Relative: 1 % (ref 0–5)
HCT: 42.6 % (ref 39.0–52.0)
Hemoglobin: 14.7 g/dL (ref 13.0–17.0)
LYMPHS ABS: 1.5 10*3/uL (ref 0.7–4.0)
LYMPHS PCT: 15 % (ref 12–46)
MCH: 32 pg (ref 26.0–34.0)
MCHC: 34.5 g/dL (ref 30.0–36.0)
MCV: 92.8 fL (ref 78.0–100.0)
Monocytes Absolute: 1 10*3/uL (ref 0.1–1.0)
Monocytes Relative: 10 % (ref 3–12)
NEUTROS ABS: 7.4 10*3/uL (ref 1.7–7.7)
Neutrophils Relative %: 74 % (ref 43–77)
PLATELETS: 199 10*3/uL (ref 150–400)
RBC: 4.59 MIL/uL (ref 4.22–5.81)
RDW: 13.1 % (ref 11.5–15.5)
WBC: 10 10*3/uL (ref 4.0–10.5)

## 2014-01-07 IMAGING — CT CT ABD-PELV W/O CM
1 series · 15 of 29 positions shown, 19 images · non-contrast
Comparison: [DATE]

CLINICAL DATA: Left flank pain

EXAM:
CT ABDOMEN AND PELVIS WITHOUT CONTRAST
TECHNIQUE: Multidetector CT imaging of the abdomen and pelvis was performed
following the standard protocol without IV contrast.

[Series 4: lung · axial · 0.85mm/px · z∈[-181,-56]mm · 15 of 29 slices shown, 19 images]
[im 3/29  soft-tissue]
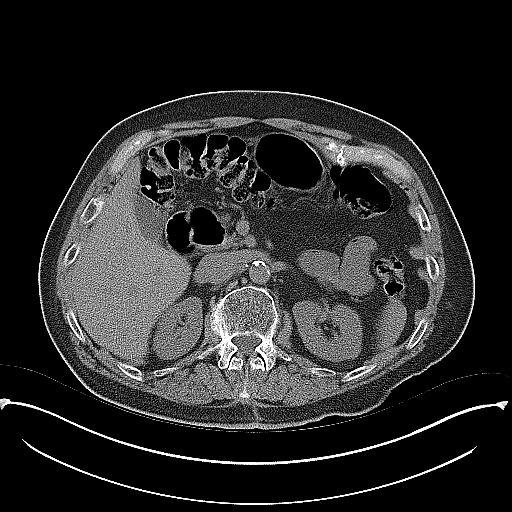
[im 3/29  bone]
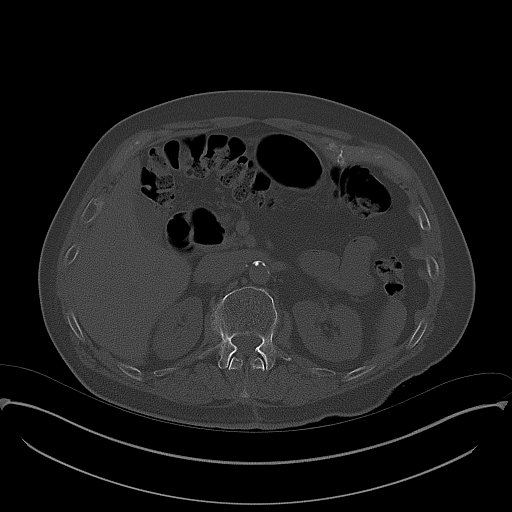
[im 5/29  soft-tissue]
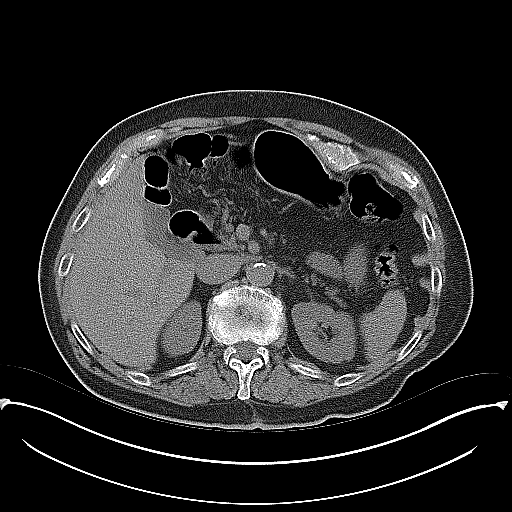
[im 7/29  soft-tissue]
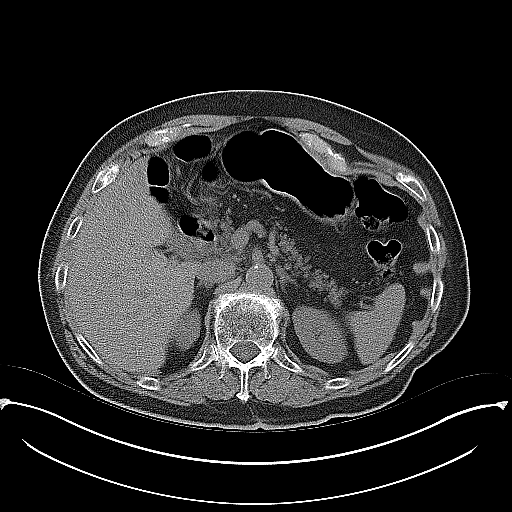
[im 9/29  soft-tissue]
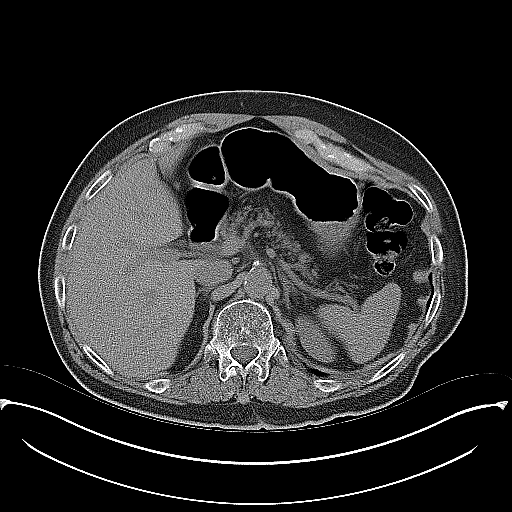
[im 11/29  soft-tissue]
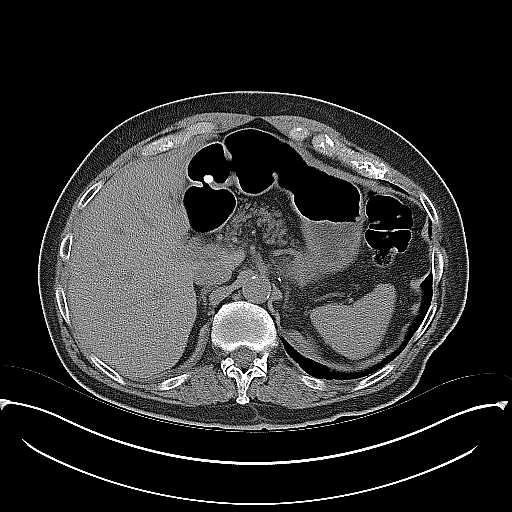
[im 13/29  soft-tissue]
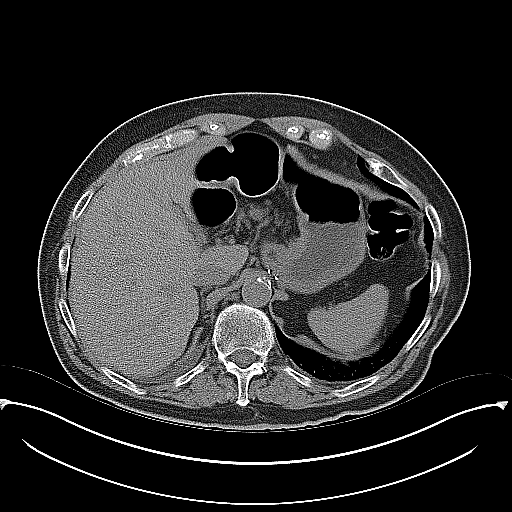
[im 15/29  soft-tissue]
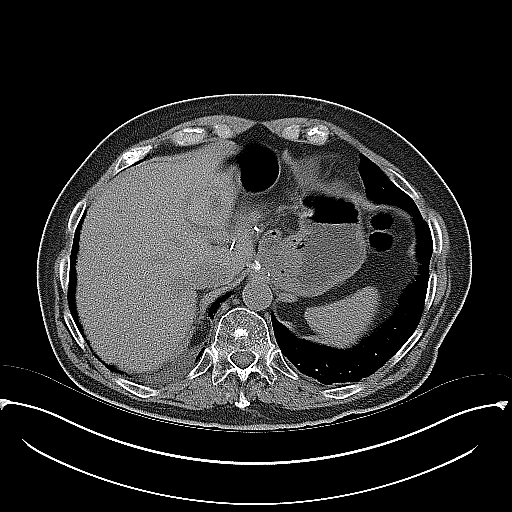
[im 17/29  soft-tissue]
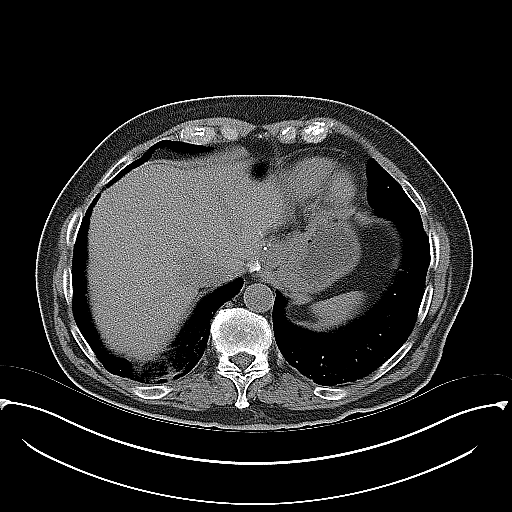
[im 19/29  soft-tissue]
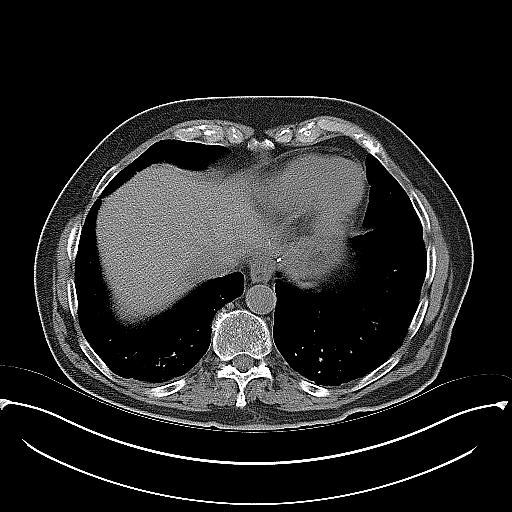
[im 19/29  bone]
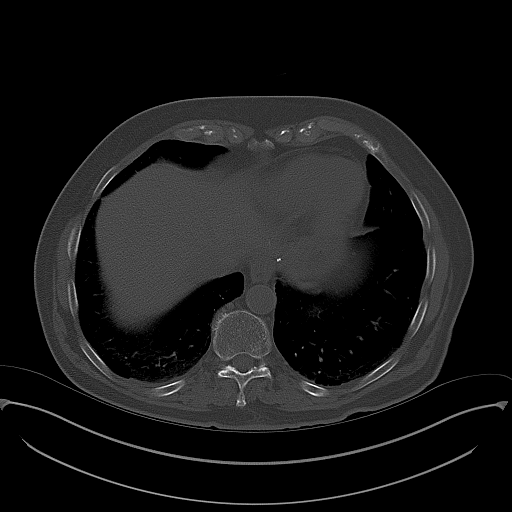
[im 21/29  soft-tissue]
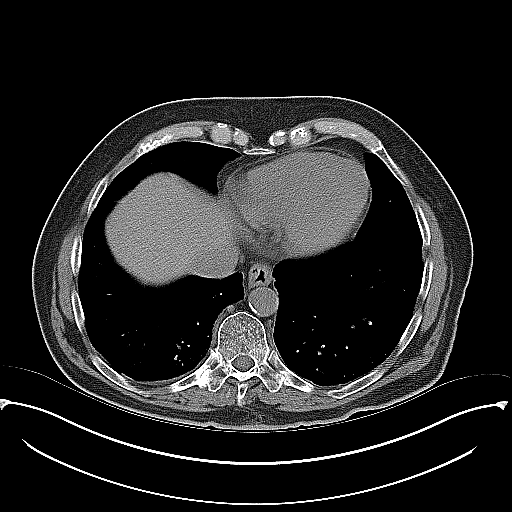
[im 23/29  soft-tissue]
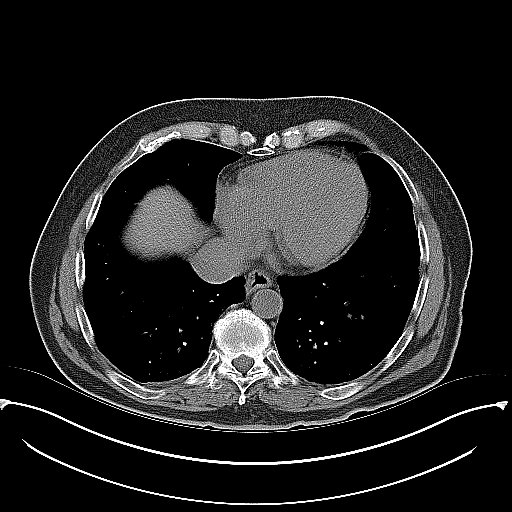
[im 25/29  soft-tissue]
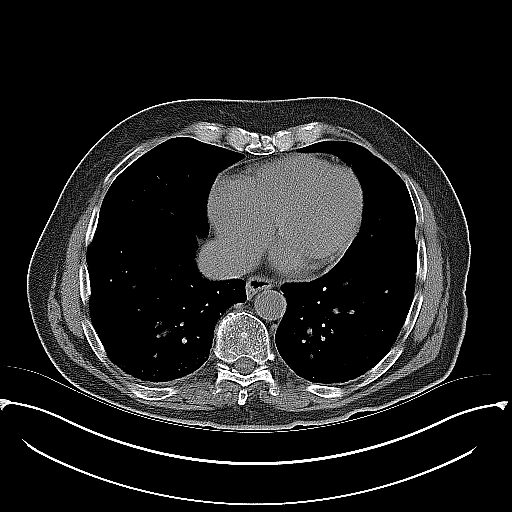
[im 25/29  lung]
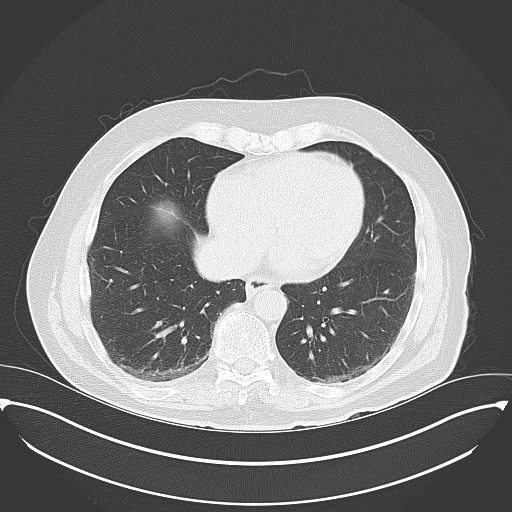
[im 26/29  lung]
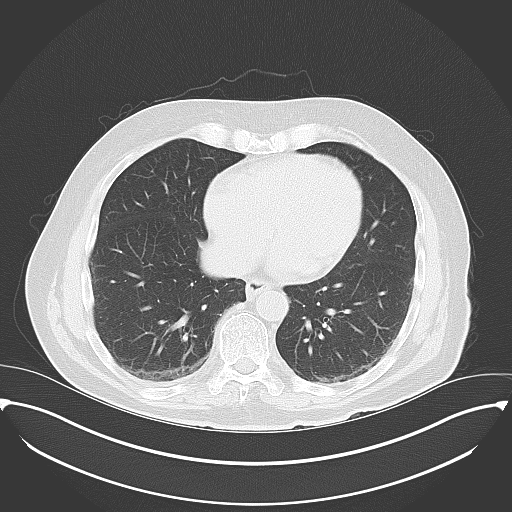
[im 27/29  soft-tissue]
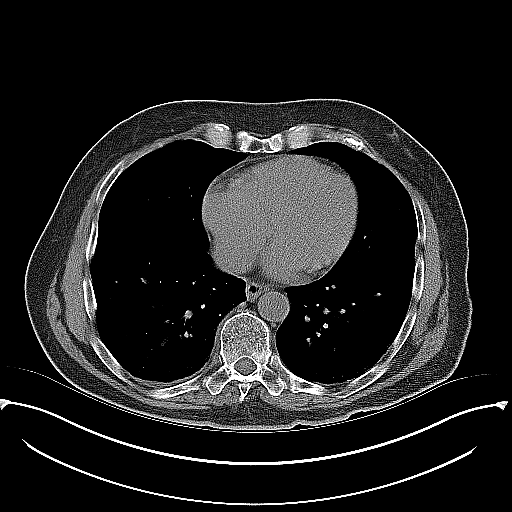
[im 27/29  lung]
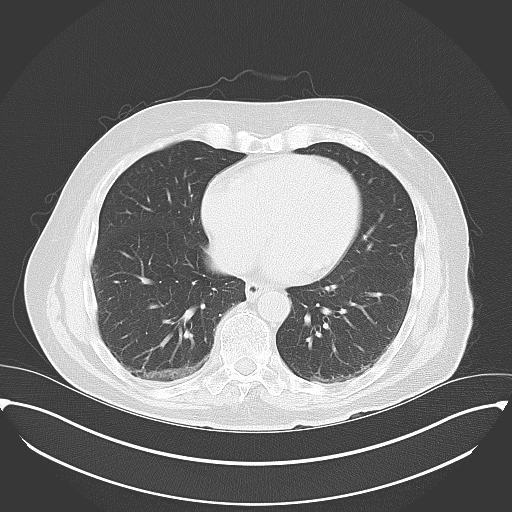
[im 28/29  lung]
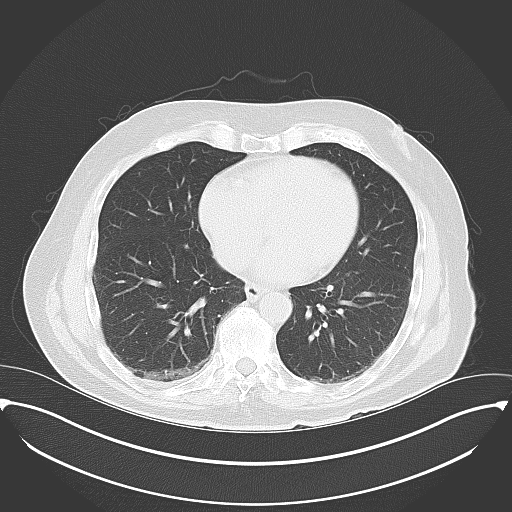

[15 of 29 positions shown; findings below may reference images not displayed]

FINDINGS: Lung bases are unremarkable. There are postsurgical changes lumbar
spine with posterior fixation material at L2-L3 level. Diffuse
osteopenia. Mild degenerative changes thoracic spine. Degenerative
changes with disc space flattening and vacuum disc phenomenon lower
lumbar spine.

Heart size within normal limits. Postsurgical changes are noted GE
junction region. Unenhanced liver shows no biliary ductal
dilatation. Atherosclerotic calcifications of abdominal aorta and
iliac arteries. No aortic aneurysm. No calcified gallstones are
noted within gallbladder.

Atrophic partially fatty replaced pancreas. The spleen and adrenal
glands are unremarkable. Unenhanced kidneys are symmetrical in size.
No nephrolithiasis. No hydronephrosis or hydroureter.

No calcified ureteral calculi are noted bilaterally. Moderate stool
noted in transverse colon and right colon. No pericecal
inflammation. The terminal ileum is unremarkable.

The urinary bladder is unremarkable. Prostate gland measures 4.5 x
3.4 cm. No small bowel obstruction. No ascites or free air. No
adenopathy.

There is small left hydrocele measures about 3 cm. There is a small
right inguinal scrotal canal hernia containing fat measures about
2.4 cm without evidence of acute complication best seen in sagittal
image 56. Small left inguinal scrotal canal hernia containing fat in
axial image 78 and sagittal image 79 measures about 3 cm. No
evidence of acute complication.
IMPRESSION: 1. No nephrolithiasis.  No hydronephrosis or hydroureter.
2. No calcified ureteral calculi.
3. Degenerative changes lumbar spine. Postsurgical changes at L2-L3
level.
4. A left hydrocele is noted measures 3 cm. Small bilateral inguinal
scrotal canal hernia containing fat without evidence of acute
complication.

## 2014-01-07 MED ORDER — SODIUM CHLORIDE 0.9 % IV BOLUS (SEPSIS)
500.0000 mL | Freq: Once | INTRAVENOUS | Status: AC
  Administered 2014-01-07: 11:00:00 via INTRAVENOUS

## 2014-01-07 MED ORDER — TRAMADOL HCL 50 MG PO TABS: 50.0000 mg | ORAL_TABLET | Freq: Four times a day (QID) | ORAL | Status: DC | PRN

## 2014-01-07 MED ORDER — ONDANSETRON HCL 4 MG/2ML IJ SOLN
4.0000 mg | Freq: Once | INTRAMUSCULAR | Status: AC
  Administered 2014-01-07: 4 mg via INTRAVENOUS
  Filled 2014-01-07: qty 2

## 2014-01-07 MED ORDER — HYDROMORPHONE HCL PF 1 MG/ML IJ SOLN
1.0000 mg | Freq: Once | INTRAMUSCULAR | Status: AC
  Administered 2014-01-07: 1 mg via INTRAVENOUS
  Filled 2014-01-07: qty 1

## 2014-01-07 MED ORDER — SODIUM CHLORIDE 0.9 % IV SOLN: INTRAVENOUS | Status: DC

## 2014-01-07 NOTE — ED Notes (Signed)
Pt from home reports L flank pain that radiates to groin x3 days. Pt states that he has hx of the same. Pt denies N/V/D, urinary s/sx. Pt adds that he had feeling of "indigestion" last pm. Pt is A&O and in NAD

## 2014-01-07 NOTE — Discharge Instructions (Signed)
Take pain medication as directed. If symptoms persist I would recommend CT scan with contrast. Anything gets worse please return.

## 2014-01-07 NOTE — ED Provider Notes (Signed)
CSN: [id number]     Arrival date & time 01/07/14  0930 History   First MD Initiated Contact with Patient 01/07/14 [phone]     Chief Complaint  Patient presents with  . Flank Pain     (Consider location/radiation/quality/duration/timing/severity/associated sxs/prior Treatment) Patient is a 80 y.o. male presenting with flank pain. The history is provided by the patient and the spouse.  Flank Pain Associated symptoms include abdominal pain. Pertinent negatives include no chest pain, no headaches and no shortness of breath.   onset of left flank pain radiating from groin up into the left flank for 3 days. Patient with extensive history of kidney stones. Her my dictation of kidney stone type pain. Pain described as 8/10. No blood in the urine no nausea no vomiting. No fevers.  Past Medical History  Diagnosis Date  . GERD (gastroesophageal reflux disease)     nisseum fundoplication 6811  . Anemia   . BPH (benign prostatic hyperplasia)   . Kidney stones     History of  . History of urinary frequency   . Urinary urgency   . Hiatal hernia   . History of TMJ syndrome     mild  . Arthritis   . Blood transfusion   . GI bleed 12/12    got 3 units blood   Past Surgical History  Procedure Laterality Date  . Nissen fundoplication  5726  . Shoulder arthroscopy  1954    rt  . Abdominal surgery  1985    tummy tuc  . Colonoscopy    . Cataract extraction      left  . Esophagogastroduodenoscopy  06/12/2011    Procedure: ESOPHAGOGASTRODUODENOSCOPY (EGD);  Surgeon: Scarlette Shorts, MD;  Location: [facility];  Service: Endoscopy;  Laterality: N/A;  . Shoulder arthroscopy  05/28/2011    left  . Orif humerus fracture  12/24/2011    Procedure: OPEN REDUCTION INTERNAL FIXATION (ORIF) PROXIMAL HUMERUS FRACTURE;  Surgeon: Cammie Sickle., MD;  Location: [place];  Service: Orthopedics;  Laterality: Left;  ORIF left humerus  . Hardware removal  02/23/2012    Procedure: HARDWARE  REMOVAL;  Surgeon: Cammie Sickle., MD;  Location: [place];  Service: Orthopedics;  Laterality: Left;  biceps tenolysis and screw removal   Family History  Problem Relation Age of Onset  . Heart defect Father     aortic valve  . Nephrolithiasis Mother   . Lung cancer Brother     smoker  . Colon cancer Neg Hx   . Esophageal cancer Neg Hx   . Stomach cancer Neg Hx   . Rectal cancer Neg Hx    History  Substance Use Topics  . Smoking status: Former Smoker -- 1.00 packs/day for 12 years    Types: Cigarettes    Quit date: 05/26/1966  . Smokeless tobacco: Never Used  . Alcohol Use: 0.6 oz/week    1 Shots of liquor per week     Comment: red wine with dinner    Review of Systems  Constitutional: Negative for fever.  HENT: Negative for congestion.   Eyes: Negative for visual disturbance.  Respiratory: Negative for shortness of breath.   Cardiovascular: Negative for chest pain.  Gastrointestinal: Positive for abdominal pain. Negative for nausea and vomiting.  Genitourinary: Positive for flank pain. Negative for hematuria.  Musculoskeletal: Negative for back pain.  Skin: Negative for rash.  Neurological: Negative for headaches.  Hematological: Does not bruise/bleed easily.  Psychiatric/Behavioral: Negative for confusion.  Allergies  Review of patient's allergies indicates no known allergies.  Home Medications   Prior to Admission medications   Medication Sig Start Date End Date Taking? Authorizing Provider  Calcium Carb-Cholecalciferol (CALCIUM 600 + D) 600-200 MG-UNIT TABS Take 1 tablet by mouth every morning.   Yes Historical Provider, MD  Coenzyme Q10 (COQ10) 200 MG CAPS Take 200 mg by mouth every morning.   Yes Historical Provider, MD  ferrous sulfate 325 (65 FE) MG tablet Take 162.5 mg by mouth daily with breakfast.   Yes Historical Provider, MD  finasteride (PROSCAR) 5 MG tablet Take 5 mg by mouth at bedtime.   Yes Historical Provider, MD   Flaxseed, Linseed, (FLAXSEED OIL) 1000 MG CAPS Take 1,000 mg by mouth every morning.   Yes Historical Provider, MD  Glucosamine-Chondroitin 750-600 MG TABS Take 1 tablet by mouth 2 (two) times daily.   Yes Historical Provider, MD  Methylsulfonylmethane (MSM) 1000 MG CAPS Take 1,000 mg by mouth 2 (two) times daily.   Yes Historical Provider, MD  Multiple Vitamins-Minerals (PRESERVISION AREDS) CAPS Take 1 capsule by mouth 2 (two) times daily.   Yes Historical Provider, MD  OVER THE COUNTER MEDICATION Take 1 capsule by mouth every morning. Beta Sitosterol capsule   Yes Historical Provider, MD  OVER THE COUNTER MEDICATION Take 1 capsule by mouth every morning. "Bladderwrack 163mcg iodine"   Yes Historical Provider, MD  Tamsulosin HCl (FLOMAX) 0.4 MG CAPS Take 0.4 mg by mouth at bedtime.    Yes Historical Provider, MD  TURMERIC PO Take 150 mg by mouth 2 (two) times daily.   Yes Historical Provider, MD  vitamin B-12 (CYANOCOBALAMIN) 1000 MCG tablet Take 1,000 mcg by mouth every morning.   Yes Historical Provider, MD  traMADol (ULTRAM) 50 MG tablet Take 1 tablet (50 mg total) by mouth every 6 (six) hours as needed. 01/07/14   Fredia Sorrow, MD   BP 135/69  Pulse 54  Temp(Src) 97.8 F (36.6 C) (Oral)  Resp 14  SpO2 97% Physical Exam  Nursing note and vitals reviewed. Constitutional: He is oriented to person, place, and time. He appears well-developed and well-nourished. No distress.  HENT:  Head: Normocephalic.  Mouth/Throat: Oropharynx is clear and moist.  Eyes: Conjunctivae and EOM are normal. Pupils are equal, round, and reactive to light.  Neck: Normal range of motion.  Cardiovascular: Normal rate, regular rhythm and normal heart sounds.   Pulmonary/Chest: Effort normal and breath sounds normal.  Abdominal: Soft. Bowel sounds are normal. There is no tenderness.  Musculoskeletal: Normal range of motion. He exhibits no edema.  Neurological: He is alert and oriented to person, place, and  time. No cranial nerve deficit. He exhibits normal muscle tone. Coordination normal.  Skin: Skin is warm. No rash noted.    ED Course  Procedures (including critical care time) Labs Review Labs Reviewed  BASIC METABOLIC PANEL - Abnormal; Notable for the following:    GFR calc non Af Amer 81 (*)    All other components within normal limits  CBC WITH DIFFERENTIAL  URINALYSIS, ROUTINE W REFLEX MICROSCOPIC   Results for orders placed during the hospital encounter of 01/07/14  CBC WITH DIFFERENTIAL      Result Value Ref Range   WBC 10.0  4.0 - 10.5 K/uL   RBC 4.59  4.22 - 5.81 MIL/uL   Hemoglobin 14.7  13.0 - 17.0 g/dL   HCT 42.6  39.0 - 52.0 %   MCV 92.8  78.0 - 100.0 fL  MCH 32.0  26.0 - 34.0 pg   MCHC 34.5  30.0 - 36.0 g/dL   RDW 13.1  11.5 - 15.5 %   Platelets 199  150 - 400 K/uL   Neutrophils Relative % 74  43 - 77 %   Neutro Abs 7.4  1.7 - 7.7 K/uL   Lymphocytes Relative 15  12 - 46 %   Lymphs Abs 1.5  0.7 - 4.0 K/uL   Monocytes Relative 10  3 - 12 %   Monocytes Absolute 1.0  0.1 - 1.0 K/uL   Eosinophils Relative 1  0 - 5 %   Eosinophils Absolute 0.1  0.0 - 0.7 K/uL   Basophils Relative 0  0 - 1 %   Basophils Absolute 0.0  0.0 - 0.1 K/uL  BASIC METABOLIC PANEL      Result Value Ref Range   Sodium 139  137 - 147 mEq/L   Potassium 4.2  3.7 - 5.3 mEq/L   Chloride 101  96 - 112 mEq/L   CO2 24  19 - 32 mEq/L   Glucose, Bld 97  70 - 99 mg/dL   BUN 14  6 - 23 mg/dL   Creatinine, Ser 0.81  0.50 - 1.35 mg/dL   Calcium 9.5  8.4 - 10.5 mg/dL   GFR calc non Af Amer 81 (*) >90 mL/min   GFR calc Af Amer >90  >90 mL/min  URINALYSIS, ROUTINE W REFLEX MICROSCOPIC      Result Value Ref Range   Color, Urine YELLOW  YELLOW   APPearance CLEAR  CLEAR   Specific Gravity, Urine 1.023  1.005 - 1.030   pH 6.0  5.0 - 8.0   Glucose, UA NEGATIVE  NEGATIVE mg/dL   Hgb urine dipstick NEGATIVE  NEGATIVE   Bilirubin Urine NEGATIVE  NEGATIVE   Ketones, ur NEGATIVE  NEGATIVE mg/dL   Protein,  ur NEGATIVE  NEGATIVE mg/dL   Urobilinogen, UA 1.0  0.0 - 1.0 mg/dL   Nitrite NEGATIVE  NEGATIVE   Leukocytes, UA NEGATIVE  NEGATIVE     Imaging Review Ct Abdomen Pelvis Wo Contrast  01/07/2014   CLINICAL DATA:  Left flank pain  EXAM: CT ABDOMEN AND PELVIS WITHOUT CONTRAST  TECHNIQUE: Multidetector CT imaging of the abdomen and pelvis was performed following the standard protocol without IV contrast.  COMPARISON:  07/31/2007  FINDINGS: Lung bases are unremarkable. There are postsurgical changes lumbar spine with posterior fixation material at L2-L3 level. Diffuse osteopenia. Mild degenerative changes thoracic spine. Degenerative changes with disc space flattening and vacuum disc phenomenon lower lumbar spine.  Heart size within normal limits. Postsurgical changes are noted GE junction region. Unenhanced liver shows no biliary ductal dilatation. Atherosclerotic calcifications of abdominal aorta and iliac arteries. No aortic aneurysm. No calcified gallstones are noted within gallbladder.  Atrophic partially fatty replaced pancreas. The spleen and adrenal glands are unremarkable. Unenhanced kidneys are symmetrical in size. No nephrolithiasis. No hydronephrosis or hydroureter.  No calcified ureteral calculi are noted bilaterally. Moderate stool noted in transverse colon and right colon. No pericecal inflammation. The terminal ileum is unremarkable.  The urinary bladder is unremarkable. Prostate gland measures 4.5 x 3.4 cm. No small bowel obstruction. No ascites or free air. No adenopathy.  There is small left hydrocele measures about 3 cm. There is a small right inguinal scrotal canal hernia containing fat measures about 2.4 cm without evidence of acute complication best seen in sagittal image 56. Small left inguinal scrotal canal hernia containing fat in  axial image 78 and sagittal image 79 measures about 3 cm. No evidence of acute complication.  IMPRESSION: 1. No nephrolithiasis.  No hydronephrosis or  hydroureter. 2. No calcified ureteral calculi. 3. Degenerative changes lumbar spine. Postsurgical changes at L2-L3 level. 4. A left hydrocele is noted measures 3 cm. Small bilateral inguinal scrotal canal hernia containing fat without evidence of acute complication.   Electronically Signed   By: Lahoma Crocker M.D.   On: 01/07/2014 11:37     EKG Interpretation None      MDM   Final diagnoses:  Flank pain    Patient with acute onset of some left flank pain has been going on for about 3 days. My dictation of prior kidney stones. It radiated from groin up into the left flank area. Workup here today without any evidence of a stone. Urinalysis negative for hematuria or infection. No significant leukocytosis. Patient's renal function is normal. CT scan made no mention of any significant inflammatory changes in the sigmoid colon to suggest diverticulitis. There was no evidence of any ureteral stones. Patient given super cautions. We treated with tramadol feels much better now. Patient has urology for followup and has primary care Dr. for followup. Patient knows of the abdomen was not completely evaluated and that some subtle changes in the abdomen could have been overlooked due to the fact the study was done with out contrast because it was done to look for stone.    Fredia Sorrow, MD 01/07/14 1235

## 2014-07-24 ENCOUNTER — Other Ambulatory Visit (INDEPENDENT_AMBULATORY_CARE_PROVIDER_SITE_OTHER): Payer: Medicare Other

## 2014-07-24 ENCOUNTER — Encounter: Payer: Self-pay | Admitting: *Deleted

## 2014-07-24 ENCOUNTER — Ambulatory Visit (INDEPENDENT_AMBULATORY_CARE_PROVIDER_SITE_OTHER): Payer: Medicare Other | Admitting: Internal Medicine

## 2014-07-24 VITALS — BP 118/68 | HR 82 | Ht 72.0 in | Wt 212.3 lb

## 2014-07-24 DIAGNOSIS — R1013 Epigastric pain: Secondary | ICD-10-CM | POA: Diagnosis not present

## 2014-07-24 DIAGNOSIS — K5901 Slow transit constipation: Secondary | ICD-10-CM

## 2014-07-24 DIAGNOSIS — D649 Anemia, unspecified: Secondary | ICD-10-CM | POA: Diagnosis not present

## 2014-07-24 LAB — CBC WITH DIFFERENTIAL/PLATELET
BASOS ABS: 0.1 10*3/uL (ref 0.0–0.1)
BASOS PCT: 0.6 % (ref 0.0–3.0)
Eosinophils Absolute: 0.2 10*3/uL (ref 0.0–0.7)
Eosinophils Relative: 2.2 % (ref 0.0–5.0)
HEMATOCRIT: 43.7 % (ref 39.0–52.0)
Hemoglobin: 14.8 g/dL (ref 13.0–17.0)
LYMPHS PCT: 21.1 % (ref 12.0–46.0)
Lymphs Abs: 2.2 10*3/uL (ref 0.7–4.0)
MCHC: 33.8 g/dL (ref 30.0–36.0)
MCV: 95.2 fl (ref 78.0–100.0)
MONO ABS: 1.1 10*3/uL — AB (ref 0.1–1.0)
Monocytes Relative: 10.4 % (ref 3.0–12.0)
NEUTROS ABS: 7 10*3/uL (ref 1.4–7.7)
Neutrophils Relative %: 65.7 % (ref 43.0–77.0)
Platelets: 238 10*3/uL (ref 150.0–400.0)
RBC: 4.59 Mil/uL (ref 4.22–5.81)
RDW: 13.6 % (ref 11.5–15.5)
WBC: 10.6 10*3/uL — ABNORMAL HIGH (ref 4.0–10.5)

## 2014-07-24 LAB — COMPREHENSIVE METABOLIC PANEL
ALT: 13 U/L (ref 0–53)
AST: 13 U/L (ref 0–37)
Albumin: 4 g/dL (ref 3.5–5.2)
Alkaline Phosphatase: 45 U/L (ref 39–117)
BUN: 19 mg/dL (ref 6–23)
CALCIUM: 9.3 mg/dL (ref 8.4–10.5)
CHLORIDE: 102 meq/L (ref 96–112)
CO2: 28 mEq/L (ref 19–32)
Creatinine, Ser: 0.9 mg/dL (ref 0.4–1.5)
GFR: 81.45 mL/min (ref >60.00)
Glucose, Bld: 87 mg/dL (ref 70–99)
Potassium: 4.7 mEq/L (ref 3.5–5.1)
SODIUM: 135 meq/L (ref 135–145)
TOTAL PROTEIN: 6.9 g/dL (ref 6.0–8.3)
Total Bilirubin: 0.6 mg/dL (ref 0.2–1.2)

## 2014-07-24 LAB — FERRITIN: Ferritin: 67.9 ng/mL (ref 22.0–322.0)

## 2014-07-24 LAB — IBC PANEL
Iron: 97 ug/dL (ref 42–165)
Saturation Ratios: 26.5 % (ref 20.0–50.0)
Transferrin: 261.6 mg/dL (ref 212.0–360.0)

## 2014-07-24 MED ORDER — RANITIDINE HCL 150 MG PO TABS: 150.0000 mg | ORAL_TABLET | Freq: Every evening | ORAL | Status: DC

## 2014-07-24 NOTE — Patient Instructions (Signed)
Your physician has requested that you go to the basement for the following lab work before leaving today: CBC, CMET, IBC, Ferritin  We have sent the following medications to your pharmacy for you to pick up at your convenience: Ranitidine 150mg  1 tab in evening.  Please follow up with Dr. Hilarie Fredrickson in 1 year.  Cc. London Pepper

## 2014-07-24 NOTE — Progress Notes (Signed)
Patient ID: Lucas Shepherd, male   DOB: May 16, 1929, 81 y.o.   MRN: [id number] HPI: Lucas Shepherd is an 81 year old male known previously to Lucas Shepherd who is seen for follow-up. He was last seen in the office in May 2014. At that time he was having acute constipation and was scheduled for colonoscopy. Colonoscopy was performed shortly after that visit on 11/23/2012 and was normal. He had external hemorrhoids. No polyps are seen. It was felt that he likely had slow transit constipation. He has a history of GERD and hiatal hernia status post Nissen fundoplication. He also has a history of an upper GI bleed secondary to a gastric Dieulafoy lesion treated with endoclip in 2013.  He reports overall he is doing well. He reports bowel movements have been fairly regular though at times he can have constipation. He feels that his diet related. He is not using laxatives. He reports a good appetite without nausea or vomiting. No dysphagia or odynophagia. No blood in his stool or melena. He has noticed several months of epigastric nocturnal discomfort. He reports he uses a pillow to apply pressure to his epigastrium which seems to help with the discomfort. He has noticed more upper GI gas and belching over the last few months. It has not resulted in early satiety or weight loss. He's also noticed some mild reflux which is the first time he's had either reflux or belching since Nissen fundoplication. He drinks one glass of red wine on most nights only for the past 5 years, prior to this no alcohol use. He avoid alcohol for the last 2 nights and also ate a very soft diet and last night had less trouble than normal. No hepatobiliary complaint.  Past Medical History  Diagnosis Date  . GERD (gastroesophageal reflux disease)     nisseum fundoplication 5427  . Anemia   . BPH (benign prostatic hyperplasia)   . Kidney stones     History of  . History of urinary frequency   . Urinary urgency   . Hiatal hernia   .  History of TMJ syndrome     mild  . Arthritis   . Blood transfusion   . GI bleed 12/12    got 3 units blood  . External hemorrhoids     Past Surgical History  Procedure Laterality Date  . Nissen fundoplication  0623  . Shoulder arthroscopy  1954    rt  . Abdominal surgery  1985    tummy tuc  . Colonoscopy    . Cataract extraction      left  . Esophagogastroduodenoscopy  06/12/2011    Procedure: ESOPHAGOGASTRODUODENOSCOPY (EGD);  Surgeon: Scarlette Shorts, MD;  Location: [facility];  Service: Endoscopy;  Laterality: N/A;  . Shoulder arthroscopy  05/28/2011    left  . Orif humerus fracture  12/24/2011    Procedure: OPEN REDUCTION INTERNAL FIXATION (ORIF) PROXIMAL HUMERUS FRACTURE;  Surgeon: Cammie Sickle., MD;  Location: [place];  Service: Orthopedics;  Laterality: Left;  ORIF left humerus  . Hardware removal  02/23/2012    Procedure: HARDWARE REMOVAL;  Surgeon: Cammie Sickle., MD;  Location: [place];  Service: Orthopedics;  Laterality: Left;  biceps tenolysis and screw removal  . Spine fussion      Outpatient Prescriptions Prior to Visit  Medication Sig Dispense Refill  . Calcium Carb-Cholecalciferol (CALCIUM 600 + D) 600-200 MG-UNIT TABS Take 1 tablet by mouth every morning.    . Coenzyme Q10 (  COQ10) 200 MG CAPS Take 200 mg by mouth every morning.    . ferrous sulfate 325 (65 FE) MG tablet Take 162.5 mg by mouth daily with breakfast.    . finasteride (PROSCAR) 5 MG tablet Take 5 mg by mouth at bedtime.    . Flaxseed, Linseed, (FLAXSEED OIL) 1000 MG CAPS Take 1,000 mg by mouth every morning.    . Methylsulfonylmethane (MSM) 1000 MG CAPS Take 1,000 mg by mouth 2 (two) times daily.    . Multiple Vitamins-Minerals (PRESERVISION AREDS) CAPS Take 1 capsule by mouth 2 (two) times daily.    Marland Kitchen OVER THE COUNTER MEDICATION Take 1 capsule by mouth every morning. Beta Sitosterol capsule    . OVER THE COUNTER MEDICATION Take 1 capsule by mouth every  morning. "Bladderwrack 146mcg iodine"    . TURMERIC PO Take 150 mg by mouth 2 (two) times daily.    . vitamin B-12 (CYANOCOBALAMIN) 1000 MCG tablet Take 1,000 mcg by mouth every morning.    . Glucosamine-Chondroitin 750-600 MG TABS Take 1 tablet by mouth 2 (two) times daily.    . Tamsulosin HCl (FLOMAX) 0.4 MG CAPS Take 0.4 mg by mouth at bedtime.     . traMADol (ULTRAM) 50 MG tablet Take 1 tablet (50 mg total) by mouth every 6 (six) hours as needed. 20 tablet 0   No facility-administered medications prior to visit.    No Known Allergies  Family History  Problem Relation Age of Onset  . Heart defect Father     aortic valve  . Nephrolithiasis Mother   . Lung cancer Brother     smoker  . Colon cancer Neg Hx   . Esophageal cancer Neg Hx   . Stomach cancer Neg Hx   . Rectal cancer Neg Hx     History  Substance Use Topics  . Smoking status: Former Smoker -- 1.00 packs/day for 12 years    Types: Cigarettes    Quit date: 05/26/1966  . Smokeless tobacco: Never Used  . Alcohol Use: 0.6 oz/week    1 Shots of liquor per week     Comment: red wine with dinner    ROS: As per history of present illness, otherwise negative  BP 118/68 mmHg  Pulse 82  Ht 6' (1.829 m)  Wt 212 lb 4.8 oz (96.299 kg)  BMI 28.79 kg/m2  SpO2 97% Constitutional: Well-developed and well-nourished. No distress. HEENT: Normocephalic and atraumatic. Oropharynx is clear and moist. No oropharyngeal exudate. Conjunctivae are normal.  No scleral icterus. Neck: Neck supple. Trachea midline. Cardiovascular: Normal rate, regular rhythm and intact distal pulses. No M/R/G Pulmonary/chest: Effort normal and breath sounds normal. No wheezing, rales or rhonchi. Abdominal: Soft, nontender, nondistended. Bowel sounds active throughout. There are no masses palpable. No hepatosplenomegaly. Extremities: no clubbing, cyanosis, or edema Lymphadenopathy: No cervical adenopathy noted. Neurological: Alert and oriented to person  place and time. Skin: Skin is warm and dry. No rashes noted. Psychiatric: Normal mood and affect. Behavior is normal.  RELEVANT LABS AND IMAGING: CBC    Component Value Date/Time   WBC 10.6* 07/24/2014 1438   RBC 4.59 07/24/2014 1438   HGB 14.8 07/24/2014 1438   HCT 43.7 07/24/2014 1438   PLT 238.0 07/24/2014 1438   MCV 95.2 07/24/2014 1438   MCH 32.0 01/07/2014 1035   MCHC 33.8 07/24/2014 1438   RDW 13.6 07/24/2014 1438   LYMPHSABS 2.2 07/24/2014 1438   MONOABS 1.1* 07/24/2014 1438   EOSABS 0.2 07/24/2014 1438   BASOSABS  0.1 07/24/2014 1438    CMP     Component Value Date/Time   NA 135 07/24/2014 1438   K 4.7 07/24/2014 1438   CL 102 07/24/2014 1438   CO2 28 07/24/2014 1438   GLUCOSE 87 07/24/2014 1438   BUN 19 07/24/2014 1438   CREATININE 0.9 07/24/2014 1438   CALCIUM 9.3 07/24/2014 1438   PROT 6.9 07/24/2014 1438   ALBUMIN 4.0 07/24/2014 1438   AST 13 07/24/2014 1438   ALT 13 07/24/2014 1438   ALKPHOS 45 07/24/2014 1438   BILITOT 0.6 07/24/2014 1438   GFRNONAA 81* 01/07/2014 1035   GFRAA >90 01/07/2014 1035    ASSESSMENT/PLAN:  81 year old male known previously to Lucas Shepherd who is seen for follow-up.  1. Epigastric pain/history of GERD status post Nissen fundoplication -- his symptoms could be dyspeptic or even gallbladder related. I have recommended a trial of ranitidine 150 mg each evening. If no benefit would proceed to daily PPI. If no benefit after that I would pursue abdominal imaging to evaluate gallbladder and also upper endoscopy. He is comfortable with this plan. CBC and iron studies performed today, unremarkable. Iron studies pending  2. Constipation -- no longer a major issue. He is taking oral iron one half tablet daily, see #2. Diet-controlled constipation   3. Iron replacement -- it seems that he has been taking oral iron for several years since upper GI bleed. No recent bleeding. Iron can certainly worsen constipation. Check iron studies today  and if normal, discontinue oral iron.  Follow-up in one year, though sooner if epigastric pain is not completely resolved. I asked that he notify my office if pain is not completely resolved and he voices understanding.

## 2014-08-15 ENCOUNTER — Encounter: Payer: Self-pay | Admitting: Internal Medicine

## 2014-08-15 ENCOUNTER — Other Ambulatory Visit: Payer: Self-pay

## 2014-08-15 MED ORDER — ZANTAC 150 MG PO TABS: 150.0000 mg | ORAL_TABLET | Freq: Every evening | ORAL | Status: DC

## 2014-10-22 ENCOUNTER — Encounter: Payer: Self-pay | Admitting: Internal Medicine

## 2014-10-22 NOTE — Telephone Encounter (Signed)
Technically all NSAIDs can result in gastric inflammation/gastritis Tylenol does not have GI toxicity and can be used per box instruction for pain, but is not an anti-inflammatory medication He would likely be just fine from a GI standpoint for a short course of an anti-inflammatory medicine such as ibuprofen or Aleve per box instruction Would continue ranitidine as prescribed at last office visit Hopefully this helps answer his question

## 2015-01-08 ENCOUNTER — Other Ambulatory Visit: Payer: Self-pay | Admitting: Family Medicine

## 2015-01-08 ENCOUNTER — Ambulatory Visit
Admission: RE | Admit: 2015-01-08 | Discharge: 2015-01-08 | Disposition: A | Discharge status: Home / Self Care | Payer: Medicare Other | Source: Ambulatory Visit | Attending: Family Medicine | Admitting: Family Medicine

## 2015-01-08 DIAGNOSIS — M7989 Other specified soft tissue disorders: Secondary | ICD-10-CM

## 2015-01-08 DIAGNOSIS — S161XXA Strain of muscle, fascia and tendon at neck level, initial encounter: Secondary | ICD-10-CM

## 2015-01-08 IMAGING — CR DG TOE GREAT 2+V*R*
3 series · 3 of 3 positions shown · non-contrast
Comparison: None

CLINICAL DATA: Tripped and fell last night with pain and swelling
of the right great toe

EXAM:
RIGHT GREAT TOE

[t toes ap right]
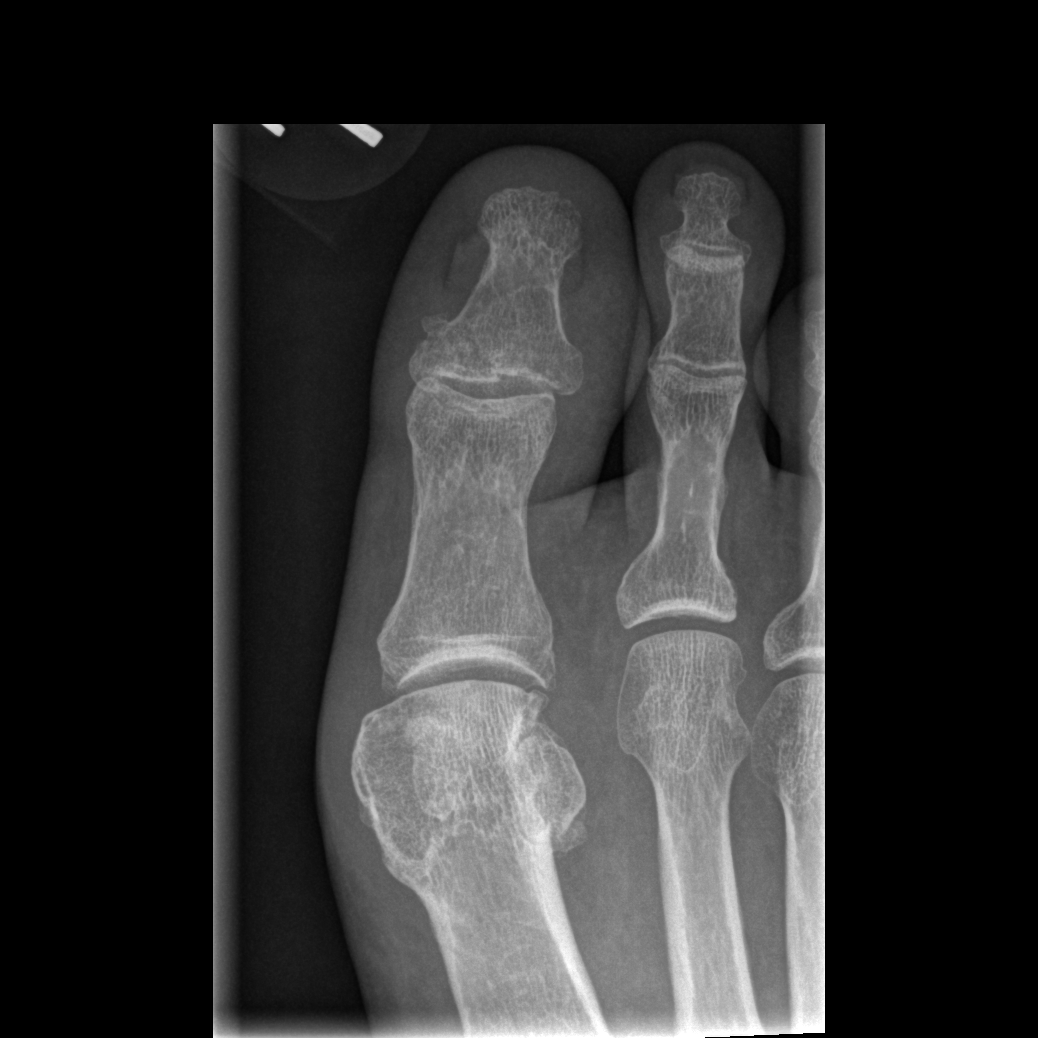

[t toes oblique right]
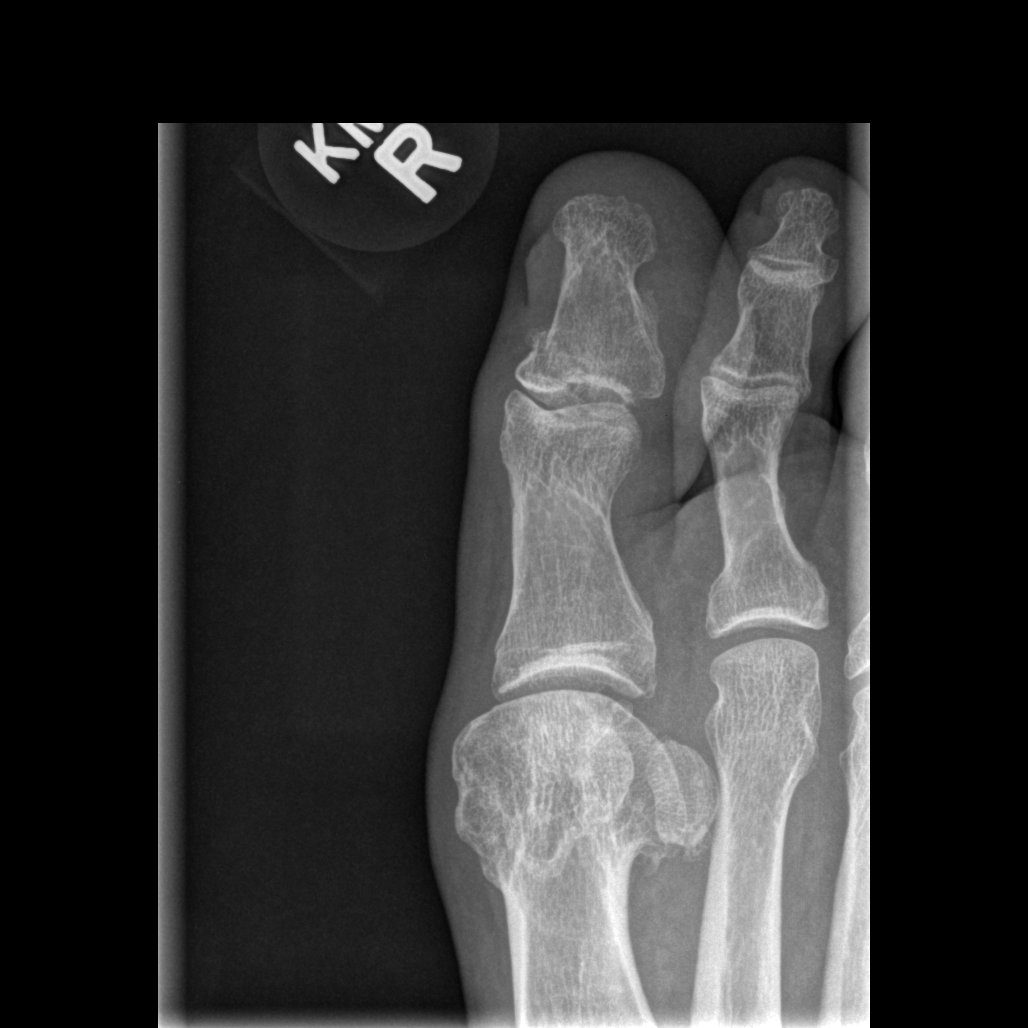

[t toes lateral right]
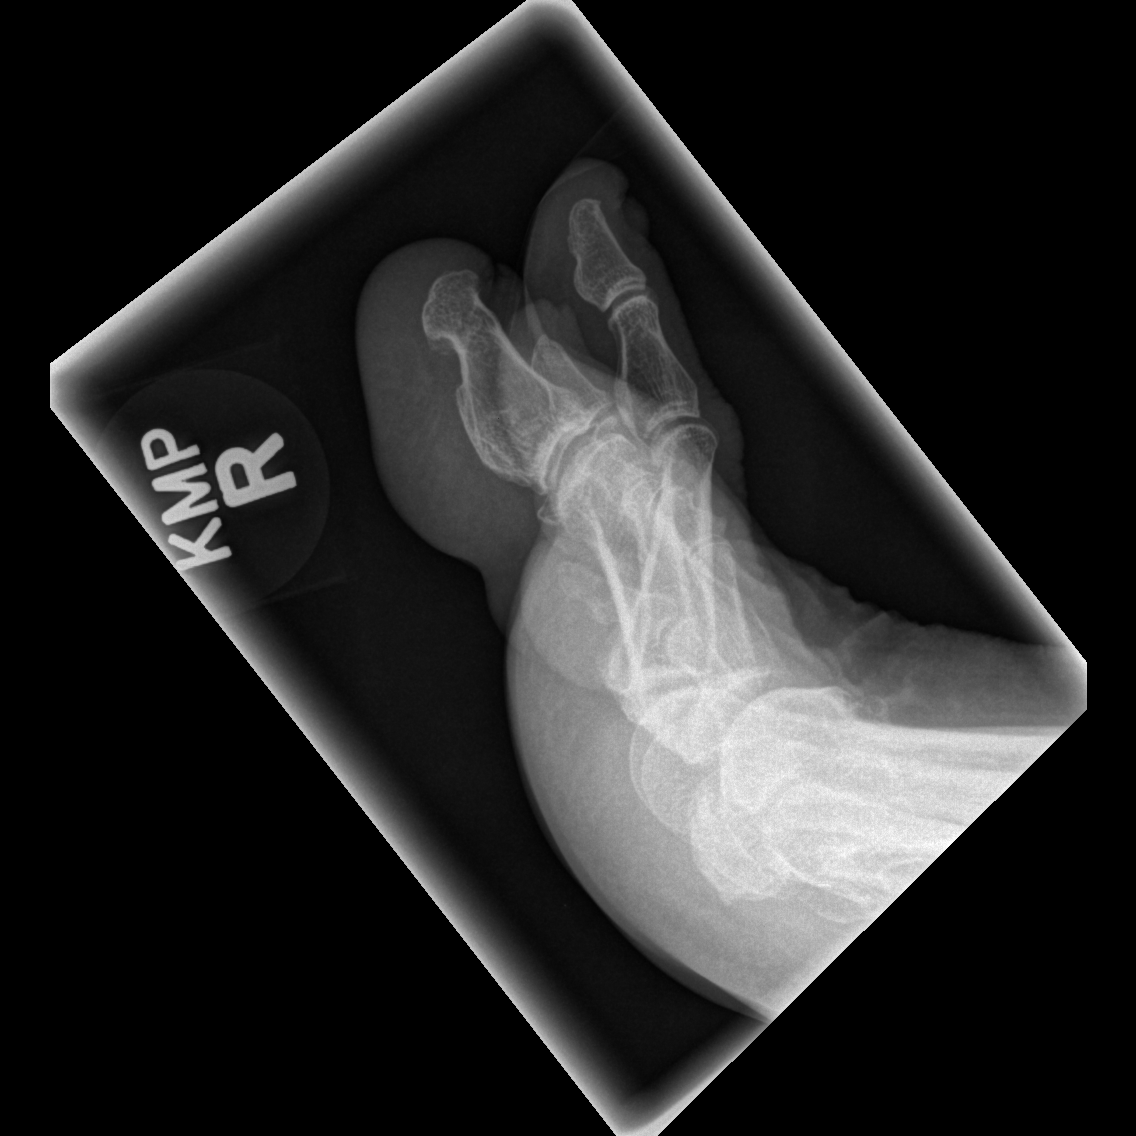

[3 of 3 positions shown; findings below may reference images not displayed]

FINDINGS: There is nondisplaced fracture through the base of the distal
phalanx of the right great toe medially. This fracture does involve
the right first DIP joint. No other acute abnormality is seen.
IMPRESSION: Fracture of the base of the distal phalanx of the right great toe
extending intra-articular.

## 2015-01-08 IMAGING — CR DG CERVICAL SPINE 2 OR 3 VIEWS
5 series · 5 of 5 positions shown · non-contrast
Comparison: None.

CLINICAL DATA: Pain following fall 1 day prior

EXAM:
CERVICAL SPINE - 2-3 VIEW

[w c-spine lat]
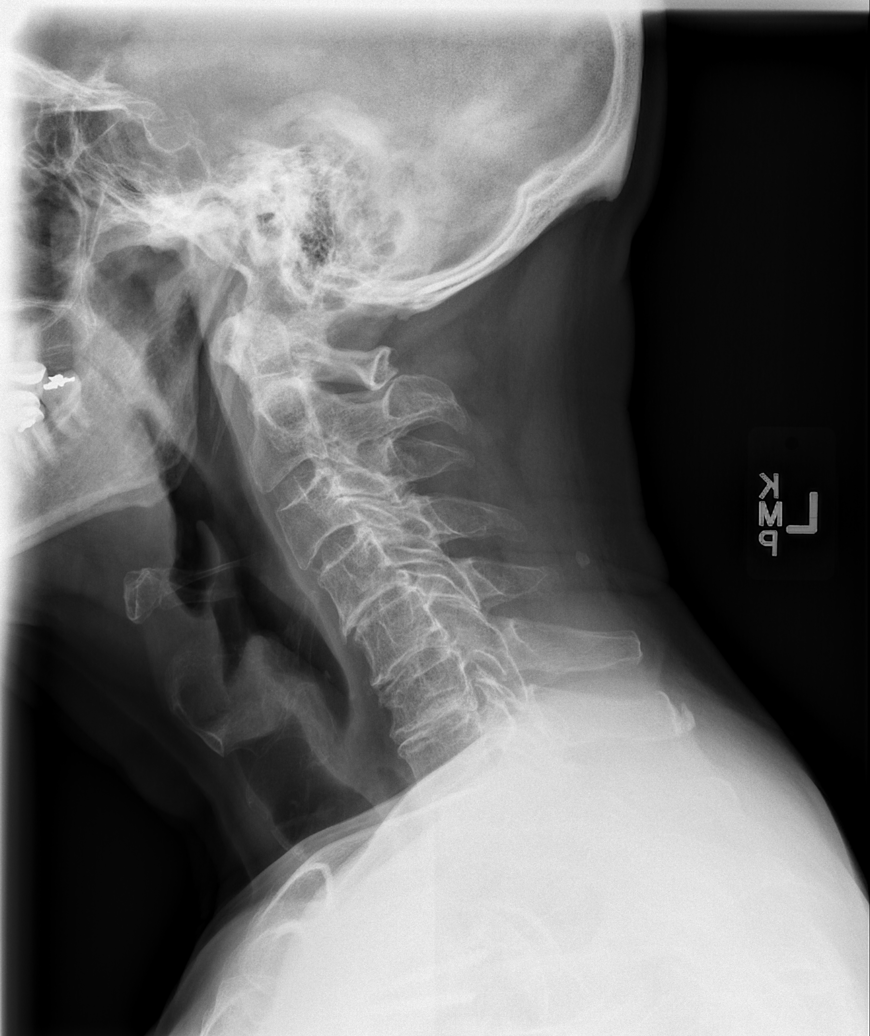

[w c-spine a.p. *]
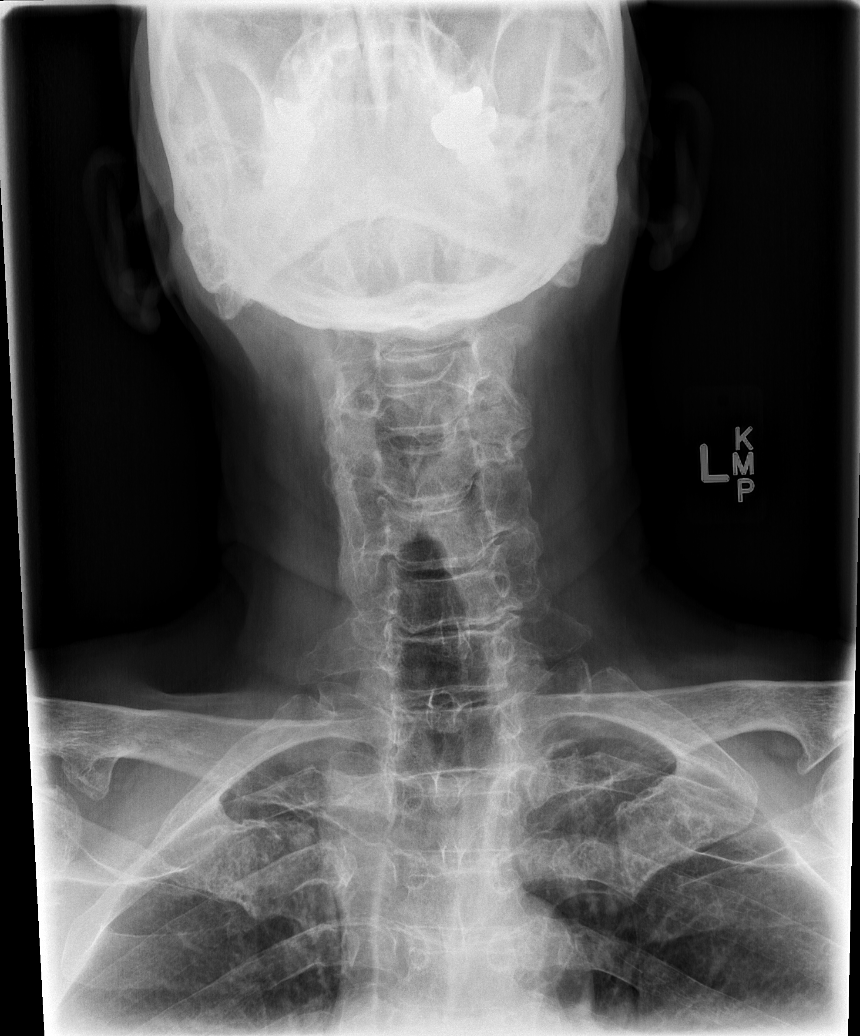

[w c-spine odontoid *]
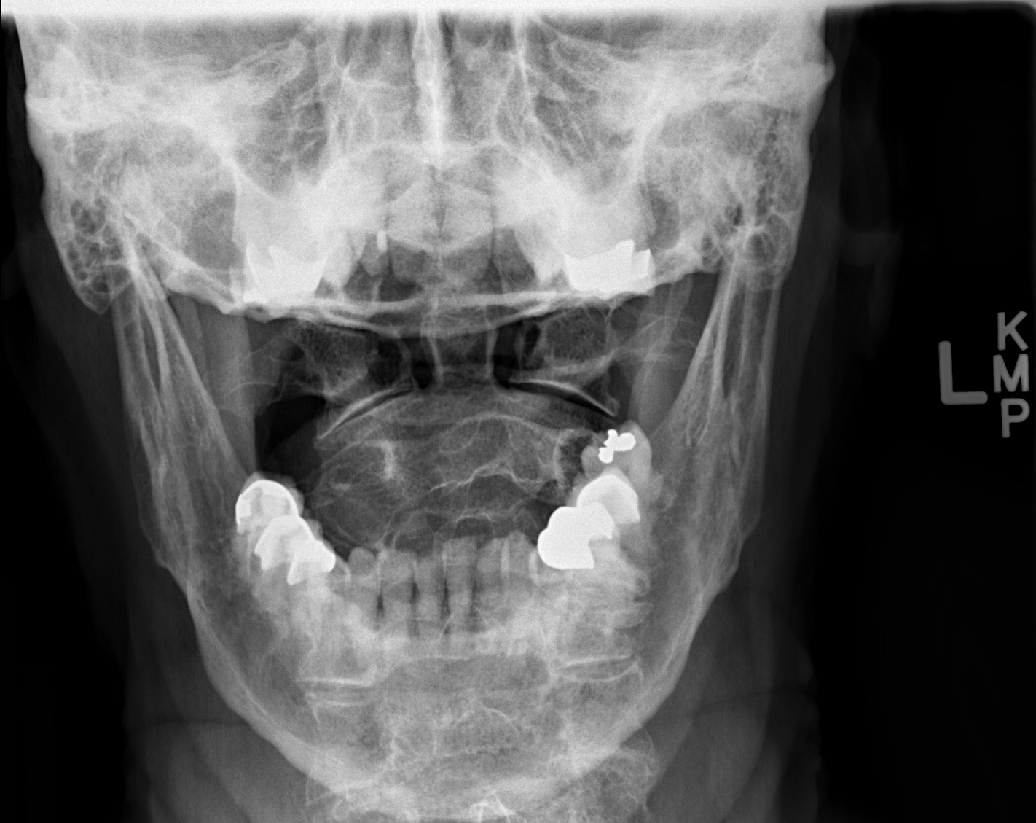

[w swimmers view * (1 of 2)]
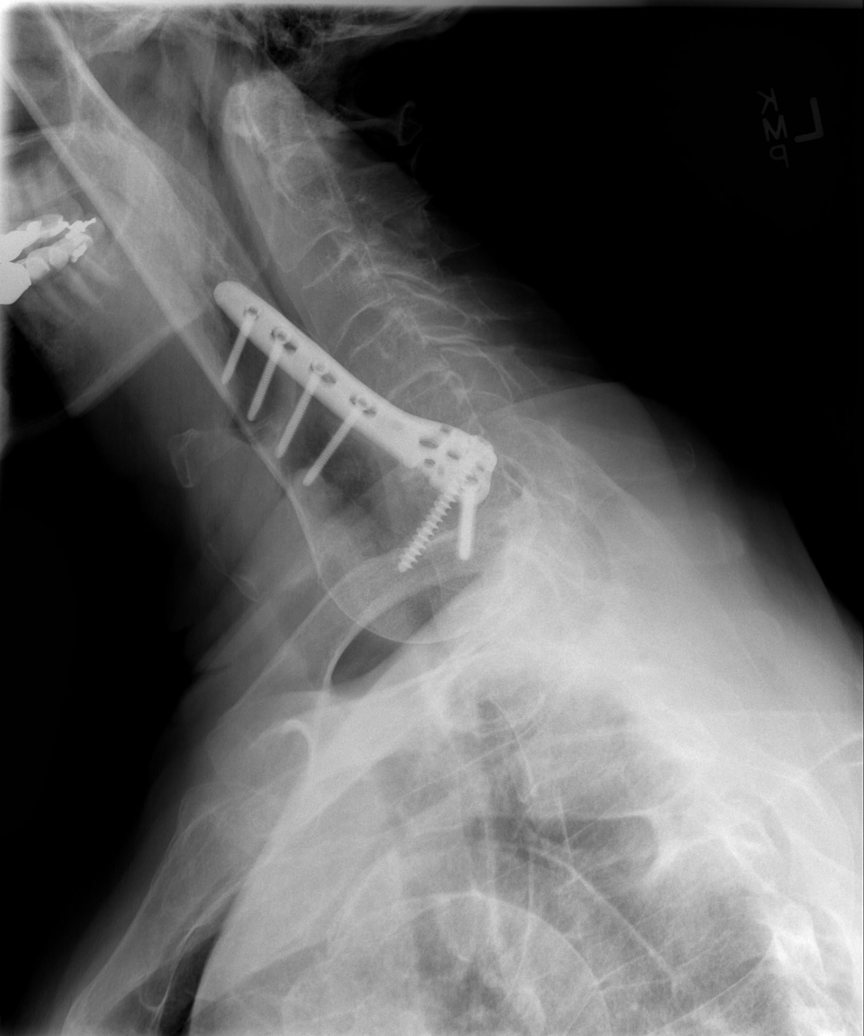

[w swimmers view * (2 of 2)]
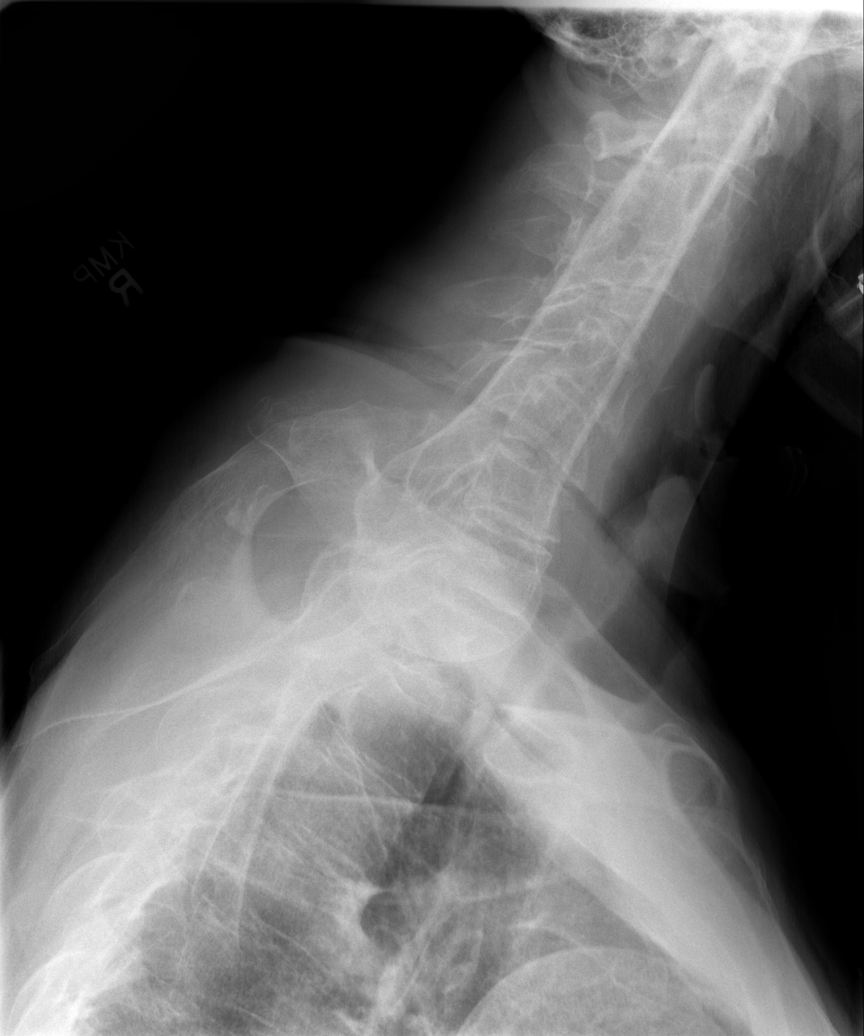

[5 of 5 positions shown; findings below may reference images not displayed]

FINDINGS: Frontal, lateral, and open-mouth odontoid images were obtained. Is
no fracture or spondylolisthesis. Prevertebral soft tissues and
predental space regions normal. There is moderately severe disc
space narrowing at C4-5, C5-6, and C6-7. There is moderate narrowing
at C7-T1. There is no erosive change. There is reversal of lordotic
curvature.
IMPRESSION: Multilevel osteoarthritic change. No fracture or spondylolisthesis.
Reversal of lordotic curvature is probably due to muscle spasm. If
there is concern for ligamentous injury, however, lateral
flexion-extension views could be helpful to further assess.

## 2015-04-15 ENCOUNTER — Emergency Department (HOSPITAL_COMMUNITY)
Admission: EM | Admit: 2015-04-15 | Discharge: 2015-04-15 | Disposition: A | Discharge status: Home / Self Care | Payer: Medicare Other | Attending: Emergency Medicine | Admitting: Emergency Medicine

## 2015-04-15 ENCOUNTER — Emergency Department (HOSPITAL_COMMUNITY): Payer: Medicare Other

## 2015-04-15 ENCOUNTER — Encounter (HOSPITAL_COMMUNITY): Payer: Self-pay | Admitting: *Deleted

## 2015-04-15 DIAGNOSIS — Z79899 Other long term (current) drug therapy: Secondary | ICD-10-CM | POA: Insufficient documentation

## 2015-04-15 DIAGNOSIS — R51 Headache: Secondary | ICD-10-CM | POA: Diagnosis present

## 2015-04-15 DIAGNOSIS — R531 Weakness: Secondary | ICD-10-CM | POA: Diagnosis not present

## 2015-04-15 DIAGNOSIS — R358 Other polyuria: Secondary | ICD-10-CM | POA: Insufficient documentation

## 2015-04-15 DIAGNOSIS — R42 Dizziness and giddiness: Secondary | ICD-10-CM | POA: Diagnosis not present

## 2015-04-15 DIAGNOSIS — Z87438 Personal history of other diseases of male genital organs: Secondary | ICD-10-CM | POA: Diagnosis not present

## 2015-04-15 DIAGNOSIS — D649 Anemia, unspecified: Secondary | ICD-10-CM | POA: Diagnosis not present

## 2015-04-15 DIAGNOSIS — Z87442 Personal history of urinary calculi: Secondary | ICD-10-CM | POA: Diagnosis not present

## 2015-04-15 DIAGNOSIS — Z87891 Personal history of nicotine dependence: Secondary | ICD-10-CM | POA: Diagnosis not present

## 2015-04-15 DIAGNOSIS — K219 Gastro-esophageal reflux disease without esophagitis: Secondary | ICD-10-CM | POA: Insufficient documentation

## 2015-04-15 DIAGNOSIS — I10 Essential (primary) hypertension: Secondary | ICD-10-CM | POA: Insufficient documentation

## 2015-04-15 DIAGNOSIS — M542 Cervicalgia: Secondary | ICD-10-CM | POA: Insufficient documentation

## 2015-04-15 DIAGNOSIS — R350 Frequency of micturition: Secondary | ICD-10-CM | POA: Diagnosis not present

## 2015-04-15 LAB — DIFFERENTIAL
BASOS ABS: 0.1 10*3/uL (ref 0.0–0.1)
Basophils Relative: 1 %
EOS ABS: 0.1 10*3/uL (ref 0.0–0.7)
Eosinophils Relative: 1 %
LYMPHS ABS: 2.2 10*3/uL (ref 0.7–4.0)
LYMPHS PCT: 23 %
MONOS PCT: 10 %
Monocytes Absolute: 0.9 10*3/uL (ref 0.1–1.0)
Neutro Abs: 6.3 10*3/uL (ref 1.7–7.7)
Neutrophils Relative %: 65 %

## 2015-04-15 LAB — COMPREHENSIVE METABOLIC PANEL
ALK PHOS: 48 U/L (ref 38–126)
ALT: 15 U/L — AB (ref 17–63)
AST: 21 U/L (ref 15–41)
Albumin: 4.1 g/dL (ref 3.5–5.0)
Anion gap: 9 (ref 5–15)
BILIRUBIN TOTAL: 0.7 mg/dL (ref 0.3–1.2)
BUN: 16 mg/dL (ref 6–20)
CALCIUM: 9.9 mg/dL (ref 8.9–10.3)
CO2: 26 mmol/L (ref 22–32)
CREATININE: 1.02 mg/dL (ref 0.61–1.24)
Chloride: 103 mmol/L (ref 101–111)
GFR calc Af Amer: >60 mL/min (ref >60)
GLUCOSE: 99 mg/dL (ref 65–99)
POTASSIUM: 5.1 mmol/L (ref 3.5–5.1)
Sodium: 138 mmol/L (ref 135–145)
TOTAL PROTEIN: 7.5 g/dL (ref 6.5–8.1)

## 2015-04-15 LAB — CBC
HCT: 46.5 % (ref 39.0–52.0)
HEMOGLOBIN: 15.5 g/dL (ref 13.0–17.0)
MCH: 31.5 pg (ref 26.0–34.0)
MCHC: 33.3 g/dL (ref 30.0–36.0)
MCV: 94.5 fL (ref 78.0–100.0)
Platelets: 244 10*3/uL (ref 150–400)
RBC: 4.92 MIL/uL (ref 4.22–5.81)
RDW: 13.3 % (ref 11.5–15.5)
WBC: 9.5 10*3/uL (ref 4.0–10.5)

## 2015-04-15 LAB — URINALYSIS, ROUTINE W REFLEX MICROSCOPIC
BILIRUBIN URINE: NEGATIVE
GLUCOSE, UA: NEGATIVE mg/dL
Hgb urine dipstick: NEGATIVE
KETONES UR: NEGATIVE mg/dL
Nitrite: NEGATIVE
PH: 6.5 (ref 5.0–8.0)
Protein, ur: NEGATIVE mg/dL
SPECIFIC GRAVITY, URINE: 1.006 (ref 1.005–1.030)
Urobilinogen, UA: 0.2 mg/dL (ref 0.0–1.0)

## 2015-04-15 LAB — I-STAT CHEM 8, ED
BUN: 19 mg/dL (ref 6–20)
CREATININE: 1 mg/dL (ref 0.61–1.24)
Calcium, Ion: 1.21 mmol/L (ref 1.13–1.30)
Chloride: 103 mmol/L (ref 101–111)
GLUCOSE: 94 mg/dL (ref 65–99)
HCT: 49 % (ref 39.0–52.0)
HEMOGLOBIN: 16.7 g/dL (ref 13.0–17.0)
Potassium: 4.9 mmol/L (ref 3.5–5.1)
Sodium: 139 mmol/L (ref 135–145)
TCO2: 26 mmol/L (ref 0–100)

## 2015-04-15 LAB — URINE MICROSCOPIC-ADD ON

## 2015-04-15 LAB — APTT: APTT: 28 s (ref 24–37)

## 2015-04-15 LAB — PROTIME-INR
INR: 1.08 (ref 0.00–1.49)
Prothrombin Time: 14.2 seconds (ref 11.6–15.2)

## 2015-04-15 LAB — I-STAT TROPONIN, ED: TROPONIN I, POC: 0 ng/mL (ref 0.00–0.08)

## 2015-04-15 IMAGING — CT CT HEAD W/O CM
2 series · 16 of 30 positions shown, 20 images · non-contrast
Comparison: [DATE].

CLINICAL DATA: Intermittent hypertension yesterday with dizziness
and headache. Similar symptoms this morning.

EXAM:
CT HEAD WITHOUT CONTRAST
TECHNIQUE: Contiguous axial images were obtained from the base of the skull
through the vertex without intravenous contrast.

[Series 201: head w/o, idose (1) · axial · non-contrast · 0.45mm/px · z∈[+75,+195]mm · 13 of 30 slices shown, 17 images]
[im 3/30  brain]
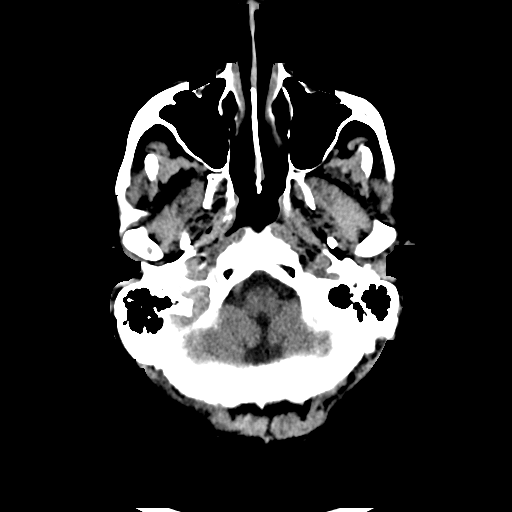
[im 3/30  bone]
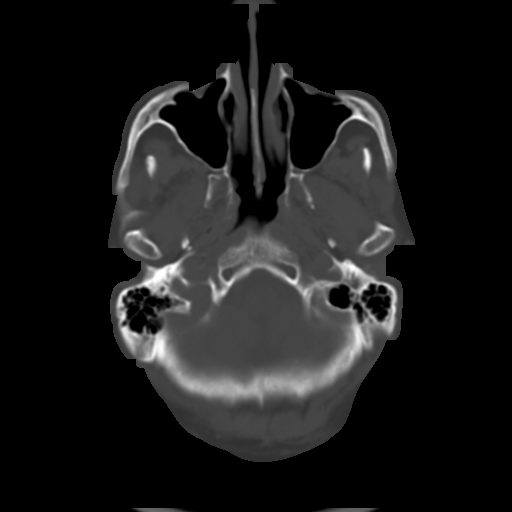
[im 5/30  brain]
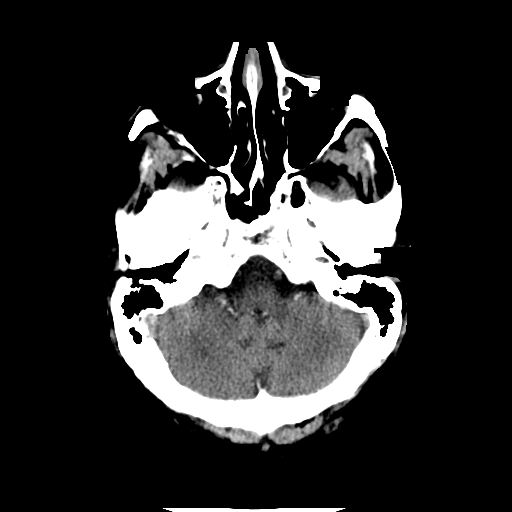
[im 7/30  brain]
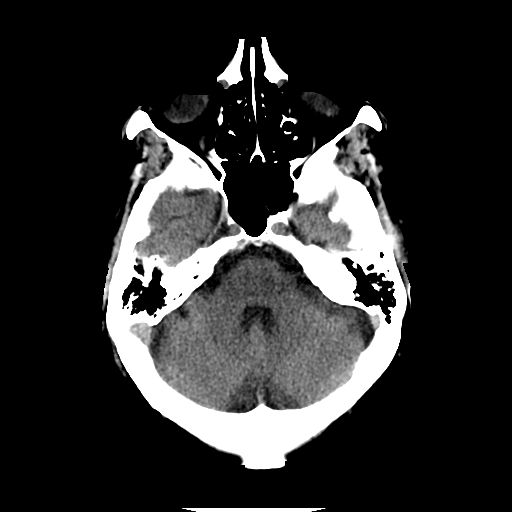
[im 9/30  brain]
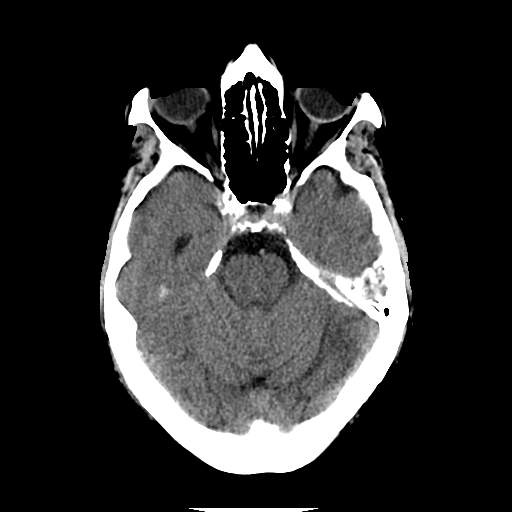
[im 11/30  brain]
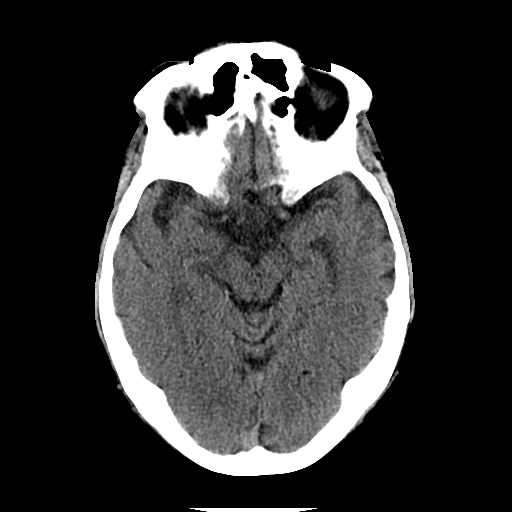
[im 11/30  bone]
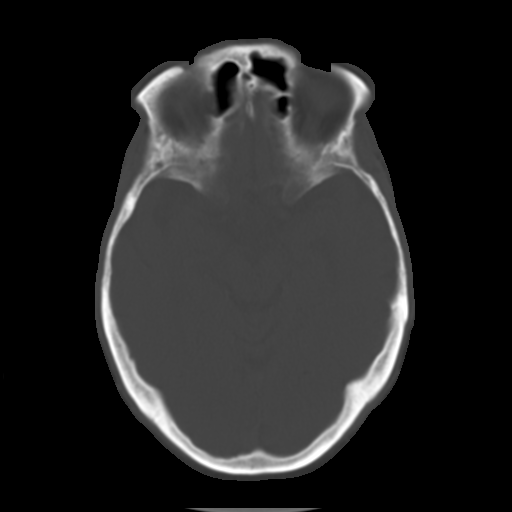
[im 13/30  brain]
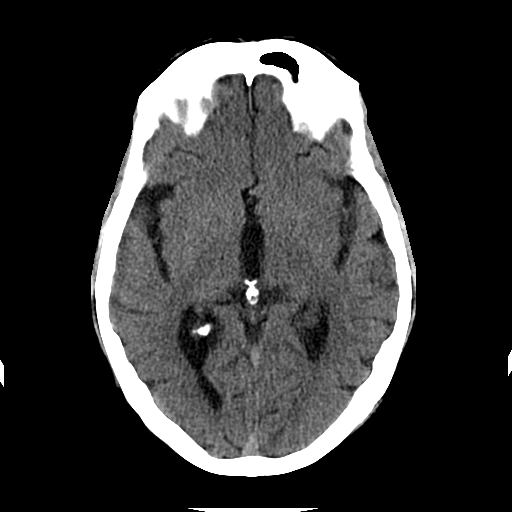
[im 15/30  brain]
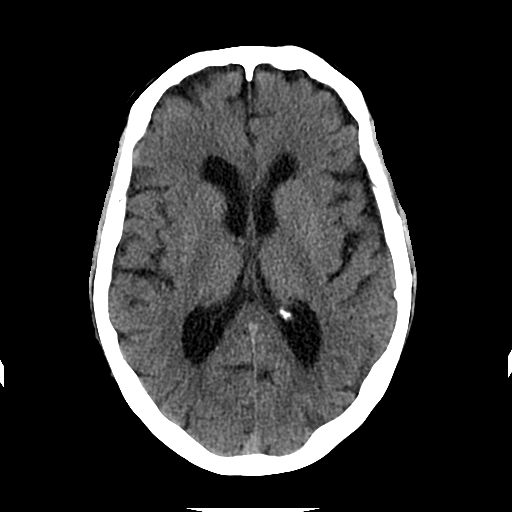
[im 17/30  brain]
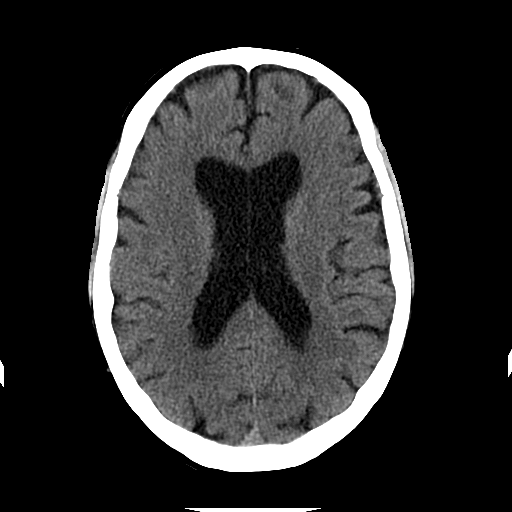
[im 19/30  brain]
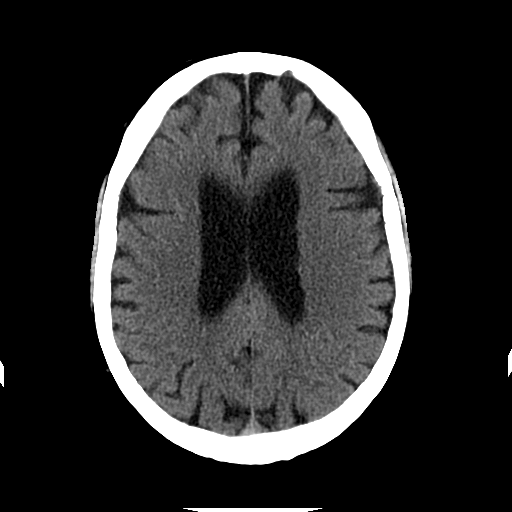
[im 19/30  bone]
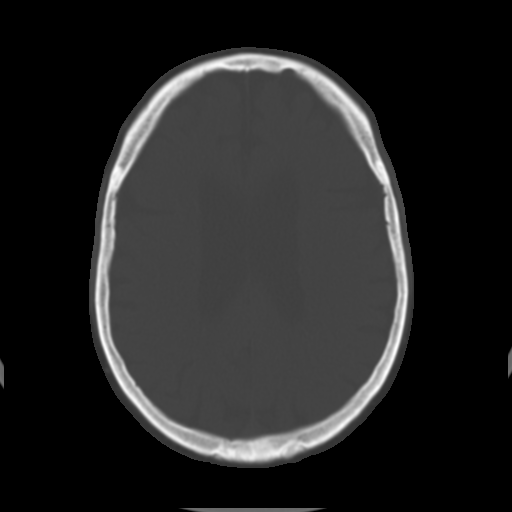
[im 21/30  brain]
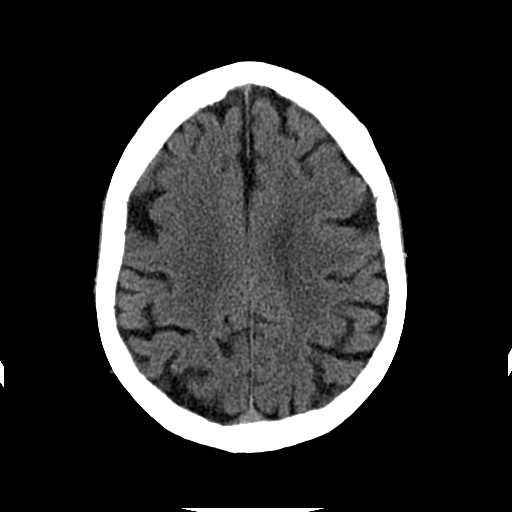
[im 23/30  brain]
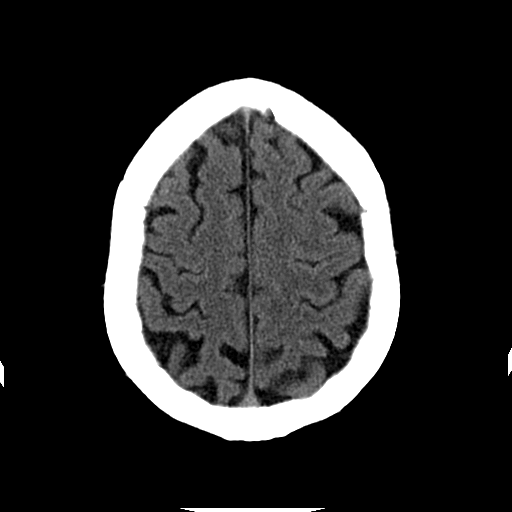
[im 25/30  brain]
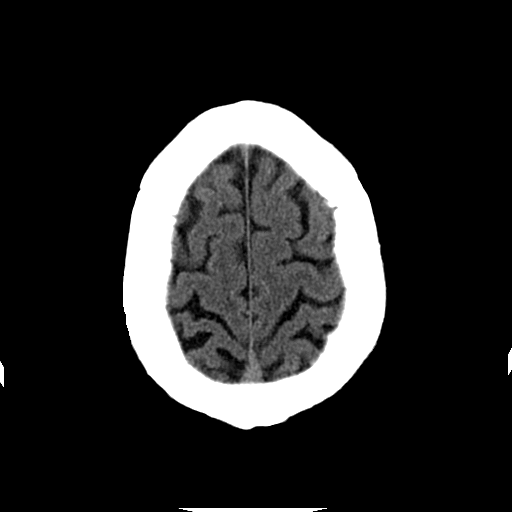
[im 27/30  brain]
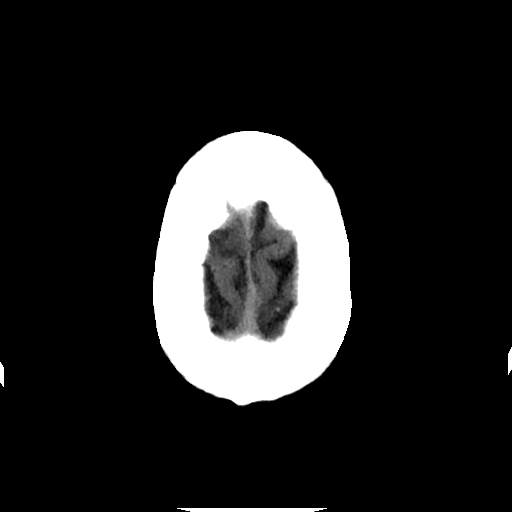
[im 27/30  bone]
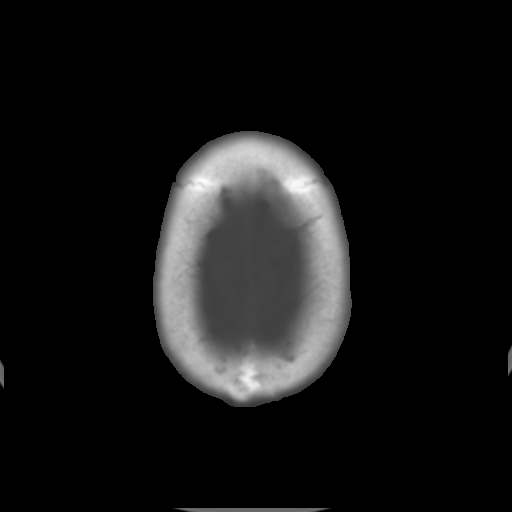

[Series 202: head w/o bone, idose (1) · axial · non-contrast · 0.45mm/px · z∈[+75,+115]mm · 3 of 30 slices shown]
[im 3/30  bone]
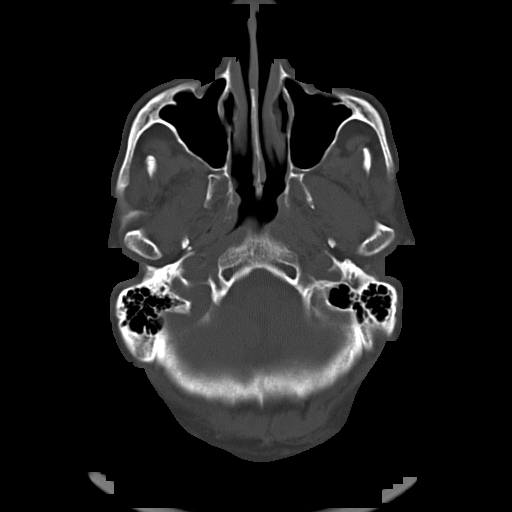
[im 7/30  bone]
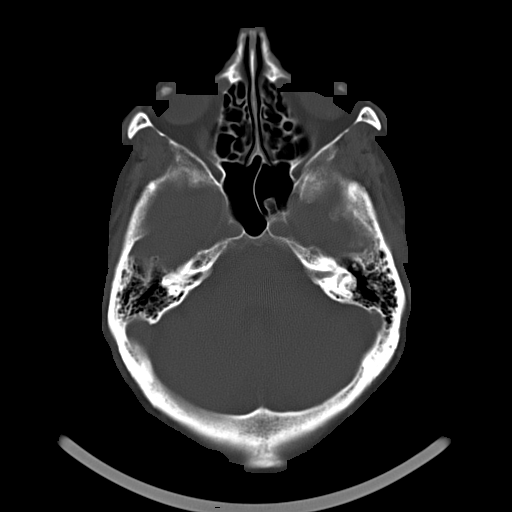
[im 11/30  bone]
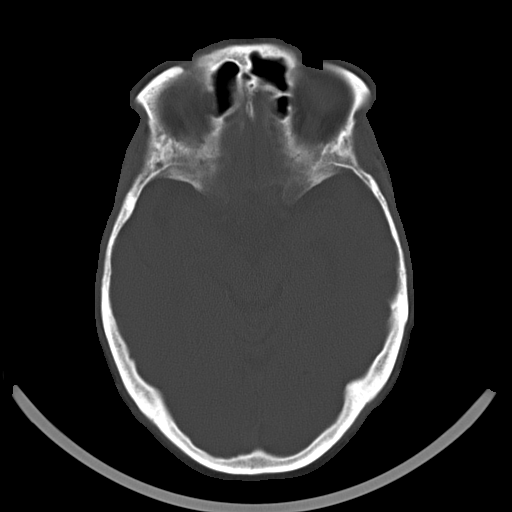

[16 of 30 positions shown; findings below may reference images not displayed]

FINDINGS: Diffusely enlarged ventricles and subarachnoid spaces. No
intracranial hemorrhage, mass lesion or CT evidence of acute
infarction. Unremarkable bones and included paranasal sinuses.
IMPRESSION: Mild to moderate diffuse cerebral and cerebellar atrophy with mild
progression. No acute abnormality.

## 2015-04-15 MED ORDER — CEPHALEXIN 500 MG PO CAPS: 500.0000 mg | ORAL_CAPSULE | Freq: Four times a day (QID) | ORAL | Status: DC

## 2015-04-15 NOTE — ED Notes (Signed)
Patient d/c'd from monitor, continuous pulse oximetry and blood pressure cuff; patient getting dressed to be discharged home; visitors at bedside

## 2015-04-15 NOTE — ED Notes (Signed)
States did not feel good when woke yesterday 180/101 and states took several times yesterday.  Then felt better and bp 117/65. Woke up at 0400 this am and felt miserable and head felt achy and bp 180/104.  Does not take BP medications. Pt states one week ago [place] and got dizzy but it resolved.  Pt continues to have headache in the base of head.  Pt father had a massive stroke in the brainstem around same age.  Pt noticed this am his right leg got weak and then resolved.  Pt reports some since of dizziness.

## 2015-04-15 NOTE — ED Notes (Signed)
Patient placed back on monitor, continuous pulse oximetry and blood pressure cuff; visitors at bedside

## 2015-04-15 NOTE — ED Provider Notes (Signed)
CSN: [id number]     Arrival date & time 04/15/15  1005 History   First MD Initiated Contact with Patient 04/15/15 1151     Chief Complaint  Patient presents with  . Hypertension     (Consider location/radiation/quality/duration/timing/severity/associated sxs/prior Treatment) HPI   The patient is a 81 year old male with history of GERD, GI bleed, BPH, and anemia, who presents to the emergency room this morning for 2 days of high blood pressure and "not feeling well". He does not have any history of hypertension states that yesterday morning he felt awful and took his blood pressure was 180/101. Throughout the day he continued to feel poorly until the afternoon. He repeated his blood pressure is back in his normal range, 117/65. Throughout the day he did not experience any chest pain, shortness of breath, syncope, facial asymmetry, weakness or confusion. He did have a slight headache that was intermittent. This morning he woke up early in the morning again did not feel well, and his blood pressure again was elevated to 180/104. He had a sharp pain in the back of his neck and head, that had gradually increased throughout the morning. He also had some brief right leg weakness that occurred at 5 AM yesterday while in the restroom. He did not have the leg give out on him, did not lose consciousness, did not experience any dizziness or lightheadedness associated with weakness. It spontaneously returned to normal however with a headache and his elevated blood pressures he was concerned and presented to the emergency room for evaluation. He has a 10-pack-year history but has not smoked for several years. He has no history of hypertension or diabetes. His father had a brainstem stroke when he was 35. Patient currently is denying any symptoms other than a mild headache in the back of his head, and some urinary frequency which he has been managing with his primary care physician and is going to be seen by urology  next week.  He states he has increased urinary frequency, and urinary urgency, no problems initiating or maintaining a urinary stream although does feel the need to go immediately after voiding. He denies dysuria, denies hematuria.  He denies any abdominal pain or flank pain, no N, V, D.  Past Medical History  Diagnosis Date  . GERD (gastroesophageal reflux disease)     nisseum fundoplication 1937  . Anemia   . BPH (benign prostatic hyperplasia)   . Kidney stones     History of  . History of urinary frequency   . Urinary urgency   . Hiatal hernia   . History of TMJ syndrome     mild  . Arthritis   . Blood transfusion   . GI bleed 12/12    got 3 units blood  . External hemorrhoids    Past Surgical History  Procedure Laterality Date  . Nissen fundoplication  9024  . Shoulder arthroscopy  1954    rt  . Abdominal surgery  1985    tummy tuc  . Colonoscopy    . Cataract extraction      left  . Esophagogastroduodenoscopy  06/12/2011    Procedure: ESOPHAGOGASTRODUODENOSCOPY (EGD);  Surgeon: Scarlette Shorts, MD;  Location: [facility];  Service: Endoscopy;  Laterality: N/A;  . Shoulder arthroscopy  05/28/2011    left  . Orif humerus fracture  12/24/2011    Procedure: OPEN REDUCTION INTERNAL FIXATION (ORIF) PROXIMAL HUMERUS FRACTURE;  Surgeon: Cammie Sickle., MD;  Location: [facility];  Service:  Orthopedics;  Laterality: Left;  ORIF left humerus  . Hardware removal  02/23/2012    Procedure: HARDWARE REMOVAL;  Surgeon: Cammie Sickle., MD;  Location: [facility];  Service: Orthopedics;  Laterality: Left;  biceps tenolysis and screw removal  . Spine fussion    . Eye surgery    . Back surgery    . Hernia repair     Family History  Problem Relation Age of Onset  . Heart defect Father     aortic valve  . Nephrolithiasis Mother   . Lung cancer Brother     smoker  . Colon cancer Neg Hx   . Esophageal cancer Neg Hx   . Stomach cancer Neg Hx   .  Rectal cancer Neg Hx    Social History  Substance Use Topics  . Smoking status: Former Smoker -- 1.00 packs/day for 12 years    Types: Cigarettes    Quit date: 05/26/1966  . Smokeless tobacco: Never Used  . Alcohol Use: 0.6 oz/week    1 Shots of liquor per week     Comment: red wine with dinner    Review of Systems  HENT: Negative.   Eyes: Negative for photophobia, pain and visual disturbance.  Cardiovascular: Negative for chest pain, palpitations and leg swelling.  Gastrointestinal: Negative.  Negative for nausea, vomiting, abdominal pain, diarrhea and constipation.  Endocrine: Positive for polyuria.  Genitourinary: Positive for urgency and frequency. Negative for dysuria, hematuria, flank pain, decreased urine volume, discharge, penile swelling, scrotal swelling, enuresis, difficulty urinating, genital sores, penile pain and testicular pain.  Musculoskeletal: Negative.  Negative for gait problem.  Skin: Negative.   Neurological: Positive for dizziness. Negative for tremors, seizures, syncope, facial asymmetry, speech difficulty and numbness.      Allergies  Review of patient's allergies indicates no known allergies.  Home Medications   Prior to Admission medications   Medication Sig Start Date End Date Taking? Authorizing Provider  Calcium Carb-Cholecalciferol (CALCIUM 600 + D) 600-200 MG-UNIT TABS Take 1 tablet by mouth every morning.   Yes Historical Provider, MD  Coenzyme Q10 (COQ10) 200 MG CAPS Take 200 mg by mouth every morning.   Yes Historical Provider, MD  finasteride (PROSCAR) 5 MG tablet Take 5 mg by mouth at bedtime.   Yes Historical Provider, MD  glucosamine-chondroitin 500-400 MG tablet Take 1 tablet by mouth 2 (two) times daily.   Yes Historical Provider, MD  Methylsulfonylmethane (MSM) 1000 MG CAPS Take 1,000 mg by mouth 2 (two) times daily.   Yes Historical Provider, MD  Multiple Vitamins-Minerals (PRESERVISION AREDS) CAPS Take 1 capsule by mouth 2 (two) times  daily.   Yes Historical Provider, MD  OVER THE COUNTER MEDICATION Take 1 capsule by mouth every morning. Beta Sitosterol capsule   Yes Historical Provider, MD  OVER THE COUNTER MEDICATION Take 1 capsule by mouth every morning. "Bladderwrack 168mcg iodine"   Yes Historical Provider, MD  TURMERIC PO Take 150 mg by mouth 2 (two) times daily.   Yes Historical Provider, MD  vitamin B-12 (CYANOCOBALAMIN) 1000 MCG tablet Take 1,000 mcg by mouth every morning.   Yes Historical Provider, MD  ZANTAC 150 MG tablet Take 1 tablet (150 mg total) by mouth every evening. 08/15/14  Yes Jerene Bears, MD  cephALEXin (KEFLEX) 500 MG capsule Take 1 capsule (500 mg total) by mouth 4 (four) times daily. 04/15/15   Delsa Grana, PA-C   BP 118/64 mmHg  Pulse 63  Temp(Src) 97.9 F (36.6  C) (Oral)  Resp 18  SpO2 94% Physical Exam  Constitutional: He is oriented to person, place, and time. He appears well-developed and well-nourished. He is cooperative.  Non-toxic appearance. He does not have a sickly appearance. No distress.  HENT:  Head: Normocephalic and atraumatic.  Right Ear: External ear normal.  Left Ear: External ear normal.  Nose: Nose normal.  Mouth/Throat: Oropharynx is clear and moist. No oropharyngeal exudate.  Eyes: Conjunctivae, EOM and lids are normal. Pupils are equal, round, and reactive to light. Right eye exhibits no discharge. Left eye exhibits no discharge. No scleral icterus.  Neck: Normal range of motion. No JVD present. No tracheal deviation present. No thyromegaly present.  Cardiovascular: Normal rate, regular rhythm, normal heart sounds and intact distal pulses.  Exam reveals no gallop and no friction rub.   No murmur heard. Pulmonary/Chest: Effort normal and breath sounds normal. No respiratory distress. He has no wheezes. He has no rales. He exhibits no tenderness.  Abdominal: Soft. Bowel sounds are normal. He exhibits no distension and no mass. There is no tenderness. There is no rebound and  no guarding.  Musculoskeletal: Normal range of motion. He exhibits no edema or tenderness.  Lymphadenopathy:    He has no cervical adenopathy.  Neurological: He is alert and oriented to person, place, and time. He has normal reflexes. He is not disoriented. He displays no atrophy and no tremor. No cranial nerve deficit or sensory deficit. He exhibits normal muscle tone. He displays no seizure activity. Coordination and gait normal.  Skin: Skin is warm and dry. No rash noted. He is not diaphoretic. No erythema. No pallor.  Psychiatric: He has a normal mood and affect. His behavior is normal. Judgment and thought content normal.  Nursing note and vitals reviewed.   ED Course  Procedures (including critical care time) Labs Review Labs Reviewed  COMPREHENSIVE METABOLIC PANEL - Abnormal; Notable for the following:    ALT 15 (*)    All other components within normal limits  URINALYSIS, ROUTINE W REFLEX MICROSCOPIC (NOT AT [facility]) - Abnormal; Notable for the following:    Leukocytes, UA SMALL (*)    All other components within normal limits  URINE MICROSCOPIC-ADD ON - Abnormal; Notable for the following:    Squamous Epithelial / LPF FEW (*)    Bacteria, UA MANY (*)    All other components within normal limits  URINE CULTURE  PROTIME-INR  APTT  CBC  DIFFERENTIAL  I-STAT TROPOININ, ED  I-STAT CHEM 8, ED    Imaging Review Ct Head Wo Contrast  04/15/2015   CLINICAL DATA:  Intermittent hypertension yesterday with dizziness and headache. Similar symptoms this morning.  EXAM: CT HEAD WITHOUT CONTRAST  TECHNIQUE: Contiguous axial images were obtained from the base of the skull through the vertex without intravenous contrast.  COMPARISON:  08/28/2010.  FINDINGS: Diffusely enlarged ventricles and subarachnoid spaces. No intracranial hemorrhage, mass lesion or CT evidence of acute infarction. Unremarkable bones and included paranasal sinuses.  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 No acute abnormality.   Electronically Signed   By: Claudie Revering M.D.   On: 04/15/2015 12:28   I have personally reviewed and evaluated these images and lab results as part of my medical decision-making.   EKG Interpretation   Date/Time:  Monday April 15 2015 11:25:03 EDT Ventricular Rate:  60 PR Interval:  140 QRS Duration: 84 QT Interval:  424 QTC Calculation: 424 R Axis:   56  Text Interpretation:  Normal sinus rhythm Nonspecific ST abnormality  Abnormal ECG ED PHYSICIAN INTERPRETATION AVAILABLE IN [facility]  Confirmed by TEST, Record ([id number]) on 04/16/2015 7:50:09 AM      MDM   Final diagnoses:  Essential hypertension  Urinary frequency    Pt with vague complaints of "not feeling well" and at home BP readings high Presents with HTN 170/94, with complaints of occasional dizziness episodes, ED staff initiated a stroke/dizziness workup  At the time of my exam pt was well appearing, denied CP, SOB, abdominal pain, normal neuro exam, ambulated w/o difficulty, A&Ox4 Labs unremarkable, UA +leuks, +bacteria, few epithelial, pt went to the restroom frequently while in ED, with frequency, urgency, and other vague constitutional sx over the past two days, felt that he had UTI sx, will culture urine and tx for UTI with keflex. Without intervention his BP normalized while in the ED, pt will f/up outpt with his PCP for HTN Pt has urology follow up next week.   The pt and his family at bedside has been given strict return precautions.  I have explained to them that there is no indication for emergent intervention here in the ED for his transiently elevated BP.  I have explained that it is better for the pt to follow up with his PCP to monitor and manage HTN, he may feel better after treatment of UTI.  Pt and family - all questions answered.  I additionally offered to have Dr. Tomi Bamberger available to speak with them, but they declined.  Filed Vitals:    04/15/15 1315 04/15/15 1330 04/15/15 1400 04/15/15 1409  BP: 136/76 164/87 118/64 118/64  Pulse: 63 67 62 63  Temp:      TempSrc:      Resp: 16 21 16 18   SpO2: 97% 96% 94% 94%   Pt was seen in a shared with with my attending physician, Dr. Tomi Bamberger, who is in agreement with assessment and plan. Pt was d/c home in stable condition, vitals normal.      Delsa Grana, PA-C 04/16/15 1205  Dorie Rank, MD 04/19/15 [phone]

## 2015-04-15 NOTE — Discharge Instructions (Signed)
Hypertension Hypertension, commonly called high blood pressure, is when the force of blood pumping through your arteries is too strong. Your arteries are the blood vessels that carry blood from your heart throughout your body. A blood pressure reading consists of a higher number over a lower number, such as 110/72. The higher number (systolic) is the pressure inside your arteries when your heart pumps. The lower number (diastolic) is the pressure inside your arteries when your heart relaxes. Ideally you want your blood pressure below 120/80. Hypertension forces your heart to work harder to pump blood. Your arteries may become narrow or stiff. Having hypertension puts you at risk for heart disease, stroke, and other problems.  RISK FACTORS Some risk factors for high blood pressure are controllable. Others are not.  Risk factors you cannot control include:   Race. You may be at higher risk if you are African American.  Age. Risk increases with age.  Gender. Men are at higher risk than women before age 45 years. After age 65, women are at higher risk than men. Risk factors you can control include:  Not getting enough exercise or physical activity.  Being overweight.  Getting too much fat, sugar, calories, or salt in your diet.  Drinking too much alcohol. SIGNS AND SYMPTOMS Hypertension does not usually cause signs or symptoms. Extremely high blood pressure (hypertensive crisis) may cause headache, anxiety, shortness of breath, and nosebleed. DIAGNOSIS  To check if you have hypertension, your health care provider will measure your blood pressure while you are seated, with your arm held at the level of your heart. It should be measured at least twice using the same arm. Certain conditions can cause a difference in blood pressure between your right and left arms. A blood pressure reading that is higher than normal on one occasion does not mean that you need treatment. If one blood pressure reading  is high, ask your health care provider about having it checked again. TREATMENT  Treating high blood pressure includes making lifestyle changes and possibly taking medicine. Living a healthy lifestyle can help lower high blood pressure. You may need to change some of your habits. Lifestyle changes may include:  Following the DASH diet. This diet is high in fruits, vegetables, and whole grains. It is low in salt, red meat, and added sugars.  Getting at least 2 hours of brisk physical activity every week.  Losing weight if necessary.  Not smoking.  Limiting alcoholic beverages.  Learning ways to reduce stress. If lifestyle changes are not enough to get your blood pressure under control, your health care provider may prescribe medicine. You may need to take more than one. Work closely with your health care provider to understand the risks and benefits. HOME CARE INSTRUCTIONS  Have your blood pressure rechecked as directed by your health care provider.   Take medicines only as directed by your health care provider. Follow the directions carefully. Blood pressure medicines must be taken as prescribed. The medicine does not work as well when you skip doses. Skipping doses also puts you at risk for problems.   Do not smoke.   Monitor your blood pressure at home as directed by your health care provider. SEEK MEDICAL CARE IF:   You think you are having a reaction to medicines taken.  You have recurrent headaches or feel dizzy.  You have swelling in your ankles.  You have trouble with your vision. SEEK IMMEDIATE MEDICAL CARE IF:  You develop a severe headache or confusion.    You have unusual weakness, numbness, or feel faint.  You have severe chest or abdominal pain.  You vomit repeatedly.  You have trouble breathing. MAKE SURE YOU:   Understand these instructions.  Will watch your condition.  Will get help right away if you are not doing well or get worse. Document  Released: 06/29/2005 Document Revised: 11/13/2013 Document Reviewed: 04/21/2013 ExitCare Patient Information 2015 [employer]. This information is not intended to replace advice given to you by your health care provider. Make sure you discuss any questions you have with your health care provider. Urinary Tract Infection Urinary tract infections (UTIs) can develop anywhere along your urinary tract. Your urinary tract is your body's drainage system for removing wastes and extra water. Your urinary tract includes two kidneys, two ureters, a bladder, and a urethra. Your kidneys are a pair of bean-shaped organs. Each kidney is about the size of your fist. They are located below your ribs, one on each side of your spine. CAUSES Infections are caused by microbes, which are microscopic organisms, including fungi, viruses, and bacteria. These organisms are so small that they can only be seen through a microscope. Bacteria are the microbes that most commonly cause UTIs. SYMPTOMS  Symptoms of UTIs may vary by age and gender of the patient and by the location of the infection. Symptoms in young women typically include a frequent and intense urge to urinate and a painful, burning feeling in the bladder or urethra during urination. Older women and men are more likely to be tired, shaky, and weak and have muscle aches and abdominal pain. A fever may mean the infection is in your kidneys. Other symptoms of a kidney infection include pain in your back or sides below the ribs, nausea, and vomiting. DIAGNOSIS To diagnose a UTI, your caregiver will ask you about your symptoms. Your caregiver also will ask to provide a urine sample. The urine sample will be tested for bacteria and white blood cells. White blood cells are made by your body to help fight infection. TREATMENT  Typically, UTIs can be treated with medication. Because most UTIs are caused by a bacterial infection, they usually can be treated with the use of  antibiotics. The choice of antibiotic and length of treatment depend on your symptoms and the type of bacteria causing your infection. HOME CARE INSTRUCTIONS  If you were prescribed antibiotics, take them exactly as your caregiver instructs you. Finish the medication even if you feel better after you have only taken some of the medication.  Drink enough water and fluids to keep your urine clear or pale yellow.  Avoid caffeine, tea, and carbonated beverages. They tend to irritate your bladder.  Empty your bladder often. Avoid holding urine for long periods of time.  Empty your bladder before and after sexual intercourse.  After a bowel movement, women should cleanse from front to back. Use each tissue only once. SEEK MEDICAL CARE IF:   You have back pain.  You develop a fever.  Your symptoms do not begin to resolve within 3 days. SEEK IMMEDIATE MEDICAL CARE IF:   You have severe back pain or lower abdominal pain.  You develop chills.  You have nausea or vomiting.  You have continued burning or discomfort with urination. MAKE SURE YOU:   Understand these instructions.  Will watch your condition.  Will get help right away if you are not doing well or get worse. Document Released: 04/08/2005 Document Revised: 12/29/2011 Document Reviewed: 08/07/2011 ExitCare Patient Information 2015   [employer]. This information is not intended to replace advice given to you by your health care provider. Make sure you discuss any questions you have with your health care provider.  

## 2015-04-18 LAB — URINE CULTURE

## 2015-04-19 ENCOUNTER — Telehealth (HOSPITAL_COMMUNITY): Payer: Self-pay

## 2015-04-19 ENCOUNTER — Encounter: Payer: Self-pay | Admitting: Internal Medicine

## 2015-04-19 NOTE — Telephone Encounter (Signed)
Post ED Visit - Positive Culture Follow-up  Culture report reviewed by antimicrobial stewardship pharmacist:  [x]  Heide Guile, Pharm.D., BCPS []  Alycia Rossetti, Pharm.D., BCPS []  [place], Pharm.D., BCPS, AAHIVP []  Legrand [place], Pharm.D., BCPS, AAHIVP []  [place], Pharm.D. []  Milus Glazier, Pharm.D.  Positive urine culture, >/= 100,000 colonies -> Klebsiella Pneumoniae Treated with Cephalexin, organism sensitive to the same and no further patient follow-up is required at this time.  Dortha Kern 04/19/2015, 9:58 AM

## 2015-06-12 ENCOUNTER — Encounter: Payer: Self-pay | Admitting: Gastroenterology

## 2015-08-10 ENCOUNTER — Other Ambulatory Visit: Payer: Self-pay | Admitting: Internal Medicine

## 2015-08-12 ENCOUNTER — Telehealth: Payer: Self-pay | Admitting: Internal Medicine

## 2015-08-12 MED ORDER — ZANTAC 150 MG PO TABS: 150.0000 mg | ORAL_TABLET | Freq: Every evening | ORAL | Status: DC

## 2015-08-12 NOTE — Telephone Encounter (Signed)
Rx sent to pharmacy   

## 2015-08-12 NOTE — Telephone Encounter (Signed)
Spoke to pharmacy and okayed generic ranitidine if patient has been getting it as generic.

## 2015-10-04 ENCOUNTER — Ambulatory Visit: Payer: Medicare Other | Admitting: Internal Medicine

## 2015-11-06 ENCOUNTER — Other Ambulatory Visit: Payer: Self-pay | Admitting: Internal Medicine

## 2015-11-13 ENCOUNTER — Ambulatory Visit (INDEPENDENT_AMBULATORY_CARE_PROVIDER_SITE_OTHER): Payer: Medicare Other | Admitting: Internal Medicine

## 2015-11-13 ENCOUNTER — Encounter: Payer: Self-pay | Admitting: Internal Medicine

## 2015-11-13 VITALS — BP 120/70 | HR 68 | Ht 71.5 in | Wt 212.4 lb

## 2015-11-13 DIAGNOSIS — R1013 Epigastric pain: Secondary | ICD-10-CM | POA: Diagnosis not present

## 2015-11-13 DIAGNOSIS — R198 Other specified symptoms and signs involving the digestive system and abdomen: Secondary | ICD-10-CM

## 2015-11-13 MED ORDER — RANITIDINE HCL 150 MG PO TABS: 150.0000 mg | ORAL_TABLET | Freq: Every evening | ORAL | Status: DC

## 2015-11-13 NOTE — Progress Notes (Signed)
   Subjective:    Patient ID: Lucas Shepherd, male    DOB: 08-14-1928, 82 y.o.   MRN: [id number]  HPI Lucas Shepherd is an 82 year old male with a past medical history of GERD status post Nissen fundoplication, dyspepsia with epigastric pain, chronic constipation and hemorrhoids who is here for follow-up. Last seen January 2016. At the time of his last visit he was having frequent epigastric discomfort worse at night. This has completely resolved with ranitidine 150 mg each evening. He denies abdominal pain. Denies reflux including heartburn, dysphagia and odynophagia. Bowel habits have been regular. He has noticed that at times his stool is formed at the beginning and turns to loose by the end of the bowel movement. No blood in his stool or melena. No pain with defecation. He denies feeling constipated. He is still playing golf 2-4 days per week. He reports his appetite has been good without nausea or vomiting.   Review of Systems As per history of present illness, otherwise negative  Current Medications, Allergies, Past Medical History, Past Surgical History, Family History and Social History were reviewed in [employer] record.      Objective:   Physical Exam BP 120/70 mmHg  Pulse 68  Ht 5' 11.5" (1.816 m)  Wt 212 lb 6.4 oz (96.344 kg)  BMI 29.21 kg/m2 Constitutional: Well-developed and well-nourished. No distress. HEENT: Normocephalic and atraumatic.Conjunctivae are normal.  No scleral icterus. Neck: Neck supple. Trachea midline. Cardiovascular: Normal rate, regular rhythm and intact distal pulses. No M/R/G Pulmonary/chest: Effort normal and breath sounds normal. No wheezing, rales or rhonchi. Abdominal: Soft, nontender, nondistended. Bowel sounds active throughout. There are no masses palpable. No hepatosplenomegaly. Extremities: no clubbing, cyanosis, or edema Neurological: Alert and oriented to person place and time. Skin: Skin is warm and dry.    Psychiatric: Normal mood and affect. Behavior is normal.      Assessment & Plan:  82 year old male with a past medical history of GERD status post Nissen fundoplication, dyspepsia with epigastric pain, chronic constipation and hemorrhoids who is here for follow-up.   1. Epigastric pain/dyspepsia -- improved and resolved with ranitidine. Continue ranitidine 150 mg each evening. Notify me should this pain return. No alarm symptoms to warrant endoscopy at this time.  2. Slight change in bowel movement -- no alarm symptoms. Last colonoscopy less than 3 years ago normal other than external hemorrhoids. I recommended he had Benefiber 1 tablespoon each day to help bulk the stool and hopefully aid with defecation. Call if worsening, change in symptoms or nonimprovement. He is happy with this plan  1 year follow-up, sooner if necessary 15 minutes spent with the patient today. Greater than 50% was spent in counseling and coordination of care with the patient

## 2015-11-13 NOTE — Patient Instructions (Addendum)
We have sent the following medications to your pharmacy for you to pick up at your convenience: ranitidine 150 mg one tablet by mouth at bedtime.   You can take over the counter Bene-fiber one tablespoon daily.   Please follow up with Dr. Hilarie Fredrickson in one year.   If you are age 80 or older, your body mass index should be between 23-30. Your Body mass index is 29.21 kg/(m^2). If this is out of the aforementioned range listed, please consider follow up with your Primary Care Provider.  If you are age 24 or younger, your body mass index should be between 19-25. Your Body mass index is 29.21 kg/(m^2). If this is out of the aformentioned range listed, please consider follow up with your Primary Care Provider.

## 2016-02-06 ENCOUNTER — Encounter: Payer: Self-pay | Admitting: Internal Medicine

## 2016-02-07 ENCOUNTER — Other Ambulatory Visit: Payer: Self-pay

## 2016-02-07 MED ORDER — OMEPRAZOLE 40 MG PO CPDR: 40.0000 mg | DELAYED_RELEASE_CAPSULE | Freq: Every day | ORAL | 3 refills | Status: DC

## 2016-02-07 NOTE — Telephone Encounter (Signed)
While on high dose ASA (325 mg BID), I would recommend adding omeprazole 40 mg daily before breakfast to his BID ranitidine He has history of proximal gastric ulcer in 2012 Once he returns to 81 mg ASA daily he can drop the PPI in favor of BID ranitidine

## 2016-02-26 ENCOUNTER — Ambulatory Visit: Payer: Medicare Other | Admitting: Internal Medicine

## 2016-04-06 ENCOUNTER — Encounter: Payer: Self-pay | Admitting: Internal Medicine

## 2016-07-29 ENCOUNTER — Ambulatory Visit: Payer: Medicare Other | Admitting: Internal Medicine

## 2016-08-04 ENCOUNTER — Encounter: Payer: Self-pay | Admitting: Internal Medicine

## 2016-08-04 ENCOUNTER — Ambulatory Visit (INDEPENDENT_AMBULATORY_CARE_PROVIDER_SITE_OTHER): Payer: Medicare Other | Admitting: Internal Medicine

## 2016-08-04 VITALS — BP 106/60 | HR 60 | Ht 71.5 in | Wt 186.0 lb

## 2016-08-04 DIAGNOSIS — R143 Flatulence: Secondary | ICD-10-CM

## 2016-08-04 DIAGNOSIS — R150 Incomplete defecation: Secondary | ICD-10-CM | POA: Diagnosis not present

## 2016-08-04 DIAGNOSIS — R151 Fecal smearing: Secondary | ICD-10-CM

## 2016-08-04 DIAGNOSIS — K648 Other hemorrhoids: Secondary | ICD-10-CM

## 2016-08-04 NOTE — Progress Notes (Signed)
Subjective:    Patient ID: Lucas Shepherd, male    DOB: 01/16/1929, 83 y.o.   MRN: [id number]  HPI Mr. Banghart is an 83 year old male with history of GERD status post Nissen fundoplication, dyspepsia, chronic constipation and hemorrhoids he is here for follow-up. Last seen in May 2017. His GERD and dyspeptic symptoms including epigastric pain have resolved and not recurred on ranitidine therapy. He is using ranitidine 150 every evening.  His biggest issue of late is related to defecation. He is feeling incomplete evacuation and almost as if there is a "narrowing" in his anal canal. He is also dealing with fecal smearing. No bleeding. He does have to strain to have bowel movement and also passed gas. He does report a "tremendous amount of lower gas". He has stopped Benefiber because it led to some much extra gas. No help with simethicone. He is eating flaxseed. He denies constipation and reports stools as soft but require straining to pass. He does occasionally feel lower abdominal soreness after straining. In the past he tried Linzess which was way too aggressive. He has not tried MiraLAX in the past though it was listed by Dr. Sharlett Iles.   Review of Systems As per history of present illness, otherwise negative  Current Medications, Allergies, Past Medical History, Past Surgical History, Family History and Social History were reviewed in [employer] [employer] record.     Objective:   Physical Exam BP 106/60 (BP Location: Left Arm, Patient Position: Sitting, Cuff Size: Normal)   Pulse 60   Ht 5' 11.5" (1.816 m)   Wt 186 lb (84.4 kg)   BMI 25.58 kg/m  Constitutional: Well-developed and well-nourished. No distress. HEENT: Normocephalic and atraumatic. Conjunctivae are normal.  No scleral icterus. Neck: Neck supple. Trachea midline. Cardiovascular: Normal rate, regular rhythm and intact distal pulses.  Pulmonary/chest: Effort normal and breath sounds normal. No  wheezing, rales or rhonchi. Abdominal: Soft, nontender, nondistended. Bowel sounds active throughout.  Rectal: normal external. Anal examination, intact sphincter tone with normal pelvic descent with Valsalva, no masses, soft brown stool in the vault  Anoscopy was performed with the patient in the left lateral position with chaperone present which revealed small to medium size internal hemorrhoids greatest right anterior, left lateral Extremities: no clubbing, cyanosis, or edema Neurological: Alert and oriented to person place and time. Skin: Skin is warm and dry. Psychiatric: Normal mood and affect. Behavior is normal.      Assessment & Plan:  83 year old male with history of GERD status post Nissen fundoplication, dyspepsia, chronic constipation and hemorrhoids he is here for follow-up.  1. Symptomatic internal hemorrhoids/fecal smearing/incomplete defecation -- up-to-date with colonoscopy last exam being less than 4 years ago. No polyps at that time. Exam did reveal hemorrhoids. Pelvic floor dysfunction also possibility as his constipation though he doesn't feel constipation is a problem. Certainly hemorrhoids may be contributing to a great ill of the symptoms and I have recommended internal hemorrhoidal banding after discussion of the risks, benefits and alternatives.  PROCEDURE NOTE:  The patient presents with symptomatic grade 2 internal hemorrhoids, requesting rubber band ligation of his hemorrhoidal disease.  All risks, benefits and alternative forms of therapy were described and informed consent was obtained.   The anorectum was pre-medicated with 0.125% nitroglycerin ointment The decision was made to band the RA internal hemorrhoid, and the [place] was used to perform band ligation without complication.   Digital anorectal examination was then performed to assure proper positioning of the  band, and to adjust the banded tissue as required.  The patient was discharged  home without pain or other issues.  Dietary and behavioral recommendations were given and along with follow-up instructions.    The patient will return as scheduled for  follow-up and possible additional banding as required. No complications were encountered and the patient tolerated the procedure well.  2. Abdominal bloating and flatulence symptom -- I recommended he try probiotic in the form of align 1 capsule daily 1 month. Low bloating diet.

## 2016-08-04 NOTE — Patient Instructions (Addendum)
You have been scheduled for your 2nd hemorrhoidal banding on 08/26/16 at 3:45 pm.  You are scheduled for your 3rd hemorrhodial banding on 09/14/16 @ 4 pm.  HEMORRHOID BANDING PROCEDURE    FOLLOW-UP CARE   1. The procedure you have had should have been relatively painless since the banding of the area involved does not have nerve endings and there is no pain sensation.  The rubber band cuts off the blood supply to the hemorrhoid and the band may fall off as soon as 48 hours after the banding (the band may occasionally be seen in the toilet bowl following a bowel movement). You may notice a temporary feeling of fullness in the rectum which should respond adequately to plain Tylenol or Motrin.  2. Following the banding, avoid strenuous exercise that evening and resume full activity the next day.  A sitz bath (soaking in a warm tub) or bidet is soothing, and can be useful for cleansing the area after bowel movements.     3. To avoid constipation, take two tablespoons of natural wheat bran, natural oat bran, flax, Benefiber or any over the counter fiber supplement and increase your water intake to 7-8 glasses daily.    4. Unless you have been prescribed anorectal medication, do not put anything inside your rectum for two weeks: No suppositories, enemas, fingers, etc.  5. Occasionally, you may have more bleeding than usual after the banding procedure.  This is often from the untreated hemorrhoids rather than the treated one.  Don't be concerned if there is a tablespoon or so of blood.  If there is more blood than this, lie flat with your bottom higher than your head and apply an ice pack to the area. If the bleeding does not stop within a half an hour or if you feel faint, call our office at [phone] or go to the emergency room.  6. Problems are not common; however, if there is a substantial amount of bleeding, severe pain, chills, fever or difficulty passing urine (very rare) or other problems,  you should call us at [phone] or report to the nearest emergency room.  7. Do not stay seated continuously for more than 2-3 hours for a day or two after the procedure.  Tighten your buttock muscles 10-15 times every two hours and take 10-15 deep breaths every 1-2 hours.  Do not spend more than a few minutes on the toilet if you cannot empty your bowel; instead re-visit the toilet at a later time.

## 2016-08-26 ENCOUNTER — Ambulatory Visit (INDEPENDENT_AMBULATORY_CARE_PROVIDER_SITE_OTHER): Payer: Medicare Other | Admitting: Internal Medicine

## 2016-08-26 ENCOUNTER — Encounter: Payer: Self-pay | Admitting: Internal Medicine

## 2016-08-26 DIAGNOSIS — K648 Other hemorrhoids: Secondary | ICD-10-CM | POA: Diagnosis not present

## 2016-08-26 DIAGNOSIS — R151 Fecal smearing: Secondary | ICD-10-CM | POA: Diagnosis not present

## 2016-08-26 NOTE — Progress Notes (Signed)
Mr. Andrada returns today to consider hemorrhoidal banding #2 Initial hemorrhoidal banding performed on 08/04/2016 to the right anterior internal hemorrhoid  Initial symptoms were incomplete defecation, fecal smearing, perianal swelling and discomfort He also continued Metamucil and used align for 2 weeks  He reports that he's had absolutely no constipation and he feels that [place] may have been helpful. He is also benefiting from daily Benefiber use. Occasionally he uses this twice a day. He tolerated the first banding very well and has had no bleeding or pain Fecal smearing is also seemingly improved  PROCEDURE NOTE:  The patient presents with symptomatic grade 2 internal hemorrhoids, requesting rubber band ligation of his hemorrhoidal disease.  All risks, benefits and alternative forms of therapy were described and informed consent was obtained.   The anorectum was pre-medicated with 0.125% nitroglycerin ointment The decision was made to band the LL (RA on visit #1) internal hemorrhoid, and the [place] was used to perform band ligation without complication.   Digital anorectal examination was then performed to assure proper positioning of the band, and to adjust the banded tissue as required.  The patient was discharged home without pain or other issues.  Dietary and behavioral recommendations were given and along with follow-up instructions.     The following adjunctive treatments were recommended: 2 additional weeks of Align samples to be used daily. Can buy over-the-counter if he deems helpful Continue Benefiber 1-2 tablespoons daily  The patient will return as scheduled for follow-up and possible additional banding as required. No complications were encountered and the patient tolerated the procedure well.

## 2016-08-26 NOTE — Patient Instructions (Signed)
Please keep your 3rd hemorrhoidal banding appointment on 09/14/16.  HEMORRHOID BANDING PROCEDURE    FOLLOW-UP CARE   1. The procedure you have had should have been relatively painless since the banding of the area involved does not have nerve endings and there is no pain sensation.  The rubber band cuts off the blood supply to the hemorrhoid and the band may fall off as soon as 48 hours after the banding (the band may occasionally be seen in the toilet bowl following a bowel movement). You may notice a temporary feeling of fullness in the rectum which should respond adequately to plain Tylenol or Motrin.  2. Following the banding, avoid strenuous exercise that evening and resume full activity the next day.  A sitz bath (soaking in a warm tub) or bidet is soothing, and can be useful for cleansing the area after bowel movements.     3. To avoid constipation, take two tablespoons of natural wheat bran, natural oat bran, flax, Benefiber or any over the counter fiber supplement and increase your water intake to 7-8 glasses daily.    4. Unless you have been prescribed anorectal medication, do not put anything inside your rectum for two weeks: No suppositories, enemas, fingers, etc.  5. Occasionally, you may have more bleeding than usual after the banding procedure.  This is often from the untreated hemorrhoids rather than the treated one.  Don't be concerned if there is a tablespoon or so of blood.  If there is more blood than this, lie flat with your bottom higher than your head and apply an ice pack to the area. If the bleeding does not stop within a half an hour or if you feel faint, call our office at [phone] or go to the emergency room.  6. Problems are not common; however, if there is a substantial amount of bleeding, severe pain, chills, fever or difficulty passing urine (very rare) or other problems, you should call us at [phone] or report to the nearest emergency room.  7. Do  not stay seated continuously for more than 2-3 hours for a day or two after the procedure.  Tighten your buttock muscles 10-15 times every two hours and take 10-15 deep breaths every 1-2 hours.  Do not spend more than a few minutes on the toilet if you cannot empty your bowel; instead re-visit the toilet at a later time.

## 2016-09-14 ENCOUNTER — Ambulatory Visit (INDEPENDENT_AMBULATORY_CARE_PROVIDER_SITE_OTHER): Payer: Medicare Other | Admitting: Internal Medicine

## 2016-09-14 ENCOUNTER — Encounter: Payer: Self-pay | Admitting: Internal Medicine

## 2016-09-14 VITALS — BP 116/60 | HR 60 | Ht 70.25 in | Wt 182.1 lb

## 2016-09-14 DIAGNOSIS — K648 Other hemorrhoids: Secondary | ICD-10-CM | POA: Diagnosis not present

## 2016-09-14 NOTE — Patient Instructions (Signed)
HEMORRHOID BANDING PROCEDURE    FOLLOW-UP CARE   1. The procedure you have had should have been relatively painless since the banding of the area involved does not have nerve endings and there is no pain sensation.  The rubber band cuts off the blood supply to the hemorrhoid and the band may fall off as soon as 48 hours after the banding (the band may occasionally be seen in the toilet bowl following a bowel movement). You may notice a temporary feeling of fullness in the rectum which should respond adequately to plain Tylenol or Motrin.  2. Following the banding, avoid strenuous exercise that evening and resume full activity the next day.  A sitz bath (soaking in a warm tub) or bidet is soothing, and can be useful for cleansing the area after bowel movements.     3. To avoid constipation, take two tablespoons of natural wheat bran, natural oat bran, flax, Benefiber or any over the counter fiber supplement and increase your water intake to 7-8 glasses daily.    4. Unless you have been prescribed anorectal medication, do not put anything inside your rectum for two weeks: No suppositories, enemas, fingers, etc.  5. Occasionally, you may have more bleeding than usual after the banding procedure.  This is often from the untreated hemorrhoids rather than the treated one.  Don't be concerned if there is a tablespoon or so of blood.  If there is more blood than this, lie flat with your bottom higher than your head and apply an ice pack to the area. If the bleeding does not stop within a half an hour or if you feel faint, call our office at [phone] or go to the emergency room.  6. Problems are not common; however, if there is a substantial amount of bleeding, severe pain, chills, fever or difficulty passing urine (very rare) or other problems, you should call us at [phone] or report to the nearest emergency room.  7. Do not stay seated continuously for more than 2-3 hours for a day or two  after the procedure.  Tighten your buttock muscles 10-15 times every two hours and take 10-15 deep breaths every 1-2 hours.  Do not spend more than a few minutes on the toilet if you cannot empty your bowel; instead re-visit the toilet at a later time.    Please follow up with Dr Hilarie Fredrickson in 9 months.  Please e-mail Dr Hilarie Fredrickson with MyChart and let us know how you are feeling after this last banding.

## 2016-09-14 NOTE — Progress Notes (Signed)
Mr. Sanda Klein returns today to consider hemorrhoidal banding #3 Previously hemorrhoidal banding performed on 08/04/2016 and 08/26/2016 He tolerated this well  Symptoms prior to hemorrhoidal treatment were incomplete defecation, fecal smearing, perineal swelling and discomfort  He has done very well and reported near total resolution of fecal smearing. Incomplete defecation is also improved as has perianal discomfort   PROCEDURE NOTE:  The patient presents with symptomatic grade 2 internal hemorrhoids, requesting rubber band ligation of his hemorrhoidal disease.  All risks, benefits and alternative forms of therapy were described and informed consent was obtained.   The anorectum was pre-medicated with 0.125% nitroglycerin ointment The decision was made to band the RP (RA on visit 1, LL on visit 2) internal hemorrhoid, and the [place] was used to perform band ligation without complication.   Digital anorectal examination was then performed to assure proper positioning of the band, and to adjust the banded tissue as required.  The patient was discharged home without pain or other issues.  Dietary and behavioral recommendations were given and along with follow-up instructions.     The following adjunctive treatments were recommended: Continue Benefiber 1-2 tablespoons daily Align can be used as needed  The patient will return in 9 months for routine follow-up, sooner as needed for hemorrhoidal symptoms  No complications were encountered and the patient tolerated the procedure well.

## 2016-11-29 ENCOUNTER — Other Ambulatory Visit: Payer: Self-pay | Admitting: Internal Medicine

## 2016-12-02 ENCOUNTER — Ambulatory Visit (INDEPENDENT_AMBULATORY_CARE_PROVIDER_SITE_OTHER): Payer: Medicare Other | Admitting: Internal Medicine

## 2016-12-02 ENCOUNTER — Encounter: Payer: Self-pay | Admitting: Internal Medicine

## 2016-12-02 VITALS — BP 128/68 | HR 76 | Ht 70.0 in | Wt 179.0 lb

## 2016-12-02 DIAGNOSIS — R150 Incomplete defecation: Secondary | ICD-10-CM

## 2016-12-02 DIAGNOSIS — K5900 Constipation, unspecified: Secondary | ICD-10-CM | POA: Diagnosis not present

## 2016-12-02 MED ORDER — NA SULFATE-K SULFATE-MG SULF 17.5-3.13-1.6 GM/177ML PO SOLN
ORAL | 0 refills | Status: DC
Start: 2016-12-02 — End: 2016-12-30

## 2016-12-02 NOTE — Progress Notes (Signed)
   Subjective:    Patient ID: Lucas Shepherd, male    DOB: December 16, 1928, 83 y.o.   MRN: [id number]  HPI Lucas Shepherd is an 83 year old male with a history of GERD status post Nissen fundoplication, dyspepsia, chronic constipation and internal hemorrhoids now status post banding 3 who presents for follow-up. He is here alone today. He reports he continues to have issues with incomplete defecation. Need to strain bowel movement. He feels like there is something "blocking" his rectum. The hemorrhoidal banding visits were helpful particularly from a fecal smearing and perianal swelling standpoint but not with his incomplete defecation symptom. This is despite Benefiber daily. Benefiber is definitely help with the consistency and hardness to his stool. He is not having bleeding or blood in his stool nor melena.  He denies abdominal pain. Denies upper GI complaint including dysphagia and odynophagia. He has been using my fitness [email or web address] in an effort to lose weight. He has lost 35 pounds over 6-8 months. He reports this is very intentional. He is playing golf 3 days a week. His diet is restricted to 1600-1700 cal daily.  He has recently been diagnosed with UTI and is about to start ampicillin. He is also planning to restart his align  His last colonoscopy was in May 2014 with Dr. Sharlett Iles. This was a normal exam except for hemorrhoids. At that time Linzess was prescribed and he reports he did not respond well to this medicine. He seems to remember diarrhea and abdominal pain.  Review of Systems As per history of present illness, otherwise negative  Current Medications, Allergies, Past Medical History, Past Surgical History, Family History and Social History were reviewed in [employer] record.     Objective:   Physical Exam BP 128/68   Pulse 76   Ht 5\' 10"  (1.778 m)   Wt 179 lb (81.2 kg)   BMI 25.68 kg/m  Constitutional: Well-developed and well-nourished. No  distress. HEENT: Normocephalic and atraumatic.  Conjunctivae are normal.  No scleral icterus. Neck: Neck supple. Trachea midline. Cardiovascular: Normal rate, regular rhythm and intact distal pulses. No M/R/G Pulmonary/chest: Effort normal and breath sounds normal. No wheezing, rales or rhonchi. Abdominal: Soft, nontender, nondistended. Bowel sounds active throughout. There are no masses palpable. No hepatosplenomegaly. Extremities: no clubbing, cyanosis, or edema Neurological: Alert and oriented to person place and time. Skin: Skin is warm and dry. Psychiatric: Normal mood and affect. Behavior is normal.     Assessment & Plan:   83 year old male with a history of GERD status post Nissen fundoplication, dyspepsia, chronic constipation and internal hemorrhoids now status post banding 3 who presents for follow-up.  1. Incomplete defecation/mild constipation -- given his persistent symptoms I'm inclined to repeat colonoscopy. We had a discussion as to the risk benefits and alternatives and he is agreeable and wishes to proceed with colonoscopy. We will rule out distal lesion contributing to incomplete defecation. If negative colonoscopy would recommend we add MiraLAX to his Benefiber. Continue Benefiber 1-2 tablespoons daily. Resume align  2. UTI -- he will begin ampicillin as prescribed by primary care. He will add back Align daily as a probiotic  3. Internal hemorrhoids -- treated by banding 3. Improvement with perianal discomfort and fecal smearing.  25 minutes spent with the patient today. Greater than 50% was spent in counseling and coordination of care with the patient

## 2016-12-02 NOTE — Patient Instructions (Signed)
You have been scheduled for a colonoscopy. Please follow written instructions given to you at your visit today.  Please pick up your prep supplies at the pharmacy within the next 1-3 days. If you use inhalers (even only as needed), please bring them with you on the day of your procedure. Your physician has requested that you go to www.startemmi.com and enter the access code given to you at your visit today. This web site gives a general overview about your procedure. However, you should still follow specific instructions given to you by our office regarding your preparation for the procedure.  Continue Benefiber.  Restart your Align once daily.  If you are age 48 or older, your body mass index should be between 23-30. Your Body mass index is 25.68 kg/m. If this is out of the aforementioned range listed, please consider follow up with your Primary Care Provider.  If you are age 66 or younger, your body mass index should be between 19-25. Your Body mass index is 25.68 kg/m. If this is out of the aformentioned range listed, please consider follow up with your Primary Care Provider.

## 2016-12-16 ENCOUNTER — Encounter: Payer: Self-pay | Admitting: Internal Medicine

## 2016-12-25 ENCOUNTER — Encounter: Payer: Self-pay | Admitting: Internal Medicine

## 2016-12-25 NOTE — Telephone Encounter (Signed)
Error

## 2016-12-30 ENCOUNTER — Encounter: Payer: Self-pay | Admitting: Internal Medicine

## 2016-12-30 ENCOUNTER — Ambulatory Visit (AMBULATORY_SURGERY_CENTER): Payer: Medicare Other | Admitting: Internal Medicine

## 2016-12-30 VITALS — BP 160/84 | HR 61 | Temp 97.5°F | Resp 15 | Ht 70.0 in | Wt 179.0 lb

## 2016-12-30 DIAGNOSIS — R150 Incomplete defecation: Secondary | ICD-10-CM | POA: Diagnosis not present

## 2016-12-30 DIAGNOSIS — R194 Change in bowel habit: Secondary | ICD-10-CM | POA: Diagnosis not present

## 2016-12-30 DIAGNOSIS — D122 Benign neoplasm of ascending colon: Secondary | ICD-10-CM

## 2016-12-30 MED ORDER — SODIUM CHLORIDE 0.9 % IV SOLN: 500.0000 mL | INTRAVENOUS | Status: DC

## 2016-12-30 NOTE — Op Note (Signed)
[facility] Patient Name: Lucas Shepherd Procedure Date: 12/30/2016 3:25 PM MRN: [id number] Endoscopist: Jerene Bears , MD Age: 83 Referring MD:  Date of Birth: Aug 05, 1928 Gender: Male Account #: [id number] Procedure:                Colonoscopy Indications:              Change in bowel habits, incomplete defection, mild                            constipation Medicines:                Monitored Anesthesia Care Procedure:                Pre-Anesthesia Assessment:                           - Prior to the procedure, a History and Physical                            was performed, and patient medications and                            allergies were reviewed. The patient's tolerance of                            previous anesthesia was also reviewed. The risks                            and benefits of the procedure and the sedation                            options and risks were discussed with the patient.                            All questions were answered, and informed consent                            was obtained. Prior Anticoagulants: The patient has                            taken no previous anticoagulant or antiplatelet                            agents. ASA Grade Assessment: II - A patient with                            mild systemic disease. After reviewing the risks                            and benefits, the patient was deemed in                            satisfactory condition to undergo the procedure.  After obtaining informed consent, the colonoscope                            was passed under direct vision. Throughout the                            procedure, the patient's blood pressure, pulse, and                            oxygen saturations were monitored continuously. The                            Colonoscope was introduced through the anus and                            advanced to the the cecum, identified by                        appendiceal orifice and ileocecal valve. The                            colonoscopy was performed without difficulty. The                            patient tolerated the procedure well. The quality                            of the bowel preparation was good. The ileocecal                            valve, appendiceal orifice, and rectum were                            photographed. Scope In: 3:36:56 PM Scope Out: 3:53:58 PM Scope Withdrawal Time: 0 hours 12 minutes 39 seconds  Total Procedure Duration: 0 hours 17 minutes 2 seconds  Findings:                 The digital rectal exam was normal.                           A 5 mm polyp was found in the ascending colon. The                            polyp was sessile. The polyp was removed with a                            cold snare. Resection and retrieval were complete.                           A small post hemorrhoidal banding scar was found in                            the distal rectum in the left lateral position.  Non-bleeding internal hemorrhoids were found during                            retroflexion in the right anterior position. The                            hemorrhoids were small.                           The exam was otherwise without abnormality. Complications:            No immediate complications. Estimated Blood Loss:     Estimated blood loss was minimal. Impression:               - One 5 mm polyp in the ascending colon, removed                            with a cold snare. Resected and retrieved.                           - Scar from prior hemorrhoidal banding in the                            distal rectum.                           - Non-bleeding internal hemorrhoids in the right                            anterior position.                           - The examination was otherwise normal. Recommendation:           - Patient has a contact number available for                             emergencies. The signs and symptoms of potential                            delayed complications were discussed with the                            patient. Return to normal activities tomorrow.                            Written discharge instructions were provided to the                            patient.                           - Resume previous diet.                           - Continue present medications. Consider adding  MiraLax 17 g daily to Benefiber.                           - Await pathology results.                           - No recommendation at this time regarding repeat                            colonoscopy due to age. Jerene Bears, MD 12/30/2016 4:02:35 PM This report has been signed electronically.

## 2016-12-30 NOTE — Patient Instructions (Signed)
YOU HAD AN ENDOSCOPIC PROCEDURE TODAY AT [facility]:   Refer to the procedure report that was given to you for any specific questions about what was found during the examination.  If the procedure report does not answer your questions, please call your gastroenterologist to clarify.  If you requested that your care partner not be given the details of your procedure findings, then the procedure report has been included in a sealed envelope for you to review at your convenience later.  YOU SHOULD EXPECT: Some feelings of bloating in the abdomen. Passage of more gas than usual.  Walking can help get rid of the air that was put into your GI tract during the procedure and reduce the bloating. If you had a lower endoscopy (such as a colonoscopy or flexible sigmoidoscopy) you may notice spotting of blood in your stool or on the toilet paper. If you underwent a bowel prep for your procedure, you may not have a normal bowel movement for a few days.  Please Note:  You might notice some irritation and congestion in your nose or some drainage.  This is from the oxygen used during your procedure.  There is no need for concern and it should clear up in a day or so.  SYMPTOMS TO REPORT IMMEDIATELY:   Following lower endoscopy (colonoscopy or flexible sigmoidoscopy):  Excessive amounts of blood in the stool  Significant tenderness or worsening of abdominal pains  Swelling of the abdomen that is new, acute  Fever of 100F or higher  For urgent or emergent issues, a gastroenterologist can be reached at any hour by calling [phone].   DIET:  We do recommend a small meal at first, but then you may proceed to your regular diet.  Drink plenty of fluids but you should avoid alcoholic beverages for 24 hours.  ACTIVITY:  You should plan to take it easy for the rest of today and you should NOT DRIVE or use heavy machinery until tomorrow (because of the sedation medicines used during the test).     FOLLOW UP: Our staff will call the number listed on your records the next business day following your procedure to check on you and address any questions or concerns that you may have regarding the information given to you following your procedure. If we do not reach you, we will leave a message.  However, if you are feeling well and you are not experiencing any problems, there is no need to return our call.  We will assume that you have returned to your regular daily activities without incident.  Polyp information given. Hemorrhoid information given.    If any biopsies were taken you will be contacted by phone or by letter within the next 1-3 weeks.  Please call us at [phone] if you have not heard about the biopsies in 3 weeks.    SIGNATURES/CONFIDENTIALITY: You and/or your care partner have signed paperwork which will be entered into your electronic medical record.  These signatures attest to the fact that that the information above on your After Visit Summary has been reviewed and is understood.  Full responsibility of the confidentiality of this discharge information lies with you and/or your care-partner.

## 2016-12-30 NOTE — Progress Notes (Signed)
Called to room to assist during endoscopic procedure.  Patient ID and intended procedure confirmed with present staff. Received instructions for my participation in the procedure from the performing physician.  

## 2016-12-30 NOTE — Progress Notes (Signed)
Report to PACU, RN, vss, BBS= Clear.  

## 2016-12-31 ENCOUNTER — Telehealth: Payer: Self-pay

## 2016-12-31 ENCOUNTER — Telehealth: Payer: Self-pay | Admitting: *Deleted

## 2016-12-31 NOTE — Telephone Encounter (Signed)
  Follow up Call-  Call back number 12/30/2016  Post procedure Call Back phone  # [phone] or [phone]  Permission to leave phone message Yes  Some recent data might be hidden     Patient questions:  Do you have a fever, pain , or abdominal swelling? No. Pain Score  0 *  Have you tolerated food without any problems? Yes.    Have you been able to return to your normal activities? Yes.    Do you have any questions about your discharge instructions: Diet   No. Medications  No. Follow up visit  No.  Do you have questions or concerns about your Care? No.  Actions: * If pain score is 4 or above: No action needed, pain <4.  Pt. Stated "I am going for a big golf game."

## 2017-01-05 ENCOUNTER — Encounter: Payer: Self-pay | Admitting: Internal Medicine

## 2017-01-11 ENCOUNTER — Encounter: Payer: Self-pay | Admitting: Internal Medicine

## 2017-01-18 ENCOUNTER — Encounter: Payer: Self-pay | Admitting: Internal Medicine

## 2017-02-21 ENCOUNTER — Encounter: Payer: Self-pay | Admitting: Internal Medicine

## 2017-03-30 ENCOUNTER — Encounter: Payer: Medicare Other | Admitting: Internal Medicine

## 2017-03-31 ENCOUNTER — Encounter: Payer: Medicare Other | Admitting: Internal Medicine

## 2017-04-13 ENCOUNTER — Encounter: Payer: Self-pay | Admitting: Internal Medicine

## 2017-04-14 ENCOUNTER — Encounter: Payer: Self-pay | Admitting: Internal Medicine

## 2017-05-21 ENCOUNTER — Other Ambulatory Visit: Payer: Self-pay | Admitting: Internal Medicine

## 2017-06-18 ENCOUNTER — Ambulatory Visit: Payer: Medicare Other | Admitting: Internal Medicine

## 2017-06-18 ENCOUNTER — Encounter: Payer: Self-pay | Admitting: Internal Medicine

## 2017-06-18 VITALS — BP 132/72 | HR 64 | Ht 70.0 in | Wt 176.0 lb

## 2017-06-18 DIAGNOSIS — K648 Other hemorrhoids: Secondary | ICD-10-CM

## 2017-06-18 DIAGNOSIS — K5900 Constipation, unspecified: Secondary | ICD-10-CM | POA: Diagnosis not present

## 2017-06-18 DIAGNOSIS — R198 Other specified symptoms and signs involving the digestive system and abdomen: Secondary | ICD-10-CM | POA: Diagnosis not present

## 2017-06-18 NOTE — Patient Instructions (Signed)
Continue Benefiber.  Please purchase the following medications over the counter and take as directed: Docusate 200 mg every night.  Call our office in 1 month if you are still having issues with having a bowel movement.  Please contact your spine specialist to see if you would be a candidate for kyphoplasty for your recent compression fracture.  If you are age 81 or older, your body mass index should be between 23-30. Your Body mass index is 25.25 kg/m. If this is out of the aforementioned range listed, please consider follow up with your Primary Care Provider.  If you are age 48 or younger, your body mass index should be between 19-25. Your Body mass index is 25.25 kg/m. If this is out of the aformentioned range listed, please consider follow up with your Primary Care Provider.   HEMORRHOID BANDING PROCEDURE    FOLLOW-UP CARE   1. The procedure you have had should have been relatively painless since the banding of the area involved does not have nerve endings and there is no pain sensation.  The rubber band cuts off the blood supply to the hemorrhoid and the band may fall off as soon as 48 hours after the banding (the band may occasionally be seen in the toilet bowl following a bowel movement). You may notice a temporary feeling of fullness in the rectum which should respond adequately to plain Tylenol or Motrin.  2. Following the banding, avoid strenuous exercise that evening and resume full activity the next day.  A sitz bath (soaking in a warm tub) or bidet is soothing, and can be useful for cleansing the area after bowel movements.     3. To avoid constipation, take two tablespoons of natural wheat bran, natural oat bran, flax, Benefiber or any over the counter fiber supplement and increase your water intake to 7-8 glasses daily.    4. Unless you have been prescribed anorectal medication, do not put anything inside your rectum for two weeks: No suppositories, enemas, fingers,  etc.  5. Occasionally, you may have more bleeding than usual after the banding procedure.  This is often from the untreated hemorrhoids rather than the treated one.  Don't be concerned if there is a tablespoon or so of blood.  If there is more blood than this, lie flat with your bottom higher than your head and apply an ice pack to the area. If the bleeding does not stop within a half an hour or if you feel faint, call our office at [phone] or go to the emergency room.  6. Problems are not common; however, if there is a substantial amount of bleeding, severe pain, chills, fever or difficulty passing urine (very rare) or other problems, you should call us at [phone] or report to the nearest emergency room.  7. Do not stay seated continuously for more than 2-3 hours for a day or two after the procedure.  Tighten your buttock muscles 10-15 times every two hours and take 10-15 deep breaths every 1-2 hours.  Do not spend more than a few minutes on the toilet if you cannot empty your bowel; instead re-visit the toilet at a later time.

## 2017-06-18 NOTE — Progress Notes (Signed)
Mr. Nehme returns today for additional hemorrhoidal banding.  He has had issues with incomplete defecation, fecal smearing and difficult time initiating defecation.  This led to a colonoscopy which I performed on 12/30/2016.  This revealed a 5 mm ascending colon polyp which was an adenoma and was removed.  There was internal hemorrhoids remaining in the right anterior position with hemorrhoidal banding scars in the other positions. He has not had any bleeding He has been on Benefiber 3 times daily and Align daily Bowel movements occur daily and can at times be hard, not only at times difficult to pass but physically hard stools Unfortunately he had a vertebral compression fracture about a month ago after lifting a heavy piece of furniture This has required some oxycodone use and he is following with his spine surgeon at [facility]:  The patient presents with symptomatic grade 2 internal hemorrhoids, requesting rubber band ligation of his hemorrhoidal disease.  All risks, benefits and alternative forms of therapy were described and informed consent was obtained.   The anorectum was pre-medicated with 0.125% nitroglycerin ointment The decision was made to band the RA (seen during recent colonoscopy) internal hemorrhoid, and the [place] was used to perform band ligation without complication.   Digital anorectal examination was then performed to assure proper positioning of the band, and to adjust the banded tissue as required.  The patient was discharged home without pain or other issues.  Dietary and behavioral recommendations were given and along with follow-up instructions.     The following adjunctive treatments were recommended: Continue Benefiber 3 times daily Begin docusate 200 mg nightly to soften stool If symptoms persist after this banding I recommend anorectal manometry to evaluate for dysynergic defecation and if present would likely benefit from pelvic floor  physical therapy or biofeedback  The patient will return as needed for follow-up. No complications were encountered and the patient tolerated the procedure well. He will call me in about a month if he is not fully improved or with persistent symptoms

## 2017-09-17 ENCOUNTER — Encounter: Payer: Self-pay | Admitting: Internal Medicine

## 2017-11-05 ENCOUNTER — Other Ambulatory Visit: Payer: Self-pay | Admitting: Internal Medicine

## 2017-11-25 ENCOUNTER — Encounter: Payer: Self-pay | Admitting: Internal Medicine

## 2018-03-30 ENCOUNTER — Other Ambulatory Visit: Payer: Self-pay

## 2018-03-30 MED ORDER — FAMOTIDINE 20 MG PO TABS: 20.0000 mg | ORAL_TABLET | Freq: Every day | ORAL | 6 refills | Status: DC

## 2018-05-11 ENCOUNTER — Ambulatory Visit: Payer: Medicare Other | Admitting: Internal Medicine

## 2018-05-11 ENCOUNTER — Encounter: Payer: Self-pay | Admitting: Internal Medicine

## 2018-05-11 VITALS — BP 122/60 | HR 64 | Ht 70.5 in | Wt 174.0 lb

## 2018-05-11 DIAGNOSIS — R151 Fecal smearing: Secondary | ICD-10-CM | POA: Diagnosis not present

## 2018-05-11 DIAGNOSIS — R198 Other specified symptoms and signs involving the digestive system and abdomen: Secondary | ICD-10-CM | POA: Diagnosis not present

## 2018-05-11 DIAGNOSIS — R140 Abdominal distension (gaseous): Secondary | ICD-10-CM | POA: Diagnosis not present

## 2018-05-11 DIAGNOSIS — R141 Gas pain: Secondary | ICD-10-CM

## 2018-05-11 NOTE — Patient Instructions (Signed)
Discontinue Miralax.  Change from Benefiber to citrucel 2 tablespoons daily. Do this for about 2 weeks and then call our office (phone [phone]) with an update on how you are doing.  We will work on getting you samples of Xifaxan.  If you are age 82 or older, your body mass index should be between 23-30. Your Body mass index is 24.61 kg/m. If this is out of the aforementioned range listed, please consider follow up with your Primary Care Provider.  If you are age 75 or younger, your body mass index should be between 19-25. Your Body mass index is 24.61 kg/m. If this is out of the aformentioned range listed, please consider follow up with your Primary Care Provider.

## 2018-05-11 NOTE — Progress Notes (Signed)
   Subjective:    Patient ID: Lucas Shepherd, male    DOB: Feb 02, 1929, 84 y.o.   MRN: [id number]  HPI Lucas Shepherd is an 84 year old male with a past medical history of adenomatous colon polyps, internal hemorrhoids status post band ligation, GERD with a history of Nissen fundoplication, history of chronic constipation who is here for follow-up.  He was last seen in December 2018 when he had additional hemorrhoidal banding to the right anterior internal hemorrhoid.  He had a colonoscopy in June 2018 showing internal hemorrhoid in the right anterior position and post banding scars in the distal rectum.  A small adenoma was removed from the ascending colon.  Exam was otherwise normal.  He has been taking MiraLAX 17 g daily and Benefiber 1 scoop in the morning 1 scoop at midday.  He continues to have issues with incomplete defecation, fecal smearing, bloating, abdominal pressure and rolling gas.  He has fecal seepage at times and stool has a foul odor.  No rectal bleeding or melena.  He continues to be very active playing golf 2 to 4 days a week and during his golf around he will often have to stop and wipe where he will have some fecal smearing.  No abdominal pain.  No upper GI or hepatobiliary complaint.  He uses tramadol before playing golf.  He is being treated for osteoporosis and getting Forteo injections.  He continues Pepcid 20 mg twice a day.  He also continues Align once daily  Review of Systems As per HPI, otherwise negative  Current Medications, Allergies, Past Medical History, Past Surgical History, Family History and Social History were reviewed in [employer] record.      Objective:   Physical Exam BP 122/60   Pulse 64   Ht 5' 10.5" (1.791 m)   Wt 174 lb (78.9 kg)   BMI 24.61 kg/m  Gen: awake, alert, NAD HEENT: anicteric Neuro: nonfocal     Assessment & Plan:  84 year old male with a past medical history of adenomatous colon polyps, internal  hemorrhoids status post band ligation, GERD with a history of Nissen fundoplication, history of chronic constipation who is here for follow-up.  1.  Nominal bloating/gas and flatulence/fecal smearing/incomplete defecation --he is up-to-date with colonoscopy which was very reassuring.  We have banded internal hemorrhoids and so I do not have a high suspicion that internal hemorrhoids are contributing to the symptoms.  I think that it is possible that he is having side effects from his current medications and perhaps MiraLAX is making his stool to soft and harder to pass for him.  He may have some pelvic floor dysfunction leading to dyssynergy defecation.  I recommended that he stop MiraLAX.  He will change Benefiber to Citrucel and plan for 2 tablespoons daily.  I told him to try this for 2 weeks and let me know how symptoms have changed.  He can communicate by phone or by patient portal.  If no dramatic improvement I would recommend we treat him with rifaximin 550 mg 3 times a day for 10 to 14 days.  This would work for SIBO but also hopefully reset the gut micro-biome and lead to more effective bowel movement while improving bloating and gas.  He is happy with this plan and will keep me informed.  25 minutes spent with the patient today. Greater than 50% was spent in counseling and coordination of care with the patient

## 2018-05-24 NOTE — Telephone Encounter (Signed)
I spoke to the patient by phone today We had taken him off MiraLAX in favor of 2 tablespoons of Benefiber daily Without MiraLAX he had 5 to 6 days of fairly significant constipation and despite straining was unable to produce a meaningful bowel movement On Saturday, 3 days ago, he resume MiraLAX but at half dose daily Today he had a normal and complete bowel movement On half dose MiraLAX he is not having loose stools nor fecal smearing No bleeding  Thus, we will continue Benefiber 2 tablespoons daily and half dose MiraLAX daily I asked that he let me know in about a week how he is doing If this fails to improve his symptoms we could consider low-dose Linzess or low-dose Amitiza I do not think additional hemorrhoidal banding will improve his overall constipation though he did have small internal hemorrhoids seen when we last looked.  Small hemorrhoids may explain some mild fecal seepage with passing gas but the significant fecal smearing occurred more with full dose MiraLAX when stools were too loose  He is happy with this plan He thanked me for the call

## 2018-06-03 ENCOUNTER — Other Ambulatory Visit: Payer: Self-pay | Admitting: Internal Medicine

## 2018-06-30 NOTE — Telephone Encounter (Signed)
Mr. Baldwin, I got your email. I am glad you were more pleased with the current regimen of half dose MiraLAX and Benefiber The symptoms that you are talking about that persist, are they bloating, gas and incomplete evacuation?  Fecal smearing/seepage?  If so, please have him do rifaximin 550 mg 3 times daily x14 days He will need samples

## 2018-07-01 NOTE — Telephone Encounter (Signed)
Please see if the patient would be willing to consider hemorrhoidectomy There still is hemorrhoidal tissue remaining after banding, and with his symptoms, surgery is felt to be the most definitive solution (given that banding did not resolve symptoms. For the gas, we could offer trial of rifaximin 550 mg TID x 14 days, but would need to sample (given not covered by medicare).   If he is willing to discuss with colorectal surgeon, refer to Dr. Dema Severin at [facility]

## 2018-08-16 NOTE — Telephone Encounter (Signed)
Please have him see Dr. Dema Severin to discuss his hemorrhoids I also thing anorectal mano is worthwhile to see how the rectum squeezes and empties -- if this were abnormal then biofeedback or pelvic PT would be the answer (rather than surg)

## 2018-08-17 ENCOUNTER — Telehealth: Payer: Self-pay

## 2018-08-17 NOTE — Telephone Encounter (Signed)
-----   Message from Mila Merry sent at 08/17/2018 10:11 AM EST ----- Regarding: RE: Referral His Consultation is on 09/01/18 at 11:30 Dr.White ----- Message ----- From: Algernon Huxley, RN Sent: 08/16/2018   3:59 PM EST To: Mila Merry Subject: Referral                                       Please let me know when this pt is scheduled for Hemorrhoid surgery.  Thanks, [employer]

## 2018-09-19 ENCOUNTER — Other Ambulatory Visit: Payer: Self-pay | Admitting: Internal Medicine

## 2019-03-05 ENCOUNTER — Other Ambulatory Visit: Payer: Self-pay | Admitting: Internal Medicine

## 2019-07-24 ENCOUNTER — Ambulatory Visit: Payer: Medicare Other | Attending: Internal Medicine

## 2019-07-24 ENCOUNTER — Ambulatory Visit: Payer: Medicare Other

## 2019-07-24 DIAGNOSIS — Z23 Encounter for immunization: Secondary | ICD-10-CM | POA: Insufficient documentation

## 2019-07-24 NOTE — Progress Notes (Signed)
   Covid-19 Vaccination Clinic  Name:  Lucas Shepherd    MRN: [id number] DOB: 06-Apr-1929  07/24/2019  Mr. Anstey was observed post Covid-19 immunization for 15 minutes without incidence. He was provided with Vaccine Information Sheet and instruction to access the V-Safe system.   Mr. Evaristo was instructed to call 911 with any severe reactions post vaccine: Marland Kitchen Difficulty breathing  . Swelling of your face and throat  . A fast heartbeat  . A bad rash all over your body  . Dizziness and weakness

## 2019-08-08 ENCOUNTER — Telehealth: Payer: Self-pay | Admitting: Internal Medicine

## 2019-08-08 NOTE — Telephone Encounter (Signed)
Patient is calling because there seems to be a shortage on Citrucel patient would like to know what else can he take.

## 2019-08-09 NOTE — Telephone Encounter (Signed)
Pt informed that he can use off brands of Citrucel. Pt voiced understanding.

## 2019-08-14 ENCOUNTER — Ambulatory Visit: Payer: Medicare PPO | Attending: Internal Medicine

## 2019-08-14 DIAGNOSIS — Z23 Encounter for immunization: Secondary | ICD-10-CM

## 2019-08-14 NOTE — Progress Notes (Signed)
   Covid-19 Vaccination Clinic  Name:  Lucas Shepherd    MRN: [id number] DOB: 1928/07/31  08/14/2019  Lucas Shepherd was observed post Covid-19 immunization for 15 minutes without incidence. He was provided with Vaccine Information Sheet and instruction to access the V-Safe system.   Lucas Shepherd was instructed to call 911 with any severe reactions post vaccine: Marland Kitchen Difficulty breathing  . Swelling of your face and throat  . A fast heartbeat  . A bad rash all over your body  . Dizziness and weakness    Immunizations Administered    Name Date Dose VIS Date Route   Pfizer COVID-19 Vaccine 08/14/2019 10:46 AM 0.3 mL 06/23/2019 Intramuscular   Manufacturer: [place]   Lot: [id number]   [place]: [id number]

## 2019-08-27 ENCOUNTER — Other Ambulatory Visit: Payer: Self-pay | Admitting: Internal Medicine

## 2019-09-15 ENCOUNTER — Encounter (HOSPITAL_COMMUNITY): Payer: Self-pay | Admitting: Emergency Medicine

## 2019-09-15 ENCOUNTER — Other Ambulatory Visit: Payer: Self-pay

## 2019-09-15 ENCOUNTER — Emergency Department (HOSPITAL_COMMUNITY): Payer: Medicare PPO

## 2019-09-15 ENCOUNTER — Telehealth: Payer: Self-pay | Admitting: Internal Medicine

## 2019-09-15 ENCOUNTER — Emergency Department (HOSPITAL_COMMUNITY)
Admission: EM | Admit: 2019-09-15 | Discharge: 2019-09-15 | Disposition: A | Discharge status: Home / Self Care | Payer: Medicare PPO | Attending: Emergency Medicine | Admitting: Emergency Medicine

## 2019-09-15 DIAGNOSIS — R030 Elevated blood-pressure reading, without diagnosis of hypertension: Secondary | ICD-10-CM | POA: Diagnosis not present

## 2019-09-15 DIAGNOSIS — Z79899 Other long term (current) drug therapy: Secondary | ICD-10-CM | POA: Insufficient documentation

## 2019-09-15 DIAGNOSIS — Z87891 Personal history of nicotine dependence: Secondary | ICD-10-CM | POA: Diagnosis not present

## 2019-09-15 DIAGNOSIS — R079 Chest pain, unspecified: Secondary | ICD-10-CM | POA: Diagnosis not present

## 2019-09-15 DIAGNOSIS — R072 Precordial pain: Secondary | ICD-10-CM | POA: Diagnosis not present

## 2019-09-15 LAB — BASIC METABOLIC PANEL
Anion gap: 11 (ref 5–15)
BUN: 23 mg/dL (ref 8–23)
CO2: 25 mmol/L (ref 22–32)
Calcium: 10 mg/dL (ref 8.9–10.3)
Chloride: 103 mmol/L (ref 98–111)
Creatinine, Ser: 0.94 mg/dL (ref 0.61–1.24)
GFR calc Af Amer: >60 mL/min (ref >60)
GFR calc non Af Amer: >60 mL/min (ref >60)
Glucose, Bld: 123 mg/dL — ABNORMAL HIGH (ref 70–99)
Potassium: 5 mmol/L (ref 3.5–5.1)
Sodium: 139 mmol/L (ref 135–145)

## 2019-09-15 LAB — TROPONIN I (HIGH SENSITIVITY)
Troponin I (High Sensitivity): 7 ng/L (ref <18)
Troponin I (High Sensitivity): 7 ng/L (ref <18)

## 2019-09-15 LAB — CBC
HCT: 42.1 % (ref 39.0–52.0)
Hemoglobin: 14.2 g/dL (ref 13.0–17.0)
MCH: 32.7 pg (ref 26.0–34.0)
MCHC: 33.7 g/dL (ref 30.0–36.0)
MCV: 97 fL (ref 80.0–100.0)
Platelets: 208 10*3/uL (ref 150–400)
RBC: 4.34 MIL/uL (ref 4.22–5.81)
RDW: 12.8 % (ref 11.5–15.5)
WBC: 7.7 10*3/uL (ref 4.0–10.5)
nRBC: 0 % (ref 0.0–0.2)

## 2019-09-15 IMAGING — CR DG CHEST 2V
2 series · 2 of 2 positions shown · non-contrast
Comparison: Radiograph [DATE]

CLINICAL DATA: Chest pain

EXAM:
CHEST - 2 VIEW

[chest pa]
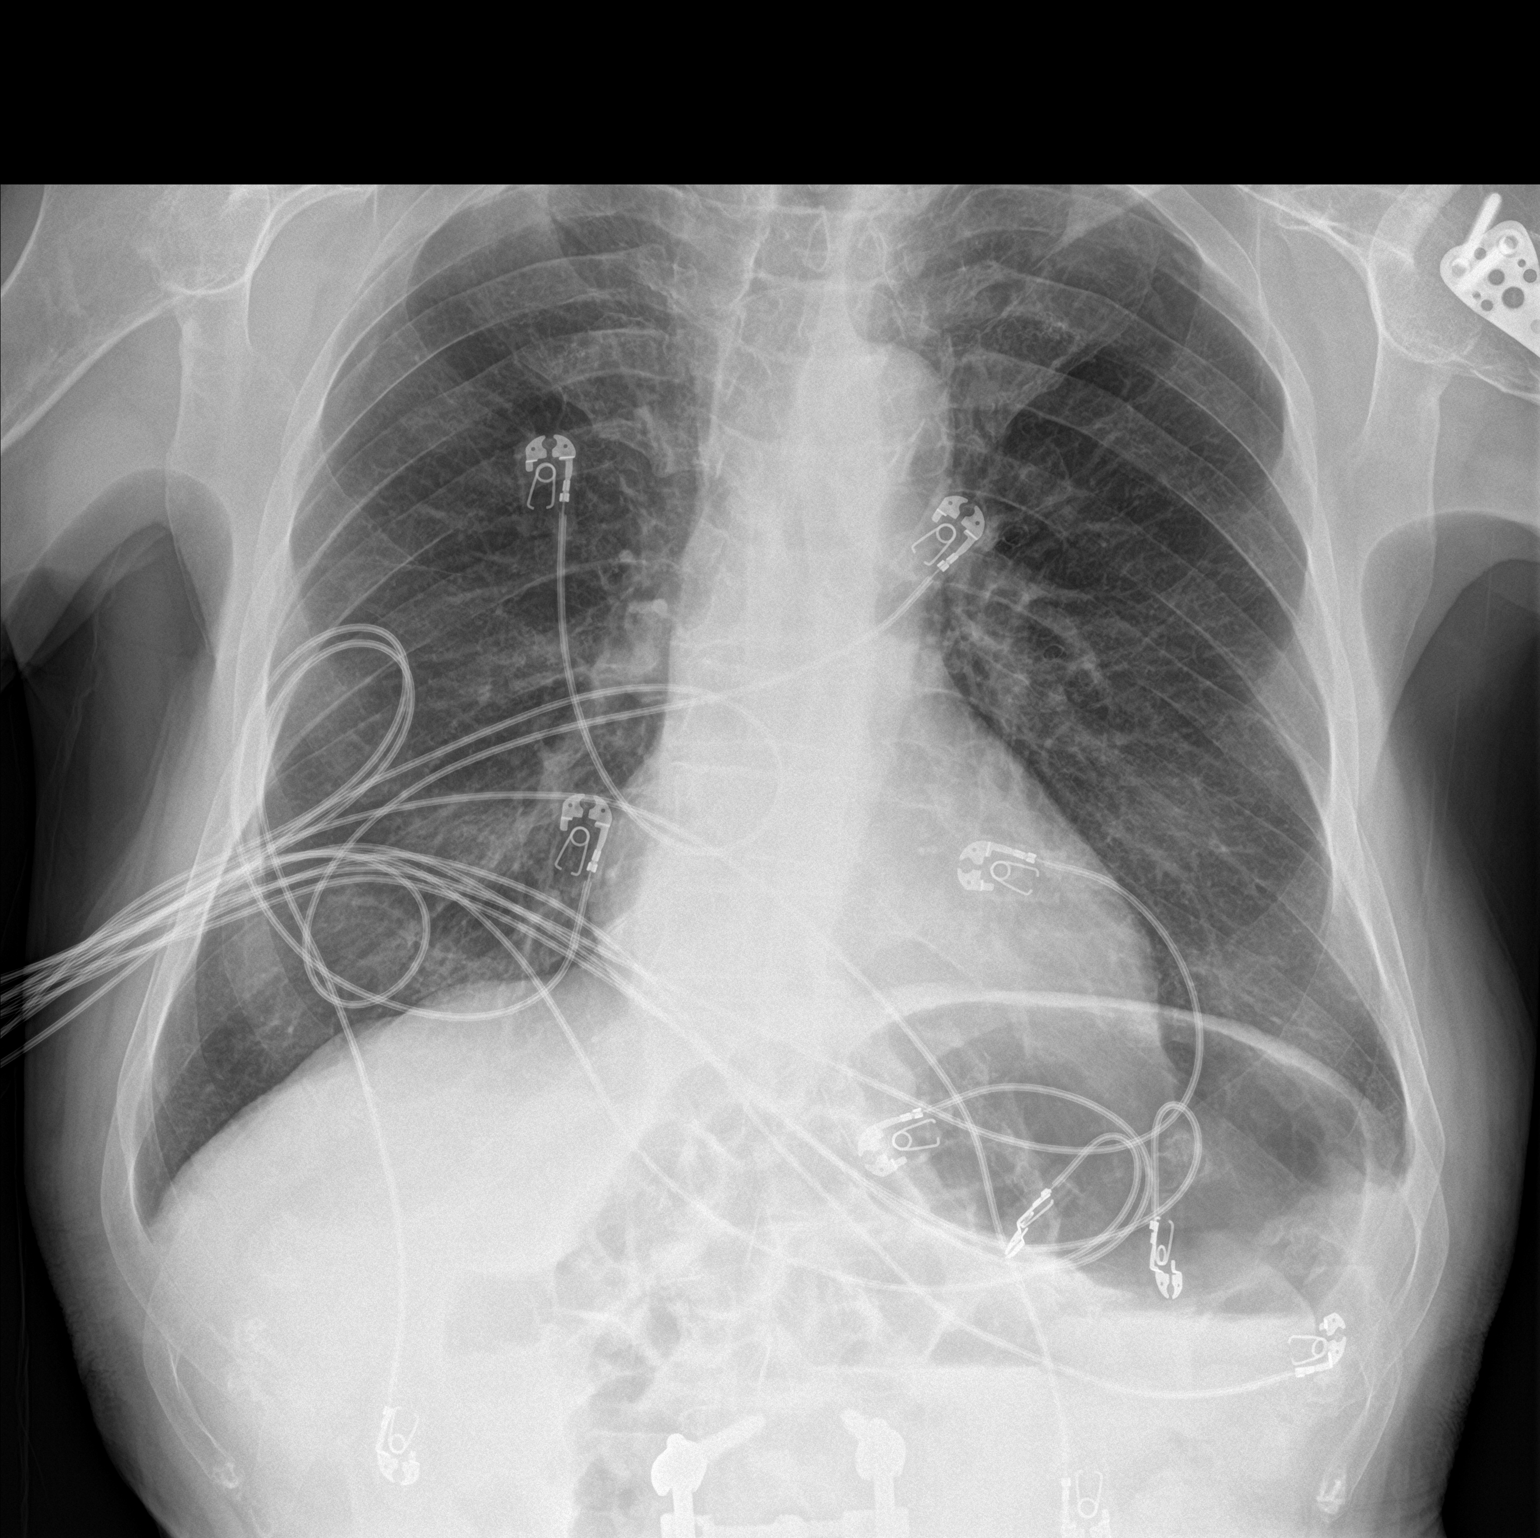

[chest lat]
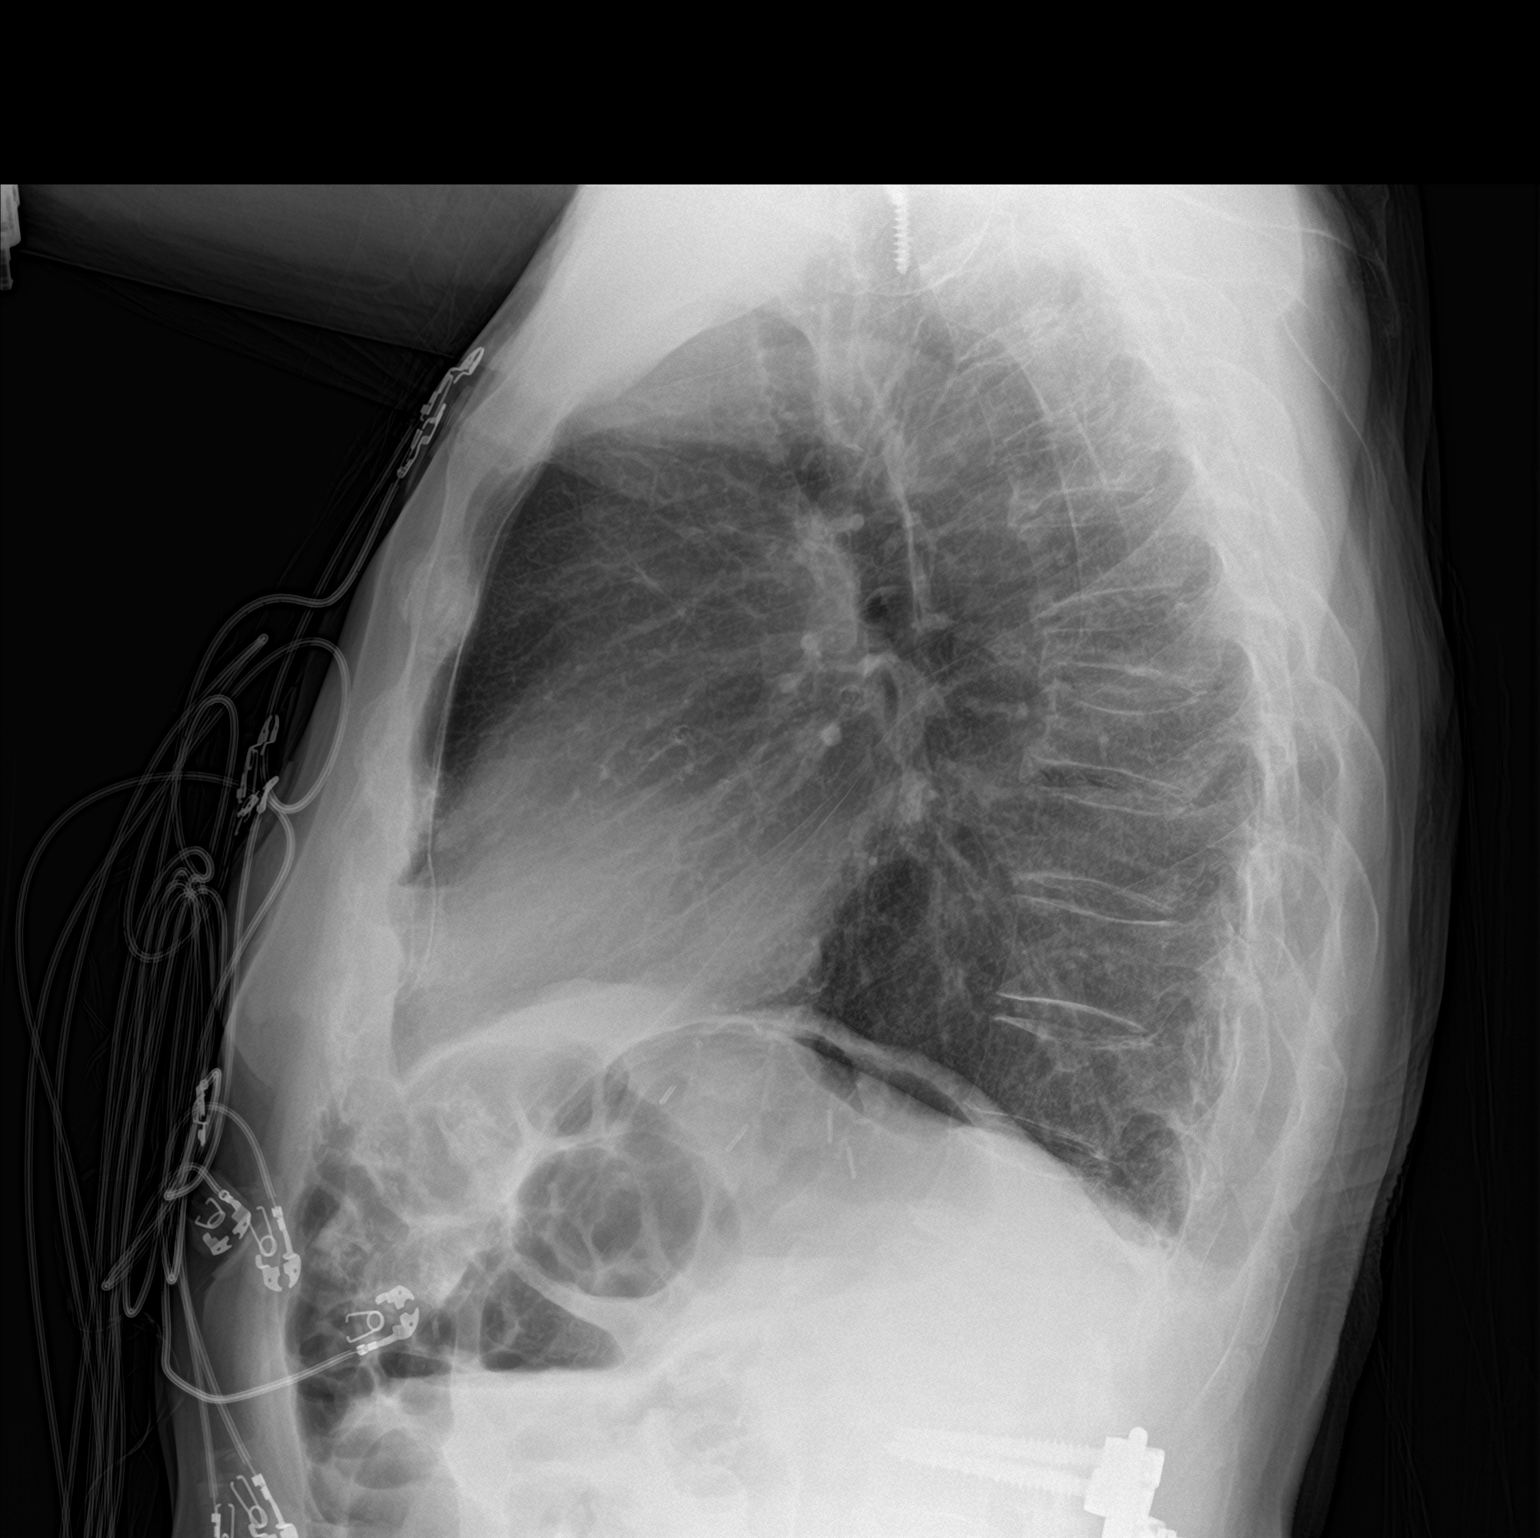

[2 of 2 positions shown; findings below may reference images not displayed]

FINDINGS: Chronic hyperinflation with flattening of the diaphragms. No
consolidation, features of edema, pneumothorax, or effusion. The
aorta is calcified. The remaining cardiomediastinal contours are
unremarkable. No acute osseous or soft tissue abnormality.
Degenerative changes are present in the imaged spine and shoulders.
Prior left humeral plate and screw fixation and thoracolumbar spinal
fusions are noted.
IMPRESSION: No acute cardiopulmonary abnormality.

Chronic hyperinflation.

## 2019-09-15 MED ORDER — FAMOTIDINE 20 MG PO TABS
20.0000 mg | ORAL_TABLET | Freq: Once | ORAL | Status: AC
  Administered 2019-09-15: 20 mg via ORAL
  Filled 2019-09-15: qty 1

## 2019-09-15 MED ORDER — ALUM & MAG HYDROXIDE-SIMETH 200-200-20 MG/5ML PO SUSP
30.0000 mL | Freq: Once | ORAL | Status: AC
  Administered 2019-09-15: 30 mL via ORAL
  Filled 2019-09-15: qty 30

## 2019-09-15 NOTE — Telephone Encounter (Signed)
Pyrtle pt calling stating he started having a grip and release pain in his chest last night. He also states he had a choking sensation in the area of his esophagus. Pt had some gas in that area and took some gas-x. He has had some of the same discomfort today. Pt states his BP is good, he has never had any cardiac chest pain before. Has history of Nissen fundoplication. As DOD please advise.

## 2019-09-15 NOTE — ED Triage Notes (Signed)
Pt reports central chest spasms since last night. States he talked to his GI doc today and they wanted him to have a cardiac rule out. Reports it feels like something is gripping his esophagus.

## 2019-09-15 NOTE — Discharge Instructions (Addendum)
It was our pleasure to provide your ER care today - we hope that you feel better.  Continue your antacid medication. You may also try maalox or gas-x as need for symptom relief.   Follow up with your doctor/gi doctor in the coming week - also have blood pressure rechecked then, as it is high today.   For chest discomfort, also follow up with cardiology in the next 1-2 weeks - call office Monday to arrange appointment.   Return to ER if worse, new symptoms, recurrent or persistent chest pain, trouble breathing, unable to swallow, or other concern.

## 2019-09-15 NOTE — Telephone Encounter (Signed)
Spoke with pt and he is aware of recommendations. 

## 2019-09-15 NOTE — ED Provider Notes (Addendum)
[name] Provider Note   CSN: [id number] Arrival date & time: 09/15/19  1814     History Chief Complaint  Patient presents with  . Chest Pain    Lucas Shepherd is a 86 y.o. male.  Patient c/o chest discomfort since last evening. Describes spasms of pain, mid chest, acute onset, constant, non radiating, without specific exacerbating or alleviating factors. Each individual spasm sensation lasts 1-2 seconds, occurs at rest. Denies any trouble swallowing. States has been doing his normally exercise/walking 3 miles, and states no cp or discomfort w exertion. No associated sob, nv or diaphroesis. No unusual doe or fatigue. No pleuritic pain. No abd pain. Denies leg pain or swelling. No cough or uri symptoms. No fever or chills.   The history is provided by the patient.  Chest Pain Associated symptoms: no abdominal pain, no back pain, no cough, no fever, no headache, no palpitations and no shortness of breath        Past Medical History:  Diagnosis Date  . Anemia   . Arthritis   . Blood transfusion   . BPH (benign prostatic hyperplasia)   . External hemorrhoids   . GERD (gastroesophageal reflux disease)    nisseum fundoplication [id number]  . GI bleed 12/12   got 3 units blood  . Hiatal hernia   . History of TMJ syndrome    mild  . History of urinary frequency   . Kidney stones    History of  . Osteoporosis   . Urinary urgency     Patient Active Problem List   Diagnosis Date Noted  . Status post laparoscopic fundoplication [id number]  . History of peptic ulcer 07/24/2011  . Esophageal stricture 06/26/2011  . H/O: upper GI bleed 06/26/2011  . Edema extremities 06/26/2011  . Chest pain 06/26/2011  . Anemia due to blood loss, acute 06/26/2011  . Syncope and collapse 06/11/2011  . Melena 06/11/2011  . Anemia 06/11/2011  . Azotemia 06/11/2011  . Volume depletion 06/11/2011  . Leukocytosis 06/11/2011  . DYSPHAGIA 08/02/2009  .  HIATAL HERNIA WITH REFLUX 01/30/2009  . VITAMIN B12 DEFICIENCY 01/01/2009  . BENIGN PROSTATIC HYPERTROPHY, WITH OBSTRUCTION 12/28/2008  . FECAL INCONTINENCE 12/28/2008  . GERD 12/27/2008  . DIVERTICULOSIS, COLON 12/27/2008  . RENAL CALCULUS 12/27/2008    Past Surgical History:  Procedure Laterality Date  . ABDOMINAL SURGERY  1985   tummy tuc  . CATARACT EXTRACTION Bilateral   . COLONOSCOPY    . ESOPHAGOGASTRODUODENOSCOPY  06/12/2011   Procedure: ESOPHAGOGASTRODUODENOSCOPY (EGD);  Surgeon: Scarlette Shorts, MD;  Location: [facility];  Service: Endoscopy;  Laterality: N/A;  . HARDWARE REMOVAL  02/23/2012   Procedure: HARDWARE REMOVAL;  Surgeon: Cammie Sickle., MD;  Location: [place];  Service: Orthopedics;  Laterality: Left;  biceps tenolysis and screw removal  . KNEE ARTHROSCOPY Left 7/13 2017  . NISSEN FUNDOPLICATION  [id number]  . ORIF HUMERUS FRACTURE  12/24/2011   Procedure: OPEN REDUCTION INTERNAL FIXATION (ORIF) PROXIMAL HUMERUS FRACTURE;  Surgeon: Cammie Sickle., MD;  Location: [facility];  Service: Orthopedics;  Laterality: Left;  ORIF left humerus  . SHOULDER ARTHROSCOPY Right 1954  . SHOULDER ARTHROSCOPY Left 05/28/2011   x 2  . spine fussion         Family History  Problem Relation Age of Onset  . Heart defect Father        aortic valve  . Nephrolithiasis Mother   . Lung cancer  Brother        smoker  . Colon cancer Neg Hx   . Esophageal cancer Neg Hx   . Stomach cancer Neg Hx   . Rectal cancer Neg Hx     Social History   Tobacco Use  . Smoking status: Former Smoker    Packs/day: 1.00    Years: 12.00    Pack years: 12.00    Types: Cigarettes    Quit date: 05/26/1966    Years since quitting: 53.3  . Smokeless tobacco: Never Used  Substance Use Topics  . Alcohol use: Yes    Alcohol/week: 1.0 standard drinks    Types: 1 Shots of liquor per week    Comment: red wine with dinner  . Drug use: No    Home  Medications Prior to Admission medications   Medication Sig Start Date End Date Taking? Authorizing Provider  b complex vitamins capsule Take 1 capsule by mouth daily.    [provider]  Calcium Carb-Cholecalciferol (CALCIUM 600 + D) 600-200 MG-UNIT TABS Take 1 tablet by mouth every morning.    [provider]  Coenzyme Q10 (COQ10) 200 MG CAPS Take 200 mg by mouth every morning.    [provider]  famotidine (PEPCID) 20 MG tablet TAKE 1 TABLET BY MOUTH EVERYDAY AT BEDTIME 08/28/19   Pyrtle, Lajuan Lines, MD  finasteride (PROSCAR) 5 MG tablet Take 5 mg by mouth at bedtime.    [provider]  glucosamine-chondroitin 500-400 MG tablet Take 1 tablet by mouth 2 (two) times daily.    [provider]  Methylsulfonylmethane (MSM) 1000 MG CAPS Take 1,000 mg by mouth 2 (two) times daily.    [provider]  Multiple Vitamins-Minerals (PRESERVISION AREDS) CAPS Take 1 capsule by mouth 2 (two) times daily.    [provider]  OVER THE COUNTER MEDICATION Take 1 capsule by mouth every morning. Beta Sitosterol capsule    [provider]  OVER THE COUNTER MEDICATION Take 1 capsule by mouth every morning. "Bladderwrack 14mcg iodine"    [provider]  Probiotic Product (ALIGN PO) Take by mouth.    [provider]  Teriparatide, Recombinant, (FORTEO) 600 MCG/2.4ML SOLN  03/17/18   [provider]  TURMERIC PO Take 150 mg by mouth 2 (two) times daily.    [provider]  vitamin B-12 (CYANOCOBALAMIN) 1000 MCG tablet Take 1,000 mcg by mouth every morning.    [provider]  Wheat Dextrin (BENEFIBER PO) Take by mouth 3 (three) times daily.    [provider]    Allergies    Patient has no known allergies.  Review of Systems   Review of Systems  Constitutional: Negative for fever.  HENT: Negative for sore throat.   Eyes: Negative for redness.  Respiratory: Negative for cough and shortness of  breath.   Cardiovascular: Positive for chest pain. Negative for palpitations and leg swelling.  Gastrointestinal: Negative for abdominal pain.  Genitourinary: Negative for flank pain.  Musculoskeletal: Negative for back pain and neck pain.  Skin: Negative for rash.  Neurological: Negative for headaches.  Hematological: Does not bruise/bleed easily.  Psychiatric/Behavioral: Negative for confusion.    Physical Exam Updated Vital Signs BP (!) 153/64 (BP Location: Right Arm)   Pulse 61   Temp 98.2 F (36.8 C) (Oral)   Resp 19   Ht 1.765 m (5' 9.5")   Wt 78.5 kg   SpO2 98%   BMI 25.18 kg/m   Physical Exam Vitals  and nursing note reviewed.  Constitutional:      Appearance: Normal appearance. He is well-developed.  HENT:     Head: Atraumatic.     Nose: Nose normal.     Mouth/Throat:     Mouth: Mucous membranes are moist.     Pharynx: Oropharynx is clear.  Eyes:     General: No scleral icterus.    Conjunctiva/sclera: Conjunctivae normal.  Neck:     Trachea: No tracheal deviation.  Cardiovascular:     Rate and Rhythm: Normal rate and regular rhythm.     Pulses: Normal pulses.     Heart sounds: Normal heart sounds. No murmur. No friction rub. No gallop.   Pulmonary:     Effort: Pulmonary effort is normal. No accessory muscle usage or respiratory distress.     Breath sounds: Normal breath sounds.  Chest:     Chest wall: No tenderness.  Abdominal:     General: Bowel sounds are normal. There is no distension.     Palpations: Abdomen is soft.     Tenderness: There is no abdominal tenderness. There is no guarding.  Genitourinary:    Comments: No cva tenderness. Musculoskeletal:        General: No swelling or tenderness.     Cervical back: Normal range of motion and neck supple. No rigidity.     Right lower leg: No edema.     Left lower leg: No edema.  Skin:    General: Skin is warm and dry.     Findings: No rash.  Neurological:     Mental Status: He is alert.      Comments: Alert, speech clear.   Psychiatric:        Mood and Affect: Mood normal.     ED Results / Procedures / Treatments   Labs (all labs ordered are listed, but only abnormal results are displayed) Results for orders placed or performed during the hospital encounter of [id number]  Basic metabolic panel  Result Value Ref Range   Sodium 139 135 - 145 mmol/L   Potassium 5.0 3.5 - 5.1 mmol/L   Chloride 103 98 - 111 mmol/L   CO2 25 22 - 32 mmol/L   Glucose, Bld 123 (H) 70 - 99 mg/dL   BUN 23 8 - 23 mg/dL   Creatinine, Ser 0.94 0.61 - 1.24 mg/dL   Calcium 10.0 8.9 - 10.3 mg/dL   GFR calc non Af Amer >60 >60 mL/min   GFR calc Af Amer >60 >60 mL/min   Anion gap 11 5 - 15  CBC  Result Value Ref Range   WBC 7.7 4.0 - 10.5 K/uL   RBC 4.34 4.22 - 5.81 MIL/uL   Hemoglobin 14.2 13.0 - 17.0 g/dL   HCT 42.1 39.0 - 52.0 %   MCV 97.0 80.0 - 100.0 fL   MCH 32.7 26.0 - 34.0 pg   MCHC 33.7 30.0 - 36.0 g/dL   RDW 12.8 11.5 - 15.5 %   Platelets 208 150 - 400 K/uL   nRBC 0.0 0.0 - 0.2 %  Troponin I (High Sensitivity)  Result Value Ref Range   Troponin I (High Sensitivity) 7 <18 ng/L  Troponin I (High Sensitivity)  Result Value Ref Range   Troponin I (High Sensitivity) 7 <18 ng/L   DG Chest 2 View  Result Date: 09/15/2019 CLINICAL DATA:  Chest pain EXAM: CHEST - 2 VIEW COMPARISON:  Radiograph 06/11/2011 FINDINGS: Chronic hyperinflation with flattening of the diaphragms. No consolidation, features of  edema, pneumothorax, or effusion. The aorta is calcified. The remaining cardiomediastinal contours are unremarkable. No acute osseous or soft tissue abnormality. Degenerative changes are present in the imaged spine and shoulders. Prior left humeral plate and screw fixation and thoracolumbar spinal fusions are noted. IMPRESSION: No acute cardiopulmonary abnormality. Chronic hyperinflation. Electronically Signed   By: Lovena Le M.D.   On: 09/15/2019 19:18    EKG EKG  Interpretation  Date/Time:  Friday September 15 2019 18:52:59 EST Ventricular Rate:  60 PR Interval:  150 QRS Duration: 85 QT Interval:  401 QTC Calculation: 401 R Axis:   69 Text Interpretation: Sinus rhythm Atrial premature complex Nonspecific ST abnormality Confirmed by Lajean Saver [phone]) on 09/15/2019 7:05:30 PM   Radiology DG Chest 2 View  Result Date: 09/15/2019 CLINICAL DATA:  Chest pain EXAM: CHEST - 2 VIEW COMPARISON:  Radiograph 06/11/2011 FINDINGS: Chronic hyperinflation with flattening of the diaphragms. No consolidation, features of edema, pneumothorax, or effusion. The aorta is calcified. The remaining cardiomediastinal contours are unremarkable. No acute osseous or soft tissue abnormality. Degenerative changes are present in the imaged spine and shoulders. Prior left humeral plate and screw fixation and thoracolumbar spinal fusions are noted. IMPRESSION: No acute cardiopulmonary abnormality. Chronic hyperinflation. Electronically Signed   By: Lovena Le M.D.   On: 09/15/2019 19:18    Procedures Procedures (including critical care time)  Medications Ordered in ED Medications - No data to display  ED Course  I have reviewed the triage vital signs and the nursing notes.  Pertinent labs & imaging results that were available during my care of the patient were reviewed by me and considered in my medical decision making (see chart for details).    MDM Rules/Calculators/A&P                      Iv ns. Continuous pulse ox and monitor. Stat labs, ecg, and imaging.  Reviewed nursing notes and prior charts for additional history.   CXR reviewed/interpreted by me - no pna.   Labs reviewed/interpreted by me - initial trop normal.   Pepcid and maalox po given for symptom relief.  Additional labs reviewed/interpreted by me - delta trop normal.  Recheck pt, symptoms resolved.  After symptoms for ~ 24 hours, initial and delta trop normal - felt not c/w acs.   Suspect gi  etiology symptoms.  Patient currently symptom free and appears stable for d/c.   Rec pcp f/u. Also, given atypical cp, will refer to outpt card f/u.  Return precautions provided.      Final Clinical Impression(s) / ED Diagnoses Final diagnoses:  None    Rx / DC Orders ED Discharge Orders    None            Lajean Saver, MD 09/15/19 2137

## 2019-09-15 NOTE — Telephone Encounter (Signed)
I do not know what that is from the sounds of it.  He was last seen in clinic Oct 2019 and I do not see any descriptions of such symptoms previously.  At his age, with new onset chest pain, my advice is to proceed to the emergency department.

## 2019-09-15 NOTE — ED Notes (Signed)
Discharge instructions discussed with Pt. Pt verbalized understanding. Pt stable and ambulatory.    

## 2019-09-18 MED ORDER — FAMOTIDINE 20 MG PO TABS: 20.0000 mg | ORAL_TABLET | Freq: Two times a day (BID) | ORAL | 3 refills | Status: DC

## 2019-09-18 NOTE — Telephone Encounter (Signed)
Spoke with pt and he is aware. Reports he is seeing Dr. Orland Mustard, he was concerned about an abnormal EKG and aorta calcification that he saw on his records from the ER. Pt would like to try the pepcid bid, new script sent to the pharmacy for him.

## 2019-09-18 NOTE — Addendum Note (Signed)
Addended by: Rosanne Sack R on: 09/18/2019 10:23 AM   Modules accepted: Orders

## 2019-09-18 NOTE — Telephone Encounter (Signed)
I see pt was seen in the ED and his symptoms were felt not consistent with ACS. Please check on him and offer followup if symptoms recur or persist I see famotidine 20 mg qhs on his list and this can be taken twice daily (roughly 12 hours apart) for more continuous acid control

## 2019-09-25 DIAGNOSIS — R079 Chest pain, unspecified: Secondary | ICD-10-CM | POA: Diagnosis not present

## 2019-09-25 DIAGNOSIS — K224 Dyskinesia of esophagus: Secondary | ICD-10-CM | POA: Diagnosis not present

## 2019-09-27 DIAGNOSIS — M4850XA Collapsed vertebra, not elsewhere classified, site unspecified, initial encounter for fracture: Secondary | ICD-10-CM | POA: Diagnosis not present

## 2019-09-27 DIAGNOSIS — M810 Age-related osteoporosis without current pathological fracture: Secondary | ICD-10-CM | POA: Diagnosis not present

## 2019-09-27 DIAGNOSIS — E559 Vitamin D deficiency, unspecified: Secondary | ICD-10-CM | POA: Diagnosis not present

## 2019-10-04 DIAGNOSIS — M4850XA Collapsed vertebra, not elsewhere classified, site unspecified, initial encounter for fracture: Secondary | ICD-10-CM | POA: Diagnosis not present

## 2019-10-04 DIAGNOSIS — M810 Age-related osteoporosis without current pathological fracture: Secondary | ICD-10-CM | POA: Diagnosis not present

## 2019-10-04 DIAGNOSIS — E559 Vitamin D deficiency, unspecified: Secondary | ICD-10-CM | POA: Diagnosis not present

## 2019-10-10 ENCOUNTER — Other Ambulatory Visit: Payer: Self-pay | Admitting: Endocrinology

## 2019-10-10 DIAGNOSIS — M810 Age-related osteoporosis without current pathological fracture: Secondary | ICD-10-CM

## 2019-10-12 DIAGNOSIS — M810 Age-related osteoporosis without current pathological fracture: Secondary | ICD-10-CM | POA: Diagnosis not present

## 2019-10-17 NOTE — Progress Notes (Signed)
Cardiology Office Note:   Date:  10/18/2019  NAME:  Lucas Shepherd    MRN: [id number] DOB:  Dec 01, 1928   PCP:  London Pepper, MD  Cardiologist:  No primary care provider on file.   Referring MD: London Pepper, MD   Chief Complaint  Patient presents with  . Chest Pain   History of Present Illness:   Lucas Shepherd is a 86 y.o. male with a hx of BPH, kidney stones who is being seen today for the evaluation of chest pain at the request of London Pepper, MD. Evaluated in the ER 09/15/2019 for atypical CP. Troponin negative x 2. CXR clear. EKG normal. Discharged with follow-up today. He reports he was seen in the emergency room for epigastric pain.  He reports he had a cramping sensation in his central abdomen.  The pain did not radiate into his arm or jaw.  It was not worsened by exertion.  He reports the pain just went away on its own.  He was treated with a GI cocktail in the emergency room.  He has had no further symptoms since that time.  I did review his records to the emergency room which shows a normal chest x-ray normal EKG and negative cardiac enzymes.  His EKG today is without acute ischemic changes.  There is likely a single PAC which is unremarkable.  He reports no palpitations.  He exercises daily.  He reports he walks 2 to 4 miles per day.  He has no limitations such as chest pain or shortness of breath.  He also plays golf quite frequently and has no limitations completing that task.  Aortic plaque was mentioned on chest x-ray.  I informed him this requires no specific treatment.  Most recent cholesterol profile shows a total cholesterol 188, HDL 63, LDL 111, triglycerides 82.  He does not take any statin medications.  Likely not necessary at his age.  Past Medical History: Past Medical History:  Diagnosis Date  . Anemia   . Arthritis   . Blood transfusion   . BPH (benign prostatic hyperplasia)   . External hemorrhoids   . GERD (gastroesophageal reflux disease)    nisseum  fundoplication [id number]  . GI bleed 12/12   got 3 units blood  . Hiatal hernia   . History of TMJ syndrome    mild  . History of urinary frequency   . Kidney stones    History of  . Osteoporosis   . Urinary urgency     Past Surgical History: Past Surgical History:  Procedure Laterality Date  . ABDOMINAL SURGERY  1985   tummy tuc  . CATARACT EXTRACTION Bilateral   . COLONOSCOPY    . ESOPHAGOGASTRODUODENOSCOPY  06/12/2011   Procedure: ESOPHAGOGASTRODUODENOSCOPY (EGD);  Surgeon: Scarlette Shorts, MD;  Location: [facility];  Service: Endoscopy;  Laterality: N/A;  . HARDWARE REMOVAL  02/23/2012   Procedure: HARDWARE REMOVAL;  Surgeon: Cammie Sickle., MD;  Location: [facility];  Service: Orthopedics;  Laterality: Left;  biceps tenolysis and screw removal  . KNEE ARTHROSCOPY Left 7/13 2017  . NISSEN FUNDOPLICATION  [id number]  . ORIF HUMERUS FRACTURE  12/24/2011   Procedure: OPEN REDUCTION INTERNAL FIXATION (ORIF) PROXIMAL HUMERUS FRACTURE;  Surgeon: Cammie Sickle., MD;  Location: [facility];  Service: Orthopedics;  Laterality: Left;  ORIF left humerus  . SHOULDER ARTHROSCOPY Right 1954  . SHOULDER ARTHROSCOPY Left 05/28/2011   x 2  . spine fussion  Current Medications: Current Meds  Medication Sig  . B Complex-Biotin-FA (B COMPLETE) TABS Take 0.5 tablets by mouth at bedtime.  . Calcium Carb-Cholecalciferol (CALCIUM + D3 PO) Take 1 tablet by mouth daily with breakfast.  . Coenzyme Q10 (COQ10) 200 MG CAPS Take 200 mg by mouth every morning.  . famotidine (PEPCID) 20 MG tablet TAKE 1 TABLET BY MOUTH EVERYDAY AT BEDTIME (Patient taking differently: Take 20 mg by mouth at bedtime. )  . famotidine (PEPCID) 20 MG tablet Take 1 tablet (20 mg total) by mouth 2 (two) times daily.  . finasteride (PROSCAR) 5 MG tablet Take 5 mg by mouth at bedtime.  . fluticasone (FLONASE) 50 MCG/ACT nasal spray Place 1 spray into both nostrils at bedtime.  Marland Kitchen FORTEO 600 MCG/2.4ML  SOPN Inject into the skin at bedtime.   Marland Kitchen glucosamine-chondroitin 500-400 MG tablet Take 1 tablet by mouth 2 (two) times daily.  . Methylcellulose, Laxative, (CITRUCEL FIBERSHAKE PO) Take 1 Scoop by mouth See admin instructions. Mix 1 scoopful into 8 ounces of water and drink every morning  . Methylsulfonylmethane (MSM) 1000 MG CAPS Take 1,000 mg by mouth 2 (two) times daily.  . Multiple Vitamins-Minerals (PRESERVISION AREDS) CAPS Take 1 capsule by mouth 2 (two) times daily.  Marland Kitchen OVER THE COUNTER MEDICATION Take 1 capsule by mouth See admin instructions. Beta-Sitosterol capsules- Take 1 capsule by mouth in the morning  . OVER THE COUNTER MEDICATION Take 1 capsule by mouth See admin instructions. Bladderwrack capsules- Take 1 capsule by mouth in the morning  . polyethylene glycol powder (GLYCOLAX/MIRALAX) 17 GM/SCOOP powder Take 17 g by mouth daily.  . TURMERIC PO Take 1 capsule by mouth daily.    Current Facility-Administered Medications for the 10/18/19 encounter (Office Visit) with Geralynn Rile, MD  Medication  . 0.9 %  sodium chloride infusion     Allergies:    Aspirin and Nsaids   Social History: Social History   Socioeconomic History  . Marital status: Married    Spouse name: Not on file  . Number of children: 1  . Years of education: Not on file  . Highest education level: Not on file  Occupational History  . Occupation: retired  Tobacco Use  . Smoking status: Former Smoker    Packs/day: 1.00    Years: 12.00    Pack years: 12.00    Types: Cigarettes    Quit date: 05/26/1966    Years since quitting: 53.4  . Smokeless tobacco: Never Used  Substance and Sexual Activity  . Alcohol use: Yes    Alcohol/week: 1.0 standard drinks    Types: 1 Shots of liquor per week    Comment: red wine with dinner  . Drug use: No  . Sexual activity: Never  Other Topics Concern  . Not on file  Social History Narrative  . Not on file   Social Determinants of Health   Financial  Resource Strain:   . Difficulty of Paying Living Expenses:   Food Insecurity:   . Worried About Charity fundraiser in the Last Year:   . Arboriculturist in the Last Year:   Transportation Needs:   . Film/video editor (Medical):   Marland Kitchen Lack of Transportation (Non-Medical):   Physical Activity:   . Days of Exercise per Week:   . Minutes of Exercise per Session:   Stress:   . Feeling of Stress :   Social Connections:   . Frequency of Communication with Friends and Family:   .  Frequency of Social Gatherings with Friends and Family:   . Attends Religious Services:   . Active Member of Clubs or Organizations:   . Attends Archivist Meetings:   Marland Kitchen Marital Status:      Family History: The patient's family history includes Heart defect in his father; Lung cancer in his brother; Nephrolithiasis in his mother. There is no history of Colon cancer, Esophageal cancer, Stomach cancer, or Rectal cancer.  ROS:   All other ROS reviewed and negative. Pertinent positives noted in the HPI.     EKGs/Labs/Other Studies Reviewed:   The following studies were personally reviewed by me today:  EKG:  EKG is ordered today.  The ekg ordered today demonstrates normal sinus rhythm, heart rate 61, single PAC noted, no acute ischemic changes, no evidence of prior infarction, and was personally reviewed by me.   Recent Labs: 09/15/2019: BUN 23; Creatinine, Ser 0.94; Hemoglobin 14.2; Platelets 208; Potassium 5.0; Sodium 139   Recent Lipid Panel No results found for: CHOL, TRIG, HDL, CHOLHDL, VLDL, LDLCALC, LDLDIRECT  Physical Exam:   VS:  BP (!) 118/52   Pulse 61   Ht 5' 9.75" (1.772 m)   Wt 179 lb 12.8 oz (81.6 kg)   BMI 25.98 kg/m    Wt Readings from Last 3 Encounters:  10/18/19 179 lb 12.8 oz (81.6 kg)  09/15/19 173 lb (78.5 kg)  05/11/18 174 lb (78.9 kg)    General: Well nourished, well developed, in no acute distress Heart: Atraumatic, normal size  Eyes: PEERLA, EOMI  Neck: Supple,  no JVD Endocrine: No thryomegaly Cardiac: Normal S1, S2; RRR; no murmurs, rubs, or gallops Lungs: Clear to auscultation bilaterally, no wheezing, rhonchi or rales  Abd: Soft, nontender, no hepatomegaly  Ext: No edema, pulses 2+ Musculoskeletal: No deformities, BUE and BLE strength normal and equal Skin: Warm and dry, no rashes   Neuro: Alert and oriented to person, place, time, and situation, CNII-XII grossly intact, no focal deficits  Psych: Normal mood and affect   ASSESSMENT:   Lucas Shepherd is a 86 y.o. male who presents for the following: 1. Chest pain, unspecified type     PLAN:   1. Chest pain, unspecified type -Follow-up from emergency room.  Had epigastric pain.  Has resolved.  EKG normal.  Cardiac enzymes negative and flat.  He exercises 2 to 4 miles per day without symptoms of chest pain or shortness of breath.  This is noncardiac chest pain.  I suspect this is likely GERD related or related to his prior Nissen fundoplication.  He has no cardiac murmurs on examination.  I see no need for further cardiac testing.  He will see [place] on an as-needed basis.  Disposition: Return if symptoms worsen or fail to improve.  Medication Adjustments/Labs and Tests Ordered: Current medicines are reviewed at length with the patient today.  Concerns regarding medicines are outlined above.  Orders Placed This Encounter  Procedures  . EKG 12-Lead   No orders of the defined types were placed in this encounter.   Patient Instructions  Medication Instructions:  The current medical regimen is effective;  continue present plan and medications.  *If you need a refill on your cardiac medications before your next appointment, please call your pharmacy*   Follow-Up: At [facility], you and your health needs are our priority.  As part of our continuing mission to provide you with exceptional heart care, we have created designated Provider Care Teams.  These Care Teams  include your primary  Cardiologist (physician) and Advanced Practice Providers (APPs -  Physician Assistants and Nurse Practitioners) who all work together to provide you with the care you need, when you need it.  We recommend signing up for the patient portal called "MyChart".  Sign up information is provided on this After Visit Summary.  MyChart is used to connect with patients for Virtual Visits (Telemedicine).  Patients are able to view lab/test results, encounter notes, upcoming appointments, etc.  Non-urgent messages can be sent to your provider as well.   To learn more about what you can do with MyChart, go to [email or web address].    Your next appointment:   As needed  The format for your next appointment:   Either In Person or Virtual  Provider:   Eleonore Chiquito, MD      Signed, Addison Naegeli. Audie Box, Walker  [address], [place], [place] [address] [phone]  10/18/2019 10:44 AM

## 2019-10-18 ENCOUNTER — Telehealth: Payer: Self-pay | Admitting: Cardiovascular Disease

## 2019-10-18 ENCOUNTER — Other Ambulatory Visit: Payer: Self-pay

## 2019-10-18 ENCOUNTER — Ambulatory Visit: Payer: Medicare PPO | Admitting: Cardiovascular Disease

## 2019-10-18 ENCOUNTER — Encounter: Payer: Self-pay | Admitting: Cardiovascular Disease

## 2019-10-18 VITALS — BP 118/52 | HR 61 | Ht 69.75 in | Wt 179.8 lb

## 2019-10-18 DIAGNOSIS — R079 Chest pain, unspecified: Secondary | ICD-10-CM | POA: Diagnosis not present

## 2019-10-18 NOTE — Telephone Encounter (Signed)
Attempted to reach the patient but there was no answer. The duplicate medication has been taken off of the list per the patient request.

## 2019-10-18 NOTE — Telephone Encounter (Signed)
New message      Pt c/o medication issue:  1. Name of Medication: famotidine  2. How are you currently taking this medication (dosage and times per day)? 20mg  twice a day  3. Are you having a reaction (difficulty breathing--STAT)? no  4. What is your medication issue? Patient said in mychart this prescription is on his med list twice.  He takes this medication twice a day and not once a day.  He want the once a day removed so that when he need a refill, there will not be a problem.

## 2019-10-18 NOTE — Patient Instructions (Signed)
Medication Instructions:  The current medical regimen is effective;  continue present plan and medications.  *If you need a refill on your cardiac medications before your next appointment, please call your pharmacy*   Follow-Up: At [facility], you and your health needs are our priority.  As part of our continuing mission to provide you with exceptional heart care, we have created designated Provider Care Teams.  These Care Teams include your primary Cardiologist (physician) and Advanced Practice Providers (APPs -  Physician Assistants and Nurse Practitioners) who all work together to provide you with the care you need, when you need it.  We recommend signing up for the patient portal called "MyChart".  Sign up information is provided on this After Visit Summary.  MyChart is used to connect with patients for Virtual Visits (Telemedicine).  Patients are able to view lab/test results, encounter notes, upcoming appointments, etc.  Non-urgent messages can be sent to your provider as well.   To learn more about what you can do with MyChart, go to [email or web address]://www.mychart.com.    Your next appointment:   As needed  The format for your next appointment:   Either In Person or Virtual  Provider:   [place]irt O'Neal, MD      

## 2019-11-01 DIAGNOSIS — M1712 Unilateral primary osteoarthritis, left knee: Secondary | ICD-10-CM | POA: Diagnosis not present

## 2019-11-01 DIAGNOSIS — M25562 Pain in left knee: Secondary | ICD-10-CM | POA: Diagnosis not present

## 2019-11-08 DIAGNOSIS — M810 Age-related osteoporosis without current pathological fracture: Secondary | ICD-10-CM | POA: Diagnosis not present

## 2019-11-08 DIAGNOSIS — D485 Neoplasm of uncertain behavior of skin: Secondary | ICD-10-CM | POA: Diagnosis not present

## 2019-11-08 DIAGNOSIS — L821 Other seborrheic keratosis: Secondary | ICD-10-CM | POA: Diagnosis not present

## 2019-11-08 DIAGNOSIS — Z85828 Personal history of other malignant neoplasm of skin: Secondary | ICD-10-CM | POA: Diagnosis not present

## 2019-11-08 DIAGNOSIS — L570 Actinic keratosis: Secondary | ICD-10-CM | POA: Diagnosis not present

## 2019-11-08 DIAGNOSIS — C44329 Squamous cell carcinoma of skin of other parts of face: Secondary | ICD-10-CM | POA: Diagnosis not present

## 2019-11-13 NOTE — Telephone Encounter (Signed)
Opened in error

## 2019-12-20 DIAGNOSIS — N3000 Acute cystitis without hematuria: Secondary | ICD-10-CM | POA: Diagnosis not present

## 2019-12-26 DIAGNOSIS — H43393 Other vitreous opacities, bilateral: Secondary | ICD-10-CM | POA: Diagnosis not present

## 2019-12-26 DIAGNOSIS — Z961 Presence of intraocular lens: Secondary | ICD-10-CM | POA: Diagnosis not present

## 2019-12-26 DIAGNOSIS — H35373 Puckering of macula, bilateral: Secondary | ICD-10-CM | POA: Diagnosis not present

## 2019-12-26 DIAGNOSIS — Z9841 Cataract extraction status, right eye: Secondary | ICD-10-CM | POA: Diagnosis not present

## 2019-12-26 DIAGNOSIS — H353130 Nonexudative age-related macular degeneration, bilateral, stage unspecified: Secondary | ICD-10-CM | POA: Diagnosis not present

## 2020-01-01 ENCOUNTER — Other Ambulatory Visit: Payer: Self-pay | Admitting: Internal Medicine

## 2020-01-02 ENCOUNTER — Telehealth: Payer: Self-pay | Admitting: Internal Medicine

## 2020-01-02 MED ORDER — FAMOTIDINE 20 MG PO TABS: 20.0000 mg | ORAL_TABLET | Freq: Two times a day (BID) | ORAL | 0 refills | Status: DC

## 2020-01-02 NOTE — Telephone Encounter (Signed)
Pt is requesting a refill on his famotidine. Pt is scheduled for an appt with Ellouise Newer for 7/28.

## 2020-01-02 NOTE — Telephone Encounter (Signed)
Rx sent until appointment on 02/07/20

## 2020-01-20 DIAGNOSIS — M25559 Pain in unspecified hip: Secondary | ICD-10-CM | POA: Diagnosis not present

## 2020-01-20 DIAGNOSIS — M25551 Pain in right hip: Secondary | ICD-10-CM | POA: Diagnosis not present

## 2020-02-06 DIAGNOSIS — M5416 Radiculopathy, lumbar region: Secondary | ICD-10-CM | POA: Diagnosis not present

## 2020-02-07 ENCOUNTER — Ambulatory Visit: Payer: Medicare PPO | Admitting: Physician Assistant

## 2020-02-28 DIAGNOSIS — M4316 Spondylolisthesis, lumbar region: Secondary | ICD-10-CM | POA: Diagnosis not present

## 2020-02-28 DIAGNOSIS — M5116 Intervertebral disc disorders with radiculopathy, lumbar region: Secondary | ICD-10-CM | POA: Diagnosis not present

## 2020-02-28 DIAGNOSIS — M48061 Spinal stenosis, lumbar region without neurogenic claudication: Secondary | ICD-10-CM | POA: Diagnosis not present

## 2020-02-28 DIAGNOSIS — M4727 Other spondylosis with radiculopathy, lumbosacral region: Secondary | ICD-10-CM | POA: Diagnosis not present

## 2020-02-28 DIAGNOSIS — M4726 Other spondylosis with radiculopathy, lumbar region: Secondary | ICD-10-CM | POA: Diagnosis not present

## 2020-02-28 DIAGNOSIS — M4807 Spinal stenosis, lumbosacral region: Secondary | ICD-10-CM | POA: Diagnosis not present

## 2020-02-28 DIAGNOSIS — Z981 Arthrodesis status: Secondary | ICD-10-CM | POA: Diagnosis not present

## 2020-03-15 ENCOUNTER — Other Ambulatory Visit: Payer: Self-pay | Admitting: Internal Medicine

## 2020-03-15 DIAGNOSIS — N189 Chronic kidney disease, unspecified: Secondary | ICD-10-CM | POA: Diagnosis not present

## 2020-03-15 DIAGNOSIS — Z981 Arthrodesis status: Secondary | ICD-10-CM | POA: Diagnosis not present

## 2020-03-15 DIAGNOSIS — N429 Disorder of prostate, unspecified: Secondary | ICD-10-CM | POA: Diagnosis not present

## 2020-03-15 DIAGNOSIS — Z8711 Personal history of peptic ulcer disease: Secondary | ICD-10-CM | POA: Diagnosis not present

## 2020-03-15 DIAGNOSIS — M5136 Other intervertebral disc degeneration, lumbar region: Secondary | ICD-10-CM | POA: Diagnosis not present

## 2020-03-15 DIAGNOSIS — H269 Unspecified cataract: Secondary | ICD-10-CM | POA: Diagnosis not present

## 2020-03-15 DIAGNOSIS — M47816 Spondylosis without myelopathy or radiculopathy, lumbar region: Secondary | ICD-10-CM | POA: Diagnosis not present

## 2020-03-15 DIAGNOSIS — M48061 Spinal stenosis, lumbar region without neurogenic claudication: Secondary | ICD-10-CM | POA: Diagnosis not present

## 2020-03-15 DIAGNOSIS — Z5189 Encounter for other specified aftercare: Secondary | ICD-10-CM | POA: Diagnosis not present

## 2020-03-15 DIAGNOSIS — M48062 Spinal stenosis, lumbar region with neurogenic claudication: Secondary | ICD-10-CM | POA: Diagnosis not present

## 2020-03-15 DIAGNOSIS — M7071 Other bursitis of hip, right hip: Secondary | ICD-10-CM | POA: Diagnosis not present

## 2020-04-11 ENCOUNTER — Other Ambulatory Visit: Payer: Self-pay | Admitting: Internal Medicine

## 2020-04-12 DIAGNOSIS — M25551 Pain in right hip: Secondary | ICD-10-CM | POA: Diagnosis not present

## 2020-04-12 DIAGNOSIS — M7061 Trochanteric bursitis, right hip: Secondary | ICD-10-CM | POA: Diagnosis not present

## 2020-04-24 DIAGNOSIS — D1801 Hemangioma of skin and subcutaneous tissue: Secondary | ICD-10-CM | POA: Diagnosis not present

## 2020-04-24 DIAGNOSIS — D225 Melanocytic nevi of trunk: Secondary | ICD-10-CM | POA: Diagnosis not present

## 2020-04-24 DIAGNOSIS — L812 Freckles: Secondary | ICD-10-CM | POA: Diagnosis not present

## 2020-04-24 DIAGNOSIS — L821 Other seborrheic keratosis: Secondary | ICD-10-CM | POA: Diagnosis not present

## 2020-04-24 DIAGNOSIS — L570 Actinic keratosis: Secondary | ICD-10-CM | POA: Diagnosis not present

## 2020-04-24 DIAGNOSIS — Z85828 Personal history of other malignant neoplasm of skin: Secondary | ICD-10-CM | POA: Diagnosis not present

## 2020-05-07 ENCOUNTER — Other Ambulatory Visit: Payer: Self-pay | Admitting: Internal Medicine

## 2020-05-15 DIAGNOSIS — M810 Age-related osteoporosis without current pathological fracture: Secondary | ICD-10-CM | POA: Diagnosis not present

## 2020-05-17 DIAGNOSIS — M1611 Unilateral primary osteoarthritis, right hip: Secondary | ICD-10-CM | POA: Diagnosis not present

## 2020-05-17 DIAGNOSIS — M7061 Trochanteric bursitis, right hip: Secondary | ICD-10-CM | POA: Diagnosis not present

## 2020-05-17 DIAGNOSIS — M7071 Other bursitis of hip, right hip: Secondary | ICD-10-CM | POA: Diagnosis not present

## 2020-05-24 ENCOUNTER — Ambulatory Visit: Payer: Medicare PPO | Admitting: Internal Medicine

## 2020-05-24 ENCOUNTER — Encounter: Payer: Self-pay | Admitting: Internal Medicine

## 2020-05-24 VITALS — BP 120/66 | HR 68 | Ht 69.0 in | Wt 181.0 lb

## 2020-05-24 DIAGNOSIS — R151 Fecal smearing: Secondary | ICD-10-CM

## 2020-05-24 DIAGNOSIS — K219 Gastro-esophageal reflux disease without esophagitis: Secondary | ICD-10-CM

## 2020-05-24 DIAGNOSIS — K5909 Other constipation: Secondary | ICD-10-CM | POA: Diagnosis not present

## 2020-05-24 NOTE — Patient Instructions (Addendum)
Continue famotidine twice daily.  Continue citrucel.  Continue Miralax.  Please follow up with Dr Hilarie Fredrickson in 1-2 years.  If you are age 84 or older, your body mass index should be between 23-30. Your Body mass index is 26.73 kg/m. If this is out of the aforementioned range listed, please consider follow up with your Primary Care Provider.  Due to recent changes in healthcare laws, you may see the results of your imaging and laboratory studies on MyChart before your provider has had a chance to review them.  We understand that in some cases there may be results that are confusing or concerning to you. Not all laboratory results come back in the same time frame and the provider may be waiting for multiple results in order to interpret others.  Please give [place] 48 hours in order for your provider to thoroughly review all the results before contacting the office for clarification of your results.

## 2020-05-24 NOTE — Progress Notes (Signed)
   Subjective:    Patient ID: Lucas Shepherd, male    DOB: [birth year], 86 y.o.   MRN: [id number]  HPI Lucas Shepherd is an 86 year old male with a past medical history of adenomatous colon polyps, internal hemorrhoids with prior banding, fecal smearing, constipation, GERD with prior Nissen fundoplication who is here for follow-up. He is here alone today and was last seen 2 years ago.  He reports he has been doing very well. His incomplete defecation, fecal smearing and abdominal pressure has resolved completely with daily Citrucel plus MiraLAX. He is now having very good incomplete bowel movements which are spontaneous. Loose stools have resolved. No hard stools. Fecal smearing is 95% better. No abdominal pain. Good appetite. No nausea or vomiting. He does have gas at times and takes Gas-X but it does not help much. He is taking famotidine 20 mg twice daily and since increasing to twice daily heartburn is well controlled. No dysphagia or odynophagia.  Last colonoscopy was June 2018 revealing a small adenoma in the ascending colon.   Review of Systems As per HPI, otherwise negative  Current Medications, Allergies, Past Medical History, Past Surgical History, Family History and Social History were reviewed in [employer] record.     Objective:   Physical Exam BP 120/66   Pulse 68   Ht 5\' 9"  (1.753 m)   Wt 181 lb (82.1 kg)   BMI 26.73 kg/m  Gen: awake, alert, NAD HEENT: anicteric CV: RRR, no mrg Pulm: CTA b/l Abd: soft, NT/ND, +BS throughout Ext: no c/c/e Neuro: nonfocal     Assessment & Plan:  86 year old male with a past medical history of adenomatous colon polyps, internal hemorrhoids with prior banding, fecal smearing, constipation, GERD with prior Nissen fundoplication who is here for follow-up.  1. Chronic constipation fecal smearing --prior banding which was not very beneficial but the combination of Citrucel and MiraLAX has resulted in excellent  control of symptoms. Bowel movements have been normal for him without fecal smearing, bleeding, constipation or diarrhea. He is very happy with current therapy --Continue MiraLAX and Citrucel daily  2. GERD --prior Nissen fundoplication but requiring antacids. Currently well controlled heartburn symptoms without alarm symptoms --We will continue famotidine 20 mg twice daily  3. History of colon polyps --last colonoscopy with one small nonadvanced adenoma in 2018. Surveillance would not be recommended prior to 7 years though this will likely be deferred due to age.  He can follow-up in 1 to 2 years, sooner if needed  20 minutes total spent today including patient facing time, coordination of care, reviewing medical history/procedures/pertinent radiology studies, and documentation of the encounter.

## 2020-05-27 DIAGNOSIS — M25551 Pain in right hip: Secondary | ICD-10-CM | POA: Diagnosis not present

## 2020-05-29 DIAGNOSIS — N401 Enlarged prostate with lower urinary tract symptoms: Secondary | ICD-10-CM | POA: Diagnosis not present

## 2020-05-29 DIAGNOSIS — R351 Nocturia: Secondary | ICD-10-CM | POA: Diagnosis not present

## 2020-05-30 ENCOUNTER — Other Ambulatory Visit: Payer: Self-pay | Admitting: Internal Medicine

## 2020-06-14 DIAGNOSIS — M25559 Pain in unspecified hip: Secondary | ICD-10-CM | POA: Diagnosis not present

## 2020-06-19 DIAGNOSIS — H9201 Otalgia, right ear: Secondary | ICD-10-CM | POA: Diagnosis not present

## 2020-06-19 DIAGNOSIS — M25559 Pain in unspecified hip: Secondary | ICD-10-CM | POA: Diagnosis not present

## 2020-06-19 DIAGNOSIS — R509 Fever, unspecified: Secondary | ICD-10-CM | POA: Diagnosis not present

## 2020-06-26 DIAGNOSIS — H35373 Puckering of macula, bilateral: Secondary | ICD-10-CM | POA: Diagnosis not present

## 2020-06-26 DIAGNOSIS — H43393 Other vitreous opacities, bilateral: Secondary | ICD-10-CM | POA: Diagnosis not present

## 2020-06-26 DIAGNOSIS — Z961 Presence of intraocular lens: Secondary | ICD-10-CM | POA: Diagnosis not present

## 2020-06-26 DIAGNOSIS — H35363 Drusen (degenerative) of macula, bilateral: Secondary | ICD-10-CM | POA: Diagnosis not present

## 2020-06-26 DIAGNOSIS — H353130 Nonexudative age-related macular degeneration, bilateral, stage unspecified: Secondary | ICD-10-CM | POA: Diagnosis not present

## 2020-06-29 ENCOUNTER — Encounter: Payer: Self-pay | Admitting: Orthopaedic Surgery

## 2020-07-01 NOTE — Telephone Encounter (Signed)
Holding for LG for patient's appt

## 2020-07-03 ENCOUNTER — Ambulatory Visit (INDEPENDENT_AMBULATORY_CARE_PROVIDER_SITE_OTHER): Payer: Medicare PPO

## 2020-07-03 ENCOUNTER — Encounter: Payer: Self-pay | Admitting: Orthopaedic Surgery

## 2020-07-03 ENCOUNTER — Other Ambulatory Visit: Payer: Self-pay

## 2020-07-03 ENCOUNTER — Ambulatory Visit: Payer: Medicare PPO | Admitting: Orthopaedic Surgery

## 2020-07-03 ENCOUNTER — Ambulatory Visit: Payer: Self-pay

## 2020-07-03 VITALS — Ht 69.5 in | Wt 182.0 lb

## 2020-07-03 DIAGNOSIS — M25551 Pain in right hip: Secondary | ICD-10-CM

## 2020-07-03 DIAGNOSIS — M545 Low back pain, unspecified: Secondary | ICD-10-CM | POA: Diagnosis not present

## 2020-07-03 DIAGNOSIS — G8929 Other chronic pain: Secondary | ICD-10-CM

## 2020-07-03 MED ORDER — TRAMADOL HCL 50 MG PO TABS: 50.0000 mg | ORAL_TABLET | Freq: Two times a day (BID) | ORAL | 0 refills | Status: DC | PRN

## 2020-07-03 NOTE — Progress Notes (Signed)
Office Visit Note   Patient: Lucas Shepherd           Date of Birth: 1928/10/14           MRN: [id number] Visit Date: 07/03/2020              Requested by: London Pepper, MD [address] [place],  [place] [address] PCP: London Pepper, MD   Assessment & Plan: Visit Diagnoses:  1. Pain in right hip   2. Chronic right-sided low back pain without sciatica     Plan: Mr. Baloun has a chronic history of problems referable to his back and right hip.  In 2017 he had surgery with fusion at L2-3 performed by a surgeon at [facility]  He had immediate relief of that pain.  Over the years he is developed some pain in his back sacroiliac region and right hip.  He has been to [facility] and recently [facility].  He had an MRI scan of his pelvis but does not know the results and we could not retrieve the results.  He has had a number of injections in the area of his right hip which did not provide much relief.  He is also had an 18-day course of prednisone that did not help.  He takes tramadol once or twice a day to help him get through the day and sleep.  He describes the pain as being the lower back and along the lumbosacral junction with referred pain over to the lateral aspect of his right hip.  He has trouble sleeping.  I obtain films of his lumbar spine and he certainly has a good fusion at L2-3 but significant degenerative disc disease at L3-4, L4-5 and L5-S1.  I suspect a lot of the problem is experiencing is probably related to his back.  Limited views of his hips did not demonstrate any obvious arthritis or acute changes.  I am at somewhat of a disadvantage because I do not have the results of any of his studies.  He has had an MRI scan of his back about 5 to 6 months ago at [facility] and more recently an MRI of his pelvis.  I have asked him to retrieve those records.  Based on his exam and history today IP might be a good candidate for an epidural steroid  injection both as a diagnostic and therapeutic tool.  His spine surgeon the [facility] had made an appointment to see an orthopedist at [facility] but notes that he could not see them till May and just did not feel like he could wait because he is so uncomfortable.  I suggested he still keep that appointment and may be get on a waiting list.  Nonetheless he is still going to need to retrieve all of his records from [place] and then return.  I will renew his tramadol  Follow-Up Instructions: Return if symptoms worsen or fail to improve.   Orders:  Orders Placed This Encounter  Procedures  . XR Lumbar Spine 2-3 Views   No orders of the defined types were placed in this encounter.     Procedures: No procedures performed   Clinical Data: No additional findings.   Subjective: Chief Complaint  Patient presents with  . Right Hip - Pain  Patient presents today for right hip pain. He said that his right hip has hurt for two years, but has really worsened in the last 8 months. No known injury. He has pain  in his buttock and in the joint. No groin pain. His pain is constant, but worse with weightbearing. He has been treated for this at [facility]. He has tried injections, physical therapy, and oral prednisone. He takes Tramadol at night to help him sleep. He cannot get comfortable. He has a history of lower back surgery and feels like some of his pain be coming from his back. He cannot put weight on his right leg without extreme pain. He states that he has had x-rays and an MRI in the past on his hip at [facility].  HPI  Review of Systems   Objective: Vital Signs: Ht 5' 9.5" (1.765 m)   Wt 182 lb (82.6 kg)   BMI 26.49 kg/m   Physical Exam Constitutional:      Appearance: He is well-developed and well-nourished.  HENT:     Mouth/Throat:     Mouth: Oropharynx is clear and moist.  Eyes:     Extraocular Movements: EOM normal.     Pupils: Pupils are equal, round, and reactive to light.  Pulmonary:      Effort: Pulmonary effort is normal.  Skin:    General: Skin is warm and dry.  Neurological:     Mental Status: He is alert and oriented to person, place, and time.  Psychiatric:        Mood and Affect: Mood and affect normal.        Behavior: Behavior normal.     Ortho Exam awake alert and oriented x3.  Comfortable sitting.  No acute distress.  Does have a limp referable to his right side either from his lumbar spine or his hip.  He had no pain with range of motion of his right hip no loss of motion.  Straight leg raise is negative.  He did have some areas of tenderness in the area of his buttock lumbosacral spine and lateral hip.  No masses.  Some mild percussible tenderness of the lower lumbar spine  Specialty Comments:  No specialty comments available.  Imaging: XR Lumbar Spine 2-3 Views  Result Date: 07/03/2020 Films of the lumbar spine obtained in 2 projections.  There is been a prior fusion of L2-3 with instrumentation in good position.  There is near complete collapse of the L3-4, L4-5 and L5-S1 disc spaces with reversal of the normal lordosis.  There are facet changes at the lower 3 levels as well.  No acute changes    PMFS History: Patient Active Problem List   Diagnosis Date Noted  . Pain in right hip 07/03/2020  . Low back pain 07/03/2020  . Status post laparoscopic fundoplication [id number]  . History of peptic ulcer 07/24/2011  . Esophageal stricture 06/26/2011  . H/O: upper GI bleed 06/26/2011  . Edema extremities 06/26/2011  . Chest pain 06/26/2011  . Anemia due to blood loss, acute 06/26/2011  . Syncope and collapse 06/11/2011  . Melena 06/11/2011  . Anemia 06/11/2011  . Azotemia 06/11/2011  . Volume depletion 06/11/2011  . Leukocytosis 06/11/2011  . DYSPHAGIA 08/02/2009  . HIATAL HERNIA WITH REFLUX 01/30/2009  . VITAMIN B12 DEFICIENCY 01/01/2009  . BENIGN PROSTATIC HYPERTROPHY, WITH OBSTRUCTION 12/28/2008  . FECAL INCONTINENCE 12/28/2008  . GERD  12/27/2008  . DIVERTICULOSIS, COLON 12/27/2008  . RENAL CALCULUS 12/27/2008   Past Medical History:  Diagnosis Date  . Anemia   . Arthritis   . Blood transfusion   . BPH (benign prostatic hyperplasia)   . External hemorrhoids   . GERD (gastroesophageal reflux  disease)    nisseum fundoplication [id number]  . GI bleed 12/12   got 3 units blood  . Hiatal hernia   . History of TMJ syndrome    mild  . History of urinary frequency   . Kidney stones    History of  . Osteoporosis   . Urinary urgency     Family History  Problem Relation Age of Onset  . Heart defect Father        aortic valve  . Nephrolithiasis Mother   . Lung cancer Brother        smoker  . Colon cancer Neg Hx   . Esophageal cancer Neg Hx   . Stomach cancer Neg Hx   . Rectal cancer Neg Hx     Past Surgical History:  Procedure Laterality Date  . ABDOMINAL SURGERY  1985   tummy tuc  . CATARACT EXTRACTION Bilateral   . COLONOSCOPY    . ESOPHAGOGASTRODUODENOSCOPY  06/12/2011   Procedure: ESOPHAGOGASTRODUODENOSCOPY (EGD);  Surgeon: Scarlette Shorts, MD;  Location: [facility];  Service: Endoscopy;  Laterality: N/A;  . HARDWARE REMOVAL  02/23/2012   Procedure: HARDWARE REMOVAL;  Surgeon: Cammie Sickle., MD;  Location: [facility];  Service: Orthopedics;  Laterality: Left;  biceps tenolysis and screw removal  . KNEE ARTHROSCOPY Left 7/13 2017  . NISSEN FUNDOPLICATION  [id number]  . ORIF HUMERUS FRACTURE  12/24/2011   Procedure: OPEN REDUCTION INTERNAL FIXATION (ORIF) PROXIMAL HUMERUS FRACTURE;  Surgeon: Cammie Sickle., MD;  Location: [facility];  Service: Orthopedics;  Laterality: Left;  ORIF left humerus  . SHOULDER ARTHROSCOPY Right 1954  . SHOULDER ARTHROSCOPY Left 05/28/2011   x 2  . spine fussion     Social History   Occupational History  . Occupation: retired  Tobacco Use  . Smoking status: Former Smoker    Packs/day: 1.00    Years: 12.00    Pack years: 12.00    Types:  Cigarettes    Quit date: 05/26/1966    Years since quitting: 54.1  . Smokeless tobacco: Never Used  Vaping Use  . Vaping Use: Never used  Substance and Sexual Activity  . Alcohol use: Yes    Alcohol/week: 1.0 standard drink    Types: 1 Shots of liquor per week    Comment: red wine with dinner  . Drug use: No  . Sexual activity: Never

## 2020-07-04 ENCOUNTER — Other Ambulatory Visit: Payer: Self-pay

## 2020-07-04 DIAGNOSIS — M25551 Pain in right hip: Secondary | ICD-10-CM

## 2020-07-10 ENCOUNTER — Telehealth: Payer: Self-pay | Admitting: Physical Medicine and Rehabilitation

## 2020-07-10 NOTE — Telephone Encounter (Signed)
Scheduled

## 2020-07-10 NOTE — Telephone Encounter (Signed)
Pt calling to get an injection with Dr.Newton and he is a pt of Dr.Whitfeild and he was suppose to have referred him. Can you please make an appt with him or give him a call back at [phone]

## 2020-07-15 ENCOUNTER — Other Ambulatory Visit: Payer: Self-pay | Admitting: Orthopaedic Surgery

## 2020-07-15 NOTE — Telephone Encounter (Signed)
sent 

## 2020-07-16 ENCOUNTER — Encounter: Payer: Self-pay | Admitting: Physical Medicine and Rehabilitation

## 2020-07-16 ENCOUNTER — Ambulatory Visit: Payer: Medicare PPO | Admitting: Physical Medicine and Rehabilitation

## 2020-07-16 ENCOUNTER — Ambulatory Visit: Payer: Self-pay

## 2020-07-16 ENCOUNTER — Other Ambulatory Visit: Payer: Self-pay

## 2020-07-16 DIAGNOSIS — M25551 Pain in right hip: Secondary | ICD-10-CM | POA: Diagnosis not present

## 2020-07-16 MED ORDER — TRIAMCINOLONE ACETONIDE 40 MG/ML IJ SUSP
60.0000 mg | INTRAMUSCULAR | Status: AC | PRN
  Administered 2020-07-16: 60 mg via INTRA_ARTICULAR

## 2020-07-16 MED ORDER — BUPIVACAINE HCL 0.25 % IJ SOLN
4.0000 mL | INTRAMUSCULAR | Status: AC | PRN
  Administered 2020-07-16: 4 mL via INTRA_ARTICULAR

## 2020-07-16 NOTE — Progress Notes (Signed)
   [name] - 87 y.o. male MRN [id number]  Date of birth: February 19, 1929  Office Visit Note: Visit Date: 07/16/2020 PCP: Farris Has, MD Referred by: Farris Has, MD  Subjective: Chief Complaint  Patient presents with  . Right Hip - Pain   HPI:  Lucas Shepherd is a 87 y.o. male who comes in today at the request of Dr. Norlene Campbell for planned Right anesthetic hip arthrogram with fluoroscopic guidance.  The patient has failed conservative care including home exercise, medications, time and activity modification.  This injection will be diagnostic and hopefully therapeutic.  Please see requesting physician notes for further details and justification.   ROS Otherwise per HPI.  Assessment & Plan: Visit Diagnoses:    ICD-10-CM   1. Pain in right hip  M25.551 XR C-ARM NO REPORT    Large Joint Inj: R hip joint    Plan: No additional findings.   Meds & Orders: No orders of the defined types were placed in this encounter.   Orders Placed This Encounter  Procedures  . Large Joint Inj: R hip joint  . XR C-ARM NO REPORT    Follow-up: Return for visit to requesting physician as needed.   Procedures: Large Joint Inj: R hip joint on 07/16/2020 4:20 PM Indications: pain and diagnostic evaluation Details: 22 G needle, anterior approach  Arthrogram: Yes  Medications: 4 mL bupivacaine 0.25 %; 60 mg triamcinolone acetonide 40 MG/ML Outcome: tolerated well, no immediate complications  Arthrogram demonstrated excellent flow of contrast throughout the joint surface without extravasation or obvious defect.  The patient had relief of symptoms during the anesthetic phase of the injection.  Procedure, treatment alternatives, risks and benefits explained, specific risks discussed. Consent was given by the patient. Immediately prior to procedure a time out was called to verify the correct patient, procedure, equipment, support staff and site/side marked as required. Patient was  prepped and draped in the usual sterile fashion.          Clinical History: No specialty comments available.     Objective:  VS:  HT:    WT:   BMI:     BP:   HR: bpm  TEMP: ( )  RESP:  Physical Exam   Imaging: XR C-ARM NO REPORT  Result Date: 07/16/2020 Please see Notes tab for imaging impression.

## 2020-07-16 NOTE — Progress Notes (Signed)
Pt state right hip pain. Pt state anything that put weight on his hip makes it worse. Pt state he takes pain meds to help ease the pain.  Numeric Pain Rating Scale and Functional Assessment Average Pain 8   In the last MONTH (on 0-10 scale) has pain interfered with the following?  1. General activity like being  able to carry out your everyday physical activities such as walking, climbing stairs, carrying groceries, or moving a chair?  Rating(8)

## 2020-07-31 ENCOUNTER — Encounter: Payer: Self-pay | Admitting: Orthopaedic Surgery

## 2020-07-31 ENCOUNTER — Other Ambulatory Visit: Payer: Self-pay

## 2020-07-31 ENCOUNTER — Ambulatory Visit: Payer: Medicare PPO | Admitting: Orthopaedic Surgery

## 2020-07-31 VITALS — Ht 69.5 in | Wt 182.0 lb

## 2020-07-31 DIAGNOSIS — M25551 Pain in right hip: Secondary | ICD-10-CM

## 2020-07-31 DIAGNOSIS — M5441 Lumbago with sciatica, right side: Secondary | ICD-10-CM | POA: Diagnosis not present

## 2020-07-31 DIAGNOSIS — G8929 Other chronic pain: Secondary | ICD-10-CM | POA: Diagnosis not present

## 2020-07-31 MED ORDER — TRAMADOL HCL 50 MG PO TABS: 50.0000 mg | ORAL_TABLET | Freq: Three times a day (TID) | ORAL | 1 refills | Status: DC | PRN

## 2020-07-31 NOTE — Progress Notes (Signed)
Office Visit Note   Patient: Lucas Shepherd           Date of Birth: 08-06-1928           MRN: [id number] Visit Date: 07/31/2020              Requested by: London Pepper, MD [address],  [place] [address] PCP: London Pepper, MD   Assessment & Plan: Visit Diagnoses:  1. Chronic right-sided low back pain with right-sided sciatica   2. Pain in right hip     Plan: Mr. Motta was seen by Dr. Lennart Pall fourth for an intra-articular cortisone injection of the right hip.  He had several days of pain relief only to have it recur.  He is still experiencing pain in the area of his sacrum and low back when he stands for a length of time or when he walks any distance as well as pain along the lateral aspect of his right hip and even in the groin.  He has been evaluated at [facility] and has had several injections around the hip that did not provide much relief.  Based on his MRI scan demonstrating some degenerative changes in his right hip I had Dr. Ernestina Patches inject the hip with the above result.  He is taking tramadol almost on a daily basis to relieve his pain is been having trouble now for several years.  He has had a prior fusion of L2-3 at [facility] but has significant degenerative disc disease at L3-4, L4-5 and L5-S1.  He had an MRI scan in the fall 2021 at [facility] revealing diffuse foraminal stenosis in the lower 3 levels. I think that his problem is multifactorial.  I think there is some pain referred from his right hip.  He has evidence of gluteus medius and minimus atrophy from an unknown etiology other than either disuse or possibly referred from his back.  I think the neck step is to be evaluated at [facility] for his back as they have all of his records.  He actually has made an appointment for 1 February.  We will await their evaluation.  We have discussed exercises for the atrophy but he notes it is hard for him to perform the  exercises because it "hurts".  We are more than happy to facilitate any injections with therapy the recommendation.  He will let me know.  I will renew the tramadol  Follow-Up Instructions: Return After appointment with [facility] neurosurgery.   Orders:  No orders of the defined types were placed in this encounter.  No orders of the defined types were placed in this encounter.     Procedures: No procedures performed   Clinical Data: No additional findings.   Subjective: Chief Complaint  Patient presents with  . Right Hip - Follow-up  Patient presents today for follow up on his right hip. He saw Dr.Newton two weeks ago and received a cortisone injection in his hip. He states that the injection only helped for a few hours and the pain returned.   HPI  Review of Systems   Objective: Vital Signs: Ht 5' 9.5" (1.765 m)   Wt 182 lb (82.6 kg)   BMI 26.49 kg/m   Physical Exam Constitutional:      Appearance: He is well-developed and well-nourished.  HENT:     Mouth/Throat:     Mouth: Oropharynx is clear and moist.  Eyes:     Extraocular  Movements: EOM normal.     Pupils: Pupils are equal, round, and reactive to light.  Pulmonary:     Effort: Pulmonary effort is normal.  Skin:    General: Skin is warm and dry.  Neurological:     Mental Status: He is alert and oriented to person, place, and time.  Psychiatric:        Mood and Affect: Mood and affect normal.        Behavior: Behavior normal.     Ortho Exam awake alert and oriented x3.  Comfortable sitting.  He had a little bit of pain in his right groin on the extreme of internal and external rotation.  A lot of the pain was referred to the lateral aspect of his hip over the greater trochanter in the area of the gluteus medius and minimus.  He also had some pain in the right parasacral area.  Exam appeared to be intact.  No percussible tenderness of lumbar spine and straight leg raise was negative.  Specialty Comments:  No  specialty comments available.  Imaging: No results found.   PMFS History: Patient Active Problem List   Diagnosis Date Noted  . Pain in right hip 07/03/2020  . Low back pain 07/03/2020  . Status post laparoscopic fundoplication [id number]  . History of peptic ulcer 07/24/2011  . Esophageal stricture 06/26/2011  . H/O: upper GI bleed 06/26/2011  . Edema extremities 06/26/2011  . Chest pain 06/26/2011  . Anemia due to blood loss, acute 06/26/2011  . Syncope and collapse 06/11/2011  . Melena 06/11/2011  . Anemia 06/11/2011  . Azotemia 06/11/2011  . Volume depletion 06/11/2011  . Leukocytosis 06/11/2011  . DYSPHAGIA 08/02/2009  . HIATAL HERNIA WITH REFLUX 01/30/2009  . VITAMIN B12 DEFICIENCY 01/01/2009  . BENIGN PROSTATIC HYPERTROPHY, WITH OBSTRUCTION 12/28/2008  . FECAL INCONTINENCE 12/28/2008  . GERD 12/27/2008  . DIVERTICULOSIS, COLON 12/27/2008  . RENAL CALCULUS 12/27/2008   Past Medical History:  Diagnosis Date  . Anemia   . Arthritis   . Blood transfusion   . BPH (benign prostatic hyperplasia)   . External hemorrhoids   . GERD (gastroesophageal reflux disease)    nisseum fundoplication [id number]  . GI bleed 12/12   got 3 units blood  . Hiatal hernia   . History of TMJ syndrome    mild  . History of urinary frequency   . Kidney stones    History of  . Osteoporosis   . Urinary urgency     Family History  Problem Relation Age of Onset  . Heart defect Father        aortic valve  . Nephrolithiasis Mother   . Lung cancer Brother        smoker  . Colon cancer Neg Hx   . Esophageal cancer Neg Hx   . Stomach cancer Neg Hx   . Rectal cancer Neg Hx     Past Surgical History:  Procedure Laterality Date  . ABDOMINAL SURGERY  1985   tummy tuc  . CATARACT EXTRACTION Bilateral   . COLONOSCOPY    . ESOPHAGOGASTRODUODENOSCOPY  06/12/2011   Procedure: ESOPHAGOGASTRODUODENOSCOPY (EGD);  Surgeon: Scarlette Shorts, MD;  Location: [facility];  Service: Endoscopy;   Laterality: N/A;  . HARDWARE REMOVAL  02/23/2012   Procedure: HARDWARE REMOVAL;  Surgeon: Cammie Sickle., MD;  Location: [facility];  Service: Orthopedics;  Laterality: Left;  biceps tenolysis and screw removal  . KNEE ARTHROSCOPY Left 7/13 2017  . NISSEN  FUNDOPLICATION  4259  . ORIF HUMERUS FRACTURE  12/24/2011   Procedure: OPEN REDUCTION INTERNAL FIXATION (ORIF) PROXIMAL HUMERUS FRACTURE;  Surgeon: Cammie Sickle., MD;  Location: [facility];  Service: Orthopedics;  Laterality: Left;  ORIF left humerus  . SHOULDER ARTHROSCOPY Right 1954  . SHOULDER ARTHROSCOPY Left 05/28/2011   x 2  . spine fussion     Social History   Occupational History  . Occupation: retired  Tobacco Use  . Smoking status: Former Smoker    Packs/day: 1.00    Years: 12.00    Pack years: 12.00    Types: Cigarettes    Quit date: 05/26/1966    Years since quitting: 54.2  . Smokeless tobacco: Never Used  Vaping Use  . Vaping Use: Never used  Substance and Sexual Activity  . Alcohol use: Yes    Alcohol/week: 1.0 standard drink    Types: 1 Shots of liquor per week    Comment: red wine with dinner  . Drug use: No  . Sexual activity: Never

## 2020-08-06 ENCOUNTER — Ambulatory Visit: Payer: Medicare PPO | Admitting: Orthopaedic Surgery

## 2020-08-13 DIAGNOSIS — M810 Age-related osteoporosis without current pathological fracture: Secondary | ICD-10-CM | POA: Diagnosis not present

## 2020-08-13 DIAGNOSIS — M25559 Pain in unspecified hip: Secondary | ICD-10-CM | POA: Diagnosis not present

## 2020-08-13 DIAGNOSIS — N189 Chronic kidney disease, unspecified: Secondary | ICD-10-CM | POA: Diagnosis not present

## 2020-08-13 DIAGNOSIS — Z87891 Personal history of nicotine dependence: Secondary | ICD-10-CM | POA: Diagnosis not present

## 2020-08-13 DIAGNOSIS — M160 Bilateral primary osteoarthritis of hip: Secondary | ICD-10-CM | POA: Diagnosis not present

## 2020-08-13 DIAGNOSIS — M1611 Unilateral primary osteoarthritis, right hip: Secondary | ICD-10-CM | POA: Diagnosis not present

## 2020-08-14 ENCOUNTER — Encounter: Payer: Self-pay | Admitting: Orthopaedic Surgery

## 2020-08-21 DIAGNOSIS — M25551 Pain in right hip: Secondary | ICD-10-CM | POA: Diagnosis not present

## 2020-08-21 DIAGNOSIS — M25559 Pain in unspecified hip: Secondary | ICD-10-CM | POA: Diagnosis not present

## 2020-08-21 DIAGNOSIS — E538 Deficiency of other specified B group vitamins: Secondary | ICD-10-CM | POA: Diagnosis not present

## 2020-08-21 DIAGNOSIS — Z0000 Encounter for general adult medical examination without abnormal findings: Secondary | ICD-10-CM | POA: Diagnosis not present

## 2020-08-21 DIAGNOSIS — E7800 Pure hypercholesterolemia, unspecified: Secondary | ICD-10-CM | POA: Diagnosis not present

## 2020-08-21 DIAGNOSIS — M810 Age-related osteoporosis without current pathological fracture: Secondary | ICD-10-CM | POA: Diagnosis not present

## 2020-09-04 ENCOUNTER — Encounter: Payer: Self-pay | Admitting: Orthopaedic Surgery

## 2020-09-12 ENCOUNTER — Encounter: Payer: Self-pay | Admitting: Orthopaedic Surgery

## 2020-09-13 ENCOUNTER — Other Ambulatory Visit: Payer: Self-pay | Admitting: Orthopaedic Surgery

## 2020-09-13 ENCOUNTER — Encounter: Payer: Self-pay | Admitting: Orthopaedic Surgery

## 2020-09-13 MED ORDER — TRAMADOL HCL 50 MG PO TABS: 50.0000 mg | ORAL_TABLET | Freq: Two times a day (BID) | ORAL | 1 refills | Status: DC | PRN

## 2020-09-13 NOTE — Telephone Encounter (Signed)
Will renew tramadol

## 2020-09-13 NOTE — Telephone Encounter (Signed)
Responded to pts questions

## 2020-09-13 NOTE — Telephone Encounter (Signed)
Apparently tramadol refused-can you find out why-away from computed for sev hrs

## 2020-09-13 NOTE — Telephone Encounter (Signed)
sent 

## 2020-09-19 ENCOUNTER — Encounter (HOSPITAL_COMMUNITY): Payer: Self-pay

## 2020-09-19 ENCOUNTER — Emergency Department (HOSPITAL_COMMUNITY)
Admission: EM | Admit: 2020-09-19 | Discharge: 2020-09-19 | Disposition: A | Discharge status: Home / Self Care | Payer: Medicare PPO | Attending: Emergency Medicine | Admitting: Emergency Medicine

## 2020-09-19 ENCOUNTER — Other Ambulatory Visit: Payer: Self-pay

## 2020-09-19 DIAGNOSIS — Z87891 Personal history of nicotine dependence: Secondary | ICD-10-CM | POA: Diagnosis not present

## 2020-09-19 DIAGNOSIS — N3001 Acute cystitis with hematuria: Secondary | ICD-10-CM | POA: Diagnosis not present

## 2020-09-19 DIAGNOSIS — R319 Hematuria, unspecified: Secondary | ICD-10-CM | POA: Diagnosis present

## 2020-09-19 LAB — URINALYSIS, ROUTINE W REFLEX MICROSCOPIC
Bilirubin Urine: NEGATIVE
Glucose, UA: NEGATIVE mg/dL
Ketones, ur: NEGATIVE mg/dL
Nitrite: NEGATIVE
Protein, ur: 100 mg/dL — AB
Specific Gravity, Urine: 1.006 (ref 1.005–1.030)
WBC, UA: >50 WBC/hpf — ABNORMAL HIGH (ref 0–5)
pH: 7 (ref 5.0–8.0)

## 2020-09-19 LAB — I-STAT CHEM 8, ED
BUN: 19 mg/dL (ref 8–23)
Calcium, Ion: 1.2 mmol/L (ref 1.15–1.40)
Chloride: 102 mmol/L (ref 98–111)
Creatinine, Ser: 0.9 mg/dL (ref 0.61–1.24)
Glucose, Bld: 91 mg/dL (ref 70–99)
HCT: 38 % — ABNORMAL LOW (ref 39.0–52.0)
Hemoglobin: 12.9 g/dL — ABNORMAL LOW (ref 13.0–17.0)
Potassium: 4.2 mmol/L (ref 3.5–5.1)
Sodium: 138 mmol/L (ref 135–145)
TCO2: 26 mmol/L (ref 22–32)

## 2020-09-19 MED ORDER — CEPHALEXIN 500 MG PO CAPS: 500.0000 mg | ORAL_CAPSULE | Freq: Three times a day (TID) | ORAL | 0 refills | Status: DC

## 2020-09-19 MED ORDER — CEFTRIAXONE SODIUM 1 G IJ SOLR
500.0000 mg | Freq: Once | INTRAMUSCULAR | Status: AC
  Administered 2020-09-19: 500 mg via INTRAMUSCULAR
  Filled 2020-09-19: qty 10

## 2020-09-19 NOTE — Discharge Instructions (Addendum)
You are seen in the ER for bloody urine.  We suspect that the cause for it is bladder infection.  Take the antibiotics prescribed.  See your primary care doctor in 1 week.  As discussed, occasionally the clots can clog up your urethra (tubing that takes the urine out).  If that occurs you might notice reduced urine output, severe abdominal pain, abdominal distention.  If that were to occur, please return to the ER.

## 2020-09-19 NOTE — ED Triage Notes (Signed)
Pt reports hematuria, urinary frequency, urinary urgency, and pain with urination that began this morning. Pt denies flank pain and fever.

## 2020-09-19 NOTE — ED Provider Notes (Signed)
[facility] DEPT Provider Note   CSN: [id number] Arrival date & time: 09/19/20  1211     History Chief Complaint  Patient presents with  . Hematuria  . Urinary Frequency    Lucas Shepherd is a 87 y.o. male.  HPI     87 year old male with history of UTI comes in with chief complaint of bloody urine and urinary frequency.  Patient reports that when he woke up to urinate in the middle the night, he started having some stinging feeling.  He has had similar symptoms in the past with UTI.  Thereafter he has had increased urinary frequency and gross hematuria.  Prior to ED arrival he had mild clots coming out -prompting him to come to the ER.  His last UTI was about a year and a half ago.  He has had similar symptoms with UTI, but typically does not get hematuria.  Patient denies any anticoagulation or even antiplatelet agent usage.  He denies any abdominal pain, back pain.  No nausea, vomiting, fevers but patient does indicate feeling a little cold.  He is not on any immunosuppressive agents and does not have diabetes.  Past Medical History:  Diagnosis Date  . Anemia   . Arthritis   . Blood transfusion   . BPH (benign prostatic hyperplasia)   . External hemorrhoids   . GERD (gastroesophageal reflux disease)    nisseum fundoplication 3382  . GI bleed 12/12   got 3 units blood  . Hiatal hernia   . History of TMJ syndrome    mild  . History of urinary frequency   . Kidney stones    History of  . Osteoporosis   . Urinary urgency     Patient Active Problem List   Diagnosis Date Noted  . Pain in right hip 07/03/2020  . Low back pain 07/03/2020  . Status post laparoscopic fundoplication 50/53/9767  . History of peptic ulcer 07/24/2011  . Esophageal stricture 06/26/2011  . H/O: upper GI bleed 06/26/2011  . Edema extremities 06/26/2011  . Chest pain 06/26/2011  . Anemia due to blood loss, acute 06/26/2011  . Syncope and collapse 06/11/2011   . Melena 06/11/2011  . Anemia 06/11/2011  . Azotemia 06/11/2011  . Volume depletion 06/11/2011  . Leukocytosis 06/11/2011  . DYSPHAGIA 08/02/2009  . HIATAL HERNIA WITH REFLUX 01/30/2009  . VITAMIN B12 DEFICIENCY 01/01/2009  . BENIGN PROSTATIC HYPERTROPHY, WITH OBSTRUCTION 12/28/2008  . FECAL INCONTINENCE 12/28/2008  . GERD 12/27/2008  . DIVERTICULOSIS, COLON 12/27/2008  . RENAL CALCULUS 12/27/2008    Past Surgical History:  Procedure Laterality Date  . ABDOMINAL SURGERY  1985   tummy tuc  . CATARACT EXTRACTION Bilateral   . COLONOSCOPY    . ESOPHAGOGASTRODUODENOSCOPY  06/12/2011   Procedure: ESOPHAGOGASTRODUODENOSCOPY (EGD);  Surgeon: Scarlette Shorts, MD;  Location: [facility];  Service: Endoscopy;  Laterality: N/A;  . HARDWARE REMOVAL  02/23/2012   Procedure: HARDWARE REMOVAL;  Surgeon: Cammie Sickle., MD;  Location: [facility];  Service: Orthopedics;  Laterality: Left;  biceps tenolysis and screw removal  . KNEE ARTHROSCOPY Left 7/13 2017  . NISSEN FUNDOPLICATION  3419  . ORIF HUMERUS FRACTURE  12/24/2011   Procedure: OPEN REDUCTION INTERNAL FIXATION (ORIF) PROXIMAL HUMERUS FRACTURE;  Surgeon: Cammie Sickle., MD;  Location: [facility];  Service: Orthopedics;  Laterality: Left;  ORIF left humerus  . SHOULDER ARTHROSCOPY Right 1954  . SHOULDER ARTHROSCOPY Left 05/28/2011   x 2  .  spine fussion         Family History  Problem Relation Age of Onset  . Heart defect Father        aortic valve  . Nephrolithiasis Mother   . Lung cancer Brother        smoker  . Colon cancer Neg Hx   . Esophageal cancer Neg Hx   . Stomach cancer Neg Hx   . Rectal cancer Neg Hx     Social History   Tobacco Use  . Smoking status: Former Smoker    Packs/day: 1.00    Years: 12.00    Pack years: 12.00    Types: Cigarettes    Quit date: 05/26/1966    Years since quitting: 54.3  . Smokeless tobacco: Never Used  Vaping Use  . Vaping Use: Never used   Substance Use Topics  . Alcohol use: Yes    Alcohol/week: 1.0 standard drink    Types: 1 Shots of liquor per week    Comment: red wine with dinner  . Drug use: No    Home Medications Prior to Admission medications   Medication Sig Start Date End Date Taking? Authorizing Provider  B Complex-Biotin-FA (B COMPLETE) TABS Take 0.5 tablets by mouth at bedtime.    [provider]  Calcium Carb-Cholecalciferol (CALCIUM + D3 PO) Take 1 tablet by mouth daily with breakfast.    [provider]  Coenzyme Q10 (COQ10) 200 MG CAPS Take 200 mg by mouth every morning.    [provider]  denosumab (PROLIA) 60 MG/ML SOSY injection Inject 60 mg into the skin every 6 (six) months.    [provider]  famotidine (PEPCID) 20 MG tablet TAKE 1 TABLET BY MOUTH TWICE A DAY 05/30/20   Pyrtle, Lajuan Lines, MD  finasteride (PROSCAR) 5 MG tablet Take 5 mg by mouth at bedtime.    [provider]  fluticasone (FLONASE) 50 MCG/ACT nasal spray Place 1 spray into both nostrils at bedtime. 09/06/19   [provider]  glucosamine-chondroitin 500-400 MG tablet Take 1 tablet by mouth 2 (two) times daily.    [provider]  Methylcellulose, Laxative, (CITRUCEL FIBERSHAKE PO) Take 1 Scoop by mouth See admin instructions. Mix 1 scoopful into 8 ounces of water and drink every morning    [provider]  Methylsulfonylmethane (MSM) 1000 MG CAPS Take 1,000 mg by mouth 2 (two) times daily.    [provider]  Multiple Vitamins-Minerals (PRESERVISION AREDS) CAPS Take 1 capsule by mouth 2 (two) times daily.    [provider]  OVER THE COUNTER MEDICATION Take 1 capsule by mouth See admin instructions. Beta-Sitosterol capsules- Take 1 capsule by mouth in the morning    [provider]  OVER THE COUNTER MEDICATION Take 1 capsule by mouth See admin instructions. Bladderwrack capsules- Take 1 capsule by mouth in the morning    [provider]   polyethylene glycol powder (GLYCOLAX/MIRALAX) 17 GM/SCOOP powder Take 17 g by mouth daily.    [provider]  traMADol (ULTRAM) 50 MG tablet TAKE 1 TABLET BY MOUTH EVERY 12 HOURS AS NEEDED. 07/15/20   Garald Balding, MD  traMADol (ULTRAM) 50 MG tablet Take 1 tablet (50 mg total) by mouth 3 (three) times daily as needed. 07/31/20   Garald Balding, MD  traMADol (ULTRAM) 50 MG tablet Take 1 tablet (50 mg total) by mouth every 12 (twelve) hours as needed. 09/13/20   Garald Balding, MD  TURMERIC PO Take  1 capsule by mouth daily.     [provider]    Allergies    Aspirin and Nsaids  Review of Systems   Review of Systems  Constitutional: Negative for chills and fever.  Gastrointestinal: Negative for abdominal pain, nausea and vomiting.  Genitourinary: Positive for dysuria, frequency and hematuria. Negative for flank pain.  Allergic/Immunologic: Negative for immunocompromised state.  Hematological: Does not bruise/bleed easily.    Physical Exam Updated Vital Signs BP (!) 148/69   Pulse 68   Temp 98.4 F (36.9 C) (Oral)   Resp 18   Ht 5' 9.5" (1.765 m)   Wt 84.8 kg   SpO2 100%   BMI 27.22 kg/m   Physical Exam Vitals and nursing note reviewed.  Constitutional:      Appearance: He is well-developed.  HENT:     Head: Atraumatic.  Cardiovascular:     Rate and Rhythm: Normal rate.  Pulmonary:     Effort: Pulmonary effort is normal.  Abdominal:     Tenderness: There is no abdominal tenderness.  Musculoskeletal:     Cervical back: Neck supple.  Skin:    General: Skin is warm.  Neurological:     Mental Status: He is alert and oriented to person, place, and time.     ED Results / Procedures / Treatments   Labs (all labs ordered are listed, but only abnormal results are displayed) Labs Reviewed  URINALYSIS, ROUTINE W REFLEX MICROSCOPIC - Abnormal; Notable for the following components:      Result Value   Color, Urine AMBER (*)    APPearance HAZY  (*)    Hgb urine dipstick LARGE (*)    Protein, ur 100 (*)    Leukocytes,Ua LARGE (*)    WBC, UA >50 (*)    Bacteria, UA RARE (*)    All other components within normal limits  URINE CULTURE    EKG None  Radiology No results found.  Procedures Procedures   Medications Ordered in ED Medications  cefTRIAXone (ROCEPHIN) injection 500 mg (has no administration in time range)    ED Course  I have reviewed the triage vital signs and the nursing notes.  Pertinent labs & imaging results that were available during my care of the patient were reviewed by me and considered in my medical decision making (see chart for details).    MDM Rules/Calculators/A&P                          87 year old male comes in with the triad of dysuria, hematuria, urinary frequency.  No flank pain, abdominal pain or any systemic symptoms with it.  Patient is immunocompetent.  He has had UTI with 2 out of 3 symptoms before.  Suspect UTI/acute cystitis.  Labs from last year reviewed.  Patient had normal platelet.  He is not on any anticoagulants or antiplatelet agent.  We will get i-STAT Chem-8 to just get baseline hemoglobin and creatinine.  We will give him IM Rocephin now and discharge him with Keflex.  Prior susceptibilities reviewed.  Final Clinical Impression(s) / ED Diagnoses Final diagnoses:  Acute cystitis with hematuria    Rx / DC Orders ED Discharge Orders    None       Varney Biles, MD 09/19/20 1545

## 2020-09-21 LAB — URINE CULTURE: Culture: <10000 — AB

## 2020-09-25 DIAGNOSIS — Z85828 Personal history of other malignant neoplasm of skin: Secondary | ICD-10-CM | POA: Diagnosis not present

## 2020-09-25 DIAGNOSIS — L720 Epidermal cyst: Secondary | ICD-10-CM | POA: Diagnosis not present

## 2020-09-25 DIAGNOSIS — L821 Other seborrheic keratosis: Secondary | ICD-10-CM | POA: Diagnosis not present

## 2020-09-25 DIAGNOSIS — L570 Actinic keratosis: Secondary | ICD-10-CM | POA: Diagnosis not present

## 2020-10-02 DIAGNOSIS — E559 Vitamin D deficiency, unspecified: Secondary | ICD-10-CM | POA: Diagnosis not present

## 2020-10-02 DIAGNOSIS — M810 Age-related osteoporosis without current pathological fracture: Secondary | ICD-10-CM | POA: Diagnosis not present

## 2020-10-02 DIAGNOSIS — M4850XA Collapsed vertebra, not elsewhere classified, site unspecified, initial encounter for fracture: Secondary | ICD-10-CM | POA: Diagnosis not present

## 2020-10-03 DIAGNOSIS — M25551 Pain in right hip: Secondary | ICD-10-CM | POA: Diagnosis not present

## 2020-10-09 DIAGNOSIS — M25551 Pain in right hip: Secondary | ICD-10-CM | POA: Diagnosis not present

## 2020-10-10 ENCOUNTER — Encounter: Payer: Self-pay | Admitting: Orthopaedic Surgery

## 2020-10-11 DIAGNOSIS — M25551 Pain in right hip: Secondary | ICD-10-CM | POA: Diagnosis not present

## 2020-10-14 DIAGNOSIS — M25551 Pain in right hip: Secondary | ICD-10-CM | POA: Diagnosis not present

## 2020-10-23 ENCOUNTER — Ambulatory Visit: Payer: Medicare PPO | Admitting: Orthopaedic Surgery

## 2020-10-23 ENCOUNTER — Other Ambulatory Visit: Payer: Self-pay

## 2020-10-23 ENCOUNTER — Encounter: Payer: Self-pay | Admitting: Orthopaedic Surgery

## 2020-10-23 VITALS — Ht 69.5 in | Wt 187.0 lb

## 2020-10-23 DIAGNOSIS — G8929 Other chronic pain: Secondary | ICD-10-CM

## 2020-10-23 DIAGNOSIS — M25551 Pain in right hip: Secondary | ICD-10-CM

## 2020-10-23 DIAGNOSIS — M5441 Lumbago with sciatica, right side: Secondary | ICD-10-CM | POA: Diagnosis not present

## 2020-10-23 NOTE — Progress Notes (Signed)
Office Visit Note   Patient: Lucas Shepherd           Date of Birth: May 16, 1929           MRN: [id number] Visit Date: 10/23/2020              Requested by: Lucas Pepper, MD [address],  [place] [address] PCP: Lucas Pepper, MD   Assessment & Plan: Visit Diagnoses:  1. Chronic right-sided low back pain with right-sided sciatica   2. Pain in right hip     Plan: Lucas Shepherd has been dealing with pain in the area of his right hip for several years as outlined in the previous office notes.  He has had an MRI scan of his pelvis demonstrating some mild degenerative changes in both hips.  Dr. Ernestina Patches performed an intra-articular cortisone injection of the right hip with some relief over period of a week or so.  He is also injected an area over the greater trochanter without much relief.  He has been evaluated by a spine surgeon at [facility] who felt that the problem was not referable to his back.  He has had a previous back fusion.  He saw Dr. Jaclynn Guarneri at [facility] who thought his pain was most likely related to tears of the abductor tendons and the area of the greater trochanter based on his MRI scan.  He has been through a course of physical therapy and notes that he does not have the acute pain but still has some discomfort.  He has been taking tramadol for pain on an occasional basis.  Has had a prior history of gastric ulcers and cannot take NSAIDs.  He is even tried Voltaren gel.  I think his problem is a combination of several factors.  His hip exam was relatively benign in terms of any arthritis causing his pain.  He does have more discomfort over the greater trochanter with some atrophy and I think that is just related to the abductor tendon tear is noted by MRI scan.  He will continue with the exercises.  I think is fine to engage in any activity it just depends on his discomfort.  I am also concerned that he is having some component of pain related to his  back as he has discomfort in the paralumbar region and right buttock early in the morning.  Hopefully exercises will help that as well.  He might be a candidate for an epidural steroid injection at some point and will follow that  Follow-Up Instructions: Return if symptoms worsen or fail to improve.   Orders:  No orders of the defined types were placed in this encounter.  No orders of the defined types were placed in this encounter.     Procedures: No procedures performed   Clinical Data: No additional findings.   Subjective: Chief Complaint  Patient presents with  . Right Hip - Follow-up, Pain  Patient presents today for right hip pain. He received a cortisone injection in January with Dr.Newton. He said that the injection did help a little bit. He has been going to physical therapy and has noticed some improvement. He has pain in the morning at his right buttock and groin area. He said that throughout the day his hip will pop and is painful. He wanted to come back in today to discuss some concerns and questions with Dr.Doretta Remmert.   HPI  Review of Systems   Objective: Vital Signs: Ht  5' 9.5" (1.765 m)   Wt 187 lb (84.8 kg)   BMI 27.22 kg/m   Physical Exam Constitutional:      Appearance: He is well-developed.  Eyes:     Pupils: Pupils are equal, round, and reactive to light.  Pulmonary:     Effort: Pulmonary effort is normal.  Skin:    General: Skin is warm and dry.  Neurological:     Mental Status: He is alert and oriented to person, place, and time.  Psychiatric:        Behavior: Behavior normal.     Ortho Exam painless range of motion of both hips with internal/external rotation and no loss of motion.  He is still tender but less so over the greater trochanter of his right hip.  Skin intact.  No crepitation.  No percussible tenderness lumbar spine straight leg raise negative  Specialty Comments:  No specialty comments available.  Imaging: No results  found.   PMFS History: Patient Active Problem List   Diagnosis Date Noted  . Pain in right hip 07/03/2020  . Low back pain 07/03/2020  . Status post laparoscopic fundoplication 40/98/1191  . History of peptic ulcer 07/24/2011  . Esophageal stricture 06/26/2011  . H/O: upper GI bleed 06/26/2011  . Edema extremities 06/26/2011  . Chest pain 06/26/2011  . Anemia due to blood loss, acute 06/26/2011  . Syncope and collapse 06/11/2011  . Melena 06/11/2011  . Anemia 06/11/2011  . Azotemia 06/11/2011  . Volume depletion 06/11/2011  . Leukocytosis 06/11/2011  . DYSPHAGIA 08/02/2009  . HIATAL HERNIA WITH REFLUX 01/30/2009  . VITAMIN B12 DEFICIENCY 01/01/2009  . BENIGN PROSTATIC HYPERTROPHY, WITH OBSTRUCTION 12/28/2008  . FECAL INCONTINENCE 12/28/2008  . GERD 12/27/2008  . DIVERTICULOSIS, COLON 12/27/2008  . RENAL CALCULUS 12/27/2008   Past Medical History:  Diagnosis Date  . Anemia   . Arthritis   . Blood transfusion   . BPH (benign prostatic hyperplasia)   . External hemorrhoids   . GERD (gastroesophageal reflux disease)    nisseum fundoplication 4782  . GI bleed 12/12   got 3 units blood  . Hiatal hernia   . History of TMJ syndrome    mild  . History of urinary frequency   . Kidney stones    History of  . Osteoporosis   . Urinary urgency     Family History  Problem Relation Age of Onset  . Heart defect Father        aortic valve  . Nephrolithiasis Mother   . Lung cancer Brother        smoker  . Colon cancer Neg Hx   . Esophageal cancer Neg Hx   . Stomach cancer Neg Hx   . Rectal cancer Neg Hx     Past Surgical History:  Procedure Laterality Date  . ABDOMINAL SURGERY  1985   tummy tuc  . CATARACT EXTRACTION Bilateral   . COLONOSCOPY    . ESOPHAGOGASTRODUODENOSCOPY  06/12/2011   Procedure: ESOPHAGOGASTRODUODENOSCOPY (EGD);  Surgeon: Scarlette Shorts, MD;  Location: [facility];  Service: Endoscopy;  Laterality: N/A;  . HARDWARE REMOVAL  02/23/2012   Procedure:  HARDWARE REMOVAL;  Surgeon: Cammie Sickle., MD;  Location: [facility];  Service: Orthopedics;  Laterality: Left;  biceps tenolysis and screw removal  . KNEE ARTHROSCOPY Left 7/13 2017  . NISSEN FUNDOPLICATION  9562  . ORIF HUMERUS FRACTURE  12/24/2011   Procedure: OPEN REDUCTION INTERNAL FIXATION (ORIF) PROXIMAL HUMERUS FRACTURE;  Surgeon: Herbie Baltimore  Christena Flake., MD;  Location: [facility];  Service: Orthopedics;  Laterality: Left;  ORIF left humerus  . SHOULDER ARTHROSCOPY Right 1954  . SHOULDER ARTHROSCOPY Left 05/28/2011   x 2  . spine fussion     Social History   Occupational History  . Occupation: retired  Tobacco Use  . Smoking status: Former Smoker    Packs/day: 1.00    Years: 12.00    Pack years: 12.00    Types: Cigarettes    Quit date: 05/26/1966    Years since quitting: 54.4  . Smokeless tobacco: Never Used  Vaping Use  . Vaping Use: Never used  Substance and Sexual Activity  . Alcohol use: Yes    Alcohol/week: 1.0 standard drink    Types: 1 Shots of liquor per week    Comment: red wine with dinner  . Drug use: No  . Sexual activity: Never

## 2020-10-27 ENCOUNTER — Encounter: Payer: Self-pay | Admitting: Orthopaedic Surgery

## 2020-10-27 DIAGNOSIS — G8929 Other chronic pain: Secondary | ICD-10-CM

## 2020-10-27 DIAGNOSIS — M5441 Lumbago with sciatica, right side: Secondary | ICD-10-CM

## 2020-10-28 NOTE — Telephone Encounter (Signed)
Please schedule ESI with Dr Ernestina Patches

## 2020-10-29 ENCOUNTER — Telehealth: Payer: Self-pay | Admitting: Orthopaedic Surgery

## 2020-10-29 NOTE — Telephone Encounter (Signed)
Spoke with patient and told him it was ordered yesterday.

## 2020-10-29 NOTE — Telephone Encounter (Signed)
Patient called requesting a call back from Bevere[facility] Risen about a referral from Dr. Durward Fortes for an epidural. Patient states to be in pain and would like an appt as soon as possible.

## 2020-11-05 ENCOUNTER — Encounter: Payer: Self-pay | Admitting: Physical Medicine and Rehabilitation

## 2020-11-12 ENCOUNTER — Ambulatory Visit (INDEPENDENT_AMBULATORY_CARE_PROVIDER_SITE_OTHER): Payer: Medicare PPO | Admitting: Physical Medicine and Rehabilitation

## 2020-11-12 ENCOUNTER — Other Ambulatory Visit: Payer: Self-pay

## 2020-11-12 ENCOUNTER — Ambulatory Visit: Payer: Self-pay

## 2020-11-12 ENCOUNTER — Other Ambulatory Visit: Payer: Self-pay | Admitting: Orthopaedic Surgery

## 2020-11-12 ENCOUNTER — Encounter: Payer: Self-pay | Admitting: Physical Medicine and Rehabilitation

## 2020-11-12 VITALS — BP 120/74 | HR 64

## 2020-11-12 DIAGNOSIS — M48062 Spinal stenosis, lumbar region with neurogenic claudication: Secondary | ICD-10-CM | POA: Diagnosis not present

## 2020-11-12 DIAGNOSIS — M5416 Radiculopathy, lumbar region: Secondary | ICD-10-CM | POA: Diagnosis not present

## 2020-11-12 MED ORDER — BETAMETHASONE SOD PHOS & ACET 6 (3-3) MG/ML IJ SUSP
12.0000 mg | Freq: Once | INTRAMUSCULAR | Status: AC
  Administered 2020-11-12: 12 mg

## 2020-11-12 MED ORDER — TRAMADOL HCL 50 MG PO TABS: 50.0000 mg | ORAL_TABLET | Freq: Two times a day (BID) | ORAL | 0 refills | Status: DC | PRN

## 2020-11-12 NOTE — Telephone Encounter (Signed)
Sent in

## 2020-11-12 NOTE — Progress Notes (Signed)
Pt state lower back pain that travels to his right hip. Pt state any movement makes the pain worse. Pt state he takes pain meds to help ease the pain.  Numeric Pain Rating Scale and Functional Assessment Average Pain 9   In the last MONTH (on 0-10 scale) has pain interfered with the following?  1. General activity like being  able to carry out your everyday physical activities such as walking, climbing stairs, carrying groceries, or moving a chair?  Rating(9)   +Driver, -BT, -Dye Allergies.

## 2020-11-12 NOTE — Procedures (Signed)
Lumbar Epidural Steroid Injection - Interlaminar Approach with Fluoroscopic Guidance  Patient: Lucas Shepherd      Date of Birth: 12/30/1928 MRN: [id number] PCP: London Pepper, MD      Visit Date: 11/12/2020   Universal Protocol:     Consent Given By: the patient  Position: PRONE  Additional Comments: Vital signs were monitored before and after the procedure. Patient was prepped and draped in the usual sterile fashion. The correct patient, procedure, and site was verified.   Injection Procedure Details:   Procedure diagnoses: Lumbar radiculopathy [M54.16]   Meds Administered:  Meds ordered this encounter  Medications  . betamethasone acetate-betamethasone sodium phosphate (CELESTONE) injection 12 mg     Laterality: Right  Location/Site:  L4-L5  Needle: 3.5 in., 20 ga. Tuohy  Needle Placement: Paramedian epidural  Findings:   -Comments: Excellent flow of contrast into the epidural space.  Procedure Details: Using a paramedian approach from the side mentioned above, the region overlying the inferior lamina was localized under fluoroscopic visualization and the soft tissues overlying this structure were infiltrated with 4 ml. of 1% Lidocaine without Epinephrine. The Tuohy needle was inserted into the epidural space using a paramedian approach.   The epidural space was localized using loss of resistance along with counter oblique bi-planar fluoroscopic views.  After negative aspirate for air, blood, and CSF, a 2 ml. volume of Isovue-250 was injected into the epidural space and the flow of contrast was observed. Radiographs were obtained for documentation purposes.    The injectate was administered into the level noted above.   Additional Comments:  The patient tolerated the procedure well Dressing: 2 x 2 sterile gauze and Band-Aid    Post-procedure details: Patient was observed during the procedure. Post-procedure instructions were reviewed.  Patient left the  clinic in stable condition.

## 2020-11-12 NOTE — Patient Instructions (Signed)

## 2020-11-12 NOTE — Progress Notes (Signed)
Lucas Shepherd - 87 y.o. male MRN [id number]  Date of birth: 20-Dec-1928  Office Visit Note: Visit Date: 11/12/2020 PCP: London Pepper, MD Referred by: London Pepper, MD  Subjective: Chief Complaint  Patient presents with  . Lower Back - Pain  . Right Hip - Pain   HPI:  Lucas Shepherd is a 87 y.o. male who comes in today at the request of Dr. Joni Fears for planned Right L4-5 Lumbar epidural steroid injection with fluoroscopic guidance.  The patient has failed conservative care including home exercise, medications, time and activity modification.  This injection will be diagnostic and hopefully therapeutic.  Please see requesting physician notes for further details and justification. MRI reviewed with images and spine model.  MRI reviewed in the note below.    ROS Otherwise per HPI.  Assessment & Plan: Visit Diagnoses:    ICD-10-CM   1. Lumbar radiculopathy  M54.16 XR C-ARM NO REPORT    Epidural Steroid injection    betamethasone acetate-betamethasone sodium phosphate (CELESTONE) injection 12 mg  2. Spinal stenosis of lumbar region with neurogenic claudication  M48.062 XR C-ARM NO REPORT    Epidural Steroid injection    betamethasone acetate-betamethasone sodium phosphate (CELESTONE) injection 12 mg    Plan: No additional findings.   Meds & Orders:  Meds ordered this encounter  Medications  . betamethasone acetate-betamethasone sodium phosphate (CELESTONE) injection 12 mg    Orders Placed This Encounter  Procedures  . XR C-ARM NO REPORT  . Epidural Steroid injection    Follow-up: Return in about 2 weeks (around 11/26/2020) for Joni Fears, MD.   Procedures: No procedures performed  Lumbar Epidural Steroid Injection - Interlaminar Approach with Fluoroscopic Guidance  Patient: Lucas Shepherd      Date of Birth: 07-23-1928 MRN: [id number] PCP: London Pepper, MD      Visit Date: 11/12/2020   Universal Protocol:     Consent Given By: the  patient  Position: PRONE  Additional Comments: Vital signs were monitored before and after the procedure. Patient was prepped and draped in the usual sterile fashion. The correct patient, procedure, and site was verified.   Injection Procedure Details:   Procedure diagnoses: Lumbar radiculopathy [M54.16]   Meds Administered:  Meds ordered this encounter  Medications  . betamethasone acetate-betamethasone sodium phosphate (CELESTONE) injection 12 mg     Laterality: Right  Location/Site:  L4-L5  Needle: 3.5 in., 20 ga. Tuohy  Needle Placement: Paramedian epidural  Findings:   -Comments: Excellent flow of contrast into the epidural space.  Procedure Details: Using a paramedian approach from the side mentioned above, the region overlying the inferior lamina was localized under fluoroscopic visualization and the soft tissues overlying this structure were infiltrated with 4 ml. of 1% Lidocaine without Epinephrine. The Tuohy needle was inserted into the epidural space using a paramedian approach.   The epidural space was localized using loss of resistance along with counter oblique bi-planar fluoroscopic views.  After negative aspirate for air, blood, and CSF, a 2 ml. volume of Isovue-250 was injected into the epidural space and the flow of contrast was observed. Radiographs were obtained for documentation purposes.    The injectate was administered into the level noted above.   Additional Comments:  The patient tolerated the procedure well Dressing: 2 x 2 sterile gauze and Band-Aid    Post-procedure details: Patient was observed during the procedure. Post-procedure instructions were reviewed.  Patient left the clinic in stable condition.     Clinical  History: EXAM: Magnetic resonance imaging, spinal canal and contents, lumbar, without contrast material.  DATE: 02/28/2020 3:55 PM  ACCESSION: [id number]  DICTATED: 02/28/2020 4:05 PM  INTERPRETATION LOCATION: [place]   CLINICAL INDICATION: 87 years old Male with s/p LSF now with worsening right radiculopathy.; Low back pain, prior surgery, new symptoms ; Low back pain, > 6 wks ; Spinal fusion, lumbosacral, follow up - M54.16 - Lumbar radiculopathy    COMPARISON: MRI lumbar spine 05/18/2014   TECHNIQUE: Multiplanar MRI was performed through the lumbar spine without intravenous contrast.   FINDINGS:  Sequela of L2-L3 laminectomies and posterior spinal fixation which appears unchanged. Small nonspecific cystic fluid collection within the laminectomy space is minimally decreased in size measuring 2.0 cm (5:33). Degenerative marrow signal changes predominantly within the L3-S1 vertebra. The visualized cord is unremarkable and the conus medullaris ends at a normal level.   Straightening of the normal lumbar lordosis. Multilevel severe intervertebral disc space narrowing and disc desiccation.   L1-L2: Posterior disc bulge. No herniation. Facet arthropathy and ligament of flavum hypertrophy. Moderate spinal canal narrowing. Moderate right neural foraminal narrowing.   L2-L3: Anterolisthesis of L2 on L3 of 0.4 cm. Mild bilateral foraminal protrusions with annular fissures. No spinal canalnarrowing. No neural foraminal narrowing.   L3-L4: Retrolisthesis of L3 on L4 of 0.2 cm. Circumferential disc bulge. No herniation. Ligament flavum hypertrophy. Mild to moderate spinal canal narrowing which is mildly worse compared to prior. Moderate bilateral neural foraminal narrowing.   L4-L5: Circumferential disc bulge. Tiny central protrusion. Ligament of flavum hypertrophy and facet arthropathy. Mild spinal canal narrowing. Moderate bilateral, right greater than left neural foraminal narrowing.   L5-S1: Circumferential disc bulge. Facet arthropathy. No spinal canal narrowing. Severe left and moderate right neural foraminal narrowing.    For the purposes of this dictation, the lowest well formed intervertebral disc  space is assumed to be theL5-S1 level, and there are presumed to be five lumbar-type vertebral bodies.   IMPRESSION:  Postsurgical changes of L2-L3 posterior fixation.   Multilevel degenerative disc disease and facet arthropathy that is mildly worsened compared to prior.   Loss of the lumbar lordosis. Similar 57mm antero-listhesis of L2 on L3 and 77mm retro-listhesis of L3 on L4.   Multilevel spinal canal narrowing with moderate narrowing at L1-L2 and L3-L4, mildly worsened compared to prior.   Multilevel neural foraminal narrowing which is essentially stable, moderate to severe at all levels.     Objective:  VS:  HT:    WT:   BMI:     BP:120/74  HR:64bpm  TEMP: ( )  RESP:  Physical Exam Vitals and nursing note reviewed.  Constitutional:      General: He is not in acute distress.    Appearance: Normal appearance. He is not ill-appearing.  HENT:     Head: Normocephalic and atraumatic.     Right Ear: External ear normal.     Left Ear: External ear normal.     Nose: No congestion.  Eyes:     Extraocular Movements: Extraocular movements intact.  Cardiovascular:     Rate and Rhythm: Normal rate.     Pulses: Normal pulses.  Pulmonary:     Effort: Pulmonary effort is normal. No respiratory distress.  Abdominal:     General: There is no distension.     Palpations: Abdomen is soft.  Musculoskeletal:        General: No tenderness or signs of injury.     Cervical back: Neck supple.  Right lower leg: No edema.     Left lower leg: No edema.     Comments: Patient has good distal strength without clonus.  Skin:    Findings: No erythema or rash.  Neurological:     General: No focal deficit present.     Mental Status: He is alert and oriented to person, place, and time.     Sensory: No sensory deficit.     Motor: No weakness or abnormal muscle tone.     Coordination: Coordination normal.  Psychiatric:        Mood and Affect: Mood normal.        Behavior: Behavior normal.       Imaging: No results found.

## 2020-11-13 ENCOUNTER — Encounter: Payer: Self-pay | Admitting: Orthopaedic Surgery

## 2020-11-13 ENCOUNTER — Encounter: Payer: Self-pay | Admitting: Physical Medicine and Rehabilitation

## 2020-11-13 DIAGNOSIS — M810 Age-related osteoporosis without current pathological fracture: Secondary | ICD-10-CM | POA: Diagnosis not present

## 2020-11-13 DIAGNOSIS — E559 Vitamin D deficiency, unspecified: Secondary | ICD-10-CM | POA: Diagnosis not present

## 2020-11-19 ENCOUNTER — Encounter: Payer: Self-pay | Admitting: Orthopaedic Surgery

## 2020-11-20 ENCOUNTER — Encounter: Payer: Self-pay | Admitting: Orthopaedic Surgery

## 2020-11-20 DIAGNOSIS — M810 Age-related osteoporosis without current pathological fracture: Secondary | ICD-10-CM | POA: Diagnosis not present

## 2020-11-26 ENCOUNTER — Encounter: Payer: Self-pay | Admitting: Orthopaedic Surgery

## 2020-11-26 ENCOUNTER — Ambulatory Visit: Payer: Medicare PPO | Admitting: Orthopaedic Surgery

## 2020-11-26 ENCOUNTER — Other Ambulatory Visit: Payer: Self-pay

## 2020-11-26 ENCOUNTER — Ambulatory Visit (INDEPENDENT_AMBULATORY_CARE_PROVIDER_SITE_OTHER): Payer: Medicare PPO

## 2020-11-26 VITALS — Ht 69.5 in | Wt 187.0 lb

## 2020-11-26 DIAGNOSIS — M5441 Lumbago with sciatica, right side: Secondary | ICD-10-CM

## 2020-11-26 DIAGNOSIS — M25551 Pain in right hip: Secondary | ICD-10-CM

## 2020-11-26 DIAGNOSIS — G8929 Other chronic pain: Secondary | ICD-10-CM | POA: Diagnosis not present

## 2020-11-26 NOTE — Progress Notes (Signed)
Office Visit Note   Patient: Lucas Shepherd           Date of Birth: July 08, 1929           MRN: [id number] Visit Date: 11/26/2020              Requested by: London Pepper, MD [address],  [place] [address] PCP: London Pepper, MD   Assessment & Plan: Visit Diagnoses:  1. Pain in right hip   2. Chronic right-sided low back pain with right-sided sciatica     Plan: Mr. Kirk has been experiencing problems with his back, lateral right hip and right leg for at least a year.  He has seen numerous physicians and had numerous evaluations without any relief of his pain.  He has been seen by a spine surgeon in [facility] where he had a prior L2-3 fusion and they did not think that his problem presently was related to his back.  He has films demonstrating significant degenerative changes beneath the area of fusion at L3-4, L4-5 and L5-S1 with facet arthritis and degenerative disc disease.  He had a prior MRI scan of his pelvis performed through [facility] with some questionable partial tearing of the hip musculature about the lateral aspect of his right hip.  He had a series of injections that were not helpful.  I had Dr. Ernestina Patches inject his right hip joint without much relief.  He is also had an epidural steroid injection without much relief.  He has been taking tramadol and/or oxycodone for pain.  He has a lot of trouble when he gets up in the morning and even trouble trying to find a comfortable position sleeping.  Its been difficult to determine the cause of his pain because he does not localize any discomfort other than his buttock and his lateral hip.  He is not having much groin pain.  I still think the majority of his pain originates from his back and we will repeat the MRI scan.  Last scan was about a year ago.  I also think it is worth after the scan to be him to follow-up with the spine physician at [facility].  Follow-Up Instructions: Return After MRI  lumbar spine.   Orders:  Orders Placed This Encounter  Procedures  . XR HIP UNILAT W OR W/O PELVIS 2-3 VIEWS RIGHT  . MR Lumbar Spine w/o contrast   No orders of the defined types were placed in this encounter.     Procedures: No procedures performed   Clinical Data: No additional findings.   Subjective: Chief Complaint  Patient presents with  . Lower Back - Follow-up  Patient presents today for follow up on his lower back. He had an ESI with Dr.Newton on 11/12/2020. Patient states that the injection did not help like he thought it would. His pain has changed now and he is "miserable" in the mornings.  He has as much trouble when he is lying down as he does when he is up and is walking.  Not really having much groin pain but does have pain in his back buttock lateral right hip and thigh.  HPI  Review of Systems   Objective: Vital Signs: Ht 5' 9.5" (1.765 m)   Wt 187 lb (84.8 kg)   BMI 27.22 kg/m   Physical Exam Constitutional:      Appearance: He is well-developed.  Eyes:     Pupils: Pupils are equal, round, and reactive  to light.  Pulmonary:     Effort: Pulmonary effort is normal.  Skin:    General: Skin is warm and dry.  Neurological:     Mental Status: He is alert and oriented to person, place, and time.  Psychiatric:        Behavior: Behavior normal.     Ortho Exam I wake alert and comfortable sitting.  No shortness of breath or chest pain.  Notes that he is presently has some "aching" but has difficult time localizing his pain.  He seems to experience discomfort in his buttock and lateral left hip and thigh.  He had painless range of motion of both hips without loss of motion.  Straight leg raise was negative.  Very minimal percussible tenderness of his lumbar spine motor exam intact.  Seems to have normal sensibility there were no areas of tenderness about the right hip posteriorly laterally or anteriorly  Specialty Comments:  No specialty comments  available.  Imaging: XR HIP UNILAT W OR W/O PELVIS 2-3 VIEWS RIGHT  Result Date: 11/26/2020 AP pelvis and lateral of the right hip are obtained.  The joint spaces are well-maintained even compared to the left hip.  No ectopic calcification.  No acute changes.  No obvious bony lesions.  Prior MRI scan demonstrated some mild to moderate chondromalacia of but not obvious by film    PMFS History: Patient Active Problem List   Diagnosis Date Noted  . Pain in right hip 07/03/2020  . Low back pain 07/03/2020  . Status post laparoscopic fundoplication 18/29/9371  . History of peptic ulcer 07/24/2011  . Esophageal stricture 06/26/2011  . H/O: upper GI bleed 06/26/2011  . Edema extremities 06/26/2011  . Chest pain 06/26/2011  . Anemia due to blood loss, acute 06/26/2011  . Syncope and collapse 06/11/2011  . Melena 06/11/2011  . Anemia 06/11/2011  . Azotemia 06/11/2011  . Volume depletion 06/11/2011  . Leukocytosis 06/11/2011  . DYSPHAGIA 08/02/2009  . HIATAL HERNIA WITH REFLUX 01/30/2009  . VITAMIN B12 DEFICIENCY 01/01/2009  . BENIGN PROSTATIC HYPERTROPHY, WITH OBSTRUCTION 12/28/2008  . FECAL INCONTINENCE 12/28/2008  . GERD 12/27/2008  . DIVERTICULOSIS, COLON 12/27/2008  . RENAL CALCULUS 12/27/2008   Past Medical History:  Diagnosis Date  . Anemia   . Arthritis   . Blood transfusion   . BPH (benign prostatic hyperplasia)   . External hemorrhoids   . GERD (gastroesophageal reflux disease)    nisseum fundoplication 6967  . GI bleed 12/12   got 3 units blood  . Hiatal hernia   . History of TMJ syndrome    mild  . History of urinary frequency   . Kidney stones    History of  . Osteoporosis   . Urinary urgency     Family History  Problem Relation Age of Onset  . Heart defect Father        aortic valve  . Nephrolithiasis Mother   . Lung cancer Brother        smoker  . Colon cancer Neg Hx   . Esophageal cancer Neg Hx   . Stomach cancer Neg Hx   . Rectal cancer Neg Hx      Past Surgical History:  Procedure Laterality Date  . ABDOMINAL SURGERY  1985   tummy tuc  . CATARACT EXTRACTION Bilateral   . COLONOSCOPY    . ESOPHAGOGASTRODUODENOSCOPY  06/12/2011   Procedure: ESOPHAGOGASTRODUODENOSCOPY (EGD);  Surgeon: Scarlette Shorts, MD;  Location: [facility];  Service: Endoscopy;  Laterality: N/A;  .  HARDWARE REMOVAL  02/23/2012   Procedure: HARDWARE REMOVAL;  Surgeon: Cammie Sickle., MD;  Location: [facility];  Service: Orthopedics;  Laterality: Left;  biceps tenolysis and screw removal  . KNEE ARTHROSCOPY Left 7/13 2017  . NISSEN FUNDOPLICATION  2683  . ORIF HUMERUS FRACTURE  12/24/2011   Procedure: OPEN REDUCTION INTERNAL FIXATION (ORIF) PROXIMAL HUMERUS FRACTURE;  Surgeon: Cammie Sickle., MD;  Location: [place] [facility];  Service: Orthopedics;  Laterality: Left;  ORIF left humerus  . SHOULDER ARTHROSCOPY Right 1954  . SHOULDER ARTHROSCOPY Left 05/28/2011   x 2  . spine fussion     Social History   Occupational History  . Occupation: retired  Tobacco Use  . Smoking status: Former Smoker    Packs/day: 1.00    Years: 12.00    Pack years: 12.00    Types: Cigarettes    Quit date: 05/26/1966    Years since quitting: 54.5  . Smokeless tobacco: Never Used  Vaping Use  . Vaping Use: Never used  Substance and Sexual Activity  . Alcohol use: Yes    Alcohol/week: 1.0 standard drink    Types: 1 Shots of liquor per week    Comment: red wine with dinner  . Drug use: No  . Sexual activity: Never

## 2020-12-07 ENCOUNTER — Other Ambulatory Visit: Payer: Self-pay | Admitting: Orthopaedic Surgery

## 2020-12-10 NOTE — Telephone Encounter (Signed)
Lucas Shepherd's patient.

## 2020-12-11 ENCOUNTER — Telehealth: Payer: Self-pay | Admitting: Orthopaedic Surgery

## 2020-12-11 NOTE — Telephone Encounter (Signed)
Spoke with patient concerning scheduling an appointment for MRI review with Dr Durward Fortes. Patient asked if he can be called with the results of his MRI? The number to contact patient is [phone]

## 2020-12-12 ENCOUNTER — Other Ambulatory Visit: Payer: Self-pay

## 2020-12-12 ENCOUNTER — Encounter: Payer: Self-pay | Admitting: Orthopaedic Surgery

## 2020-12-12 ENCOUNTER — Ambulatory Visit
Admission: RE | Admit: 2020-12-12 | Discharge: 2020-12-12 | Disposition: A | Discharge status: Home / Self Care | Payer: Medicare PPO | Source: Ambulatory Visit | Attending: Orthopaedic Surgery | Admitting: Orthopaedic Surgery

## 2020-12-12 DIAGNOSIS — M48061 Spinal stenosis, lumbar region without neurogenic claudication: Secondary | ICD-10-CM | POA: Diagnosis not present

## 2020-12-12 DIAGNOSIS — M25551 Pain in right hip: Secondary | ICD-10-CM

## 2020-12-12 DIAGNOSIS — M545 Low back pain, unspecified: Secondary | ICD-10-CM | POA: Diagnosis not present

## 2020-12-12 IMAGING — MR MR LUMBAR SPINE W/O CM
4 of 5 series · 27 of 48 positions shown · non-contrast
Comparison: Most recent comparison is from [DATE]

CLINICAL DATA: Low back pain, right hip pain

EXAM:
MRI LUMBAR SPINE WITHOUT CONTRAST
TECHNIQUE: Multiplanar, multisequence MR imaging of the lumbar spine was
performed. No intravenous contrast was administered.

[Series 3: T2 · sagittal · 4.0mm · 1.09mm/px · 6 of 17 slices shown (1 of 2)]
[im 1/17]
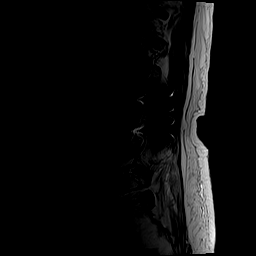
[im 4/17]
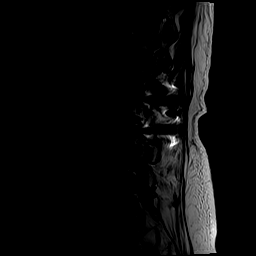
[im 7/17]
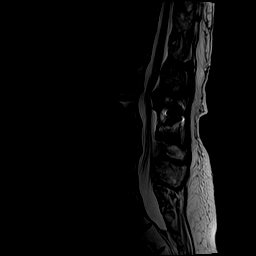
[im 10/17]
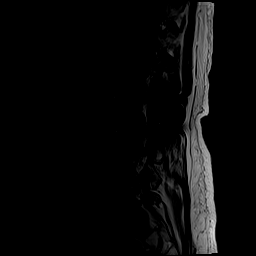
[im 13/17]
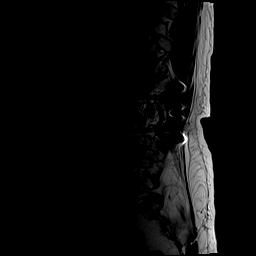
[im 17/17]
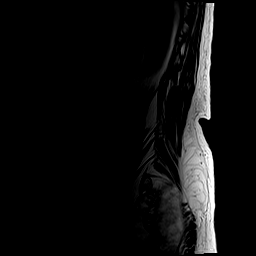

[Series 5: T1 · sagittal · 4.0mm · 1.09mm/px · 6 of 17 slices shown (1 of 2)]
[im 1/17]
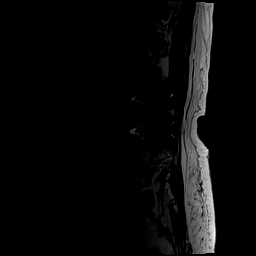
[im 4/17]
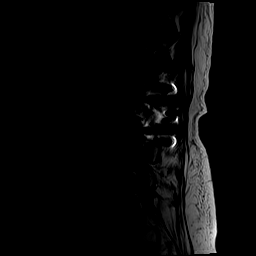
[im 7/17]
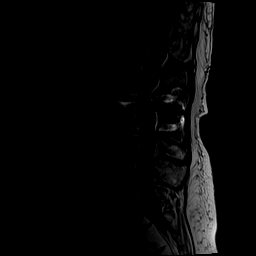
[im 10/17]
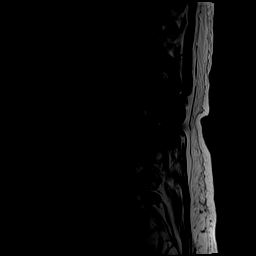
[im 13/17]
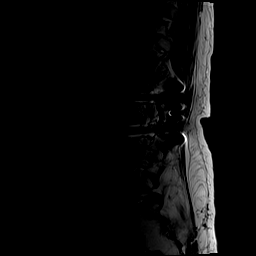
[im 17/17]
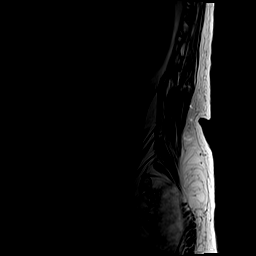

[Series 6: T2 · axial · 4.0mm · 0.39mm/px · z∈[-102,+114]mm · 9 of 42 slices shown (2 of 2)]
[im 1/42]
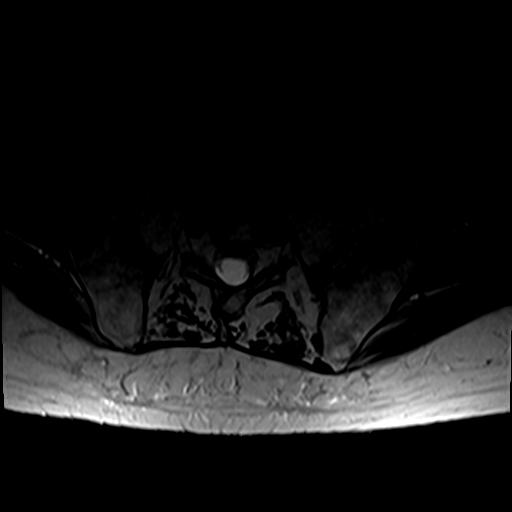
[im 6/42]
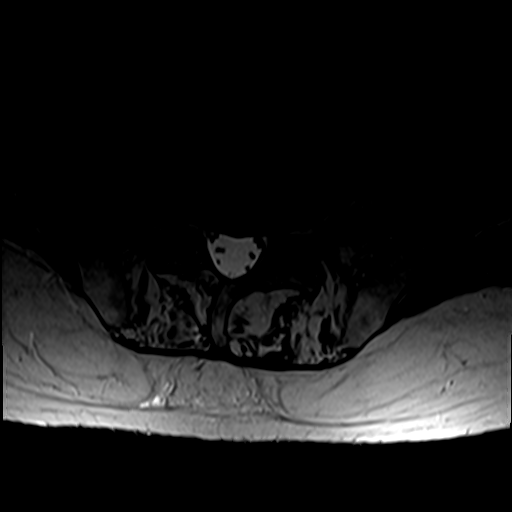
[im 12/42]
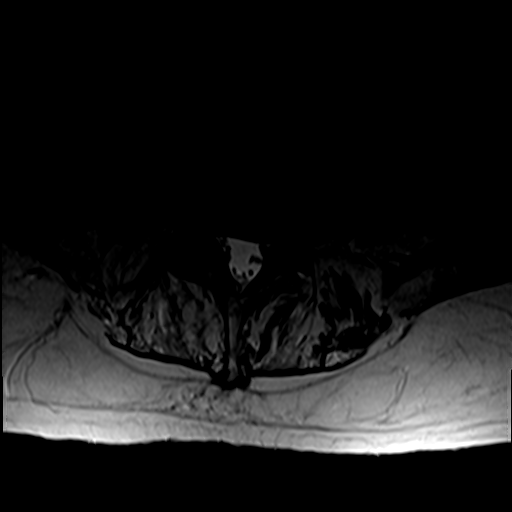
[im 18/42]
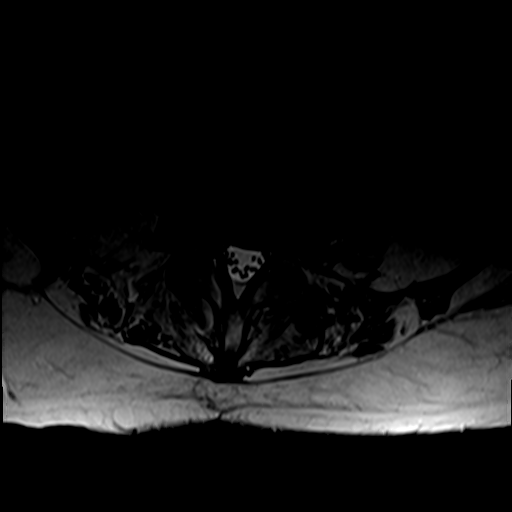
[im 21/42]
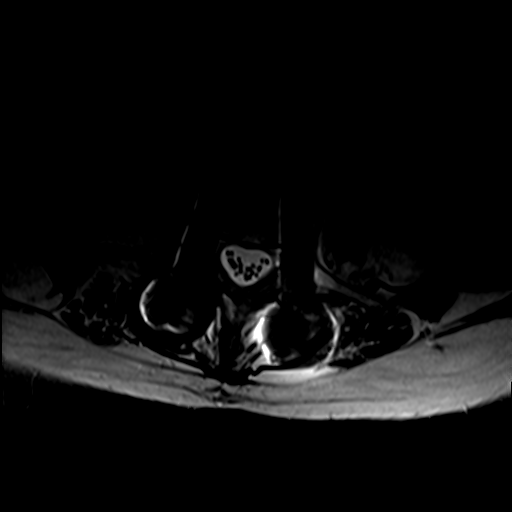
[im 24/42]
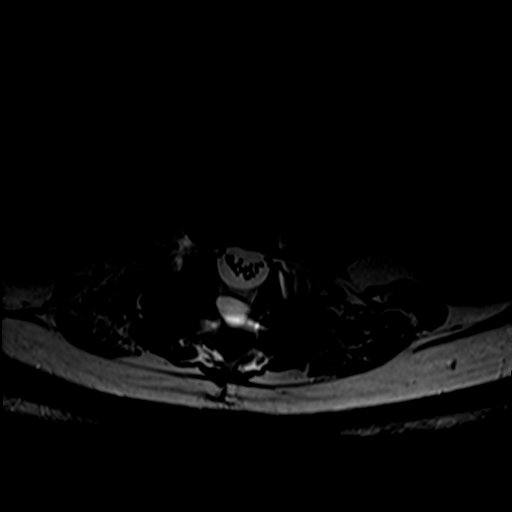
[im 30/42]
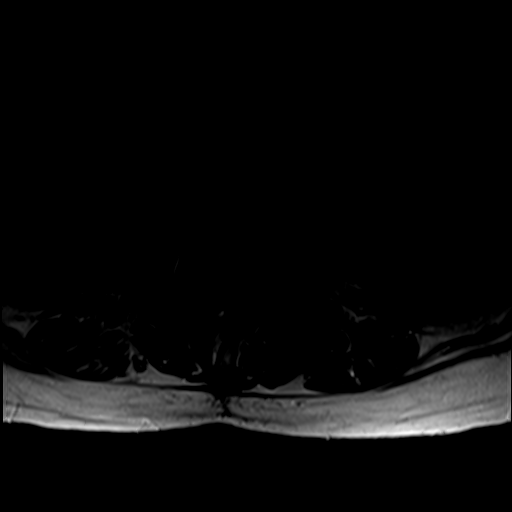
[im 36/42]
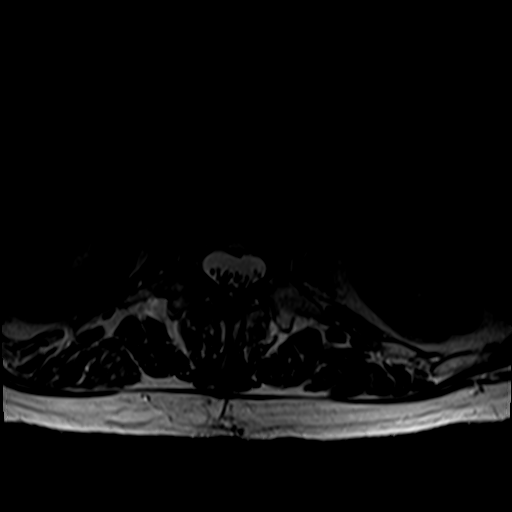
[im 42/42]
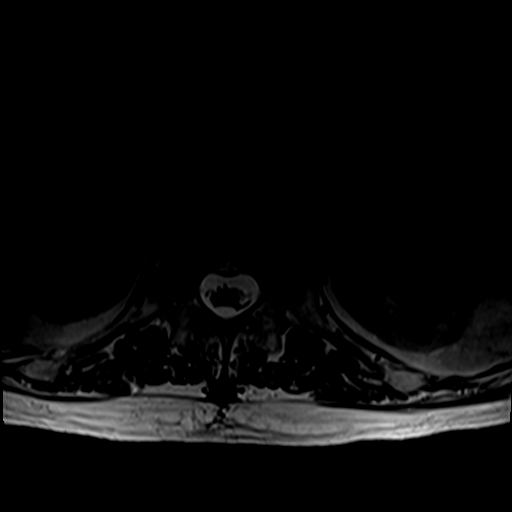

[Series 7: T1 · axial · 4.0mm · 0.39mm/px · z∈[-102,+85]mm · 6 of 42 slices shown (2 of 2)]
[im 1/42]
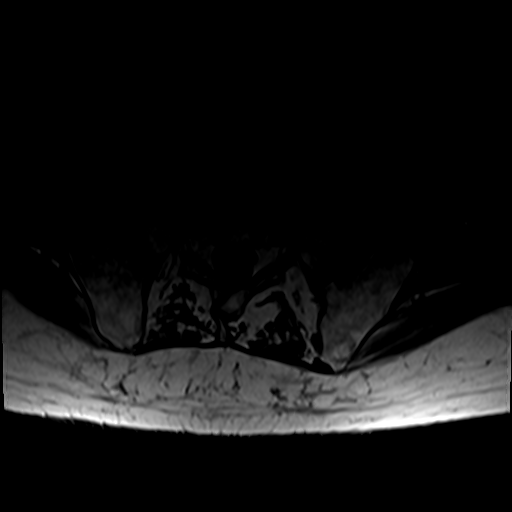
[im 6/42]
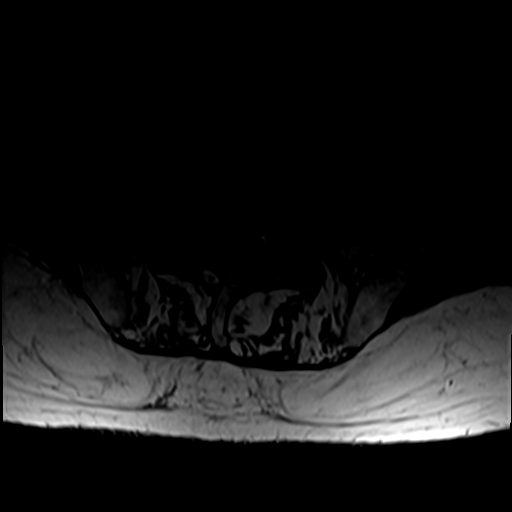
[im 12/42]
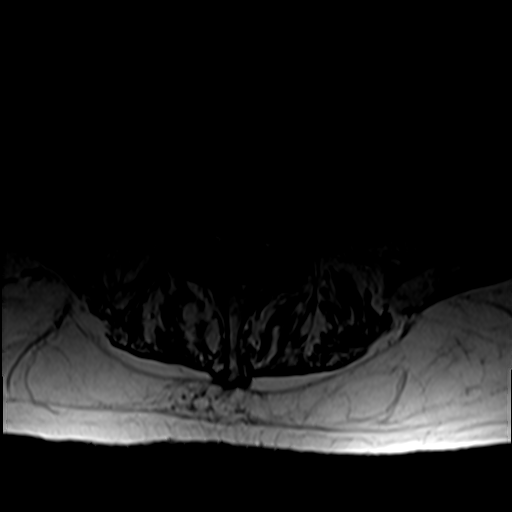
[im 18/42]
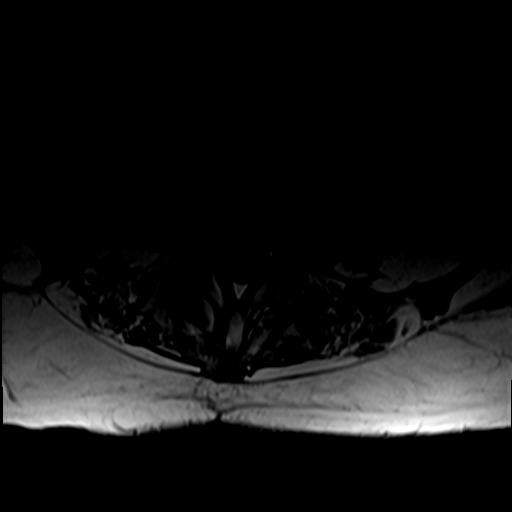
[im 21/42]
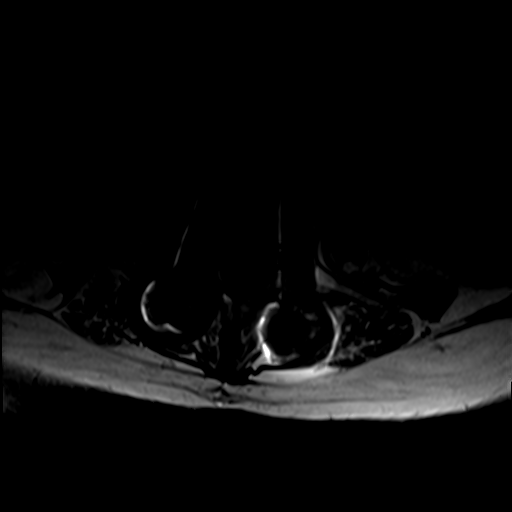
[im 36/42]
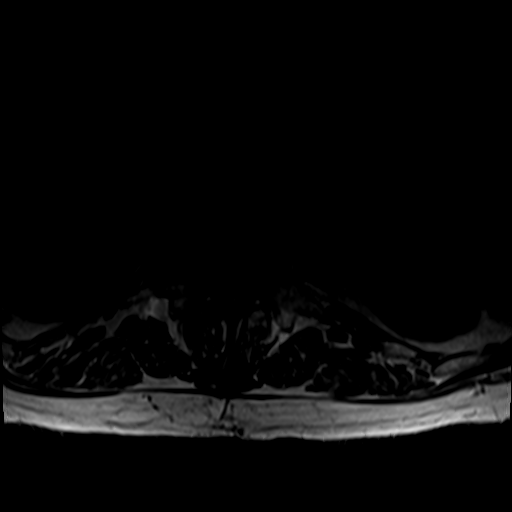

[27 of 48 positions shown; findings below may reference images not displayed]

FINDINGS: Segmentation:  Standard.

Alignment:  Mild multilevel degenerative listhesis.

Vertebrae: Vertebral body heights are similar with multilevel
degenerative endplate irregularity. Minor degenerative endplate
marrow edema at L3-L4. No suspicious osseous lesion. There are
postoperative changes at L2-L3 with rods and pedicle screws. The
hardware is not well evaluated on this study and there is associated
susceptibility artifact.

Conus medullaris and cauda equina: Conus extends to the superior L1
level. Conus and cauda equina appear normal.

Paraspinal and other soft tissues: Unremarkable.

Disc levels:

T12-L1: Disc bulge with endplate osteophytic ridging. No canal
stenosis. Minor foraminal stenosis.

L1-L2: Disc bulge with endplate osteophytic ridging. Mild facet
arthropathy. Increased mild canal stenosis. Increased mild right
foraminal stenosis. No left foraminal stenosis

L2-L3: Operative level. Posterior decompression. Disc bulge with
endplate osteophytic ridging. Canal and subarticular recesses are
decompressed. No foraminal stenosis.

L3-L4: Disc bulge with endplate osteophytic ridging and superimposed
new right foraminal/extraforaminal protrusion. No canal stenosis.
Slight effacement of the subarticular recesses. Increased marked
right foraminal stenosis laterally with potential compression of
exiting L3 nerve root. Mild left foraminal stenosis.

L4-L5: Disc bulge with endplate osteophytic ridging. No canal
stenosis. Slight effacement of the subarticular recesses. Mild to
moderate foraminal stenosis, right greater than left.

L5-S1: Disc bulge with endplate osteophytic ridging eccentric to the
left. No canal stenosis. Minor right and mild to moderate left
foraminal stenosis.
IMPRESSION: Degenerative and postoperative changes as detailed above. Stenosis
at the operative level is improved. There is potential compression
of the exiting right L3 nerve root at L3-L4 secondary to a disc
herniation.

## 2020-12-13 ENCOUNTER — Encounter: Payer: Self-pay | Admitting: Orthopaedic Surgery

## 2020-12-14 ENCOUNTER — Encounter: Payer: Self-pay | Admitting: Orthopaedic Surgery

## 2020-12-16 NOTE — Telephone Encounter (Signed)
Pt aware of the results and has scheduled an appt with the spine surgeon at [facility]

## 2020-12-16 NOTE — Telephone Encounter (Signed)
Sent response via another email

## 2020-12-16 NOTE — Telephone Encounter (Signed)
noted 

## 2020-12-18 DIAGNOSIS — M5116 Intervertebral disc disorders with radiculopathy, lumbar region: Secondary | ICD-10-CM | POA: Diagnosis not present

## 2020-12-18 DIAGNOSIS — Z79899 Other long term (current) drug therapy: Secondary | ICD-10-CM | POA: Diagnosis not present

## 2020-12-18 DIAGNOSIS — N189 Chronic kidney disease, unspecified: Secondary | ICD-10-CM | POA: Diagnosis not present

## 2020-12-18 DIAGNOSIS — M5126 Other intervertebral disc displacement, lumbar region: Secondary | ICD-10-CM | POA: Diagnosis not present

## 2020-12-18 DIAGNOSIS — M5416 Radiculopathy, lumbar region: Secondary | ICD-10-CM | POA: Diagnosis not present

## 2020-12-18 DIAGNOSIS — Z87891 Personal history of nicotine dependence: Secondary | ICD-10-CM | POA: Diagnosis not present

## 2020-12-19 ENCOUNTER — Encounter: Payer: Self-pay | Admitting: Orthopaedic Surgery

## 2020-12-24 DIAGNOSIS — M549 Dorsalgia, unspecified: Secondary | ICD-10-CM | POA: Diagnosis not present

## 2020-12-24 DIAGNOSIS — H353130 Nonexudative age-related macular degeneration, bilateral, stage unspecified: Secondary | ICD-10-CM | POA: Diagnosis not present

## 2020-12-24 DIAGNOSIS — J029 Acute pharyngitis, unspecified: Secondary | ICD-10-CM | POA: Diagnosis not present

## 2020-12-24 DIAGNOSIS — Z961 Presence of intraocular lens: Secondary | ICD-10-CM | POA: Diagnosis not present

## 2020-12-24 DIAGNOSIS — H35373 Puckering of macula, bilateral: Secondary | ICD-10-CM | POA: Diagnosis not present

## 2020-12-24 DIAGNOSIS — H43393 Other vitreous opacities, bilateral: Secondary | ICD-10-CM | POA: Diagnosis not present

## 2020-12-25 DIAGNOSIS — Z03818 Encounter for observation for suspected exposure to other biological agents ruled out: Secondary | ICD-10-CM | POA: Diagnosis not present

## 2020-12-25 DIAGNOSIS — J029 Acute pharyngitis, unspecified: Secondary | ICD-10-CM | POA: Diagnosis not present

## 2020-12-25 DIAGNOSIS — R519 Headache, unspecified: Secondary | ICD-10-CM | POA: Diagnosis not present

## 2020-12-30 ENCOUNTER — Encounter: Payer: Self-pay | Admitting: Orthopaedic Surgery

## 2020-12-30 DIAGNOSIS — E538 Deficiency of other specified B group vitamins: Secondary | ICD-10-CM | POA: Diagnosis not present

## 2020-12-30 DIAGNOSIS — Z13220 Encounter for screening for lipoid disorders: Secondary | ICD-10-CM | POA: Diagnosis not present

## 2020-12-30 DIAGNOSIS — Z01812 Encounter for preprocedural laboratory examination: Secondary | ICD-10-CM | POA: Diagnosis not present

## 2020-12-30 DIAGNOSIS — Z5181 Encounter for therapeutic drug level monitoring: Secondary | ICD-10-CM | POA: Diagnosis not present

## 2020-12-30 DIAGNOSIS — Z01818 Encounter for other preprocedural examination: Secondary | ICD-10-CM | POA: Diagnosis not present

## 2021-01-01 DIAGNOSIS — M5416 Radiculopathy, lumbar region: Secondary | ICD-10-CM | POA: Diagnosis not present

## 2021-01-01 DIAGNOSIS — M5126 Other intervertebral disc displacement, lumbar region: Secondary | ICD-10-CM | POA: Diagnosis not present

## 2021-01-07 DIAGNOSIS — D1801 Hemangioma of skin and subcutaneous tissue: Secondary | ICD-10-CM | POA: Diagnosis not present

## 2021-01-07 DIAGNOSIS — L821 Other seborrheic keratosis: Secondary | ICD-10-CM | POA: Diagnosis not present

## 2021-01-07 DIAGNOSIS — Z85828 Personal history of other malignant neoplasm of skin: Secondary | ICD-10-CM | POA: Diagnosis not present

## 2021-01-07 DIAGNOSIS — D225 Melanocytic nevi of trunk: Secondary | ICD-10-CM | POA: Diagnosis not present

## 2021-01-07 DIAGNOSIS — L570 Actinic keratosis: Secondary | ICD-10-CM | POA: Diagnosis not present

## 2021-01-22 DIAGNOSIS — L820 Inflamed seborrheic keratosis: Secondary | ICD-10-CM | POA: Diagnosis not present

## 2021-01-22 DIAGNOSIS — Z85828 Personal history of other malignant neoplasm of skin: Secondary | ICD-10-CM | POA: Diagnosis not present

## 2021-01-31 DIAGNOSIS — M5116 Intervertebral disc disorders with radiculopathy, lumbar region: Secondary | ICD-10-CM | POA: Diagnosis not present

## 2021-01-31 DIAGNOSIS — Z01818 Encounter for other preprocedural examination: Secondary | ICD-10-CM | POA: Diagnosis not present

## 2021-01-31 DIAGNOSIS — M79606 Pain in leg, unspecified: Secondary | ICD-10-CM | POA: Diagnosis not present

## 2021-01-31 DIAGNOSIS — M5126 Other intervertebral disc displacement, lumbar region: Secondary | ICD-10-CM | POA: Diagnosis not present

## 2021-01-31 DIAGNOSIS — Z9889 Other specified postprocedural states: Secondary | ICD-10-CM | POA: Diagnosis not present

## 2021-01-31 DIAGNOSIS — M48061 Spinal stenosis, lumbar region without neurogenic claudication: Secondary | ICD-10-CM | POA: Diagnosis not present

## 2021-01-31 DIAGNOSIS — Z981 Arthrodesis status: Secondary | ICD-10-CM | POA: Diagnosis not present

## 2021-02-03 DIAGNOSIS — Z20822 Contact with and (suspected) exposure to covid-19: Secondary | ICD-10-CM | POA: Diagnosis not present

## 2021-02-06 DIAGNOSIS — M5126 Other intervertebral disc displacement, lumbar region: Secondary | ICD-10-CM | POA: Diagnosis not present

## 2021-02-06 DIAGNOSIS — M48062 Spinal stenosis, lumbar region with neurogenic claudication: Secondary | ICD-10-CM | POA: Diagnosis not present

## 2021-02-06 DIAGNOSIS — N189 Chronic kidney disease, unspecified: Secondary | ICD-10-CM | POA: Diagnosis not present

## 2021-02-06 DIAGNOSIS — Z20822 Contact with and (suspected) exposure to covid-19: Secondary | ICD-10-CM | POA: Diagnosis not present

## 2021-02-06 DIAGNOSIS — M5416 Radiculopathy, lumbar region: Secondary | ICD-10-CM | POA: Diagnosis not present

## 2021-02-06 DIAGNOSIS — M5116 Intervertebral disc disorders with radiculopathy, lumbar region: Secondary | ICD-10-CM | POA: Diagnosis not present

## 2021-02-06 DIAGNOSIS — M47896 Other spondylosis, lumbar region: Secondary | ICD-10-CM | POA: Diagnosis not present

## 2021-02-06 DIAGNOSIS — H353290 Exudative age-related macular degeneration, unspecified eye, stage unspecified: Secondary | ICD-10-CM | POA: Diagnosis not present

## 2021-02-06 DIAGNOSIS — M810 Age-related osteoporosis without current pathological fracture: Secondary | ICD-10-CM | POA: Diagnosis not present

## 2021-02-06 DIAGNOSIS — K219 Gastro-esophageal reflux disease without esophagitis: Secondary | ICD-10-CM | POA: Diagnosis not present

## 2021-02-06 DIAGNOSIS — Z79899 Other long term (current) drug therapy: Secondary | ICD-10-CM | POA: Diagnosis not present

## 2021-02-06 DIAGNOSIS — Z791 Long term (current) use of non-steroidal anti-inflammatories (NSAID): Secondary | ICD-10-CM | POA: Diagnosis not present

## 2021-02-06 DIAGNOSIS — N401 Enlarged prostate with lower urinary tract symptoms: Secondary | ICD-10-CM | POA: Diagnosis not present

## 2021-02-06 DIAGNOSIS — Z87891 Personal history of nicotine dependence: Secondary | ICD-10-CM | POA: Diagnosis not present

## 2021-02-14 DIAGNOSIS — U071 COVID-19: Secondary | ICD-10-CM | POA: Diagnosis not present

## 2021-02-24 DIAGNOSIS — M5137 Other intervertebral disc degeneration, lumbosacral region: Secondary | ICD-10-CM | POA: Diagnosis not present

## 2021-02-24 DIAGNOSIS — Z87891 Personal history of nicotine dependence: Secondary | ICD-10-CM | POA: Diagnosis not present

## 2021-02-24 DIAGNOSIS — M1610 Unilateral primary osteoarthritis, unspecified hip: Secondary | ICD-10-CM | POA: Diagnosis not present

## 2021-02-24 DIAGNOSIS — M25562 Pain in left knee: Secondary | ICD-10-CM | POA: Diagnosis not present

## 2021-02-24 DIAGNOSIS — Z79899 Other long term (current) drug therapy: Secondary | ICD-10-CM | POA: Diagnosis not present

## 2021-02-24 DIAGNOSIS — Z791 Long term (current) use of non-steroidal anti-inflammatories (NSAID): Secondary | ICD-10-CM | POA: Diagnosis not present

## 2021-02-24 DIAGNOSIS — M4316 Spondylolisthesis, lumbar region: Secondary | ICD-10-CM | POA: Diagnosis not present

## 2021-02-24 DIAGNOSIS — M48061 Spinal stenosis, lumbar region without neurogenic claudication: Secondary | ICD-10-CM | POA: Diagnosis not present

## 2021-02-24 DIAGNOSIS — N189 Chronic kidney disease, unspecified: Secondary | ICD-10-CM | POA: Diagnosis not present

## 2021-02-24 DIAGNOSIS — Z981 Arthrodesis status: Secondary | ICD-10-CM | POA: Diagnosis not present

## 2021-02-24 DIAGNOSIS — M5126 Other intervertebral disc displacement, lumbar region: Secondary | ICD-10-CM | POA: Diagnosis not present

## 2021-02-28 DIAGNOSIS — R350 Frequency of micturition: Secondary | ICD-10-CM | POA: Diagnosis not present

## 2021-02-28 DIAGNOSIS — R110 Nausea: Secondary | ICD-10-CM | POA: Diagnosis not present

## 2021-03-10 DIAGNOSIS — S83282A Other tear of lateral meniscus, current injury, left knee, initial encounter: Secondary | ICD-10-CM | POA: Diagnosis not present

## 2021-03-10 DIAGNOSIS — Z791 Long term (current) use of non-steroidal anti-inflammatories (NSAID): Secondary | ICD-10-CM | POA: Diagnosis not present

## 2021-03-10 DIAGNOSIS — Z79899 Other long term (current) drug therapy: Secondary | ICD-10-CM | POA: Diagnosis not present

## 2021-03-10 DIAGNOSIS — R1030 Lower abdominal pain, unspecified: Secondary | ICD-10-CM | POA: Diagnosis not present

## 2021-03-10 DIAGNOSIS — M1610 Unilateral primary osteoarthritis, unspecified hip: Secondary | ICD-10-CM | POA: Diagnosis not present

## 2021-03-10 DIAGNOSIS — M25462 Effusion, left knee: Secondary | ICD-10-CM | POA: Diagnosis not present

## 2021-03-10 DIAGNOSIS — M4327 Fusion of spine, lumbosacral region: Secondary | ICD-10-CM | POA: Diagnosis not present

## 2021-03-10 DIAGNOSIS — N189 Chronic kidney disease, unspecified: Secondary | ICD-10-CM | POA: Diagnosis not present

## 2021-03-10 DIAGNOSIS — M7122 Synovial cyst of popliteal space [Baker], left knee: Secondary | ICD-10-CM | POA: Diagnosis not present

## 2021-03-10 DIAGNOSIS — M48061 Spinal stenosis, lumbar region without neurogenic claudication: Secondary | ICD-10-CM | POA: Diagnosis not present

## 2021-03-10 DIAGNOSIS — M25562 Pain in left knee: Secondary | ICD-10-CM | POA: Diagnosis not present

## 2021-03-10 DIAGNOSIS — Z886 Allergy status to analgesic agent status: Secondary | ICD-10-CM | POA: Diagnosis not present

## 2021-03-11 ENCOUNTER — Other Ambulatory Visit: Payer: Self-pay | Admitting: Internal Medicine

## 2021-03-21 DIAGNOSIS — M545 Low back pain, unspecified: Secondary | ICD-10-CM | POA: Diagnosis not present

## 2021-03-21 DIAGNOSIS — M25559 Pain in unspecified hip: Secondary | ICD-10-CM | POA: Diagnosis not present

## 2021-03-21 DIAGNOSIS — M4327 Fusion of spine, lumbosacral region: Secondary | ICD-10-CM | POA: Diagnosis not present

## 2021-03-25 DIAGNOSIS — M25559 Pain in unspecified hip: Secondary | ICD-10-CM | POA: Diagnosis not present

## 2021-03-25 DIAGNOSIS — M545 Low back pain, unspecified: Secondary | ICD-10-CM | POA: Diagnosis not present

## 2021-03-25 DIAGNOSIS — M4327 Fusion of spine, lumbosacral region: Secondary | ICD-10-CM | POA: Diagnosis not present

## 2021-03-27 DIAGNOSIS — M4327 Fusion of spine, lumbosacral region: Secondary | ICD-10-CM | POA: Diagnosis not present

## 2021-03-27 DIAGNOSIS — M25559 Pain in unspecified hip: Secondary | ICD-10-CM | POA: Diagnosis not present

## 2021-03-27 DIAGNOSIS — M545 Low back pain, unspecified: Secondary | ICD-10-CM | POA: Diagnosis not present

## 2021-03-31 DIAGNOSIS — M4327 Fusion of spine, lumbosacral region: Secondary | ICD-10-CM | POA: Diagnosis not present

## 2021-03-31 DIAGNOSIS — M545 Low back pain, unspecified: Secondary | ICD-10-CM | POA: Diagnosis not present

## 2021-03-31 DIAGNOSIS — M25559 Pain in unspecified hip: Secondary | ICD-10-CM | POA: Diagnosis not present

## 2021-04-07 DIAGNOSIS — M25559 Pain in unspecified hip: Secondary | ICD-10-CM | POA: Diagnosis not present

## 2021-04-07 DIAGNOSIS — M4327 Fusion of spine, lumbosacral region: Secondary | ICD-10-CM | POA: Diagnosis not present

## 2021-04-07 DIAGNOSIS — M545 Low back pain, unspecified: Secondary | ICD-10-CM | POA: Diagnosis not present

## 2021-04-10 DIAGNOSIS — M4327 Fusion of spine, lumbosacral region: Secondary | ICD-10-CM | POA: Diagnosis not present

## 2021-04-10 DIAGNOSIS — M545 Low back pain, unspecified: Secondary | ICD-10-CM | POA: Diagnosis not present

## 2021-04-10 DIAGNOSIS — M25559 Pain in unspecified hip: Secondary | ICD-10-CM | POA: Diagnosis not present

## 2021-04-14 DIAGNOSIS — M85851 Other specified disorders of bone density and structure, right thigh: Secondary | ICD-10-CM | POA: Diagnosis not present

## 2021-04-14 DIAGNOSIS — Z09 Encounter for follow-up examination after completed treatment for conditions other than malignant neoplasm: Secondary | ICD-10-CM | POA: Diagnosis not present

## 2021-04-14 DIAGNOSIS — M47816 Spondylosis without myelopathy or radiculopathy, lumbar region: Secondary | ICD-10-CM | POA: Diagnosis not present

## 2021-04-14 DIAGNOSIS — G629 Polyneuropathy, unspecified: Secondary | ICD-10-CM | POA: Diagnosis not present

## 2021-04-14 DIAGNOSIS — Z87891 Personal history of nicotine dependence: Secondary | ICD-10-CM | POA: Diagnosis not present

## 2021-04-14 DIAGNOSIS — N189 Chronic kidney disease, unspecified: Secondary | ICD-10-CM | POA: Diagnosis not present

## 2021-04-14 DIAGNOSIS — Z981 Arthrodesis status: Secondary | ICD-10-CM | POA: Diagnosis not present

## 2021-04-14 DIAGNOSIS — Z79899 Other long term (current) drug therapy: Secondary | ICD-10-CM | POA: Diagnosis not present

## 2021-04-14 DIAGNOSIS — M5136 Other intervertebral disc degeneration, lumbar region: Secondary | ICD-10-CM | POA: Diagnosis not present

## 2021-04-14 DIAGNOSIS — M160 Bilateral primary osteoarthritis of hip: Secondary | ICD-10-CM | POA: Diagnosis not present

## 2021-04-14 DIAGNOSIS — M1611 Unilateral primary osteoarthritis, right hip: Secondary | ICD-10-CM | POA: Diagnosis not present

## 2021-04-15 DIAGNOSIS — M4327 Fusion of spine, lumbosacral region: Secondary | ICD-10-CM | POA: Diagnosis not present

## 2021-04-15 DIAGNOSIS — M25559 Pain in unspecified hip: Secondary | ICD-10-CM | POA: Diagnosis not present

## 2021-04-15 DIAGNOSIS — M545 Low back pain, unspecified: Secondary | ICD-10-CM | POA: Diagnosis not present

## 2021-04-18 DIAGNOSIS — M25559 Pain in unspecified hip: Secondary | ICD-10-CM | POA: Diagnosis not present

## 2021-04-18 DIAGNOSIS — M545 Low back pain, unspecified: Secondary | ICD-10-CM | POA: Diagnosis not present

## 2021-04-18 DIAGNOSIS — M4327 Fusion of spine, lumbosacral region: Secondary | ICD-10-CM | POA: Diagnosis not present

## 2021-04-21 DIAGNOSIS — M545 Low back pain, unspecified: Secondary | ICD-10-CM | POA: Diagnosis not present

## 2021-04-21 DIAGNOSIS — M25559 Pain in unspecified hip: Secondary | ICD-10-CM | POA: Diagnosis not present

## 2021-04-21 DIAGNOSIS — M4327 Fusion of spine, lumbosacral region: Secondary | ICD-10-CM | POA: Diagnosis not present

## 2021-04-24 DIAGNOSIS — M4327 Fusion of spine, lumbosacral region: Secondary | ICD-10-CM | POA: Diagnosis not present

## 2021-04-24 DIAGNOSIS — M25559 Pain in unspecified hip: Secondary | ICD-10-CM | POA: Diagnosis not present

## 2021-04-24 DIAGNOSIS — M545 Low back pain, unspecified: Secondary | ICD-10-CM | POA: Diagnosis not present

## 2021-04-28 DIAGNOSIS — M4327 Fusion of spine, lumbosacral region: Secondary | ICD-10-CM | POA: Diagnosis not present

## 2021-04-28 DIAGNOSIS — M25559 Pain in unspecified hip: Secondary | ICD-10-CM | POA: Diagnosis not present

## 2021-04-28 DIAGNOSIS — M545 Low back pain, unspecified: Secondary | ICD-10-CM | POA: Diagnosis not present

## 2021-05-01 DIAGNOSIS — M25559 Pain in unspecified hip: Secondary | ICD-10-CM | POA: Diagnosis not present

## 2021-05-01 DIAGNOSIS — M545 Low back pain, unspecified: Secondary | ICD-10-CM | POA: Diagnosis not present

## 2021-05-01 DIAGNOSIS — M4327 Fusion of spine, lumbosacral region: Secondary | ICD-10-CM | POA: Diagnosis not present

## 2021-05-05 DIAGNOSIS — M545 Low back pain, unspecified: Secondary | ICD-10-CM | POA: Diagnosis not present

## 2021-05-05 DIAGNOSIS — M25559 Pain in unspecified hip: Secondary | ICD-10-CM | POA: Diagnosis not present

## 2021-05-05 DIAGNOSIS — M4327 Fusion of spine, lumbosacral region: Secondary | ICD-10-CM | POA: Diagnosis not present

## 2021-05-08 DIAGNOSIS — M4327 Fusion of spine, lumbosacral region: Secondary | ICD-10-CM | POA: Diagnosis not present

## 2021-05-08 DIAGNOSIS — M25559 Pain in unspecified hip: Secondary | ICD-10-CM | POA: Diagnosis not present

## 2021-05-08 DIAGNOSIS — M545 Low back pain, unspecified: Secondary | ICD-10-CM | POA: Diagnosis not present

## 2021-05-12 DIAGNOSIS — M25559 Pain in unspecified hip: Secondary | ICD-10-CM | POA: Diagnosis not present

## 2021-05-12 DIAGNOSIS — M25562 Pain in left knee: Secondary | ICD-10-CM | POA: Diagnosis not present

## 2021-05-12 DIAGNOSIS — M4327 Fusion of spine, lumbosacral region: Secondary | ICD-10-CM | POA: Diagnosis not present

## 2021-05-12 DIAGNOSIS — G8929 Other chronic pain: Secondary | ICD-10-CM | POA: Diagnosis not present

## 2021-05-13 DIAGNOSIS — M545 Low back pain, unspecified: Secondary | ICD-10-CM | POA: Diagnosis not present

## 2021-05-13 DIAGNOSIS — M25559 Pain in unspecified hip: Secondary | ICD-10-CM | POA: Diagnosis not present

## 2021-05-13 DIAGNOSIS — M4327 Fusion of spine, lumbosacral region: Secondary | ICD-10-CM | POA: Diagnosis not present

## 2021-05-15 DIAGNOSIS — M2550 Pain in unspecified joint: Secondary | ICD-10-CM | POA: Diagnosis not present

## 2021-05-15 DIAGNOSIS — R0789 Other chest pain: Secondary | ICD-10-CM | POA: Diagnosis not present

## 2021-05-15 DIAGNOSIS — M545 Low back pain, unspecified: Secondary | ICD-10-CM | POA: Diagnosis not present

## 2021-05-19 ENCOUNTER — Other Ambulatory Visit: Payer: Self-pay | Admitting: Family Medicine

## 2021-05-19 DIAGNOSIS — R0789 Other chest pain: Secondary | ICD-10-CM

## 2021-05-28 DIAGNOSIS — M810 Age-related osteoporosis without current pathological fracture: Secondary | ICD-10-CM | POA: Diagnosis not present

## 2021-05-28 DIAGNOSIS — R351 Nocturia: Secondary | ICD-10-CM | POA: Diagnosis not present

## 2021-05-28 DIAGNOSIS — R300 Dysuria: Secondary | ICD-10-CM | POA: Diagnosis not present

## 2021-05-28 DIAGNOSIS — N401 Enlarged prostate with lower urinary tract symptoms: Secondary | ICD-10-CM | POA: Diagnosis not present

## 2021-06-10 ENCOUNTER — Ambulatory Visit: Admission: RE | Admit: 2021-06-10 | Payer: Medicare PPO | Source: Ambulatory Visit

## 2021-06-10 ENCOUNTER — Other Ambulatory Visit: Payer: Self-pay

## 2021-06-10 ENCOUNTER — Other Ambulatory Visit: Payer: Medicare PPO

## 2021-06-10 ENCOUNTER — Ambulatory Visit
Admission: RE | Admit: 2021-06-10 | Discharge: 2021-06-10 | Disposition: A | Discharge status: Home / Self Care | Payer: Medicare PPO | Source: Ambulatory Visit | Attending: Family Medicine | Admitting: Family Medicine

## 2021-06-10 ENCOUNTER — Ambulatory Visit: Payer: Medicare PPO

## 2021-06-10 DIAGNOSIS — N644 Mastodynia: Secondary | ICD-10-CM | POA: Diagnosis not present

## 2021-06-10 DIAGNOSIS — N62 Hypertrophy of breast: Secondary | ICD-10-CM | POA: Diagnosis not present

## 2021-06-10 DIAGNOSIS — R0789 Other chest pain: Secondary | ICD-10-CM

## 2021-06-10 IMAGING — MG DIGITAL DIAGNOSTIC BILAT W/ TOMO W/ CAD
6 of 10 series · 6 of 30 positions shown · non-contrast
Comparison: None.

ACR Breast Density Category a: The breast tissue is almost entirely
fatty.

CLINICAL DATA: 89-year-old male with subareolar right breast pain
and tenderness for 3 months.

EXAM:
DIGITAL DIAGNOSTIC BILATERAL MAMMOGRAM WITH TOMOSYNTHESIS AND CAD
TECHNIQUE: Bilateral digital diagnostic mammography and breast tomosynthesis
was performed. The images were evaluated with computer-aided
detection.

[R MLO synth-2D]
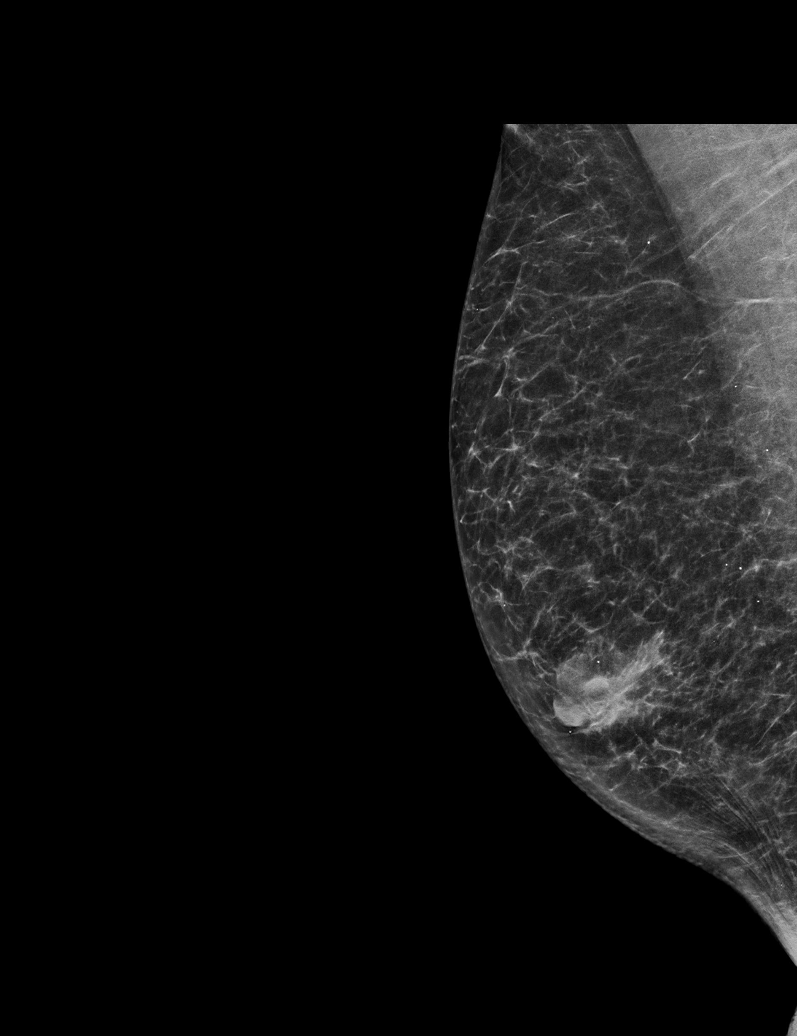

[L MLO synth-2D]
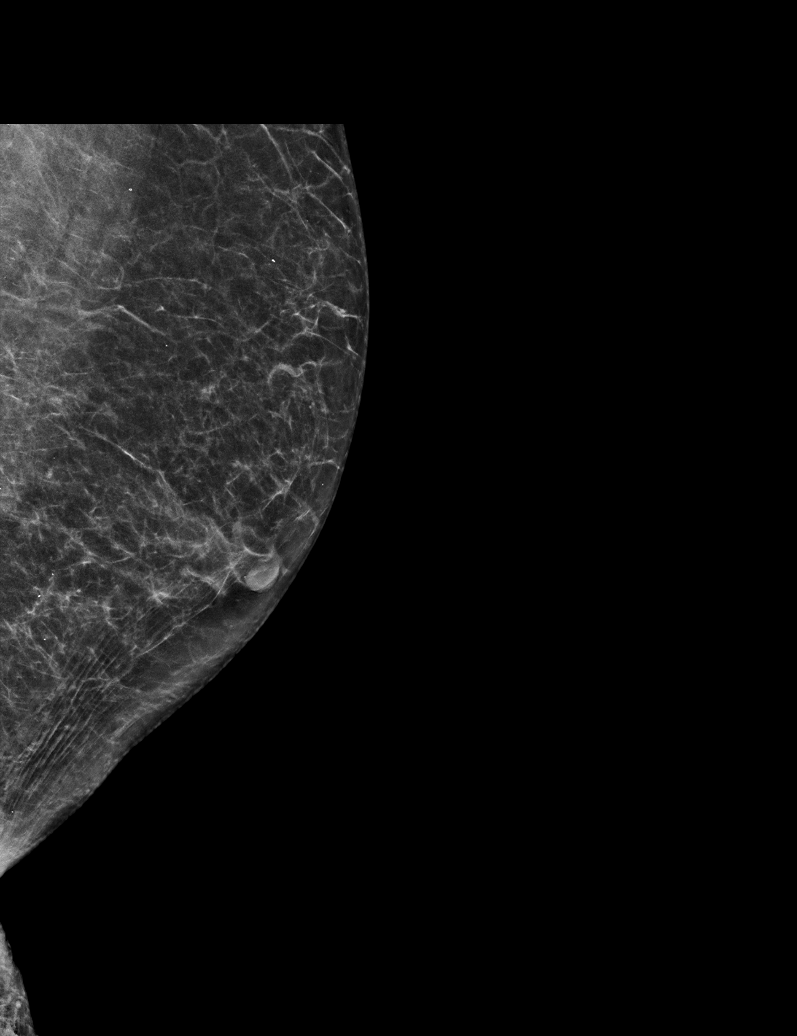

[L CC synth-2D (1 of 2)]
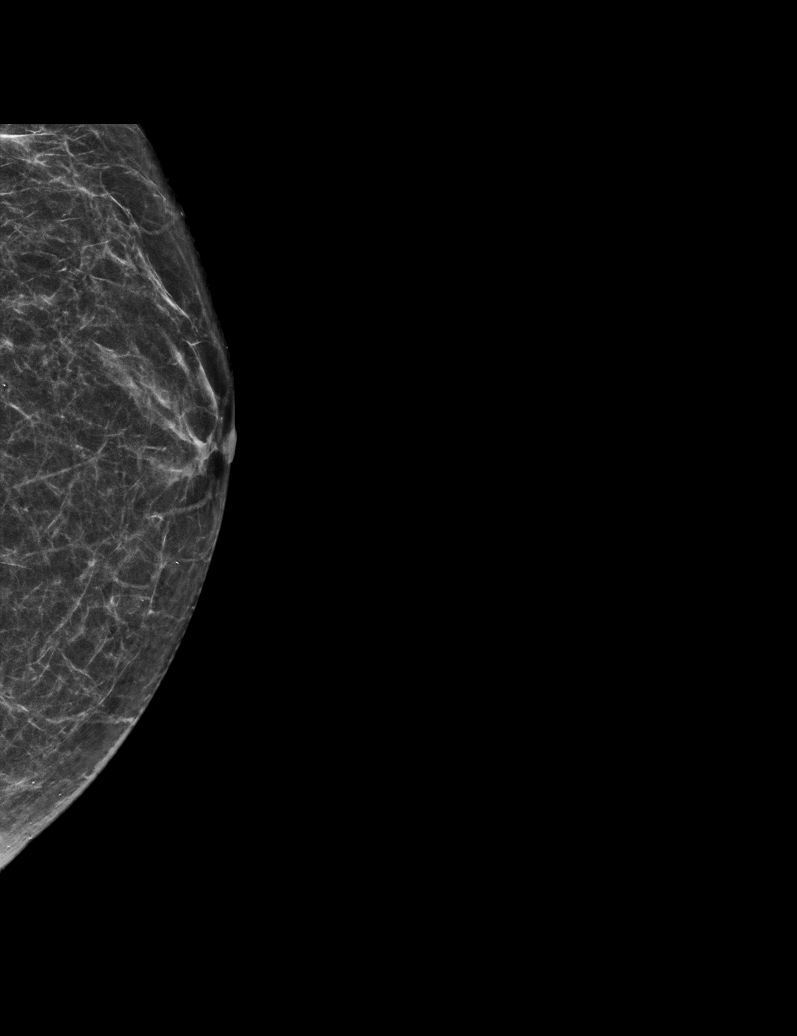

[R CC synth-2D]
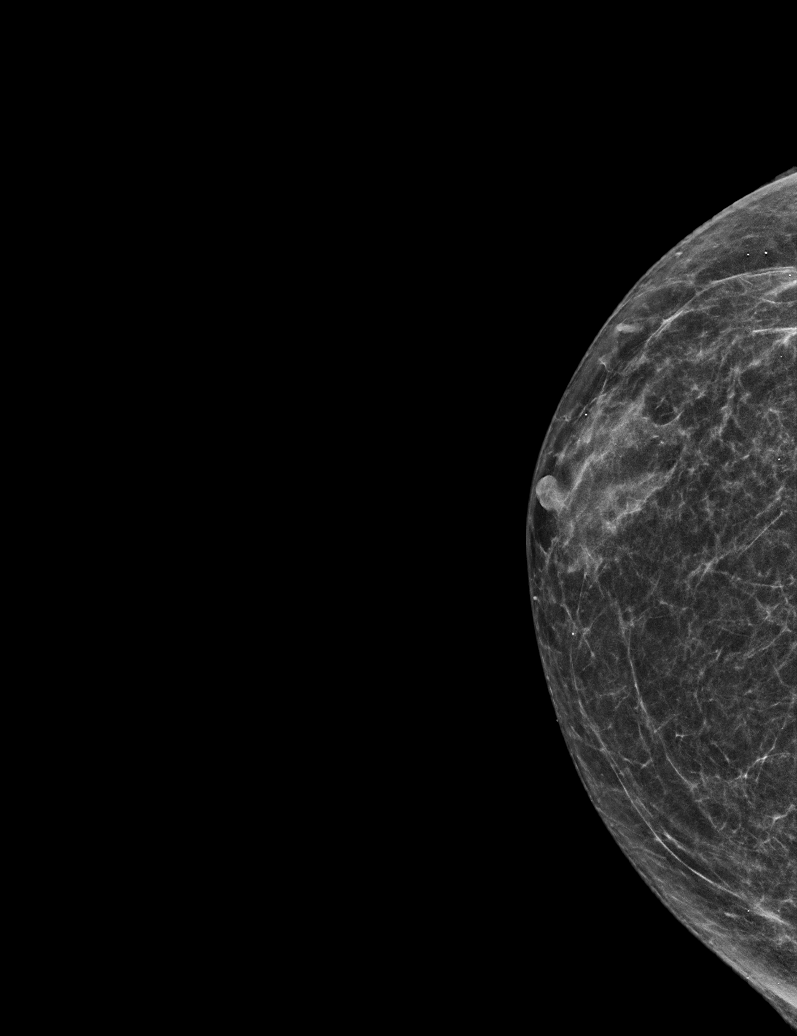

[L CC synth-2D (2 of 2)]
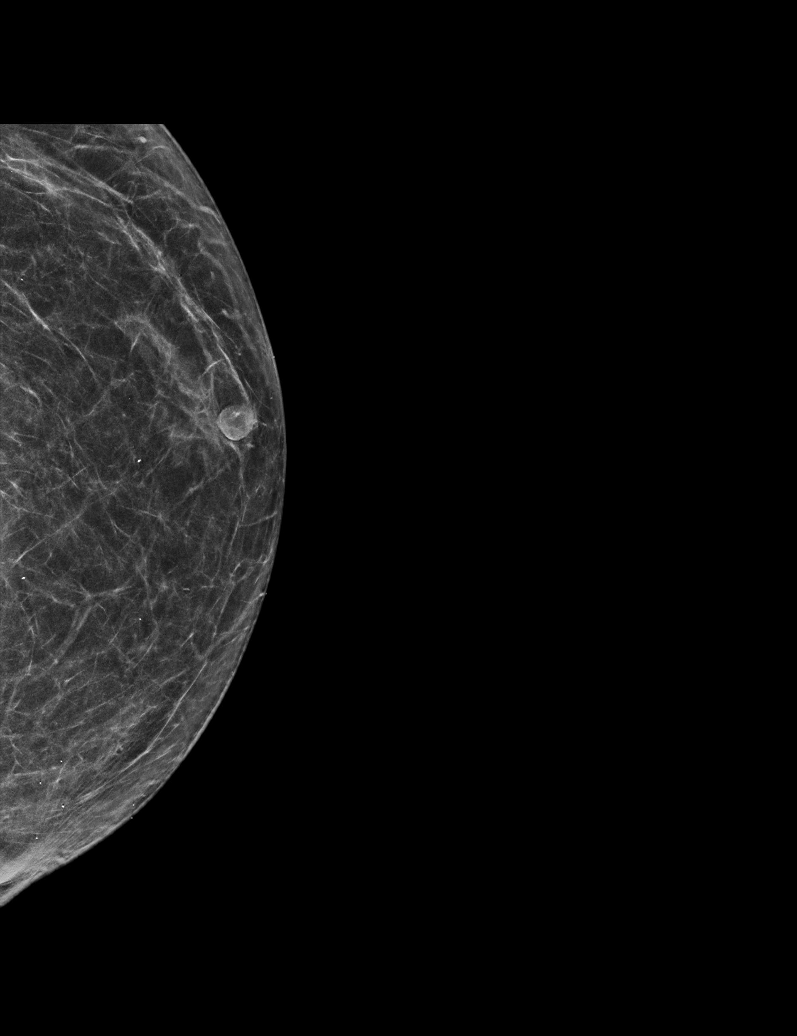

[R CC tomo · tomo slice 29/56.0]
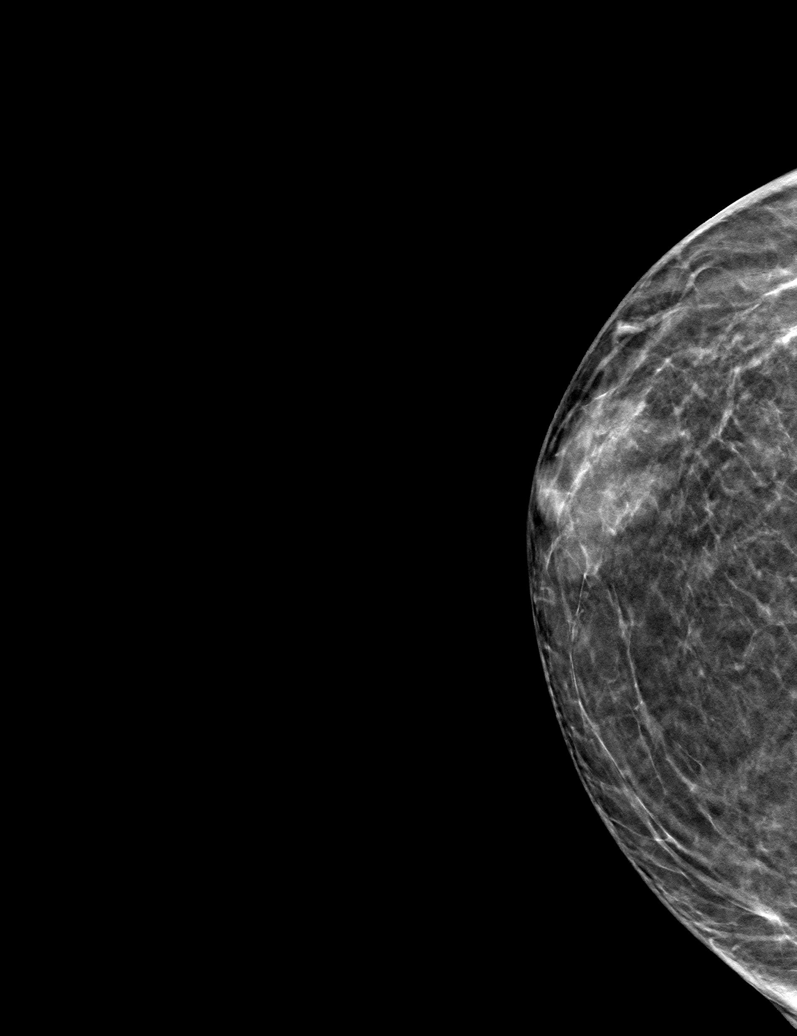

[6 of 30 positions shown; findings below may reference images not displayed]

FINDINGS: There is flame shaped fibroglandular tissue arising from the right
nipple and extending posteriorly. This is consistent with mild to
moderate right-sided gynecomastia. There is minimal left-sided
gynecomastia as well. Otherwise, no suspicious findings in either
breast.
IMPRESSION: Mild to moderate right-sided and minimal left-sided gynecomastia
accounting for the patient's symptoms. No additional suspicious
findings.

RECOMMENDATION:
I discussed with the patient the fact that gynecomastia can occur in
men as testosterone levels decrease with age or in younger men with
low testosterone levels, causing a change in the serum
testosterone:estrogen ratio. We also discussed other potential
etiologies of gynecomastia including numerous prescription
medications and recreational drugs (marijuana and anabolic steroids
in particular). Approximately 20% of cases of gynecomastia are
idiopathic.

I counseled the patient to perform self-examination to make sure
that a hard lump does not develop which could indicate malignancy
and would need further evaluation. We also discussed the possibility
of surgical excision if symptoms continue and if an etiology of the
gynecomastia cannot be determined and therefore corrected.

I have discussed the findings and recommendations with the patient.
If applicable, a reminder letter will be sent to the patient
regarding the next appointment.

BI-RADS CATEGORY  2: Benign.

## 2021-06-16 DIAGNOSIS — M4854XA Collapsed vertebra, not elsewhere classified, thoracic region, initial encounter for fracture: Secondary | ICD-10-CM | POA: Diagnosis not present

## 2021-06-16 DIAGNOSIS — M47816 Spondylosis without myelopathy or radiculopathy, lumbar region: Secondary | ICD-10-CM | POA: Diagnosis not present

## 2021-06-16 DIAGNOSIS — M47817 Spondylosis without myelopathy or radiculopathy, lumbosacral region: Secondary | ICD-10-CM | POA: Diagnosis not present

## 2021-06-16 DIAGNOSIS — Z981 Arthrodesis status: Secondary | ICD-10-CM | POA: Diagnosis not present

## 2021-06-16 DIAGNOSIS — M48061 Spinal stenosis, lumbar region without neurogenic claudication: Secondary | ICD-10-CM | POA: Diagnosis not present

## 2021-06-16 DIAGNOSIS — G629 Polyneuropathy, unspecified: Secondary | ICD-10-CM | POA: Diagnosis not present

## 2021-06-16 DIAGNOSIS — N189 Chronic kidney disease, unspecified: Secondary | ICD-10-CM | POA: Diagnosis not present

## 2021-06-16 DIAGNOSIS — R29890 Loss of height: Secondary | ICD-10-CM | POA: Diagnosis not present

## 2021-06-16 DIAGNOSIS — M4327 Fusion of spine, lumbosacral region: Secondary | ICD-10-CM | POA: Diagnosis not present

## 2021-06-25 DIAGNOSIS — D225 Melanocytic nevi of trunk: Secondary | ICD-10-CM | POA: Diagnosis not present

## 2021-06-25 DIAGNOSIS — L821 Other seborrheic keratosis: Secondary | ICD-10-CM | POA: Diagnosis not present

## 2021-06-25 DIAGNOSIS — D1801 Hemangioma of skin and subcutaneous tissue: Secondary | ICD-10-CM | POA: Diagnosis not present

## 2021-06-25 DIAGNOSIS — L570 Actinic keratosis: Secondary | ICD-10-CM | POA: Diagnosis not present

## 2021-06-25 DIAGNOSIS — Z85828 Personal history of other malignant neoplasm of skin: Secondary | ICD-10-CM | POA: Diagnosis not present

## 2021-06-26 ENCOUNTER — Other Ambulatory Visit: Payer: Medicare PPO

## 2021-07-13 HISTORY — PX: TOTAL SHOULDER ARTHROPLASTY: SHX126

## 2021-08-18 ENCOUNTER — Encounter: Payer: Self-pay | Admitting: Physician Assistant

## 2021-08-18 ENCOUNTER — Ambulatory Visit (INDEPENDENT_AMBULATORY_CARE_PROVIDER_SITE_OTHER): Payer: Medicare PPO

## 2021-08-18 ENCOUNTER — Ambulatory Visit: Payer: Medicare PPO | Admitting: Physician Assistant

## 2021-08-18 ENCOUNTER — Other Ambulatory Visit: Payer: Self-pay

## 2021-08-18 DIAGNOSIS — M542 Cervicalgia: Secondary | ICD-10-CM

## 2021-08-18 DIAGNOSIS — M25511 Pain in right shoulder: Secondary | ICD-10-CM | POA: Diagnosis not present

## 2021-08-18 MED ORDER — LIDOCAINE HCL 1 % IJ SOLN
5.0000 mL | INTRAMUSCULAR | Status: AC | PRN
  Administered 2021-08-18: 5 mL

## 2021-08-18 MED ORDER — METHYLPREDNISOLONE ACETATE 40 MG/ML IJ SUSP
80.0000 mg | INTRAMUSCULAR | Status: AC | PRN
  Administered 2021-08-18: 80 mg via INTRA_ARTICULAR

## 2021-08-18 MED ORDER — TRAMADOL HCL 50 MG PO TABS: ORAL_TABLET | ORAL | 0 refills | Status: DC

## 2021-08-18 NOTE — Progress Notes (Signed)
Office Visit Note   Patient: Lucas Shepherd           Date of Birth: Jun 12, 1929           MRN: [id number] Visit Date: 08/18/2021              Requested by: London Pepper, MD [place],  [place] [address] PCP: London Pepper, MD  Chief Complaint  Patient presents with   Right Shoulder - Pain      HPI: Patient is a pleasant 88 year old gentleman with 5-day history of right shoulder pain.  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  In 2012 records show x-rays were also taken.  He says he is having trouble sleeping at night because of this.  Has difficulty in the intermediate range when bringing his arm over his head and difficulty going behind his back.   Assessment & Plan: Visit Diagnoses:  1. Cervicalgia   2. Acute pain of right shoulder     Plan: We will go forward with an injection today findings consistent with rotator cuff inflammation.  Bursitis.  He does we will turn in 2 weeks to see if he is improved.  Also at that time we will need to obtain x-rays of his left knee which has been bothering him but he would like to address the shoulder today.  I did give him a small amount of tramadol he understands the shot may take a few days to fully take effect and he is taken tramadol before and tolerated it well.  Follow-Up Instructions: No follow-ups on file.   Ortho Exam  Patient is alert, oriented, no adenopathy, well-dressed, normal affect, normal respiratory effort. Examination demonstrates no step-off well-healed surgical scar.  He has pain with internal rotation behind his back.  He has pain while raising his arm but once its to full extension he is fine no evidence of adhesive capsulitis.  Distal CMS is intact.  Positive impingement findings.  He does have fairly good strength  Imaging: XR Shoulder Right  Result Date: 08/18/2021 Radiographs of his  right shoulder were reviewed today.  Humeral head is reduced well into the glenoid.  No cystic or significant sclerotic changes.  He does have significant arthritis of the acromioclavicular joint with hypertrophic bone and sclerotic changes  No images are attached to the encounter.  Labs: Lab Results  Component Value Date   ESRSEDRATE 10 12/28/2008   REPTSTATUS 09/21/2020 FINAL 09/19/2020   CULT (A) 09/19/2020    <10,000 COLONIES/mL INSIGNIFICANT GROWTH Performed at [facility], [place] [address] [place], [place] [address]    [place] 04/15/2015     Lab Results  Component Value Date   ALBUMIN 4.1 04/15/2015   ALBUMIN 4.0 07/24/2014   ALBUMIN 3.6 11/22/2012   ALBUMIN 3.6 11/22/2012    Lab Results  Component Value Date   MG 2.1 06/14/2011   No results found for: VD25OH  No results found for: PREALBUMIN CBC EXTENDED Latest Ref Rng & Units 09/19/2020 09/15/2019 04/15/2015  WBC 4.0 - 10.5 K/uL - 7.7 9.5  RBC 4.22 - 5.81 MIL/uL - 4.34 4.92  HGB 13.0 - 17.0 g/dL 12.9(L) 14.2 15.5  HCT 39.0 - 52.0 % 38.0(L) 42.1 46.5  PLT 150 - 400 K/uL - 208 244  NEUTROABS 1.7 - 7.7 K/uL - - 6.3  LYMPHSABS 0.7 - 4.0 K/uL - -  2.2     There is no height or weight on file to calculate BMI.  Orders:  Orders Placed This Encounter  Procedures   XR Shoulder Right   Meds ordered this encounter  Medications   traMADol (ULTRAM) 50 MG tablet    Sig: TAKE 1 TABLET BY MOUTH 2 TIMES DAILY AS NEEDED.    Dispense:  30 tablet    Refill:  0    Not to exceed 5 additional fills before 05/11/2021     Procedures: Large Joint Inj: R subacromial bursa on 08/18/2021 1:46 PM Indications: diagnostic evaluation and pain Details: 25 G 1.5 in needle, anterior approach  Arthrogram: No  Medications: 5 mL lidocaine 1 %; 80 mg methylPREDNISolone acetate 40 MG/ML Outcome: tolerated well, no immediate complications Procedure, treatment alternatives, risks and benefits explained, specific  risks discussed. Consent was given by the patient.     Clinical Data: No additional findings.  ROS:  All other systems negative, except as noted in the HPI. Review of Systems  Objective: Vital Signs: There were no vitals taken for this visit.  Specialty Comments:  No specialty comments available.  PMFS History: Patient Active Problem List   Diagnosis Date Noted   Pain in right hip 07/03/2020   Low back pain 07/03/2020   Status post laparoscopic fundoplication 67/59/1638   History of peptic ulcer 07/24/2011   Esophageal stricture 06/26/2011   H/O: upper GI bleed 06/26/2011   Edema extremities 06/26/2011   Chest pain 06/26/2011   Anemia due to blood loss, acute 06/26/2011   Syncope and collapse 06/11/2011   Melena 06/11/2011   Anemia 06/11/2011   Azotemia 06/11/2011   Volume depletion 06/11/2011   Leukocytosis 06/11/2011   DYSPHAGIA 08/02/2009   HIATAL HERNIA WITH REFLUX 01/30/2009   VITAMIN B12 DEFICIENCY 01/01/2009   BENIGN PROSTATIC HYPERTROPHY, WITH OBSTRUCTION 12/28/2008   FECAL INCONTINENCE 12/28/2008   GERD 12/27/2008   DIVERTICULOSIS, COLON 12/27/2008   RENAL CALCULUS 12/27/2008   Past Medical History:  Diagnosis Date   Anemia    Arthritis    Blood transfusion    BPH (benign prostatic hyperplasia)    External hemorrhoids    GERD (gastroesophageal reflux disease)    nisseum fundoplication 4665   GI bleed 12/12   got 3 units blood   Hiatal hernia    History of TMJ syndrome    mild   History of urinary frequency    Kidney stones    History of   Osteoporosis    Urinary urgency     Family History  Problem Relation Age of Onset   Heart defect Father        aortic valve   Nephrolithiasis Mother    Lung cancer Brother        smoker   Colon cancer Neg Hx    Esophageal cancer Neg Hx    Stomach cancer Neg Hx    Rectal cancer Neg Hx     Past Surgical History:  Procedure Laterality Date   ABDOMINAL SURGERY  1985   tummy tuc   CATARACT  EXTRACTION Bilateral    COLONOSCOPY     ESOPHAGOGASTRODUODENOSCOPY  06/12/2011   Procedure: ESOPHAGOGASTRODUODENOSCOPY (EGD);  Surgeon: Scarlette Shorts, MD;  Location: [facility];  Service: Endoscopy;  Laterality: N/A;   HARDWARE REMOVAL  02/23/2012   Procedure: HARDWARE REMOVAL;  Surgeon: Cammie Sickle., MD;  Location: [facility];  Service: Orthopedics;  Laterality: Left;  biceps tenolysis and screw removal   KNEE ARTHROSCOPY  Left 7/13 3735   NISSEN FUNDOPLICATION  7897   ORIF HUMERUS FRACTURE  12/24/2011   Procedure: OPEN REDUCTION INTERNAL FIXATION (ORIF) PROXIMAL HUMERUS FRACTURE;  Surgeon: Cammie Sickle., MD;  Location: [facility];  Service: Orthopedics;  Laterality: Left;  ORIF left humerus   SHOULDER ARTHROSCOPY Right 1954   SHOULDER ARTHROSCOPY Left 05/28/2011   x 2   spine fussion     Social History   Occupational History   Occupation: retired  Tobacco Use   Smoking status: Former    Packs/day: 1.00    Years: 12.00    Pack years: 12.00    Types: Cigarettes    Quit date: 05/26/1966    Years since quitting: 55.2   Smokeless tobacco: Never  Vaping Use   Vaping Use: Never used  Substance and Sexual Activity   Alcohol use: Yes    Alcohol/week: 1.0 standard drink    Types: 1 Shots of liquor per week    Comment: red wine with dinner   Drug use: No   Sexual activity: Never

## 2021-08-20 ENCOUNTER — Telehealth: Payer: Self-pay | Admitting: Orthopedic Surgery

## 2021-08-20 NOTE — Telephone Encounter (Signed)
Mr. Bunyard called in to leave a message for [facility].  He states that he received a cortisone injection in his shoulder Monday and would like her to know that his arm is "killing" him today.  He also noticed that he is having trouble lifting his arm.  He can be reached at [phone].

## 2021-08-21 ENCOUNTER — Other Ambulatory Visit: Payer: Self-pay

## 2021-08-21 ENCOUNTER — Ambulatory Visit: Payer: Medicare PPO | Admitting: Orthopaedic Surgery

## 2021-08-21 ENCOUNTER — Encounter: Payer: Self-pay | Admitting: Orthopaedic Surgery

## 2021-08-21 DIAGNOSIS — M25511 Pain in right shoulder: Secondary | ICD-10-CM | POA: Diagnosis not present

## 2021-08-21 MED ORDER — METHYLPREDNISOLONE 4 MG PO TBPK: ORAL_TABLET | ORAL | 0 refills | Status: DC

## 2021-08-21 NOTE — Progress Notes (Signed)
Office Visit Note   Patient: Lucas Shepherd           Date of Birth: 26-Sep-1928           MRN: [id number] Visit Date: 08/21/2021              Requested by: London Pepper, MD [place] [address],  [place] [address] PCP: London Pepper, MD   Assessment & Plan: Visit Diagnoses:  1. Acute pain of right shoulder     Plan: [name] was seen by Bevely Palmer Monday for acute onset of right shoulder pain.  She injected the subacromial space .he notes he has really not had much relief.  He actually seems to have  a little bit more motion but still uncomfortable.  He had multiple areas of tenderness along the vertebral border of the the right scapula and a little bit of discomfort with internal/external rotation.  It could be that the cortisone has not had time to to work but I will supplement this with a Medrol Dosepak and then monitor his response over the next week or 2.  Follow-Up Instructions: Return in about 2 weeks (around 09/04/2021).   Orders:  No orders of the defined types were placed in this encounter.  Meds ordered this encounter  Medications   methylPREDNISolone (MEDROL DOSEPAK) 4 MG TBPK tablet    Sig: Take as directed on package.    Dispense:  21 tablet    Refill:  0      Procedures: No procedures performed   Clinical Data: No additional findings.   Subjective: Chief Complaint  Patient presents with   Right Shoulder - Follow-up, Pain  Patient presents today for follow up on his right shoulder. He was here three days ago and saw [facility]. He received a cortisone injection. He called today and states that his pain is no better. He had limited range of motion. He takes Tramadol as needed.   HPI  Review of Systems   Objective: Vital Signs: There were no vitals taken for this visit.  Physical Exam Constitutional:      Appearance: He is well-developed.  Pulmonary:     Effort: Pulmonary effort is normal.  Skin:    General: Skin is  warm and dry.  Neurological:     Mental Status: He is alert and oriented to person, place, and time.  Psychiatric:        Behavior: Behavior normal.    Ortho Exam right shoulder with several areas of trigger point tenderness along the vertebral border of the right scapula.  Was some discomfort with internal/external rotation with the shoulder in the impingement position but no localized areas of tenderness about the subacromial region either anteriorly or laterally.  No AC joint pain.  No loss of motion.  He can actually raise his arm over his head but it was a circuitous arc of motion.  No crepitation.  Biceps appears to be intact.  Good grip and release  Specialty Comments:  No specialty comments available.  Imaging: No results found.   PMFS History: Patient Active Problem List   Diagnosis Date Noted   Pain in right shoulder 08/21/2021   Pain in right hip 07/03/2020   Low back pain 07/03/2020   Status post laparoscopic fundoplication 17/00/1749   History of peptic ulcer 07/24/2011   Esophageal stricture 06/26/2011   H/O: upper GI bleed 06/26/2011   Edema extremities 06/26/2011   Chest pain 06/26/2011   Anemia due  to blood loss, acute 06/26/2011   Syncope and collapse 06/11/2011   Melena 06/11/2011   Anemia 06/11/2011   Azotemia 06/11/2011   Volume depletion 06/11/2011   Leukocytosis 06/11/2011   DYSPHAGIA 08/02/2009   HIATAL HERNIA WITH REFLUX 01/30/2009   VITAMIN B12 DEFICIENCY 01/01/2009   BENIGN PROSTATIC HYPERTROPHY, WITH OBSTRUCTION 12/28/2008   FECAL INCONTINENCE 12/28/2008   GERD 12/27/2008   DIVERTICULOSIS, COLON 12/27/2008   RENAL CALCULUS 12/27/2008   Past Medical History:  Diagnosis Date   Anemia    Arthritis    Blood transfusion    BPH (benign prostatic hyperplasia)    External hemorrhoids    GERD (gastroesophageal reflux disease)    nisseum fundoplication 1660   GI bleed 12/12   got 3 units blood   Hiatal hernia    History of TMJ syndrome     mild   History of urinary frequency    Kidney stones    History of   Osteoporosis    Urinary urgency     Family History  Problem Relation Age of Onset   Heart defect Father        aortic valve   Nephrolithiasis Mother    Lung cancer Brother        smoker   Colon cancer Neg Hx    Esophageal cancer Neg Hx    Stomach cancer Neg Hx    Rectal cancer Neg Hx     Past Surgical History:  Procedure Laterality Date   ABDOMINAL SURGERY  1985   tummy tuc   CATARACT EXTRACTION Bilateral    COLONOSCOPY     ESOPHAGOGASTRODUODENOSCOPY  06/12/2011   Procedure: ESOPHAGOGASTRODUODENOSCOPY (EGD);  Surgeon: Scarlette Shorts, MD;  Location: [facility];  Service: Endoscopy;  Laterality: N/A;   HARDWARE REMOVAL  02/23/2012   Procedure: HARDWARE REMOVAL;  Surgeon: Cammie Sickle., MD;  Location: [facility];  Service: Orthopedics;  Laterality: Left;  biceps tenolysis and screw removal   KNEE ARTHROSCOPY Left 7/13 6301   NISSEN FUNDOPLICATION  6010   ORIF HUMERUS FRACTURE  12/24/2011   Procedure: OPEN REDUCTION INTERNAL FIXATION (ORIF) PROXIMAL HUMERUS FRACTURE;  Surgeon: Cammie Sickle., MD;  Location: [facility];  Service: Orthopedics;  Laterality: Left;  ORIF left humerus   SHOULDER ARTHROSCOPY Right 1954   SHOULDER ARTHROSCOPY Left 05/28/2011   x 2   spine fussion     Social History   Occupational History   Occupation: retired  Tobacco Use   Smoking status: Former    Packs/day: 1.00    Years: 12.00    Pack years: 12.00    Types: Cigarettes    Quit date: 05/26/1966    Years since quitting: 55.2   Smokeless tobacco: Never  Vaping Use   Vaping Use: Never used  Substance and Sexual Activity   Alcohol use: Yes    Alcohol/week: 1.0 standard drink    Types: 1 Shots of liquor per week    Comment: red wine with dinner   Drug use: No   Sexual activity: Never

## 2021-08-21 NOTE — Telephone Encounter (Signed)
I spoke with the patient and he is still having significant arm pain and now has a trigger point in his scapula.  He is not sure the injection helped very much.  His wife is coming home from the rehab tomorrow after being in there for hip replacement surgery.  Can we please add him onto Dr. Rudene Anda schedule this afternoon at 4:00.  Thank you

## 2021-08-27 DIAGNOSIS — Z0000 Encounter for general adult medical examination without abnormal findings: Secondary | ICD-10-CM | POA: Diagnosis not present

## 2021-08-27 DIAGNOSIS — E7800 Pure hypercholesterolemia, unspecified: Secondary | ICD-10-CM | POA: Diagnosis not present

## 2021-08-27 DIAGNOSIS — M810 Age-related osteoporosis without current pathological fracture: Secondary | ICD-10-CM | POA: Diagnosis not present

## 2021-08-27 DIAGNOSIS — E538 Deficiency of other specified B group vitamins: Secondary | ICD-10-CM | POA: Diagnosis not present

## 2021-08-31 ENCOUNTER — Other Ambulatory Visit: Payer: Self-pay | Admitting: Internal Medicine

## 2021-09-04 ENCOUNTER — Encounter: Payer: Self-pay | Admitting: Orthopaedic Surgery

## 2021-09-04 ENCOUNTER — Ambulatory Visit: Payer: Medicare PPO | Admitting: Orthopaedic Surgery

## 2021-09-04 ENCOUNTER — Other Ambulatory Visit: Payer: Self-pay

## 2021-09-04 DIAGNOSIS — M25511 Pain in right shoulder: Secondary | ICD-10-CM | POA: Diagnosis not present

## 2021-09-04 NOTE — Progress Notes (Signed)
Office Visit Note   Patient: Lucas Shepherd           Date of Birth: 11/19/1928           MRN: [id number] Visit Date: 09/04/2021              Requested by: London Pepper, MD [place] [address] [place],  [place] [address] PCP: London Pepper, MD   Assessment & Plan: Visit Diagnoses:  1. Acute pain of right shoulder     Plan: Lucas Shepherd relates that the subacromial cortisone injection in the right shoulder did not help nor did the Medrol Dosepak.  He does take an occasional oxycodone left over from his old knee surgery at night to sleep and still having some difficulty raising his arm over his head.  He does have considerable weakness with external rotation I would not be surprised if he has a good-sized rotator cuff tear.  Order an MRI scan and have him return after the study  Follow-Up Instructions: Return After MRI scan right shoulder.   Orders:  No orders of the defined types were placed in this encounter.  No orders of the defined types were placed in this encounter.     Procedures: No procedures performed   Clinical Data: No additional findings.   Subjective: Chief Complaint  Patient presents with   Right Shoulder - Follow-up  Patient presents today for a two week follow up on his right shoulder. He finished the medrol dose pak. He said that it may have helped some while taking it, but has reverted back to how it was before. He has some oxycodone from a prior surgery that he takes at night.  No related numbness or tingling.  Pain seems to be localized to the anterior and posterior aspect of the right shoulder  HPI  Review of Systems   Objective: Vital Signs: There were no vitals taken for this visit.  Physical Exam Constitutional:      Appearance: He is well-developed.  Pulmonary:     Effort: Pulmonary effort is normal.  Skin:    General: Skin is warm and dry.  Neurological:     Mental Status: He is alert and oriented to person,  place, and time.  Psychiatric:        Behavior: Behavior normal.    Ortho Exam awake alert and oriented.  No acute distress.  He is able to raise his right arm over his head but with a circuitous arc of motion.  Some pain along the anterior and lateral subacromial region.  Biceps appears to be intact.  Considerable weakness with external rotation but not with internal rotation.  Positive empty can testing.  Negative Speed sign.  Positive impingement on the extremes of internal and external rotation.  Good grip and release  Specialty Comments:  No specialty comments available.  Imaging: No results found.   PMFS History: Patient Active Problem List   Diagnosis Date Noted   Pain in right shoulder 08/21/2021   Pain in right hip 07/03/2020   Low back pain 07/03/2020   Status post laparoscopic fundoplication 32/95/1884   History of peptic ulcer 07/24/2011   Esophageal stricture 06/26/2011   H/O: upper GI bleed 06/26/2011   Edema extremities 06/26/2011   Chest pain 06/26/2011   Anemia due to blood loss, acute 06/26/2011   Syncope and collapse 06/11/2011   Melena 06/11/2011   Anemia 06/11/2011   Azotemia 06/11/2011   Volume depletion 06/11/2011   Leukocytosis  06/11/2011   DYSPHAGIA 08/02/2009   HIATAL HERNIA WITH REFLUX 01/30/2009   VITAMIN B12 DEFICIENCY 01/01/2009   BENIGN PROSTATIC HYPERTROPHY, WITH OBSTRUCTION 12/28/2008   FECAL INCONTINENCE 12/28/2008   GERD 12/27/2008   DIVERTICULOSIS, COLON 12/27/2008   RENAL CALCULUS 12/27/2008   Past Medical History:  Diagnosis Date   Anemia    Arthritis    Blood transfusion    BPH (benign prostatic hyperplasia)    External hemorrhoids    GERD (gastroesophageal reflux disease)    nisseum fundoplication 5852   GI bleed 12/12   got 3 units blood   Hiatal hernia    History of TMJ syndrome    mild   History of urinary frequency    Kidney stones    History of   Osteoporosis    Urinary urgency     Family History  Problem  Relation Age of Onset   Heart defect Father        aortic valve   Nephrolithiasis Mother    Lung cancer Brother        smoker   Colon cancer Neg Hx    Esophageal cancer Neg Hx    Stomach cancer Neg Hx    Rectal cancer Neg Hx     Past Surgical History:  Procedure Laterality Date   ABDOMINAL SURGERY  1985   tummy tuc   CATARACT EXTRACTION Bilateral    COLONOSCOPY     ESOPHAGOGASTRODUODENOSCOPY  06/12/2011   Procedure: ESOPHAGOGASTRODUODENOSCOPY (EGD);  Surgeon: Scarlette Shorts, MD;  Location: [facility];  Service: Endoscopy;  Laterality: N/A;   HARDWARE REMOVAL  02/23/2012   Procedure: HARDWARE REMOVAL;  Surgeon: Cammie Sickle., MD;  Location: [facility];  Service: Orthopedics;  Laterality: Left;  biceps tenolysis and screw removal   KNEE ARTHROSCOPY Left 7/13 7782   NISSEN FUNDOPLICATION  4235   ORIF HUMERUS FRACTURE  12/24/2011   Procedure: OPEN REDUCTION INTERNAL FIXATION (ORIF) PROXIMAL HUMERUS FRACTURE;  Surgeon: Cammie Sickle., MD;  Location: [facility];  Service: Orthopedics;  Laterality: Left;  ORIF left humerus   SHOULDER ARTHROSCOPY Right 1954   SHOULDER ARTHROSCOPY Left 05/28/2011   x 2   spine fussion     Social History   Occupational History   Occupation: retired  Tobacco Use   Smoking status: Former    Packs/day: 1.00    Years: 12.00    Pack years: 12.00    Types: Cigarettes    Quit date: 05/26/1966    Years since quitting: 55.3   Smokeless tobacco: Never  Vaping Use   Vaping Use: Never used  Substance and Sexual Activity   Alcohol use: Yes    Alcohol/week: 1.0 standard drink    Types: 1 Shots of liquor per week    Comment: red wine with dinner   Drug use: No   Sexual activity: Never

## 2021-09-04 NOTE — Addendum Note (Signed)
Addended by: Lendon Collar on: 09/04/2021 02:18 PM   Modules accepted: Orders

## 2021-09-20 ENCOUNTER — Other Ambulatory Visit: Payer: Self-pay

## 2021-09-20 ENCOUNTER — Ambulatory Visit
Admission: RE | Admit: 2021-09-20 | Discharge: 2021-09-20 | Disposition: A | Discharge status: Home / Self Care | Payer: Medicare PPO | Source: Ambulatory Visit | Attending: Orthopaedic Surgery | Admitting: Orthopaedic Surgery

## 2021-09-20 DIAGNOSIS — M25511 Pain in right shoulder: Secondary | ICD-10-CM | POA: Diagnosis not present

## 2021-09-20 IMAGING — MR MR SHOULDER*R* W/O CM
7 of 8 series · 31 of 40 positions shown · non-contrast
Comparison: Radiographs [DATE]

CLINICAL DATA: Chronic right shoulder pain.

EXAM:
MRI OF THE RIGHT SHOULDER WITHOUT CONTRAST
TECHNIQUE: Multiplanar, multisequence MR imaging of the shoulder was performed.
No intravenous contrast was administered.

[Series 3: T2 fat-sat · axial · 4.0mm · 0.27mm/px · z∈[-74,+71]mm · 8 of 30 slices shown (1 of 5)]
[im 1/30]
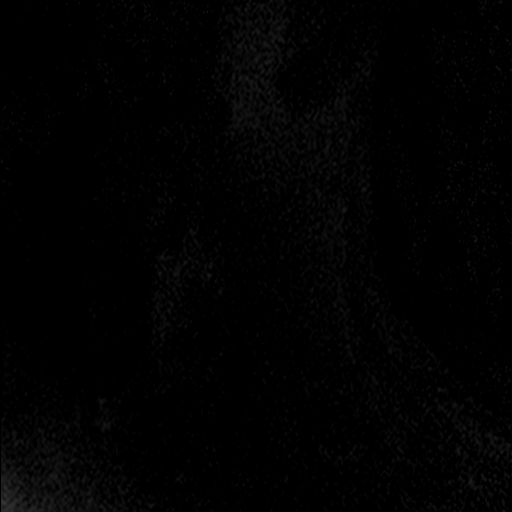
[im 5/30]
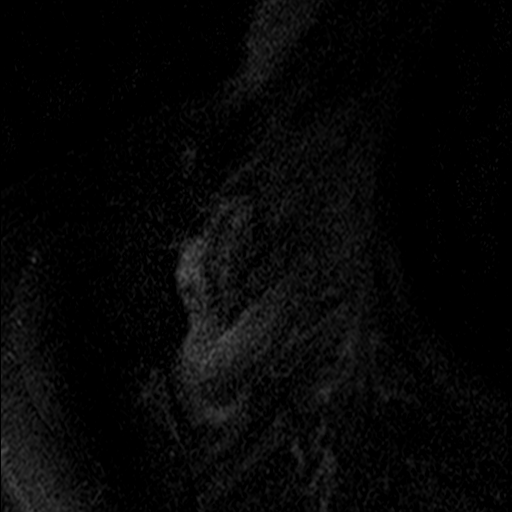
[im 9/30]
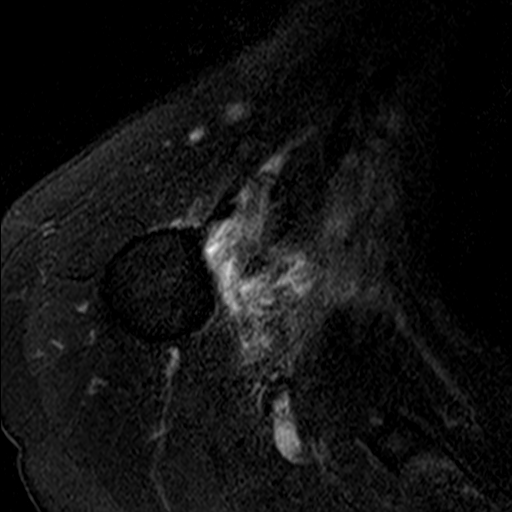
[im 13/30]
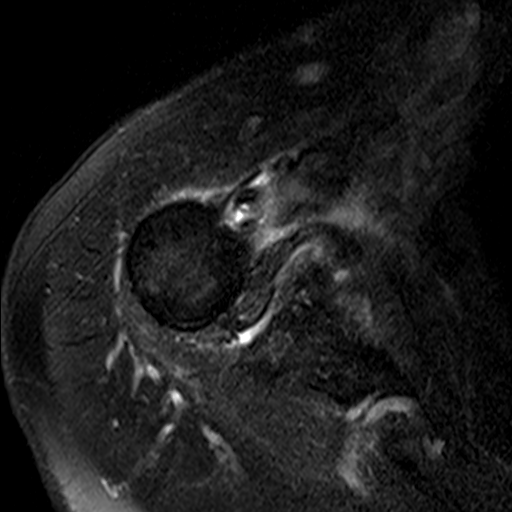
[im 17/30]
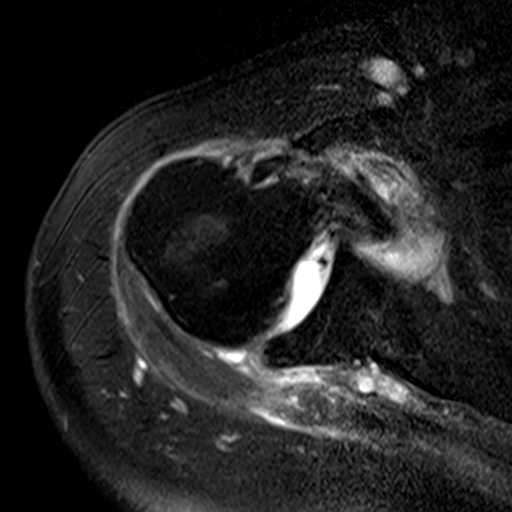
[im 21/30]
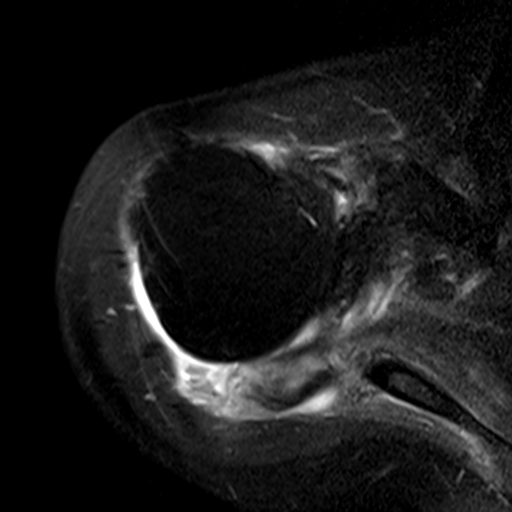
[im 25/30]
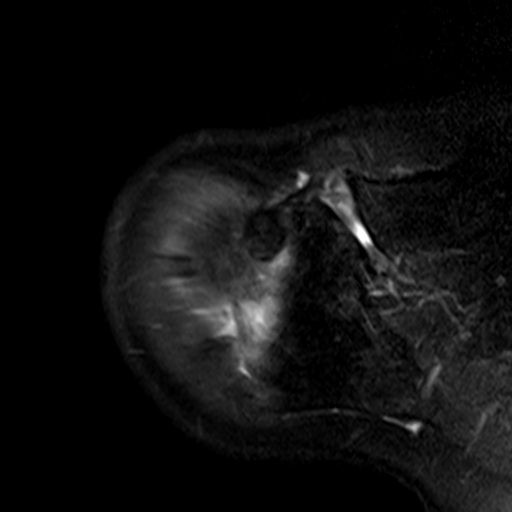
[im 30/30]
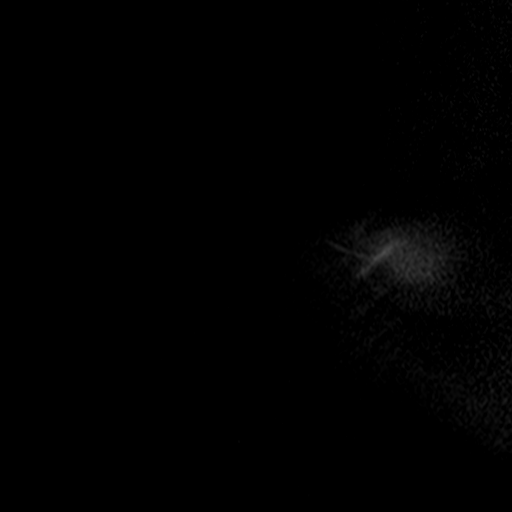

[Series 4: T2 fat-sat · oblique · 4.0mm · 0.55mm/px · 4 of 18 slices shown (2 of 5)]
[im 1/18]
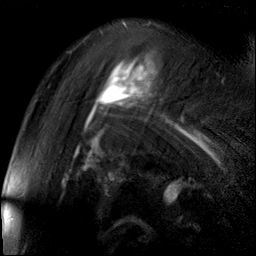
[im 6/18]
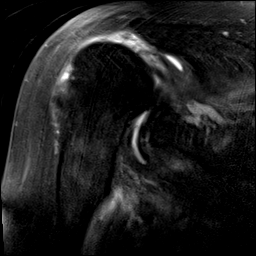
[im 12/18]
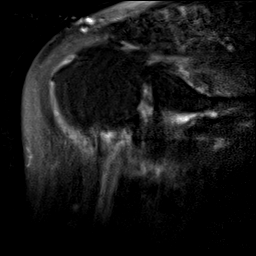
[im 18/18]
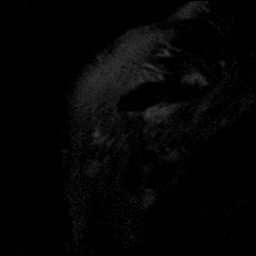

[Series 5: T2 fat-sat · oblique · 4.0mm · 0.55mm/px · 4 of 18 slices shown (3 of 5)]
[im 1/18]
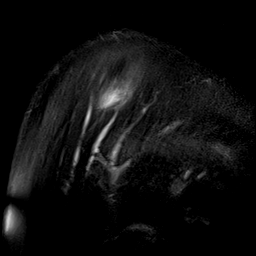
[im 6/18]
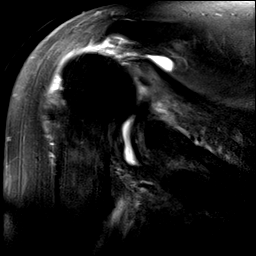
[im 12/18]
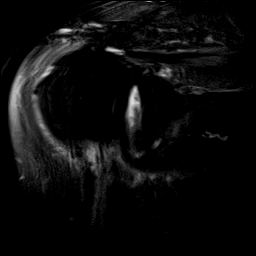
[im 18/18]
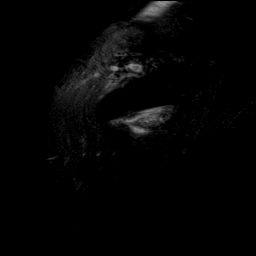

[Series 6: T1 · oblique · 4.0mm · 0.27mm/px · 1 of 19 slices shown]
[im 1/19]
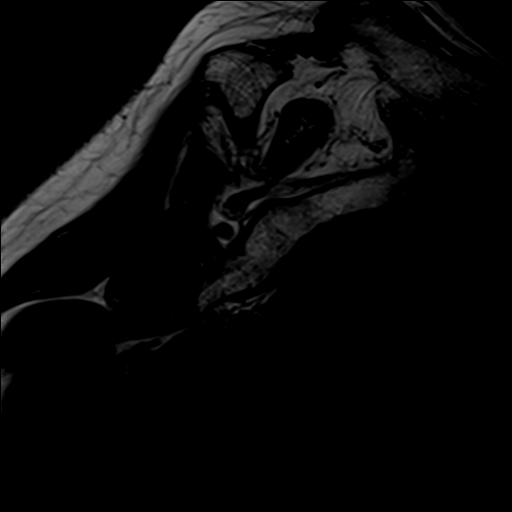

[Series 7: PD · oblique · 4.0mm · 0.27mm/px · 4 of 18 slices shown]
[im 1/18]
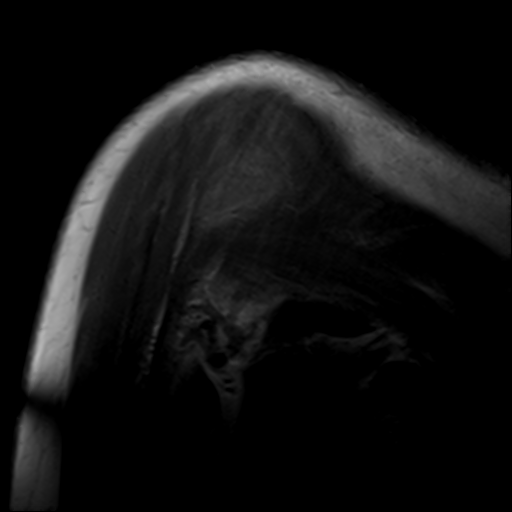
[im 6/18]
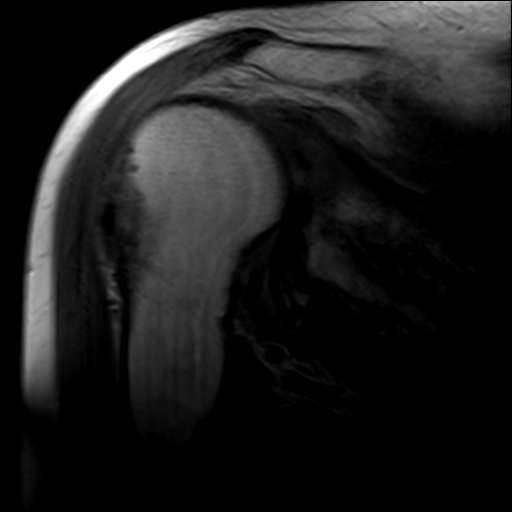
[im 12/18]
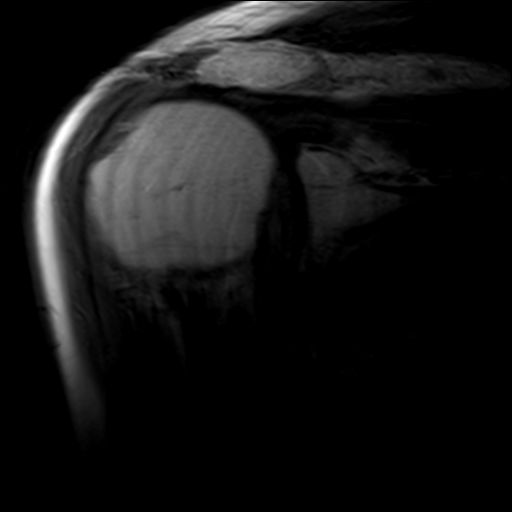
[im 18/18]
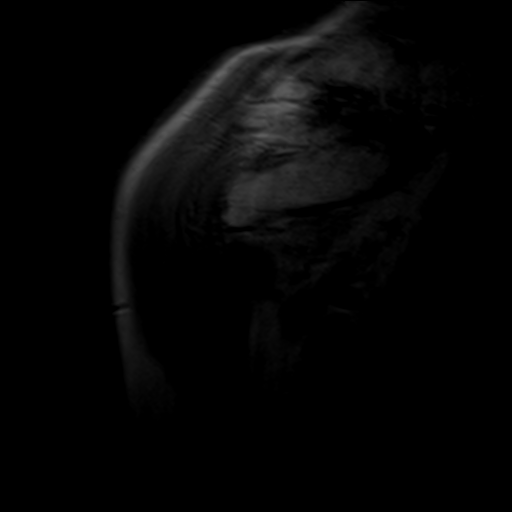

[Series 9: T2 fat-sat · oblique · 4.0mm · 0.55mm/px · 5 of 19 slices shown (4 of 5)]
[im 1/19]
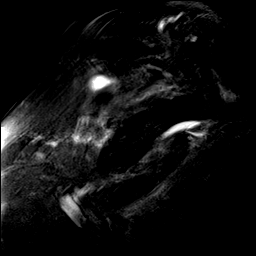
[im 5/19]
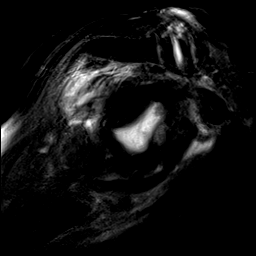
[im 10/19]
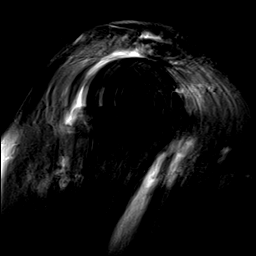
[im 14/19]
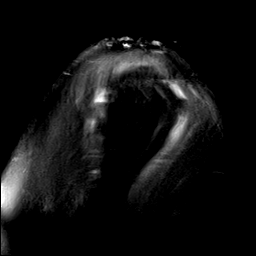
[im 19/19]
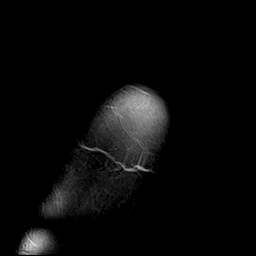

[Series 10: T2 fat-sat · oblique · 4.0mm · 0.55mm/px · 5 of 19 slices shown (5 of 5)]
[im 1/19]
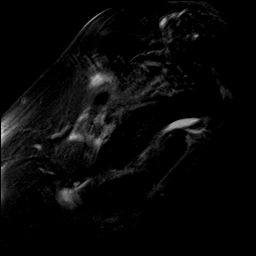
[im 5/19]
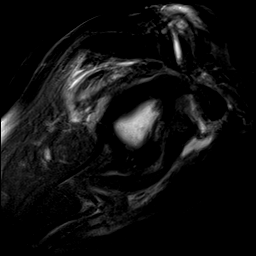
[im 10/19]
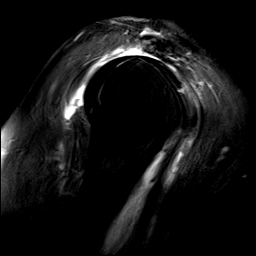
[im 14/19]
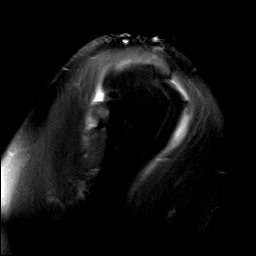
[im 19/19]
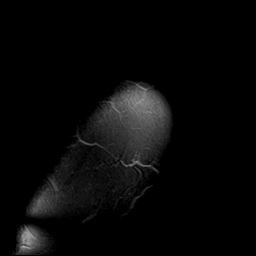

[31 of 40 positions shown; findings below may reference images not displayed]

FINDINGS: Examination is limited by patient motion.

Rotator cuff: Large full-thickness retracted rotator cuff tear. The
supraspinatus and infraspinatus tendons are completely torn and
markedly retracted. The infraspinatus tendon is retracted
approximately 3.5 cm and the supraspinatus tendon 4 cm. The upper
fibers of the subscapularis tendon are torn but the mid and lower
fibers are still intact.

Muscles: Fatty atrophy of the supraspinatus and infraspinatus
muscles.

Biceps long head: Intact. Tendinopathy involving the intra-articular
portion.

Acromioclavicular Joint: Severe degenerative changes with
significant superior and inferior spurring. Type 1-2 acromion. No
lateral downsloping. Mild subacromial spurring.

Glenohumeral Joint: Moderate degenerative changes. The humeral head
is riding high in the glenoid fossa. Moderate-sized joint effusion
and mild synovitis.

Labrum:  Degenerated and likely torn.

Bones:  No acute bony findings.

Other: Expected fluid in the subacromial/subdeltoid bursa.
IMPRESSION: 1. Large full-thickness retracted tears of the supraspinatus and
infraspinatus tendons. Associated fatty atrophy of the muscles.
2. The upper fibers of the subscapularis tendon are torn but the mid
and lower fibers are still intact.
3. Intact long head biceps tendon. Tendinopathy involving the
intra-articular portion.
4. Severe AC joint degenerative changes and mild subacromial
spurring.
5. Moderate glenohumeral joint degenerative changes.

## 2021-09-23 ENCOUNTER — Ambulatory Visit: Payer: Medicare PPO | Admitting: Orthopaedic Surgery

## 2021-09-24 ENCOUNTER — Other Ambulatory Visit: Payer: Self-pay

## 2021-09-24 ENCOUNTER — Encounter: Payer: Self-pay | Admitting: Orthopaedic Surgery

## 2021-09-24 ENCOUNTER — Ambulatory Visit: Payer: Medicare PPO | Admitting: Orthopaedic Surgery

## 2021-09-24 DIAGNOSIS — G8929 Other chronic pain: Secondary | ICD-10-CM | POA: Diagnosis not present

## 2021-09-24 DIAGNOSIS — M25511 Pain in right shoulder: Secondary | ICD-10-CM | POA: Diagnosis not present

## 2021-09-24 NOTE — Progress Notes (Signed)
? ?Office Visit Note ?  ?Patient: Lucas Shepherd           ?Date of Birth: [birth year]           ?MRN: [id number] ?Visit Date: 09/24/2021 ?             ?Requested by: London Pepper, MD ?[address] ?[address] ?[place],  [place] [address] ?PCP: London Pepper, MD ? ? ?Assessment & Plan: ?Visit Diagnoses:  ?1. Chronic right shoulder pain   ? ? ?Plan: Lucas Shepherd had an MRI scan of his right shoulder demonstrating significant tearing of both the infra and supraspinatus tendons with 3-1/2 to 4 cm of retraction and significant atrophy.  There were moderate degenerative changes at the glenohumeral joint and tendinopathy of the biceps tendon.  There are significant degenerative changes at the [facility] joint with inferior spurring predisposing to impingement with a type I-II acromion.  It appears that he is experiencing rotator cuff arthropathy with significant compromise of his activities.  I believe he be a good candidate for a reverse shoulder arthroplasty.  I have given him several options but he would like to check with the orthopedist at [facility] where he has been in the past for spine surgery.  We will provide a disc of his x-rays and have him pick up the MRI scan disc at the radiology office.  If he prefers to have surgery in [place] I will have him see Dr. Marlou Sa.  All questions were answered ? ?Follow-Up Instructions: Return if symptoms worsen or fail to improve.  ? ?Orders:  ?No orders of the defined types were placed in this encounter. ? ?No orders of the defined types were placed in this encounter. ? ? ? ? Procedures: ?No procedures performed ? ? ?Clinical Data: ?No additional findings. ? ? ?Subjective: ?Chief Complaint  ?Patient presents with  ? Right Shoulder - Follow-up  ?  MRI review  ?Patient presents today for follow up on his right shoulder. He had an MRI and is here today for those results.  No change in symptoms.  Having trouble sleeping on his right shoulder and lifting his arm over his head.   He does have significant compromise of his activities ? ?HPI ? ?Review of Systems ? ? ?Objective: ?Vital Signs: There were no vitals taken for this visit. ? ?Physical Exam ?Constitutional:   ?   Appearance: He is well-developed.  ?Pulmonary:  ?   Effort: Pulmonary effort is normal.  ?Skin: ?   General: Skin is warm and dry.  ?Neurological:  ?   Mental Status: He is alert and oriented to person, place, and time.  ?Psychiatric:     ?   Behavior: Behavior normal.  ? ? ?Ortho Exam awake alert and oriented x3.  Comfortable sitting.  Exam is limited to the right shoulder.  Able to place it fully overhead but at about 100 degrees of flexion and needed to use his left arm to raise the right shoulder fully overhead.  Considerable weakness with external rotation.  Good strength with internal rotation.  No crepitation.  No evidence of adhesive capsulitis or instability.  Positive empty can testing.  Positive Speed sign.  Good grip and release ? ?Specialty Comments:  ?No specialty comments available. ? ?Imaging: ?No results found. ? ? ?PMFS History: ?Patient Active Problem List  ? Diagnosis Date Noted  ? Pain in right shoulder 08/21/2021  ? Pain in right hip 07/03/2020  ? Low back pain 07/03/2020  ?  Status post laparoscopic fundoplication 51/88/4166  ? History of peptic ulcer 07/24/2011  ? Esophageal stricture 06/26/2011  ? H/O: upper GI bleed 06/26/2011  ? Edema extremities 06/26/2011  ? Chest pain 06/26/2011  ? Anemia due to blood loss, acute 06/26/2011  ? Syncope and collapse 06/11/2011  ? Melena 06/11/2011  ? Anemia 06/11/2011  ? Azotemia 06/11/2011  ? Volume depletion 06/11/2011  ? Leukocytosis 06/11/2011  ? DYSPHAGIA 08/02/2009  ? HIATAL HERNIA WITH REFLUX 01/30/2009  ? VITAMIN B12 DEFICIENCY 01/01/2009  ? BENIGN PROSTATIC HYPERTROPHY, WITH OBSTRUCTION 12/28/2008  ? FECAL INCONTINENCE 12/28/2008  ? GERD 12/27/2008  ? DIVERTICULOSIS, COLON 12/27/2008  ? RENAL CALCULUS 12/27/2008  ? ?Past Medical History:  ?Diagnosis Date   ? Anemia   ? Arthritis   ? Blood transfusion   ? BPH (benign prostatic hyperplasia)   ? External hemorrhoids   ? GERD (gastroesophageal reflux disease)   ? nisseum fundoplication 0630  ? GI bleed 12/12  ? got 3 units blood  ? Hiatal hernia   ? History of TMJ syndrome   ? mild  ? History of urinary frequency   ? Kidney stones   ? History of  ? Osteoporosis   ? Urinary urgency   ?  ?Family History  ?Problem Relation Age of Onset  ? Heart defect Father   ?     aortic valve  ? Nephrolithiasis Mother   ? Lung cancer Brother   ?     smoker  ? Colon cancer Neg Hx   ? Esophageal cancer Neg Hx   ? Stomach cancer Neg Hx   ? Rectal cancer Neg Hx   ?  ?Past Surgical History:  ?Procedure Laterality Date  ? ABDOMINAL SURGERY  1985  ? tummy tuc  ? CATARACT EXTRACTION Bilateral   ? COLONOSCOPY    ? ESOPHAGOGASTRODUODENOSCOPY  06/12/2011  ? Procedure: ESOPHAGOGASTRODUODENOSCOPY (EGD);  Surgeon: Scarlette Shorts, MD;  Location: [facility];  Service: Endoscopy;  Laterality: N/A;  ? HARDWARE REMOVAL  02/23/2012  ? Procedure: HARDWARE REMOVAL;  Surgeon: Cammie Sickle., MD;  Location: [place];  Service: Orthopedics;  Laterality: Left;  biceps tenolysis and screw removal  ? KNEE ARTHROSCOPY Left 7/13 2017  ? NISSEN FUNDOPLICATION  1601  ? ORIF HUMERUS FRACTURE  12/24/2011  ? Procedure: OPEN REDUCTION INTERNAL FIXATION (ORIF) PROXIMAL HUMERUS FRACTURE;  Surgeon: Cammie Sickle., MD;  Location: [place];  Service: Orthopedics;  Laterality: Left;  ORIF left humerus  ? SHOULDER ARTHROSCOPY Right 1954  ? SHOULDER ARTHROSCOPY Left 05/28/2011  ? x 2  ? spine fussion    ? ?Social History  ? ?Occupational History  ? Occupation: retired  ?Tobacco Use  ? Smoking status: Former  ?  Packs/day: 1.00  ?  Years: 12.00  ?  Pack years: 12.00  ?  Types: Cigarettes  ?  Quit date: 05/26/1966  ?  Years since quitting: 55.3  ? Smokeless tobacco: Never  ?Vaping Use  ? Vaping Use: Never used  ?Substance and Sexual Activity   ? Alcohol use: Yes  ?  Alcohol/week: 1.0 standard drink  ?  Types: 1 Shots of liquor per week  ?  Comment: red wine with dinner  ? Drug use: No  ? Sexual activity: Never  ? ? ? ? ? ? ?

## 2021-09-25 DIAGNOSIS — M75100 Unspecified rotator cuff tear or rupture of unspecified shoulder, not specified as traumatic: Secondary | ICD-10-CM | POA: Diagnosis not present

## 2021-10-08 DIAGNOSIS — M75100 Unspecified rotator cuff tear or rupture of unspecified shoulder, not specified as traumatic: Secondary | ICD-10-CM | POA: Diagnosis not present

## 2021-10-08 DIAGNOSIS — M75101 Unspecified rotator cuff tear or rupture of right shoulder, not specified as traumatic: Secondary | ICD-10-CM | POA: Diagnosis not present

## 2021-10-08 DIAGNOSIS — Z01818 Encounter for other preprocedural examination: Secondary | ICD-10-CM | POA: Diagnosis not present

## 2021-10-08 DIAGNOSIS — M19011 Primary osteoarthritis, right shoulder: Secondary | ICD-10-CM | POA: Diagnosis not present

## 2021-10-13 DIAGNOSIS — M810 Age-related osteoporosis without current pathological fracture: Secondary | ICD-10-CM | POA: Diagnosis not present

## 2021-10-24 DIAGNOSIS — Z981 Arthrodesis status: Secondary | ICD-10-CM | POA: Diagnosis not present

## 2021-10-24 DIAGNOSIS — Z8711 Personal history of peptic ulcer disease: Secondary | ICD-10-CM | POA: Diagnosis not present

## 2021-10-24 DIAGNOSIS — Z96611 Presence of right artificial shoulder joint: Secondary | ICD-10-CM | POA: Diagnosis not present

## 2021-10-24 DIAGNOSIS — G629 Polyneuropathy, unspecified: Secondary | ICD-10-CM | POA: Diagnosis not present

## 2021-10-24 DIAGNOSIS — M75121 Complete rotator cuff tear or rupture of right shoulder, not specified as traumatic: Secondary | ICD-10-CM | POA: Diagnosis not present

## 2021-10-24 DIAGNOSIS — M7061 Trochanteric bursitis, right hip: Secondary | ICD-10-CM | POA: Diagnosis not present

## 2021-10-24 DIAGNOSIS — Z886 Allergy status to analgesic agent status: Secondary | ICD-10-CM | POA: Diagnosis not present

## 2021-10-24 DIAGNOSIS — M12811 Other specific arthropathies, not elsewhere classified, right shoulder: Secondary | ICD-10-CM | POA: Diagnosis not present

## 2021-10-24 DIAGNOSIS — M19011 Primary osteoarthritis, right shoulder: Secondary | ICD-10-CM | POA: Diagnosis not present

## 2021-10-24 DIAGNOSIS — G8918 Other acute postprocedural pain: Secondary | ICD-10-CM | POA: Diagnosis not present

## 2021-10-24 DIAGNOSIS — M75100 Unspecified rotator cuff tear or rupture of unspecified shoulder, not specified as traumatic: Secondary | ICD-10-CM | POA: Diagnosis not present

## 2021-10-24 DIAGNOSIS — Z79899 Other long term (current) drug therapy: Secondary | ICD-10-CM | POA: Diagnosis not present

## 2021-10-24 DIAGNOSIS — Z87891 Personal history of nicotine dependence: Secondary | ICD-10-CM | POA: Diagnosis not present

## 2021-10-24 DIAGNOSIS — M25711 Osteophyte, right shoulder: Secondary | ICD-10-CM | POA: Diagnosis not present

## 2021-11-01 DIAGNOSIS — M25511 Pain in right shoulder: Secondary | ICD-10-CM | POA: Diagnosis not present

## 2021-11-05 DIAGNOSIS — M25511 Pain in right shoulder: Secondary | ICD-10-CM | POA: Diagnosis not present

## 2021-11-06 DIAGNOSIS — Z96611 Presence of right artificial shoulder joint: Secondary | ICD-10-CM | POA: Diagnosis not present

## 2021-11-12 DIAGNOSIS — M810 Age-related osteoporosis without current pathological fracture: Secondary | ICD-10-CM | POA: Diagnosis not present

## 2021-11-12 DIAGNOSIS — E559 Vitamin D deficiency, unspecified: Secondary | ICD-10-CM | POA: Diagnosis not present

## 2021-11-12 DIAGNOSIS — M4850XA Collapsed vertebra, not elsewhere classified, site unspecified, initial encounter for fracture: Secondary | ICD-10-CM | POA: Diagnosis not present

## 2021-11-25 ENCOUNTER — Other Ambulatory Visit: Payer: Self-pay | Admitting: Internal Medicine

## 2021-11-28 ENCOUNTER — Emergency Department (HOSPITAL_BASED_OUTPATIENT_CLINIC_OR_DEPARTMENT_OTHER)
Admission: EM | Admit: 2021-11-28 | Discharge: 2021-11-28 | Disposition: A | Discharge status: Home / Self Care | Payer: Medicare PPO | Attending: Emergency Medicine | Admitting: Emergency Medicine

## 2021-11-28 ENCOUNTER — Emergency Department (HOSPITAL_BASED_OUTPATIENT_CLINIC_OR_DEPARTMENT_OTHER): Payer: Medicare PPO

## 2021-11-28 ENCOUNTER — Encounter (HOSPITAL_BASED_OUTPATIENT_CLINIC_OR_DEPARTMENT_OTHER): Payer: Self-pay | Admitting: Emergency Medicine

## 2021-11-28 ENCOUNTER — Other Ambulatory Visit: Payer: Self-pay

## 2021-11-28 ENCOUNTER — Emergency Department (HOSPITAL_BASED_OUTPATIENT_CLINIC_OR_DEPARTMENT_OTHER): Payer: Medicare PPO | Admitting: Radiology

## 2021-11-28 DIAGNOSIS — Z79899 Other long term (current) drug therapy: Secondary | ICD-10-CM | POA: Insufficient documentation

## 2021-11-28 DIAGNOSIS — S43004A Unspecified dislocation of right shoulder joint, initial encounter: Secondary | ICD-10-CM | POA: Diagnosis not present

## 2021-11-28 DIAGNOSIS — Z471 Aftercare following joint replacement surgery: Secondary | ICD-10-CM | POA: Diagnosis not present

## 2021-11-28 DIAGNOSIS — W19XXXA Unspecified fall, initial encounter: Secondary | ICD-10-CM | POA: Diagnosis not present

## 2021-11-28 DIAGNOSIS — Z96611 Presence of right artificial shoulder joint: Secondary | ICD-10-CM | POA: Insufficient documentation

## 2021-11-28 DIAGNOSIS — S4991XA Unspecified injury of right shoulder and upper arm, initial encounter: Secondary | ICD-10-CM | POA: Diagnosis present

## 2021-11-28 DIAGNOSIS — M25511 Pain in right shoulder: Secondary | ICD-10-CM | POA: Diagnosis not present

## 2021-11-28 IMAGING — DX DG SHOULDER 2+V*R*
2 series · 2 of 2 positions shown · non-contrast
Comparison: Right shoulder radiograph dated [DATE].

CLINICAL DATA: Right shoulder pain.

EXAM:
RIGHT SHOULDER - 2+ VIEW

[shoulder ap]
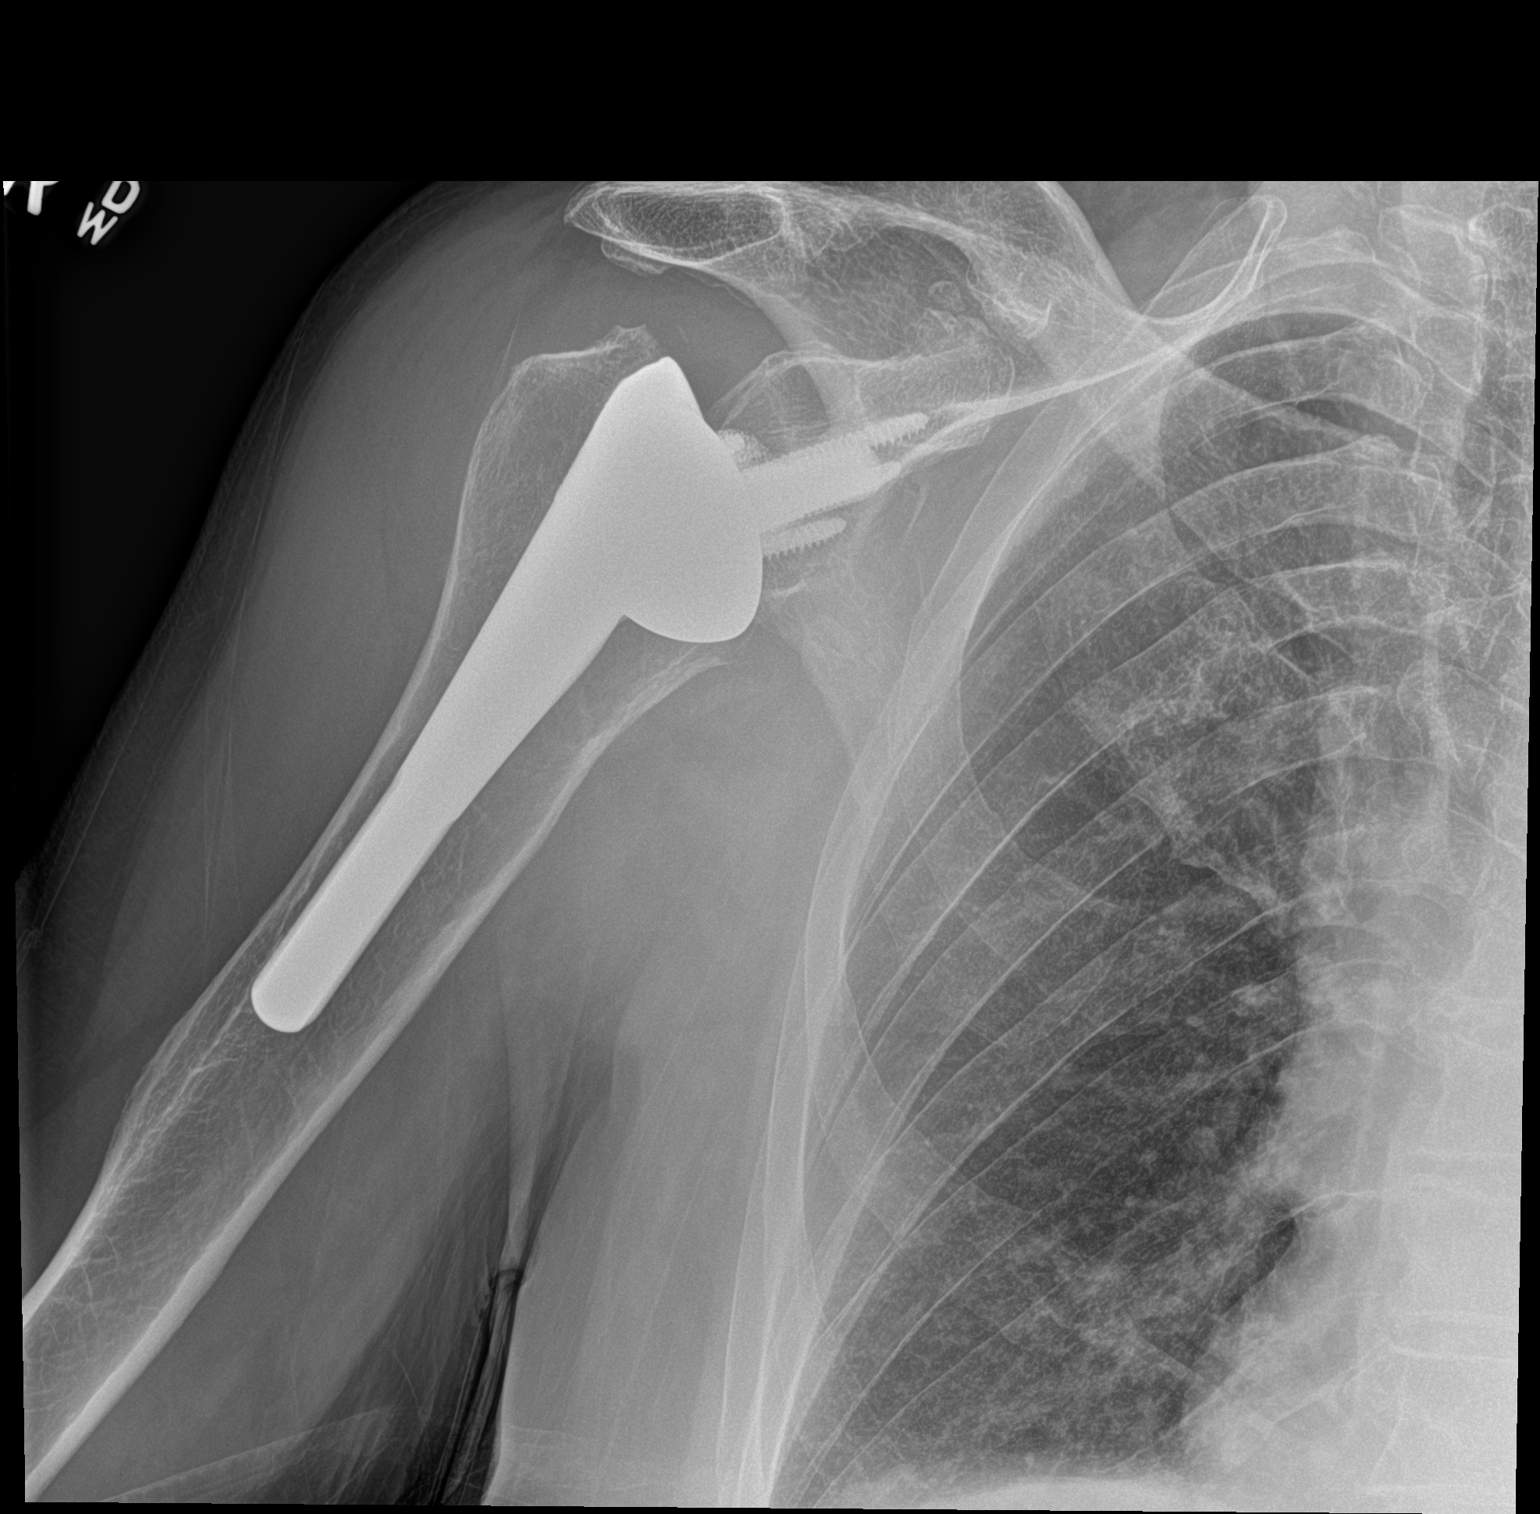

[shoulder obl]
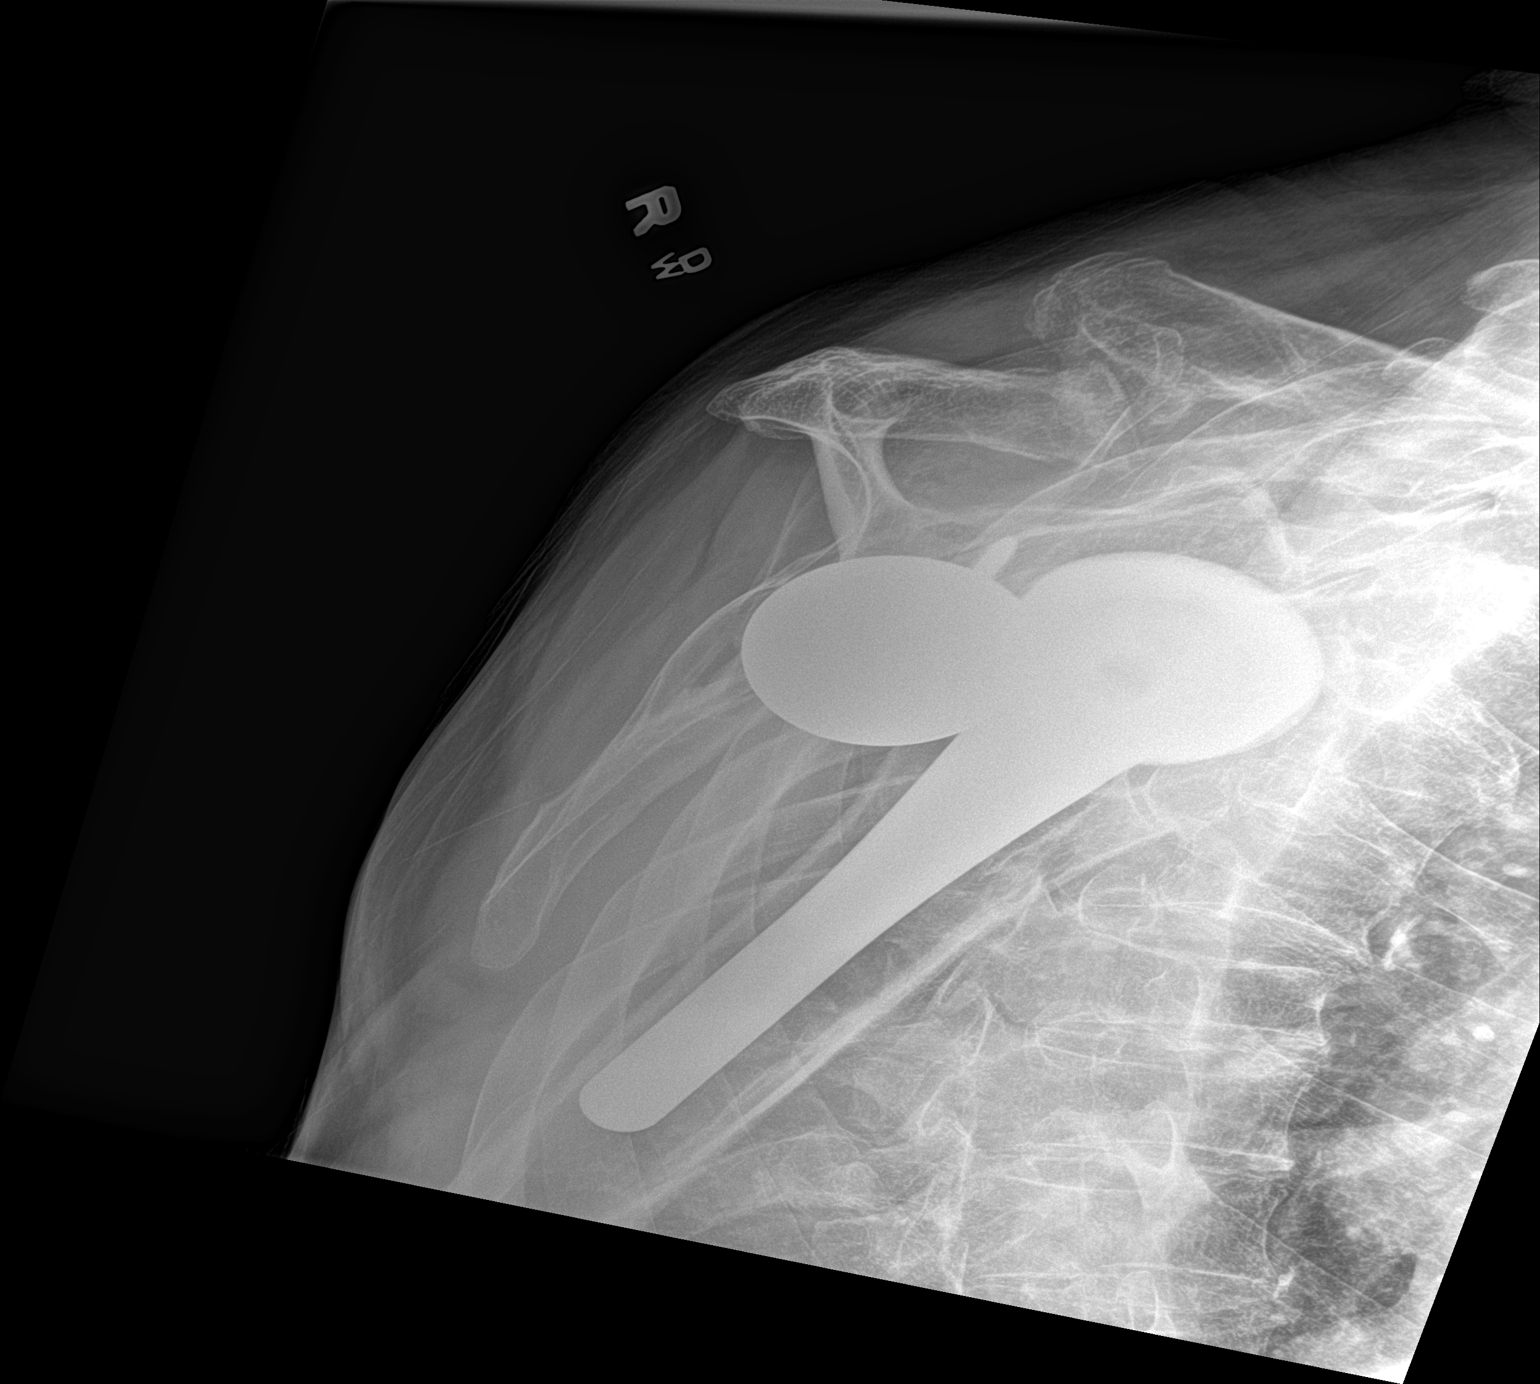

[2 of 2 positions shown; findings below may reference images not displayed]

FINDINGS: Anteriorly dislocated right shoulder arthroplasty. No obvious acute
fracture. The bones are osteopenic. Degenerative changes of the
right AC joint. There is widening of the joint space indicative of
effusion.
IMPRESSION: Anteriorly dislocated right shoulder arthroplasty.

## 2021-11-28 IMAGING — DX DG SHOULDER 1V*R*
1 series · 1 of 1 positions shown · non-contrast
Comparison: [DATE] p.m.

CLINICAL DATA: Shoulder dislocation, post reduction imaging

EXAM:
RIGHT SHOULDER - 1 VIEW

[shoulder ap]
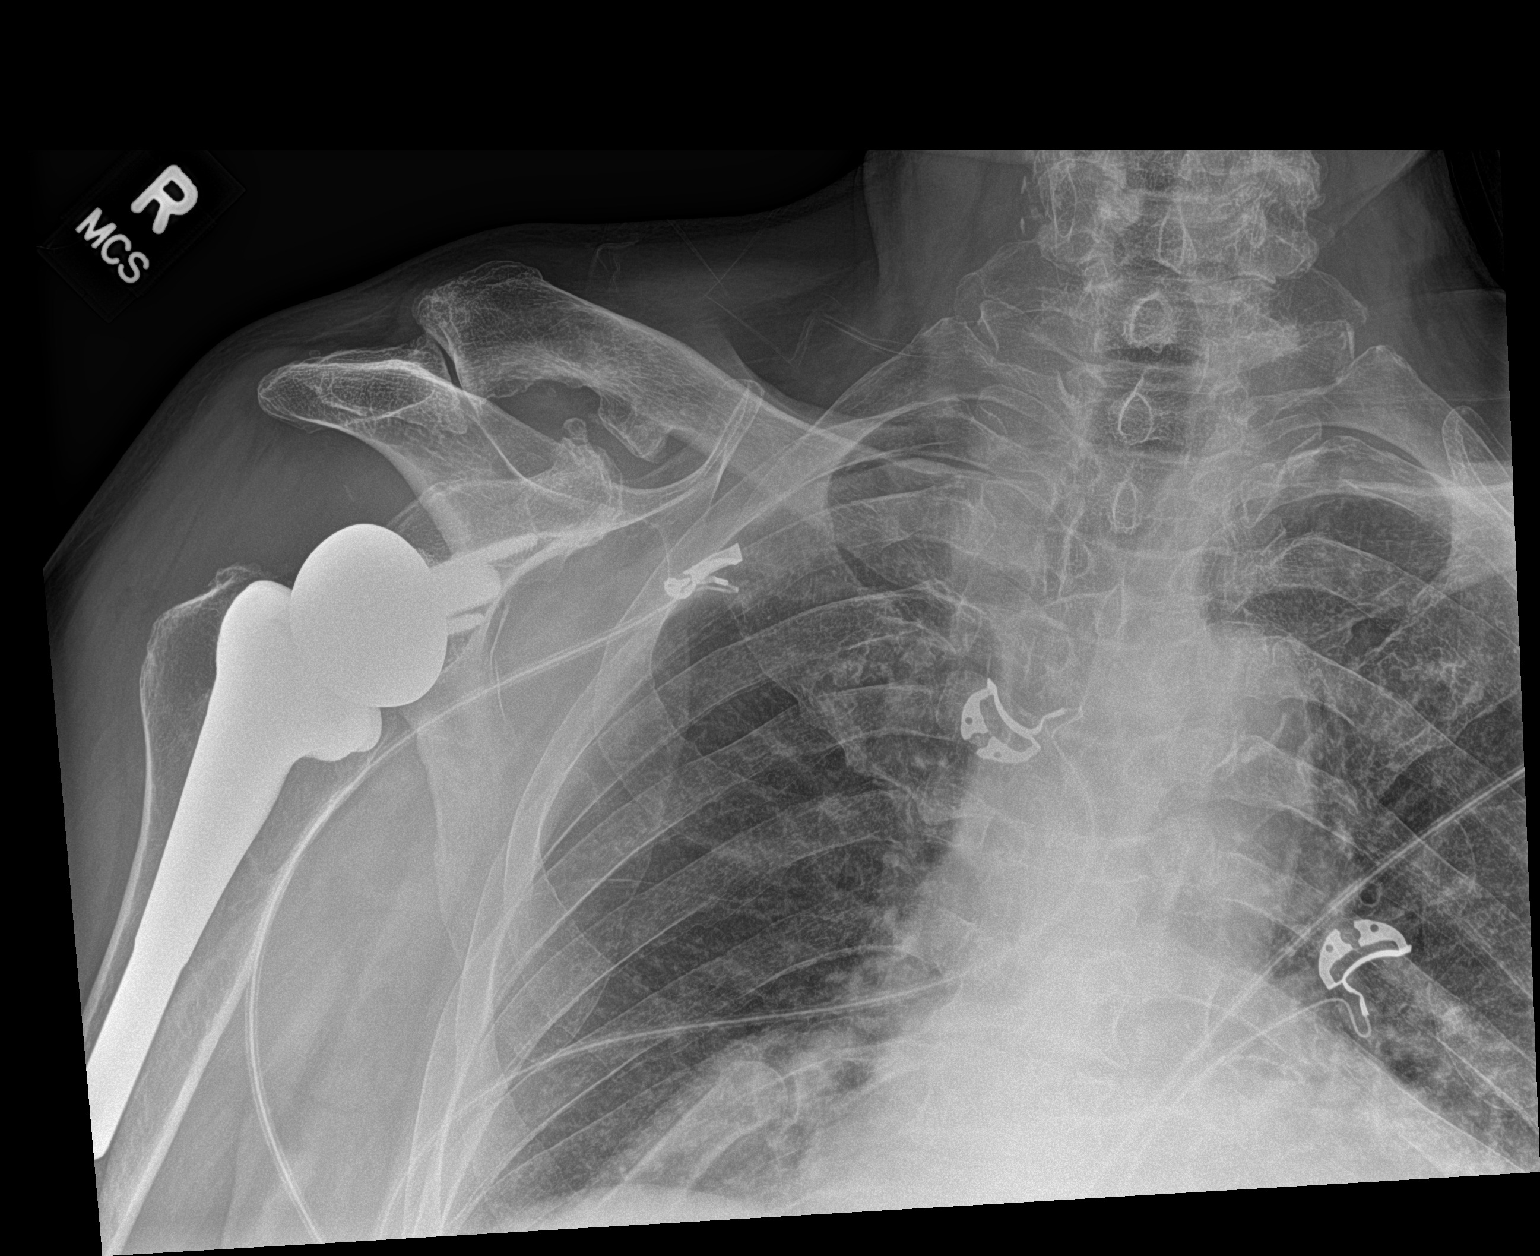

[1 of 1 positions shown; findings below may reference images not displayed]

FINDINGS: Single view radiograph right shoulder now demonstrates right total
shoulder arthroplasty components overlying the expected positions in
relation to each other on this single view examination. Mild
acromioclavicular degenerative arthritis. No acute fracture.
IMPRESSION: Interval apparent relocation of right total shoulder arthroplasty
components on this limited examination. No superimposed acute
fracture.

## 2021-11-28 MED ORDER — LIDOCAINE HCL (PF) 1 % IJ SOLN
INTRAMUSCULAR | Status: AC
  Administered 2021-11-28: 10 mL
  Filled 2021-11-28: qty 5

## 2021-11-28 MED ORDER — LIDOCAINE HCL (PF) 1 % IJ SOLN
30.0000 mL | Freq: Once | INTRAMUSCULAR | Status: AC
  Administered 2021-11-28: 30 mL
  Filled 2021-11-28: qty 30

## 2021-11-28 MED ORDER — PROPOFOL 10 MG/ML IV BOLUS
1.0000 mg/kg | Freq: Once | INTRAVENOUS | Status: AC
  Administered 2021-11-28: 85.7 mg via INTRAVENOUS
  Filled 2021-11-28: qty 20

## 2021-11-28 MED ORDER — OXYCODONE-ACETAMINOPHEN 5-325 MG PO TABS
1.0000 | ORAL_TABLET | ORAL | Status: DC | PRN
  Administered 2021-11-28: 1 via ORAL
  Filled 2021-11-28: qty 1

## 2021-11-28 NOTE — ED Notes (Signed)
Patient transported to X-ray 

## 2021-11-28 NOTE — ED Notes (Signed)
Discharge paperwork given and understood. 

## 2021-11-28 NOTE — ED Triage Notes (Signed)
Pt had shoulder replacement surgery  for his right shoulder. Pt stated today that he flet that his shoulder had popped out of joint and then popped back into joint. Pt took 5 mg of oxycodone at 1800. Pain meicaiton has been ineffective.

## 2021-11-28 NOTE — Discharge Instructions (Addendum)
Please follow up with your orthopedic surgeon tomorrow for your shoulder injury.  Wear the shoulder immobilizer at ALL TIMES until you are cleared by your surgeon.

## 2021-11-28 NOTE — ED Provider Notes (Signed)
[facility] DEPT Provider Note   CSN: [id number] Arrival date & time: 11/28/21  1804     History {Add pertinent medical, surgical, social history, OB history to HPI:1} Chief Complaint  Patient presents with   Shoulder Pain    Lucas Shepherd is a 88 y.o. male here with right shoulder dislocation, popped it out then in and then out again tonight just reading a book and showering.  Hx of right shoulder placement 1 month ago at [facility]. Not on A/C.  HPI     Home Medications Prior to Admission medications   Medication Sig Start Date End Date Taking? Authorizing Provider  B Complex-Biotin-FA (B COMPLETE) TABS Take 0.5 tablets by mouth at bedtime.    [provider]  Calcium Carb-Cholecalciferol (CALCIUM + D3 PO) Take 1 tablet by mouth daily with breakfast.    [provider]  cephALEXin (KEFLEX) 500 MG capsule Take 1 capsule (500 mg total) by mouth 3 (three) times daily. 09/19/20   Varney Biles, MD  Coenzyme Q10 (COQ10) 200 MG CAPS Take 200 mg by mouth every morning.    [provider]  denosumab (PROLIA) 60 MG/ML SOSY injection Inject 60 mg into the skin every 6 (six) months.    [provider]  famotidine (PEPCID) 20 MG tablet Take 1 tablet (20 mg total) by mouth 2 (two) times daily. **PLEASE CONTACT OFFICE TO SCHEDULE FOLLOW UP 09/01/21   Pyrtle, Lajuan Lines, MD  finasteride (PROSCAR) 5 MG tablet Take 5 mg by mouth at bedtime.    [provider]  fluticasone (FLONASE) 50 MCG/ACT nasal spray Place 1 spray into both nostrils at bedtime. 09/06/19   [provider]  glucosamine-chondroitin 500-400 MG tablet Take 1 tablet by mouth 2 (two) times daily.    [provider]  Methylcellulose, Laxative, (CITRUCEL FIBERSHAKE PO) Take 1 Scoop by mouth See admin instructions. Mix 1 scoopful into 8 ounces of water and drink every morning    [provider]  methylPREDNISolone (MEDROL DOSEPAK) 4 MG TBPK tablet  Take as directed on package. 08/21/21   Garald Balding, MD  Methylsulfonylmethane (MSM) 1000 MG CAPS Take 1,000 mg by mouth 2 (two) times daily.    [provider]  Multiple Vitamins-Minerals (PRESERVISION AREDS) CAPS Take 1 capsule by mouth 2 (two) times daily.    [provider]  OVER THE COUNTER MEDICATION Take 1 capsule by mouth See admin instructions. Beta-Sitosterol capsules- Take 1 capsule by mouth in the morning    [provider]  OVER THE COUNTER MEDICATION Take 1 capsule by mouth See admin instructions. Bladderwrack capsules- Take 1 capsule by mouth in the morning    [provider]  polyethylene glycol powder (GLYCOLAX/MIRALAX) 17 GM/SCOOP powder Take 17 g by mouth daily.    [provider]  traMADol (ULTRAM) 50 MG tablet TAKE 1 TABLET BY MOUTH 2 TIMES DAILY AS NEEDED. 08/18/21   Persons, Bevely Palmer, PA  TURMERIC PO Take 1 capsule by mouth daily.     [provider]      Allergies    Aspirin and Nsaids    Review of Systems   Review of Systems  Physical Exam Updated Vital Signs BP (!) 144/80   Pulse (!) 110   Temp 98.5 F (36.9 C) (Oral)   Resp 20   Ht '5\' 9"'$  (1.753 m)   Wt 85.7 kg   SpO2 96%   BMI 27.91 kg/m  Physical Exam  ED Results / Procedures /  Treatments   Labs (all labs ordered are listed, but only abnormal results are displayed) Labs Reviewed - No data to display  EKG None  Radiology DG Shoulder Right  Result Date: 11/28/2021 CLINICAL DATA:  Right shoulder pain. EXAM: RIGHT SHOULDER - 2+ VIEW COMPARISON:  Right shoulder radiograph dated 08/18/2021. FINDINGS: Anteriorly dislocated right shoulder arthroplasty. No obvious acute fracture. The bones are osteopenic. Degenerative changes of the right AC joint. There is widening of the joint space indicative of effusion. IMPRESSION: Anteriorly dislocated right shoulder arthroplasty. Electronically Signed   By: Anner Crete M.D.   On: 11/28/2021 19:17     Procedures Procedures  {Document cardiac monitor, telemetry assessment procedure when appropriate:1}  Medications Ordered in ED Medications  oxyCODONE-acetaminophen (PERCOCET/ROXICET) 5-325 MG per tablet 1 tablet (1 tablet Oral Given 11/28/21 1843)  lidocaine (PF) (XYLOCAINE) 1 % injection 30 mL (has no administration in time range)    ED Course/ Medical Decision Making/ A&P                           Medical Decision Making Amount and/or Complexity of Data Reviewed Radiology: ordered.  Risk Prescription drug management.   ***  {Document critical care time when appropriate:1} {Document review of labs and clinical decision tools ie heart score, Chads2Vasc2 etc:1}  {Document your independent review of radiology images, and any outside records:1} {Document your discussion with family members, caretakers, and with consultants:1} {Document social determinants of health affecting pt's care:1} {Document your decision making why or why not admission, treatments were needed:1} Final Clinical Impression(s) / ED Diagnoses Final diagnoses:  None    Rx / DC Orders ED Discharge Orders     None

## 2021-11-28 NOTE — ED Notes (Signed)
Back from Xray. 100% room air and Hr 62. Pt states that his pain has dropped to a 8. Pt denis N/V. Pt alert and oreinted

## 2021-11-30 DIAGNOSIS — S43004A Unspecified dislocation of right shoulder joint, initial encounter: Secondary | ICD-10-CM | POA: Diagnosis not present

## 2021-11-30 DIAGNOSIS — T85698A Other mechanical complication of other specified internal prosthetic devices, implants and grafts, initial encounter: Secondary | ICD-10-CM | POA: Diagnosis not present

## 2021-11-30 DIAGNOSIS — Z886 Allergy status to analgesic agent status: Secondary | ICD-10-CM | POA: Diagnosis not present

## 2021-11-30 DIAGNOSIS — S43014A Anterior dislocation of right humerus, initial encounter: Secondary | ICD-10-CM | POA: Diagnosis not present

## 2021-11-30 DIAGNOSIS — N189 Chronic kidney disease, unspecified: Secondary | ICD-10-CM | POA: Diagnosis not present

## 2021-11-30 DIAGNOSIS — M25511 Pain in right shoulder: Secondary | ICD-10-CM | POA: Diagnosis not present

## 2021-12-03 DIAGNOSIS — M810 Age-related osteoporosis without current pathological fracture: Secondary | ICD-10-CM | POA: Diagnosis not present

## 2021-12-04 DIAGNOSIS — Z96611 Presence of right artificial shoulder joint: Secondary | ICD-10-CM | POA: Diagnosis not present

## 2021-12-04 DIAGNOSIS — Z09 Encounter for follow-up examination after completed treatment for conditions other than malignant neoplasm: Secondary | ICD-10-CM | POA: Diagnosis not present

## 2021-12-29 DIAGNOSIS — N401 Enlarged prostate with lower urinary tract symptoms: Secondary | ICD-10-CM | POA: Diagnosis not present

## 2021-12-29 DIAGNOSIS — Z96611 Presence of right artificial shoulder joint: Secondary | ICD-10-CM | POA: Diagnosis not present

## 2021-12-29 DIAGNOSIS — T84028A Dislocation of other internal joint prosthesis, initial encounter: Secondary | ICD-10-CM | POA: Diagnosis not present

## 2021-12-29 DIAGNOSIS — M19011 Primary osteoarthritis, right shoulder: Secondary | ICD-10-CM | POA: Diagnosis not present

## 2021-12-29 DIAGNOSIS — N138 Other obstructive and reflux uropathy: Secondary | ICD-10-CM | POA: Diagnosis not present

## 2021-12-29 DIAGNOSIS — M810 Age-related osteoporosis without current pathological fracture: Secondary | ICD-10-CM | POA: Diagnosis not present

## 2021-12-29 DIAGNOSIS — G8918 Other acute postprocedural pain: Secondary | ICD-10-CM | POA: Diagnosis not present

## 2021-12-29 DIAGNOSIS — N189 Chronic kidney disease, unspecified: Secondary | ICD-10-CM | POA: Diagnosis not present

## 2021-12-29 DIAGNOSIS — Z7982 Long term (current) use of aspirin: Secondary | ICD-10-CM | POA: Diagnosis not present

## 2021-12-29 DIAGNOSIS — M25311 Other instability, right shoulder: Secondary | ICD-10-CM | POA: Diagnosis not present

## 2021-12-29 DIAGNOSIS — Z471 Aftercare following joint replacement surgery: Secondary | ICD-10-CM | POA: Diagnosis not present

## 2021-12-29 DIAGNOSIS — K219 Gastro-esophageal reflux disease without esophagitis: Secondary | ICD-10-CM | POA: Diagnosis not present

## 2021-12-29 DIAGNOSIS — N400 Enlarged prostate without lower urinary tract symptoms: Secondary | ICD-10-CM | POA: Diagnosis not present

## 2022-01-08 DIAGNOSIS — T84019A Broken internal joint prosthesis, unspecified site, initial encounter: Secondary | ICD-10-CM | POA: Diagnosis not present

## 2022-01-08 DIAGNOSIS — Z96619 Presence of unspecified artificial shoulder joint: Secondary | ICD-10-CM | POA: Diagnosis not present

## 2022-01-13 ENCOUNTER — Other Ambulatory Visit: Payer: Self-pay | Admitting: Internal Medicine

## 2022-01-14 NOTE — Telephone Encounter (Signed)
Patient needs a follow up to continue medication refills. Last seen 2021

## 2022-01-15 DIAGNOSIS — Z96611 Presence of right artificial shoulder joint: Secondary | ICD-10-CM | POA: Diagnosis not present

## 2022-01-19 DIAGNOSIS — M4327 Fusion of spine, lumbosacral region: Secondary | ICD-10-CM | POA: Diagnosis not present

## 2022-01-19 DIAGNOSIS — M542 Cervicalgia: Secondary | ICD-10-CM | POA: Diagnosis not present

## 2022-01-19 DIAGNOSIS — N189 Chronic kidney disease, unspecified: Secondary | ICD-10-CM | POA: Diagnosis not present

## 2022-01-19 DIAGNOSIS — Z7982 Long term (current) use of aspirin: Secondary | ICD-10-CM | POA: Diagnosis not present

## 2022-01-19 DIAGNOSIS — M545 Low back pain, unspecified: Secondary | ICD-10-CM | POA: Diagnosis not present

## 2022-01-19 DIAGNOSIS — M47812 Spondylosis without myelopathy or radiculopathy, cervical region: Secondary | ICD-10-CM | POA: Diagnosis not present

## 2022-01-19 DIAGNOSIS — Z87891 Personal history of nicotine dependence: Secondary | ICD-10-CM | POA: Diagnosis not present

## 2022-01-19 DIAGNOSIS — M48061 Spinal stenosis, lumbar region without neurogenic claudication: Secondary | ICD-10-CM | POA: Diagnosis not present

## 2022-01-19 DIAGNOSIS — M1610 Unilateral primary osteoarthritis, unspecified hip: Secondary | ICD-10-CM | POA: Diagnosis not present

## 2022-01-19 DIAGNOSIS — G629 Polyneuropathy, unspecified: Secondary | ICD-10-CM | POA: Diagnosis not present

## 2022-01-19 DIAGNOSIS — Z79899 Other long term (current) drug therapy: Secondary | ICD-10-CM | POA: Diagnosis not present

## 2022-01-25 ENCOUNTER — Other Ambulatory Visit: Payer: Self-pay | Admitting: Internal Medicine

## 2022-02-06 DIAGNOSIS — M8580 Other specified disorders of bone density and structure, unspecified site: Secondary | ICD-10-CM | POA: Diagnosis not present

## 2022-02-06 DIAGNOSIS — M1990 Unspecified osteoarthritis, unspecified site: Secondary | ICD-10-CM | POA: Diagnosis not present

## 2022-02-06 DIAGNOSIS — K219 Gastro-esophageal reflux disease without esophagitis: Secondary | ICD-10-CM | POA: Diagnosis not present

## 2022-02-06 DIAGNOSIS — N529 Male erectile dysfunction, unspecified: Secondary | ICD-10-CM | POA: Diagnosis not present

## 2022-02-06 DIAGNOSIS — J309 Allergic rhinitis, unspecified: Secondary | ICD-10-CM | POA: Diagnosis not present

## 2022-02-06 DIAGNOSIS — M810 Age-related osteoporosis without current pathological fracture: Secondary | ICD-10-CM | POA: Diagnosis not present

## 2022-02-06 DIAGNOSIS — R030 Elevated blood-pressure reading, without diagnosis of hypertension: Secondary | ICD-10-CM | POA: Diagnosis not present

## 2022-02-06 DIAGNOSIS — G629 Polyneuropathy, unspecified: Secondary | ICD-10-CM | POA: Diagnosis not present

## 2022-02-06 DIAGNOSIS — N400 Enlarged prostate without lower urinary tract symptoms: Secondary | ICD-10-CM | POA: Diagnosis not present

## 2022-02-10 DIAGNOSIS — Z85828 Personal history of other malignant neoplasm of skin: Secondary | ICD-10-CM | POA: Diagnosis not present

## 2022-02-10 DIAGNOSIS — D1801 Hemangioma of skin and subcutaneous tissue: Secondary | ICD-10-CM | POA: Diagnosis not present

## 2022-02-10 DIAGNOSIS — D225 Melanocytic nevi of trunk: Secondary | ICD-10-CM | POA: Diagnosis not present

## 2022-02-10 DIAGNOSIS — L570 Actinic keratosis: Secondary | ICD-10-CM | POA: Diagnosis not present

## 2022-02-10 DIAGNOSIS — L821 Other seborrheic keratosis: Secondary | ICD-10-CM | POA: Diagnosis not present

## 2022-02-12 DIAGNOSIS — J90 Pleural effusion, not elsewhere classified: Secondary | ICD-10-CM | POA: Diagnosis not present

## 2022-02-12 DIAGNOSIS — Z96611 Presence of right artificial shoulder joint: Secondary | ICD-10-CM | POA: Diagnosis not present

## 2022-02-12 DIAGNOSIS — M25311 Other instability, right shoulder: Secondary | ICD-10-CM | POA: Diagnosis not present

## 2022-02-17 DIAGNOSIS — H43393 Other vitreous opacities, bilateral: Secondary | ICD-10-CM | POA: Diagnosis not present

## 2022-02-17 DIAGNOSIS — Z961 Presence of intraocular lens: Secondary | ICD-10-CM | POA: Diagnosis not present

## 2022-02-17 DIAGNOSIS — H353130 Nonexudative age-related macular degeneration, bilateral, stage unspecified: Secondary | ICD-10-CM | POA: Diagnosis not present

## 2022-02-17 DIAGNOSIS — H35373 Puckering of macula, bilateral: Secondary | ICD-10-CM | POA: Diagnosis not present

## 2022-02-20 ENCOUNTER — Encounter: Payer: Self-pay | Admitting: Orthopaedic Surgery

## 2022-03-07 ENCOUNTER — Other Ambulatory Visit: Payer: Self-pay | Admitting: Internal Medicine

## 2022-03-10 ENCOUNTER — Ambulatory Visit: Payer: Medicare PPO | Admitting: Orthopaedic Surgery

## 2022-03-10 ENCOUNTER — Ambulatory Visit (INDEPENDENT_AMBULATORY_CARE_PROVIDER_SITE_OTHER): Payer: Medicare PPO

## 2022-03-10 ENCOUNTER — Encounter: Payer: Self-pay | Admitting: Orthopaedic Surgery

## 2022-03-10 DIAGNOSIS — G8929 Other chronic pain: Secondary | ICD-10-CM

## 2022-03-10 DIAGNOSIS — M1712 Unilateral primary osteoarthritis, left knee: Secondary | ICD-10-CM

## 2022-03-10 DIAGNOSIS — M25562 Pain in left knee: Secondary | ICD-10-CM

## 2022-03-10 NOTE — Progress Notes (Signed)
Office Visit Note   Patient: Lucas Shepherd           Date of Birth: 1929/02/14           MRN: [id number] Visit Date: 03/10/2022              Requested by: London Pepper, MD [address],  [place] [address] PCP: London Pepper, MD   Assessment & Plan: Visit Diagnoses:  1. Chronic pain of left knee   2. Unilateral primary osteoarthritis, left knee     Plan: Mr. Baldyga has been complaining of left knee pain for several years but seemingly worse over the last several months.  He has been using a cane as she has had several episodes of his knee wanting to give way.  He has not had any specific injury or trauma.  He does note that he had arthroscopic surgery in 2017.  He does have a history of issues related to NSAIDs so has been careful taking those meds.  Strays demonstrate advanced osteoarthritis particularly in the lateral compartment where there is bone-on-bone.  He has incomplete knee extension.  Long discussion regarding his osteoarthritis and different treatment options including cortisone or even viscosupplementation.  We will try a spider brace as he has had some feeling of instability and he wanted to keep at.  He will continue with a cane and work on his exercises.  Have encouraged him to return if he has more trouble and we could consider local cortisone injection.  Answered all questions and reviewed x-rays with him  Follow-Up Instructions: Return if symptoms worsen or fail to improve.   Orders:  Orders Placed This Encounter  Procedures   XR KNEE 3 VIEW LEFT   No orders of the defined types were placed in this encounter.     Procedures: No procedures performed   Clinical Data: No additional findings.   Subjective: Chief Complaint  Patient presents with   Left Knee - Follow-up   Patient presents today and would like to discuss his left knee pain. Patient states that he has been having ongoing knee pain for two years. He states  that he has been having to use a cane to help with his walking. Over the counter medications are being used to help manage pain.   Review of Systems   Objective: Vital Signs: There were no vitals taken for this visit.  Physical Exam Constitutional:      Appearance: He is well-developed.  Eyes:     Pupils: Pupils are equal, round, and reactive to light.  Pulmonary:     Effort: Pulmonary effort is normal.  Skin:    General: Skin is warm and dry.  Neurological:     Mental Status: He is alert and oriented to person, place, and time.  Psychiatric:        Behavior: Behavior normal.     Ortho Exam awake alert and oriented x3.  Comfortable sitting.  Left knee was not hot warm or red.  No effusion.  Does have callosities about the anterior aspect of the knee.  Increased valgus with diffuse mild to moderate joint pain laterally.  No pain medially.  Lacked about 15 degrees to full extension and flexed over 100 degrees without instability.  No popliteal pain or mass.  No calf pain.  Straight leg raise negative and painless range of motion left hip  Specialty Comments:  No specialty comments available.  Imaging: XR KNEE 3 VIEW LEFT  Result Date: 03/10/2022 Films of the left knee were obtained in 3 projections standing.  There is near bone-on-bone in the lateral compartment with subchondral sclerosis predominantly on the tibial side.  No ectopic calcification.  Increased valgus.  Osteophyte formation laterally and minimally on the medial side.  There is some patellofemoral arthritis as well.  Films are consistent with advanced osteoarthritis predominantly affecting the lateral compartment    PMFS History: Patient Active Problem List   Diagnosis Date Noted   Unilateral primary osteoarthritis, left knee 03/10/2022   Pain in right shoulder 08/21/2021   Pain in right hip 07/03/2020   Low back pain 07/03/2020   Status post laparoscopic fundoplication 08/67/6195   History of peptic ulcer  07/24/2011   Esophageal stricture 06/26/2011   H/O: upper GI bleed 06/26/2011   Edema extremities 06/26/2011   Chest pain 06/26/2011   Anemia due to blood loss, acute 06/26/2011   Syncope and collapse 06/11/2011   Melena 06/11/2011   Anemia 06/11/2011   Azotemia 06/11/2011   Volume depletion 06/11/2011   Leukocytosis 06/11/2011   DYSPHAGIA 08/02/2009   HIATAL HERNIA WITH REFLUX 01/30/2009   VITAMIN B12 DEFICIENCY 01/01/2009   BENIGN PROSTATIC HYPERTROPHY, WITH OBSTRUCTION 12/28/2008   FECAL INCONTINENCE 12/28/2008   GERD 12/27/2008   DIVERTICULOSIS, COLON 12/27/2008   RENAL CALCULUS 12/27/2008   Past Medical History:  Diagnosis Date   Anemia    Arthritis    Blood transfusion    BPH (benign prostatic hyperplasia)    External hemorrhoids    GERD (gastroesophageal reflux disease)    nisseum fundoplication 0932   GI bleed 12/12   got 3 units blood   Hiatal hernia    History of TMJ syndrome    mild   History of urinary frequency    Kidney stones    History of   Osteoporosis    Urinary urgency     Family History  Problem Relation Age of Onset   Heart defect Father        aortic valve   Nephrolithiasis Mother    Lung cancer Brother        smoker   Colon cancer Neg Hx    Esophageal cancer Neg Hx    Stomach cancer Neg Hx    Rectal cancer Neg Hx     Past Surgical History:  Procedure Laterality Date   ABDOMINAL SURGERY  1985   tummy tuc   CATARACT EXTRACTION Bilateral    COLONOSCOPY     ESOPHAGOGASTRODUODENOSCOPY  06/12/2011   Procedure: ESOPHAGOGASTRODUODENOSCOPY (EGD);  Surgeon: Scarlette Shorts, MD;  Location: [facility];  Service: Endoscopy;  Laterality: N/A;   HARDWARE REMOVAL  02/23/2012   Procedure: HARDWARE REMOVAL;  Surgeon: Cammie Sickle., MD;  Location: [facility];  Service: Orthopedics;  Laterality: Left;  biceps tenolysis and screw removal   KNEE ARTHROSCOPY Left 7/13 6712   NISSEN FUNDOPLICATION  4580   ORIF HUMERUS FRACTURE   12/24/2011   Procedure: OPEN REDUCTION INTERNAL FIXATION (ORIF) PROXIMAL HUMERUS FRACTURE;  Surgeon: Cammie Sickle., MD;  Location: [facility];  Service: Orthopedics;  Laterality: Left;  ORIF left humerus   SHOULDER ARTHROSCOPY Right 1954   SHOULDER ARTHROSCOPY Left 05/28/2011   x 2   spine fussion     Social History   Occupational History   Occupation: retired  Tobacco Use   Smoking status: Former    Packs/day: 1.00    Years: 12.00    Total pack years: 12.00  Types: Cigarettes    Quit date: 05/26/1966    Years since quitting: 55.8   Smokeless tobacco: Never  Vaping Use   Vaping Use: Never used  Substance and Sexual Activity   Alcohol use: Yes    Alcohol/week: 1.0 standard drink of alcohol    Types: 1 Shots of liquor per week    Comment: red wine with dinner   Drug use: No   Sexual activity: Never

## 2022-03-11 ENCOUNTER — Encounter: Payer: Self-pay | Admitting: Orthopaedic Surgery

## 2022-03-31 ENCOUNTER — Ambulatory Visit: Payer: Medicare PPO | Admitting: Internal Medicine

## 2022-03-31 ENCOUNTER — Encounter: Payer: Self-pay | Admitting: Internal Medicine

## 2022-03-31 VITALS — BP 98/52 | HR 62 | Ht 69.0 in | Wt 196.4 lb

## 2022-03-31 DIAGNOSIS — K219 Gastro-esophageal reflux disease without esophagitis: Secondary | ICD-10-CM

## 2022-03-31 DIAGNOSIS — R151 Fecal smearing: Secondary | ICD-10-CM | POA: Diagnosis not present

## 2022-03-31 DIAGNOSIS — K5909 Other constipation: Secondary | ICD-10-CM

## 2022-03-31 MED ORDER — FAMOTIDINE 20 MG PO TABS: 20.0000 mg | ORAL_TABLET | Freq: Two times a day (BID) | ORAL | 3 refills | Status: DC

## 2022-03-31 NOTE — Patient Instructions (Signed)
_______________________________________________________  If you are age 86 or older, your body mass index should be between 23-30. Your Body mass index is 29 kg/m. If this is out of the aforementioned range listed, please consider follow up with your Primary Care Provider.  If you are age 71 or younger, your body mass index should be between 19-25. Your Body mass index is 29 kg/m. If this is out of the aformentioned range listed, please consider follow up with your Primary Care Provider.   ________________________________________________________  The [facility] providers would like to encourage you to use [facility] to communicate with providers for non-urgent requests or questions.  Due to long hold times on the telephone, sending your provider a message by [facility] may be a faster and more efficient way to get a response.  Please allow 48 business hours for a response.  Please remember that this is for non-urgent requests.  _______________________________________________________  We have sent the following medications to your pharmacy for you to pick up at your convenience:  Famotadine  Please follow up in 1-2 years

## 2022-03-31 NOTE — Progress Notes (Signed)
   Subjective:    Patient ID: Lucas Shepherd, male    DOB: [birth year], 88 y.o.   MRN: [id number]  HPI Lucas Shepherd is a 88 year old male with a history of adenomatous colon polyps, chronic constipation, internal hemorrhoids with prior banding, fecal smearing, GERD with prior Nissen who is here for follow-up.  He was last seen in November 2021.  Last colonoscopy was June 2018 revealing a small adenoma in the ascending colon.  He reports from a GI perspective he is doing well.  Famotidine twice daily is controlling his GERD and indigestion symptoms.  His bowel habits have remained regular with only minor fecal smearing since using a scoop of MiraLAX and Citrucel daily.  Occasionally stool can be harder at the initiation of defecation but is soft thereafter.  No significant constipation, diarrhea.  He seen no blood in stool or melena.  Appetite is good.  He has arthritic complaints which are not new for him.  He had shoulder replacement and revision in April and June of this year.  After revision in June shoulder issues have been significantly improved.   Review of Systems As per HPI, otherwise negative  Current Medications, Allergies, Past Medical History, Past Surgical History, Family History and Social History were reviewed in [employer] record.     Objective:   Physical Exam Ht '5\' 9"'$  (1.753 m)   Wt 196 lb 6 oz (89.1 kg)   BMI 29.00 kg/m  Gen: awake, alert, NAD HEENT: anicteric  Neuro: nonfocal      Assessment & Plan:  88 year old male with a history of adenomatous colon polyps, chronic constipation, internal hemorrhoids with prior banding, fecal smearing, GERD with prior Nissen who is here for follow-up.  1.  Chronic constipation and fecal smearing --symptoms currently well controlled on bowel regimen of Citrucel and MiraLAX --MiraLAX and Citrucel daily  2.  GERD with prior Nissen fundoplication --doing well with control of heartburn symptoms with  famotidine.  We have avoided PPI because famotidine is working well and he has history of osteoarthritis and osteopenia. --Continue famotidine 20 mg twice daily  3.  History of adenomatous polyps --small nonadvanced adenoma in 2018.  Surveillance would not be recommended prior to 2025 however will be discontinued and deferred due to age.  He understands that we would certainly work-up troublesome GI symptoms should that occur for him in the future  1 to 2-year follow-up, sooner if needed  20 minutes total spent today including patient facing time, coordination of care, reviewing medical history/procedures/pertinent radiology studies, and documentation of the encounter.

## 2022-05-20 DIAGNOSIS — R0982 Postnasal drip: Secondary | ICD-10-CM | POA: Diagnosis not present

## 2022-05-20 DIAGNOSIS — J029 Acute pharyngitis, unspecified: Secondary | ICD-10-CM | POA: Diagnosis not present

## 2022-05-20 DIAGNOSIS — K219 Gastro-esophageal reflux disease without esophagitis: Secondary | ICD-10-CM | POA: Diagnosis not present

## 2022-05-27 DIAGNOSIS — R351 Nocturia: Secondary | ICD-10-CM | POA: Diagnosis not present

## 2022-05-27 DIAGNOSIS — N401 Enlarged prostate with lower urinary tract symptoms: Secondary | ICD-10-CM | POA: Diagnosis not present

## 2022-05-31 ENCOUNTER — Encounter: Payer: Self-pay | Admitting: Internal Medicine

## 2022-06-08 DIAGNOSIS — M810 Age-related osteoporosis without current pathological fracture: Secondary | ICD-10-CM | POA: Diagnosis not present

## 2022-06-21 ENCOUNTER — Encounter: Payer: Self-pay | Admitting: Internal Medicine

## 2022-06-22 MED ORDER — FAMOTIDINE 20 MG PO TABS: ORAL_TABLET | ORAL | 1 refills | Status: DC

## 2022-07-14 ENCOUNTER — Encounter: Payer: Self-pay | Admitting: Orthopaedic Surgery

## 2022-07-14 ENCOUNTER — Ambulatory Visit: Payer: Medicare PPO | Admitting: Orthopaedic Surgery

## 2022-07-14 DIAGNOSIS — M1712 Unilateral primary osteoarthritis, left knee: Secondary | ICD-10-CM | POA: Diagnosis not present

## 2022-07-14 NOTE — Progress Notes (Signed)
Office Visit Note   Patient: Lucas Shepherd           Date of Birth: [birth year]           MRN: [id number] Visit Date: 07/14/2022              Requested by: London Pepper, MD [address],  [place] [address] PCP: London Pepper, MD   Assessment & Plan: Visit Diagnoses:  1. Unilateral primary osteoarthritis, left knee     Plan: Lucas Shepherd has been followed for for the end-stage osteoarthritis of his left knee.  He has tried over-the-counter medicines and even a spider brace but has recently supplemented that with a cane with a feeling of instability.  He had x-rays performed in late August demonstrating bone-on-bone in the lateral compartment with about 7 degrees of valgus.  He has end-stage osteoarthritis.  He really is having an issue with his activities of daily living.  We have talked in detail in the past about different treatment options including total knee replacement.  He is aware that any injection would probably be temporary and would like to consider knee replacement surgery.  Long discussion today regarding that surgery and what he can expect postoperatively.  His wife was with him at home and could certainly be a big help.  His health seems to be just fine .will refer to Dr. Ninfa Linden  Follow-Up Instructions: Return Refer to Dr. Ninfa Linden for consideration of a left total knee replacement.   Orders:  No orders of the defined types were placed in this encounter.  No orders of the defined types were placed in this encounter.     Procedures: No procedures performed   Clinical Data: No additional findings.   Subjective: Chief Complaint  Patient presents with   Left Knee - Pain, Follow-up  Lucas Shepherd is having more more trouble with his left knee.  Prior films demonstrate end-stage osteoarthritis.  He does wear a spica brace and uses over-the-counter medicines but is having more more difficulty extending the knee and performing  activities of daily living.  He wants to discuss different treatment options including knee replacement surgery  HPI  Review of Systems   Objective: Vital Signs: There were no vitals taken for this visit.  Physical Exam Constitutional:      Appearance: He is well-developed.  Eyes:     Pupils: Pupils are equal, round, and reactive to light.  Pulmonary:     Effort: Pulmonary effort is normal.  Skin:    General: Skin is warm and dry.  Neurological:     Mental Status: He is alert and oriented to person, place, and time.  Psychiatric:        Behavior: Behavior normal.     Ortho Exam awake alert and oriented x 3.  Comfortable sitting.  Lacks probably 10 degrees to full extension left knee with increased valgus.  Predominately lateral compartment pain.  No instability.  Some patella crepitation but no pain with compression.  Painless range of motion both hips.  No calf pain.  Motor exam intact.  Good capillary refill to toes. flexes least 110 degrees  Specialty Comments:  No specialty comments available.  Imaging: No results found.   PMFS History: Patient Active Problem List   Diagnosis Date Noted   Unilateral primary osteoarthritis, left knee 03/10/2022   Pain in right shoulder 08/21/2021   Pain in right hip 07/03/2020   Low back pain 07/03/2020   Status  post laparoscopic fundoplication 99/83/3825   History of peptic ulcer 07/24/2011   Esophageal stricture 06/26/2011   H/O: upper GI bleed 06/26/2011   Edema extremities 06/26/2011   Chest pain 06/26/2011   Anemia due to blood loss, acute 06/26/2011   Syncope and collapse 06/11/2011   Melena 06/11/2011   Anemia 06/11/2011   Azotemia 06/11/2011   Volume depletion 06/11/2011   Leukocytosis 06/11/2011   DYSPHAGIA 08/02/2009   HIATAL HERNIA WITH REFLUX 01/30/2009   VITAMIN B12 DEFICIENCY 01/01/2009   BENIGN PROSTATIC HYPERTROPHY, WITH OBSTRUCTION 12/28/2008   FECAL INCONTINENCE 12/28/2008   GERD 12/27/2008    DIVERTICULOSIS, COLON 12/27/2008   RENAL CALCULUS 12/27/2008   Past Medical History:  Diagnosis Date   Anemia    Arthritis    Blood transfusion    BPH (benign prostatic hyperplasia)    External hemorrhoids    GERD (gastroesophageal reflux disease)    nisseum fundoplication 0539   GI bleed 06/2011   got 3 units blood   Hiatal hernia    History of TMJ syndrome    mild   History of urinary frequency    Kidney stones    History of   Osteoporosis    Urinary urgency     Family History  Problem Relation Age of Onset   Heart defect Father        aortic valve   Nephrolithiasis Mother    Lung cancer Brother        smoker   Colon cancer Neg Hx    Esophageal cancer Neg Hx    Stomach cancer Neg Hx    Rectal cancer Neg Hx     Past Surgical History:  Procedure Laterality Date   ABDOMINAL SURGERY  1985   tummy tuc   CATARACT EXTRACTION Bilateral    COLONOSCOPY     ESOPHAGOGASTRODUODENOSCOPY  06/12/2011   Procedure: ESOPHAGOGASTRODUODENOSCOPY (EGD);  Surgeon: Scarlette Shorts, MD;  Location: [facility];  Service: Endoscopy;  Laterality: N/A;   HARDWARE REMOVAL  02/23/2012   Procedure: HARDWARE REMOVAL;  Surgeon: Cammie Sickle., MD;  Location: [facility];  Service: Orthopedics;  Laterality: Left;  biceps tenolysis and screw removal   KNEE ARTHROSCOPY Left 7/13 7673   NISSEN FUNDOPLICATION  4193   ORIF HUMERUS FRACTURE  12/24/2011   Procedure: OPEN REDUCTION INTERNAL FIXATION (ORIF) PROXIMAL HUMERUS FRACTURE;  Surgeon: Cammie Sickle., MD;  Location: [facility];  Service: Orthopedics;  Laterality: Left;  ORIF left humerus   SHOULDER ARTHROSCOPY Right 1954   SHOULDER ARTHROSCOPY Left 05/28/2011   x 2   spine fussion     Social History   Occupational History   Occupation: retired  Tobacco Use   Smoking status: Former    Packs/day: 1.00    Years: 12.00    Total pack years: 12.00    Types: Cigarettes    Quit date: 05/26/1966    Years since  quitting: 56.1   Smokeless tobacco: Never  Vaping Use   Vaping Use: Never used  Substance and Sexual Activity   Alcohol use: Yes    Alcohol/week: 1.0 standard drink of alcohol    Types: 1 Shots of liquor per week    Comment: red wine with dinner   Drug use: No   Sexual activity: Never     Garald Balding, MD   Note - This record has been created using Editor, commissioning.  Chart creation errors have been sought, but may not always  have been  located. Such creation errors do not reflect on  the standard of medical care.

## 2022-08-03 ENCOUNTER — Encounter: Payer: Self-pay | Admitting: Orthopaedic Surgery

## 2022-08-03 ENCOUNTER — Ambulatory Visit: Payer: Medicare PPO | Admitting: Orthopaedic Surgery

## 2022-08-03 DIAGNOSIS — M1712 Unilateral primary osteoarthritis, left knee: Secondary | ICD-10-CM

## 2022-08-03 DIAGNOSIS — M25562 Pain in left knee: Secondary | ICD-10-CM

## 2022-08-03 DIAGNOSIS — G8929 Other chronic pain: Secondary | ICD-10-CM | POA: Diagnosis not present

## 2022-08-03 NOTE — Progress Notes (Signed)
The patient is a very pleasant 89 year old gentleman referred to me from Dr. Durward Fortes to evaluate and treat severe end-stage arthritis of his left knee.  He has tried and failed all forms of conservative treatment for that knee.  He is even had arthroscopic surgery on the knee.  I have reviewed his x-rays and reviewed the x-rays with him and he has bone-on-bone wear of all 3 compartments especially the lateral compartment.  He does ambulate with a cane.  He has had a history of a right reverse shoulder arthroplasty.  He has had a spinal fusion at L2-L3 and L3-L4.  He has osteoporosis.  He is not on blood thinning medication.  He cannot tolerate aspirin or NSAIDs.  He currently denies any headache, chest pain, shortness of breath, fever, chills, nausea, vomiting.  He is on medication for osteoporosis as well.  I was able to review all of his notes within epic in terms of past medical history, past surgical history and medications.  He is interested at this point and considering knee replacement surgery.  He said at this point his knee pain is daily and it is detrimentally affecting his mobility, his quality of life and his actives daily living.  Examination of his left knee does show moderate effusion.  He has valgus malalignment of that knee.  There is pain throughout the arc of flexion extension of the knee and significant patellofemoral crepitation.  At this point I did discuss in detail knee replacement surgery.  I showed him a knee replacement model.  I explained the risks and benefits of the surgery and what to expect from an intraoperative and postoperative course.  He would need to be on low-dose Eliquis for 2 weeks after surgery as a blood thinner.  He does have an upcoming virtual visit with his osteoporosis specialist.  He will let us know if he would like to [place] to schedule him for a left knee replacement.  All question concerns were addressed and answered.

## 2022-08-07 DIAGNOSIS — M25562 Pain in left knee: Secondary | ICD-10-CM | POA: Diagnosis not present

## 2022-08-13 DIAGNOSIS — D225 Melanocytic nevi of trunk: Secondary | ICD-10-CM | POA: Diagnosis not present

## 2022-08-13 DIAGNOSIS — Z85828 Personal history of other malignant neoplasm of skin: Secondary | ICD-10-CM | POA: Diagnosis not present

## 2022-08-13 DIAGNOSIS — L570 Actinic keratosis: Secondary | ICD-10-CM | POA: Diagnosis not present

## 2022-08-13 DIAGNOSIS — L821 Other seborrheic keratosis: Secondary | ICD-10-CM | POA: Diagnosis not present

## 2022-08-18 DIAGNOSIS — M438X4 Other specified deforming dorsopathies, thoracic region: Secondary | ICD-10-CM | POA: Diagnosis not present

## 2022-08-18 DIAGNOSIS — Z96611 Presence of right artificial shoulder joint: Secondary | ICD-10-CM | POA: Diagnosis not present

## 2022-08-21 ENCOUNTER — Telehealth: Payer: Self-pay | Admitting: Orthopaedic Surgery

## 2022-08-21 NOTE — Telephone Encounter (Signed)
Patient states he is waiting on a call about surgery --[phone]

## 2022-08-24 ENCOUNTER — Encounter: Payer: Self-pay | Admitting: Orthopaedic Surgery

## 2022-08-24 NOTE — Telephone Encounter (Signed)
I called patient and scheduled surgery. 

## 2022-08-26 ENCOUNTER — Other Ambulatory Visit: Payer: Self-pay

## 2022-09-03 DIAGNOSIS — Z96611 Presence of right artificial shoulder joint: Secondary | ICD-10-CM | POA: Diagnosis not present

## 2022-09-07 DIAGNOSIS — Z0000 Encounter for general adult medical examination without abnormal findings: Secondary | ICD-10-CM | POA: Diagnosis not present

## 2022-09-07 DIAGNOSIS — E7800 Pure hypercholesterolemia, unspecified: Secondary | ICD-10-CM | POA: Diagnosis not present

## 2022-09-07 DIAGNOSIS — M810 Age-related osteoporosis without current pathological fracture: Secondary | ICD-10-CM | POA: Diagnosis not present

## 2022-09-07 DIAGNOSIS — E538 Deficiency of other specified B group vitamins: Secondary | ICD-10-CM | POA: Diagnosis not present

## 2022-09-07 DIAGNOSIS — Z9989 Dependence on other enabling machines and devices: Secondary | ICD-10-CM | POA: Diagnosis not present

## 2022-09-07 DIAGNOSIS — K219 Gastro-esophageal reflux disease without esophagitis: Secondary | ICD-10-CM | POA: Diagnosis not present

## 2022-09-28 ENCOUNTER — Encounter: Payer: Self-pay | Admitting: Orthopaedic Surgery

## 2022-10-08 ENCOUNTER — Encounter: Payer: Self-pay | Admitting: Orthopaedic Surgery

## 2022-10-09 DIAGNOSIS — Z96611 Presence of right artificial shoulder joint: Secondary | ICD-10-CM | POA: Diagnosis not present

## 2022-10-14 ENCOUNTER — Encounter: Payer: Self-pay | Admitting: Orthopaedic Surgery

## 2022-10-15 ENCOUNTER — Other Ambulatory Visit: Payer: Self-pay | Admitting: Physician Assistant

## 2022-10-15 DIAGNOSIS — Z01818 Encounter for other preprocedural examination: Secondary | ICD-10-CM

## 2022-10-19 NOTE — Pre-Procedure Instructions (Signed)
Surgical Instructions    Your procedure is scheduled on October 27, 2022.  Report to [facility] Main Entrance "A" at 5:30 A.M., then check in with the Admitting office.  Call this number if you have problems the morning of surgery:  [phone]  If you have any questions prior to your surgery date call [phone]: Open Monday-Friday 8am-4pm If you experience any cold or flu symptoms such as cough, fever, chills, shortness of breath, etc. between now and your scheduled surgery, please notify us at the above number.     Remember:  Do not eat after midnight the night before your surgery  You may drink clear liquids until 4:30 AM the morning of your surgery.   Clear liquids allowed are: Water, Non-Citrus Juices (without pulp), Carbonated Beverages, Clear Tea, Black Coffee Only (NO MILK, CREAM OR POWDERED CREAMER of any kind), and Gatorade.  Patient Instructions  The night before surgery:  No food after midnight. ONLY clear liquids after midnight  The day of surgery (if you do NOT have diabetes):  Drink ONE (1) Pre-Surgery Clear Ensure by 4:30 AM the morning of surgery. Drink in one sitting. Do not sip.  This drink was given to you during your hospital  pre-op appointment visit.  Nothing else to drink after completing the  Pre-Surgery Clear Ensure.         If you have questions, please contact your surgeon's office.     Take these medicines the morning of surgery with A SIP OF WATER:  famotidine (PEPCID)   oxyCODONE (OXY IR/ROXICODONE) - may take if needed   As of today, STOP taking any Aspirin (unless otherwise instructed by your surgeon) Aleve, Naproxen, Ibuprofen, Motrin, Advil, Goody's, BC's, all herbal medications, fish oil, and all vitamins. This includes your medication: diclofenac Sodium (VOLTAREN) GEL                      Do NOT Smoke (Tobacco/Vaping) for 24 hours prior to your procedure.  If you use a CPAP at night, you may bring your mask/headgear for your overnight  stay.   Contacts, glasses, piercing's, hearing aid's, dentures or partials may not be worn into surgery, please bring cases for these belongings.    For patients admitted to the hospital, discharge time will be determined by your treatment team.   Patients discharged the day of surgery will not be allowed to drive home, and someone needs to stay with them for 24 hours.  SURGICAL WAITING ROOM VISITATION Patients having surgery or a procedure may have no more than 2 support people in the waiting area - these visitors may rotate.   Children under the age of 24 must have an adult with them who is not the patient. If the patient needs to stay at the hospital during part of their recovery, the visitor guidelines for inpatient rooms apply. Pre-op nurse will coordinate an appropriate time for 1 support person to accompany patient in pre-op.  This support person may not rotate.   Please refer to the [facility] website for the visitor guidelines for Inpatients (after your surgery is over and you are in a regular room).     [facility]- Preparing For Surgery  Please follow the instructions on the handout you received at your pre-admission appointment about preparing for your upcoming surgery using the CHG surgical soap. If you have any questions or concerns, please call one of the numbers listed on the first page of this paperwork.  Please read over the following fact sheets that you were given.    If you received a COVID test during your pre-op visit  it is requested that you wear a mask when out in public, stay away from anyone that may not be feeling well and notify your surgeon if you develop symptoms. If you have been in contact with anyone that has tested positive in the last 10 days please notify you surgeon.

## 2022-10-20 ENCOUNTER — Encounter (HOSPITAL_COMMUNITY): Payer: Self-pay

## 2022-10-20 ENCOUNTER — Encounter (HOSPITAL_COMMUNITY)
Admission: RE | Admit: 2022-10-20 | Discharge: 2022-10-20 | Disposition: A | Discharge status: Home / Self Care | Payer: Medicare PPO | Source: Ambulatory Visit | Attending: Orthopaedic Surgery | Admitting: Orthopaedic Surgery

## 2022-10-20 ENCOUNTER — Other Ambulatory Visit: Payer: Self-pay

## 2022-10-20 VITALS — BP 123/58 | HR 49 | Temp 97.8°F | Resp 18 | Ht 69.0 in | Wt 190.5 lb

## 2022-10-20 DIAGNOSIS — Z01818 Encounter for other preprocedural examination: Secondary | ICD-10-CM | POA: Diagnosis not present

## 2022-10-20 HISTORY — DX: Personal history of urinary calculi: Z87.442

## 2022-10-20 LAB — COMPREHENSIVE METABOLIC PANEL
ALT: 15 U/L (ref 0–44)
AST: 23 U/L (ref 15–41)
Albumin: 4 g/dL (ref 3.5–5.0)
Alkaline Phosphatase: 37 U/L — ABNORMAL LOW (ref 38–126)
Anion gap: 9 (ref 5–15)
BUN: 23 mg/dL (ref 8–23)
CO2: 27 mmol/L (ref 22–32)
Calcium: 9.4 mg/dL (ref 8.9–10.3)
Chloride: 103 mmol/L (ref 98–111)
Creatinine, Ser: 1.14 mg/dL (ref 0.61–1.24)
GFR, Estimated: >60 mL/min (ref >60)
Glucose, Bld: 95 mg/dL (ref 70–99)
Potassium: 4.7 mmol/L (ref 3.5–5.1)
Sodium: 139 mmol/L (ref 135–145)
Total Bilirubin: 0.7 mg/dL (ref 0.3–1.2)
Total Protein: 7.1 g/dL (ref 6.5–8.1)

## 2022-10-20 LAB — CBC
HCT: 40.1 % (ref 39.0–52.0)
Hemoglobin: 13.7 g/dL (ref 13.0–17.0)
MCH: 32.7 pg (ref 26.0–34.0)
MCHC: 34.2 g/dL (ref 30.0–36.0)
MCV: 95.7 fL (ref 80.0–100.0)
Platelets: 195 10*3/uL (ref 150–400)
RBC: 4.19 MIL/uL — ABNORMAL LOW (ref 4.22–5.81)
RDW: 13.5 % (ref 11.5–15.5)
WBC: 7.2 10*3/uL (ref 4.0–10.5)
nRBC: 0 % (ref 0.0–0.2)

## 2022-10-20 LAB — SURGICAL PCR SCREEN
MRSA, PCR: NEGATIVE
Staphylococcus aureus: NEGATIVE

## 2022-10-20 LAB — TYPE AND SCREEN
ABO/RH(D): O POS
Antibody Screen: NEGATIVE

## 2022-10-20 NOTE — Progress Notes (Signed)
PCP - Dr. Farris Has Cardiologist - Denies  PPM/ICD - Denies Device Orders - n/a Rep Notified - n/a  Chest x-ray - n/a EKG - 10/18/2019 for CP, but EKG normal and CP not related to cardiac issues Stress Test - Denies ECHO - 06/12/2011 - pt had syncopal episode, but it was related to GI bleed/Anemia.  Cardiac Cath - Denies  Sleep Study - Denies CPAP - n/a  No DM  Last dose of GLP1 agonist- n/a GLP1 instructions: n/a  Blood Thinner Instructions: n/a Aspirin Instructions: n/a  ERAS Protcol - Clear liquids until 0430 morning of surgery PRE-SURGERY Ensure or G2- Ensure given to pt with instructions  COVID TEST- n/a   Anesthesia review: No.   Patient denies shortness of breath, fever, cough and chest pain at PAT appointment. Pt denies any respiratory illness/infection in the last two months.   All instructions explained to the patient, with a verbal understanding of the material. Patient agrees to go over the instructions while at home for a better understanding. Patient also instructed to self quarantine after being tested for COVID-19. The opportunity to ask questions was provided.

## 2022-10-21 ENCOUNTER — Telehealth: Payer: Self-pay | Admitting: Orthopaedic Surgery

## 2022-10-21 NOTE — Telephone Encounter (Signed)
I called and talked to the pt. He wants to know if he can stop his PRESERVISION AREDS the day before surgery. Please advise. Wants to be cb @ [phone]

## 2022-10-21 NOTE — Telephone Encounter (Signed)
Called and advised. He stated understanding  °

## 2022-10-21 NOTE — Telephone Encounter (Signed)
Patient states he has questions about his vitamins and id he should take them due to surgery on Tuesday. Please call.

## 2022-10-23 DIAGNOSIS — Z09 Encounter for follow-up examination after completed treatment for conditions other than malignant neoplasm: Secondary | ICD-10-CM | POA: Diagnosis not present

## 2022-10-23 DIAGNOSIS — R936 Abnormal findings on diagnostic imaging of limbs: Secondary | ICD-10-CM | POA: Diagnosis not present

## 2022-10-23 DIAGNOSIS — Z96611 Presence of right artificial shoulder joint: Secondary | ICD-10-CM | POA: Diagnosis not present

## 2022-10-24 ENCOUNTER — Encounter: Payer: Self-pay | Admitting: Orthopaedic Surgery

## 2022-10-26 ENCOUNTER — Telehealth: Payer: Self-pay | Admitting: *Deleted

## 2022-10-26 NOTE — Telephone Encounter (Signed)
Ortho bundle pre-op call completed. 

## 2022-10-26 NOTE — Care Plan (Signed)
[facility] RNCM call to patient to discuss his upcoming Left total knee arthroplasty with Dr. Magnus Ivan on 10/27/22. He is an Ortho bundle patient through [facility] and is agreeable to case management. He lives with his spouse, who will be assisting at home after discharge. He also has several children that live in the area that can assist if needed. He has a RW and cane. Anticipate HHPT will be needed after a short hospital stay. Referral made to [facility] after choice provided. Reviewed post op care instructions. Will continue to follow for needs.

## 2022-10-26 NOTE — H&P (Signed)
TOTAL KNEE ADMISSION H&P  Patient is being admitted for left total knee arthroplasty.  Subjective:  Chief Complaint:left knee pain.  HPI: Lucas Shepherd, 89 y.o. male, has a history of pain and functional disability in the left knee due to arthritis and has failed non-surgical conservative treatments for greater than 12 weeks to includecorticosteriod injections, flexibility and strengthening excercises, use of assistive devices, and activity modification.  Onset of symptoms was gradual, starting 3 years ago with gradually worsening course since that time. The patient noted prior procedures on the knee to include  arthroscopy on the left knee(s).  Patient currently rates pain in the left knee(s) at 10 out of 10 with activity. Patient has night pain, worsening of pain with activity and weight bearing, pain that interferes with activities of daily living, pain with passive range of motion, crepitus, and joint swelling.  Patient has evidence of subchondral sclerosis, periarticular osteophytes, and joint space narrowing by imaging studies. There is no active infection.  Patient Active Problem List   Diagnosis Date Noted   Unilateral primary osteoarthritis, left knee 03/10/2022   Pain in right shoulder 08/21/2021   Pain in right hip 07/03/2020   Low back pain 07/03/2020   Status post laparoscopic fundoplication 07/24/2011   History of peptic ulcer 07/24/2011   Esophageal stricture 06/26/2011   H/O: upper GI bleed 06/26/2011   Edema extremities 06/26/2011   Chest pain 06/26/2011   Anemia due to blood loss, acute 06/26/2011   Syncope and collapse 06/11/2011   Melena 06/11/2011   Anemia 06/11/2011   Azotemia 06/11/2011   Volume depletion 06/11/2011   Leukocytosis 06/11/2011   DYSPHAGIA 08/02/2009   HIATAL HERNIA WITH REFLUX 01/30/2009   VITAMIN B12 DEFICIENCY 01/01/2009   BENIGN PROSTATIC HYPERTROPHY, WITH OBSTRUCTION 12/28/2008   FECAL INCONTINENCE 12/28/2008   GERD 12/27/2008    DIVERTICULOSIS, COLON 12/27/2008   RENAL CALCULUS 12/27/2008   Past Medical History:  Diagnosis Date   Anemia    related to past gastric ulcers   Arthritis    Blood transfusion    BPH (benign prostatic hyperplasia)    External hemorrhoids    GERD (gastroesophageal reflux disease)    nisseum fundoplication 2010   GI bleed 06/2011   got 3 units blood   Hiatal hernia    History of kidney stones    History of TMJ syndrome    mild   History of urinary frequency    Osteoporosis    Urinary urgency     Past Surgical History:  Procedure Laterality Date   ABDOMINAL SURGERY  1985   tummy tuc   CATARACT EXTRACTION Bilateral    COLONOSCOPY     ESOPHAGOGASTRODUODENOSCOPY  06/12/2011   Procedure: ESOPHAGOGASTRODUODENOSCOPY (EGD);  Surgeon: Yancey Flemings, MD;  Location: [facility];  Service: Endoscopy;  Laterality: N/A;   HARDWARE REMOVAL  02/23/2012   Procedure: HARDWARE REMOVAL;  Surgeon: Wyn Forster., MD;  Location: [facility];  Service: Orthopedics;  Laterality: Left;  biceps tenolysis and screw removal   HERNIA REPAIR  2013   Hiatal Hernia Repair   KNEE ARTHROSCOPY Left 7/13 2017   NISSEN FUNDOPLICATION  2010   ORIF HUMERUS FRACTURE  12/24/2011   Procedure: OPEN REDUCTION INTERNAL FIXATION (ORIF) PROXIMAL HUMERUS FRACTURE;  Surgeon: Wyn Forster., MD;  Location: [facility];  Service: Orthopedics;  Laterality: Left;  ORIF left humerus   SHOULDER ARTHROSCOPY Right 1954   SHOULDER ARTHROSCOPY Left 05/28/2011   x 2  spine fussion     x2   TOTAL SHOULDER ARTHROPLASTY Right 2023   x2    Current Facility-Administered Medications  Medication Dose Route Frequency Provider Last Rate Last Admin   0.9 %  sodium chloride infusion  500 mL Intravenous Continuous Pyrtle, Carie Caddy, MD       Current Outpatient Medications  Medication Sig Dispense Refill Last Dose   Calcium Carb-Cholecalciferol (CALCIUM + D3 PO) Take 1 tablet by mouth daily after  breakfast.      COLLAGEN PO Take 1 Scoop by mouth daily before breakfast. (Coffee)      cyanocobalamin (VITAMIN B12) 1000 MCG tablet Take 1,000 mcg by mouth 3 (three) times a week. (Sundays, Tuesdays & Fridays)      denosumab (PROLIA) 60 MG/ML SOSY injection Inject 60 mg into the skin every 6 (six) months.      diclofenac Sodium (VOLTAREN) 1 % GEL Apply 1 Application topically 4 (four) times daily as needed (pain.).      famotidine (PEPCID) 20 MG tablet Take 1 tablet (20 mg) by mouth every morning and 2 tablets (40 mg) by mouth every evening. 270 tablet 1    finasteride (PROSCAR) 5 MG tablet Take 5 mg by mouth at bedtime.      fluticasone (FLONASE) 50 MCG/ACT nasal spray Place 1 spray into both nostrils at bedtime.      gabapentin (NEURONTIN) 300 MG capsule Take 300 mg by mouth daily after supper.      Magnesium Oxide, Laxative, 500 MG TABS Take 500 mg by mouth daily after breakfast.      Methylcellulose, Laxative, (CITRUCEL FIBERSHAKE PO) Take 1 Scoop by mouth daily before breakfast. Mix 1 scoopful into 8 ounces of water and drink every morning      Multiple Vitamin (MULTIVITAMIN WITH MINERALS) TABS tablet Take 1 tablet by mouth daily after breakfast.      Multiple Vitamins-Minerals (PRESERVISION AREDS 2 PO) Take 1 tablet by mouth in the morning and at bedtime.      oxyCODONE (OXY IR/ROXICODONE) 5 MG immediate release tablet Take 2.5 mg by mouth at bedtime as needed for severe pain.      polyethylene glycol powder (GLYCOLAX/MIRALAX) 17 GM/SCOOP powder Take 17 g by mouth daily before breakfast. (Coffee)      tamsulosin (FLOMAX) 0.4 MG CAPS capsule Take 0.4 mg by mouth at bedtime.      TURMERIC PO Take 1 capsule by mouth daily after breakfast.      Allergies  Allergen Reactions   Aspirin Other (See Comments)    History of ulcers   Nsaids Other (See Comments)    History of ulcers    Social History   Tobacco Use   Smoking status: Former    Packs/day: 1.00    Years: 12.00    Additional  pack years: 0.00    Total pack years: 12.00    Types: Cigarettes    Quit date: 05/26/1966    Years since quitting: 56.4   Smokeless tobacco: Never  Substance Use Topics   Alcohol use: Yes    Alcohol/week: 1.0 standard drink of alcohol    Types: 1 Shots of liquor per week    Comment: red wine with dinner    Family History  Problem Relation Age of Onset   Heart defect Father        aortic valve   Nephrolithiasis Mother    Lung cancer Brother        smoker   Colon cancer Neg Hx  Esophageal cancer Neg Hx    Stomach cancer Neg Hx    Rectal cancer Neg Hx      Review of Systems  Objective:  Physical Exam Vitals reviewed.  Constitutional:      Appearance: Normal appearance. He is normal weight.  HENT:     Head: Normocephalic and atraumatic.  Eyes:     Extraocular Movements: Extraocular movements intact.     Pupils: Pupils are equal, round, and reactive to light.  Cardiovascular:     Rate and Rhythm: Normal rate.     Pulses: Normal pulses.  Pulmonary:     Effort: Pulmonary effort is normal.     Breath sounds: Normal breath sounds.  Abdominal:     Palpations: Abdomen is soft.  Musculoskeletal:     Left knee: Effusion, bony tenderness and crepitus present. Decreased range of motion. Tenderness present over the medial joint line, lateral joint line and patellar tendon. Abnormal alignment.  Neurological:     Mental Status: He is alert and oriented to person, place, and time.  Psychiatric:        Behavior: Behavior normal.     Vital signs in last 24 hours:    Labs:   Estimated body mass index is 28.13 kg/m as calculated from the following:   Height as of 10/20/22:  (1.753 m).   Weight as of 10/20/22: 86.4 kg.   Imaging Review Plain radiographs demonstrate severe degenerative joint disease of the left knee(s). The overall alignment ismild valgus. The bone quality appears to be fair for age and reported activity level.      Assessment/Plan:  End stage  arthritis, left knee   The patient history, physical examination, clinical judgment of the provider and imaging studies are consistent with end stage degenerative joint disease of the left knee(s) and total knee arthroplasty is deemed medically necessary. The treatment options including medical management, injection therapy arthroscopy and arthroplasty were discussed at length. The risks and benefits of total knee arthroplasty were presented and reviewed. The risks due to aseptic loosening, infection, stiffness, patella tracking problems, thromboembolic complications and other imponderables were discussed. The patient acknowledged the explanation, agreed to proceed with the plan and consent was signed. Patient is being admitted for inpatient treatment for surgery, pain control, PT, OT, prophylactic antibiotics, VTE prophylaxis, progressive ambulation and ADL's and discharge planning. The patient is planning to be discharged home with home health services

## 2022-10-27 ENCOUNTER — Ambulatory Visit (HOSPITAL_BASED_OUTPATIENT_CLINIC_OR_DEPARTMENT_OTHER): Payer: Medicare PPO

## 2022-10-27 ENCOUNTER — Encounter (HOSPITAL_COMMUNITY)
Admission: RE | Disposition: A | Discharge status: Home Health | Payer: Self-pay | Source: Home / Self Care | Attending: Orthopaedic Surgery

## 2022-10-27 ENCOUNTER — Observation Stay (HOSPITAL_COMMUNITY)
Admission: RE | Admit: 2022-10-27 | Discharge: 2022-10-29 | Disposition: A | Discharge status: Home Health | Payer: Medicare PPO | Attending: Orthopaedic Surgery | Admitting: Orthopaedic Surgery

## 2022-10-27 ENCOUNTER — Observation Stay (HOSPITAL_COMMUNITY): Payer: Medicare PPO

## 2022-10-27 ENCOUNTER — Ambulatory Visit (HOSPITAL_COMMUNITY): Payer: Medicare PPO

## 2022-10-27 ENCOUNTER — Other Ambulatory Visit: Payer: Self-pay

## 2022-10-27 ENCOUNTER — Encounter (HOSPITAL_COMMUNITY): Payer: Self-pay | Admitting: Orthopaedic Surgery

## 2022-10-27 DIAGNOSIS — Z87891 Personal history of nicotine dependence: Secondary | ICD-10-CM | POA: Insufficient documentation

## 2022-10-27 DIAGNOSIS — Z96652 Presence of left artificial knee joint: Secondary | ICD-10-CM | POA: Diagnosis not present

## 2022-10-27 DIAGNOSIS — M1712 Unilateral primary osteoarthritis, left knee: Secondary | ICD-10-CM | POA: Diagnosis not present

## 2022-10-27 DIAGNOSIS — Z96611 Presence of right artificial shoulder joint: Secondary | ICD-10-CM | POA: Diagnosis not present

## 2022-10-27 DIAGNOSIS — Z471 Aftercare following joint replacement surgery: Secondary | ICD-10-CM | POA: Diagnosis not present

## 2022-10-27 DIAGNOSIS — G8918 Other acute postprocedural pain: Secondary | ICD-10-CM | POA: Diagnosis not present

## 2022-10-27 HISTORY — PX: TOTAL KNEE ARTHROPLASTY: SHX125

## 2022-10-27 SURGERY — ARTHROPLASTY, KNEE, TOTAL
Anesthesia: Regional | Site: Knee | Laterality: Left

## 2022-10-27 MED ORDER — PROPOFOL 10 MG/ML IV BOLUS
INTRAVENOUS | Status: DC | PRN
  Administered 2022-10-27: 150 mg via INTRAVENOUS
  Administered 2022-10-27: 25 ug/kg/min via INTRAVENOUS

## 2022-10-27 MED ORDER — SODIUM CHLORIDE 0.9 % IV SOLN: INTRAVENOUS | Status: DC

## 2022-10-27 MED ORDER — CHLORHEXIDINE GLUCONATE 0.12 % MT SOLN: 15.0000 mL | Freq: Once | OROMUCOSAL | Status: AC

## 2022-10-27 MED ORDER — MIDAZOLAM HCL 2 MG/2ML IJ SOLN
INTRAMUSCULAR | Status: DC | PRN
  Administered 2022-10-27: 1 mg via INTRAVENOUS

## 2022-10-27 MED ORDER — FINASTERIDE 5 MG PO TABS
5.0000 mg | ORAL_TABLET | Freq: Every day | ORAL | Status: DC
  Administered 2022-10-27 – 2022-10-28 (×2): 5 mg via ORAL
  Filled 2022-10-27 (×2): qty 1

## 2022-10-27 MED ORDER — PROMETHAZINE HCL 25 MG/ML IJ SOLN: 6.2500 mg | INTRAMUSCULAR | Status: DC | PRN

## 2022-10-27 MED ORDER — PROPOFOL 10 MG/ML IV BOLUS
INTRAVENOUS | Status: AC
  Filled 2022-10-27: qty 20

## 2022-10-27 MED ORDER — PHENOL 1.4 % MT LIQD: 1.0000 | OROMUCOSAL | Status: DC | PRN

## 2022-10-27 MED ORDER — BUPIVACAINE-EPINEPHRINE (PF) 0.5% -1:200000 IJ SOLN
INTRAMUSCULAR | Status: DC | PRN
  Administered 2022-10-27: 30 mL

## 2022-10-27 MED ORDER — MIDAZOLAM HCL 2 MG/2ML IJ SOLN
INTRAMUSCULAR | Status: AC
  Filled 2022-10-27: qty 2

## 2022-10-27 MED ORDER — ONDANSETRON HCL 4 MG/2ML IJ SOLN: 4.0000 mg | Freq: Four times a day (QID) | INTRAMUSCULAR | Status: DC | PRN

## 2022-10-27 MED ORDER — FAMOTIDINE 20 MG PO TABS
40.0000 mg | ORAL_TABLET | Freq: Two times a day (BID) | ORAL | Status: DC
  Administered 2022-10-27 – 2022-10-29 (×4): 40 mg via ORAL
  Filled 2022-10-27 (×4): qty 2

## 2022-10-27 MED ORDER — POVIDONE-IODINE 10 % EX SWAB
2.0000 | Freq: Once | CUTANEOUS | Status: AC
  Administered 2022-10-27: 2 via TOPICAL

## 2022-10-27 MED ORDER — LACTATED RINGERS IV SOLN: INTRAVENOUS | Status: DC

## 2022-10-27 MED ORDER — ACETAMINOPHEN 325 MG PO TABS
325.0000 mg | ORAL_TABLET | Freq: Four times a day (QID) | ORAL | Status: DC | PRN
  Administered 2022-10-28 – 2022-10-29 (×5): 650 mg via ORAL
  Filled 2022-10-27 (×5): qty 2

## 2022-10-27 MED ORDER — METHOCARBAMOL 500 MG PO TABS
500.0000 mg | ORAL_TABLET | Freq: Four times a day (QID) | ORAL | Status: DC | PRN
  Administered 2022-10-27 – 2022-10-28 (×5): 500 mg via ORAL
  Filled 2022-10-27 (×4): qty 1

## 2022-10-27 MED ORDER — GABAPENTIN 300 MG PO CAPS
300.0000 mg | ORAL_CAPSULE | Freq: Every day | ORAL | Status: DC
  Administered 2022-10-27 – 2022-10-28 (×2): 300 mg via ORAL
  Filled 2022-10-27 (×2): qty 1

## 2022-10-27 MED ORDER — CHLORHEXIDINE GLUCONATE 0.12 % MT SOLN
OROMUCOSAL | Status: AC
  Administered 2022-10-27: 15 mL via OROMUCOSAL
  Filled 2022-10-27: qty 15

## 2022-10-27 MED ORDER — MENTHOL 3 MG MT LOZG: 1.0000 | LOZENGE | OROMUCOSAL | Status: DC | PRN

## 2022-10-27 MED ORDER — DIPHENHYDRAMINE HCL 12.5 MG/5ML PO ELIX: 12.5000 mg | ORAL_SOLUTION | ORAL | Status: DC | PRN

## 2022-10-27 MED ORDER — POLYETHYLENE GLYCOL 3350 17 G PO PACK: 17.0000 g | PACK | Freq: Every day | ORAL | Status: DC | PRN

## 2022-10-27 MED ORDER — TRANEXAMIC ACID-NACL 1000-0.7 MG/100ML-% IV SOLN
INTRAVENOUS | Status: AC
  Filled 2022-10-27: qty 100

## 2022-10-27 MED ORDER — FENTANYL CITRATE (PF) 250 MCG/5ML IJ SOLN
INTRAMUSCULAR | Status: AC
  Filled 2022-10-27: qty 5

## 2022-10-27 MED ORDER — DOCUSATE SODIUM 100 MG PO CAPS
100.0000 mg | ORAL_CAPSULE | Freq: Two times a day (BID) | ORAL | Status: DC
  Administered 2022-10-27 – 2022-10-29 (×5): 100 mg via ORAL
  Filled 2022-10-27 (×5): qty 1

## 2022-10-27 MED ORDER — CEFAZOLIN SODIUM-DEXTROSE 1-4 GM/50ML-% IV SOLN
1.0000 g | Freq: Four times a day (QID) | INTRAVENOUS | Status: AC
  Administered 2022-10-27 (×2): 1 g via INTRAVENOUS
  Filled 2022-10-27 (×2): qty 50

## 2022-10-27 MED ORDER — FENTANYL CITRATE (PF) 250 MCG/5ML IJ SOLN
INTRAMUSCULAR | Status: DC | PRN
  Administered 2022-10-27 (×2): 50 ug via INTRAVENOUS
  Administered 2022-10-27: 100 ug via INTRAVENOUS
  Administered 2022-10-27 (×2): 50 ug via INTRAVENOUS

## 2022-10-27 MED ORDER — OXYCODONE HCL 5 MG PO TABS: 5.0000 mg | ORAL_TABLET | Freq: Once | ORAL | Status: DC | PRN

## 2022-10-27 MED ORDER — CEFAZOLIN SODIUM-DEXTROSE 2-4 GM/100ML-% IV SOLN
INTRAVENOUS | Status: AC
  Filled 2022-10-27: qty 100

## 2022-10-27 MED ORDER — HYDROMORPHONE HCL 1 MG/ML IJ SOLN: 0.5000 mg | INTRAMUSCULAR | Status: DC | PRN

## 2022-10-27 MED ORDER — PHENYLEPHRINE HCL-NACL 20-0.9 MG/250ML-% IV SOLN
INTRAVENOUS | Status: DC | PRN
  Administered 2022-10-27: 20 ug/min via INTRAVENOUS
  Administered 2022-10-27: 25 ug/min via INTRAVENOUS

## 2022-10-27 MED ORDER — OXYCODONE HCL 5 MG PO TABS: 10.0000 mg | ORAL_TABLET | ORAL | Status: DC | PRN

## 2022-10-27 MED ORDER — LIDOCAINE 2% (20 MG/ML) 5 ML SYRINGE
INTRAMUSCULAR | Status: DC | PRN
  Administered 2022-10-27: 40 mg via INTRAVENOUS

## 2022-10-27 MED ORDER — 0.9 % SODIUM CHLORIDE (POUR BTL) OPTIME
TOPICAL | Status: DC | PRN
  Administered 2022-10-27: 1000 mL

## 2022-10-27 MED ORDER — HYDROMORPHONE HCL 1 MG/ML IJ SOLN
0.2500 mg | INTRAMUSCULAR | Status: DC | PRN
  Administered 2022-10-27: 0.5 mg via INTRAVENOUS
  Administered 2022-10-27: 0.25 mg via INTRAVENOUS
  Administered 2022-10-27: 0.5 mg via INTRAVENOUS
  Administered 2022-10-27: 0.25 mg via INTRAVENOUS
  Administered 2022-10-27: 0.5 mg via INTRAVENOUS

## 2022-10-27 MED ORDER — TRANEXAMIC ACID-NACL 1000-0.7 MG/100ML-% IV SOLN
1000.0000 mg | INTRAVENOUS | Status: AC
  Administered 2022-10-27: 1000 mg via INTRAVENOUS

## 2022-10-27 MED ORDER — HYDROMORPHONE HCL 1 MG/ML IJ SOLN
INTRAMUSCULAR | Status: AC
  Filled 2022-10-27: qty 1

## 2022-10-27 MED ORDER — METHOCARBAMOL 500 MG PO TABS
ORAL_TABLET | ORAL | Status: AC
  Filled 2022-10-27: qty 1

## 2022-10-27 MED ORDER — OXYCODONE HCL 5 MG PO TABS
5.0000 mg | ORAL_TABLET | ORAL | Status: DC | PRN
  Administered 2022-10-27: 5 mg via ORAL
  Administered 2022-10-27 – 2022-10-28 (×5): 10 mg via ORAL
  Administered 2022-10-28: 5 mg via ORAL
  Administered 2022-10-28: 10 mg via ORAL
  Administered 2022-10-28: 5 mg via ORAL
  Administered 2022-10-29 (×4): 10 mg via ORAL
  Filled 2022-10-27 (×6): qty 2
  Filled 2022-10-27: qty 1
  Filled 2022-10-27 (×3): qty 2
  Filled 2022-10-27: qty 1
  Filled 2022-10-27: qty 2

## 2022-10-27 MED ORDER — VANCOMYCIN HCL 1000 MG IV SOLR
INTRAVENOUS | Status: AC
  Filled 2022-10-27: qty 20

## 2022-10-27 MED ORDER — PROPOFOL 1000 MG/100ML IV EMUL
INTRAVENOUS | Status: AC
  Filled 2022-10-27: qty 100

## 2022-10-27 MED ORDER — APIXABAN 2.5 MG PO TABS
2.5000 mg | ORAL_TABLET | Freq: Two times a day (BID) | ORAL | Status: DC
  Administered 2022-10-28 – 2022-10-29 (×3): 2.5 mg via ORAL
  Filled 2022-10-27 (×4): qty 1

## 2022-10-27 MED ORDER — METOCLOPRAMIDE HCL 5 MG/ML IJ SOLN: 5.0000 mg | Freq: Three times a day (TID) | INTRAMUSCULAR | Status: DC | PRN

## 2022-10-27 MED ORDER — CEFAZOLIN SODIUM-DEXTROSE 2-4 GM/100ML-% IV SOLN
2.0000 g | INTRAVENOUS | Status: AC
  Administered 2022-10-27: 2 g via INTRAVENOUS

## 2022-10-27 MED ORDER — SODIUM CHLORIDE 0.9 % IR SOLN
Status: DC | PRN
  Administered 2022-10-27: 3000 mL

## 2022-10-27 MED ORDER — ROPIVACAINE HCL 5 MG/ML IJ SOLN
INTRAMUSCULAR | Status: DC | PRN
  Administered 2022-10-27: 20 mL via PERINEURAL

## 2022-10-27 MED ORDER — ALUM & MAG HYDROXIDE-SIMETH 200-200-20 MG/5ML PO SUSP: 30.0000 mL | ORAL | Status: DC | PRN

## 2022-10-27 MED ORDER — TAMSULOSIN HCL 0.4 MG PO CAPS
0.4000 mg | ORAL_CAPSULE | Freq: Every day | ORAL | Status: DC
  Administered 2022-10-27 – 2022-10-28 (×2): 0.4 mg via ORAL
  Filled 2022-10-27 (×2): qty 1

## 2022-10-27 MED ORDER — EPHEDRINE SULFATE-NACL 50-0.9 MG/10ML-% IV SOSY
PREFILLED_SYRINGE | INTRAVENOUS | Status: DC | PRN
  Administered 2022-10-27 (×2): 5 mg via INTRAVENOUS

## 2022-10-27 MED ORDER — ONDANSETRON HCL 4 MG PO TABS: 4.0000 mg | ORAL_TABLET | Freq: Four times a day (QID) | ORAL | Status: DC | PRN

## 2022-10-27 MED ORDER — OXYCODONE HCL 5 MG/5ML PO SOLN: 5.0000 mg | Freq: Once | ORAL | Status: DC | PRN

## 2022-10-27 MED ORDER — DEXAMETHASONE SODIUM PHOSPHATE 10 MG/ML IJ SOLN
INTRAMUSCULAR | Status: DC | PRN
  Administered 2022-10-27: 5 mg via INTRAVENOUS

## 2022-10-27 MED ORDER — ONDANSETRON HCL 4 MG/2ML IJ SOLN
INTRAMUSCULAR | Status: DC | PRN
  Administered 2022-10-27: 4 mg via INTRAVENOUS

## 2022-10-27 MED ORDER — ORAL CARE MOUTH RINSE: 15.0000 mL | Freq: Once | OROMUCOSAL | Status: AC

## 2022-10-27 MED ORDER — METHOCARBAMOL 1000 MG/10ML IJ SOLN: 500.0000 mg | Freq: Four times a day (QID) | INTRAVENOUS | Status: DC | PRN

## 2022-10-27 MED ORDER — OXYCODONE HCL 5 MG PO TABS
ORAL_TABLET | ORAL | Status: AC
  Filled 2022-10-27: qty 1

## 2022-10-27 MED ORDER — BUPIVACAINE-EPINEPHRINE (PF) 0.5% -1:200000 IJ SOLN
INTRAMUSCULAR | Status: AC
  Filled 2022-10-27: qty 30

## 2022-10-27 MED ORDER — METOCLOPRAMIDE HCL 5 MG PO TABS: 5.0000 mg | ORAL_TABLET | Freq: Three times a day (TID) | ORAL | Status: DC | PRN

## 2022-10-27 SURGICAL SUPPLY — 76 items
BAG COUNTER SPONGE SURGICOUNT (BAG) ×1 IMPLANT
BAG SPNG CNTER NS LX DISP (BAG) ×1
BANDAGE ESMARK 6X9 LF (GAUZE/BANDAGES/DRESSINGS) ×1 IMPLANT
BLADE SAG 18X100X1.27 (BLADE) ×1 IMPLANT
BNDG CMPR 9X6 STRL LF SNTH (GAUZE/BANDAGES/DRESSINGS) ×1
BNDG CMPR MED 10X6 ELC LF (GAUZE/BANDAGES/DRESSINGS) ×1
BNDG ELASTIC 6X10 VLCR STRL LF (GAUZE/BANDAGES/DRESSINGS) IMPLANT
BNDG ELASTIC 6X5.8 VLCR STR LF (GAUZE/BANDAGES/DRESSINGS) ×2 IMPLANT
BNDG ESMARK 6X9 LF (GAUZE/BANDAGES/DRESSINGS) ×1
BOWL SMART MIX CTS (DISPOSABLE) IMPLANT
CEMENT BONE R 1X40 (Cement) IMPLANT
COMP FEM CMT STD PS SZ12 LT (Joint) ×1 IMPLANT
COMP TIB KNEE PS G 0D LT (Joint) ×1 IMPLANT
COMPONENT FEM CMT STD  SZ12LT (Joint) IMPLANT
COMPONENT TIB KNEE PS G 0D LT (Joint) IMPLANT
COOLER ICEMAN CLASSIC (MISCELLANEOUS) ×1 IMPLANT
COVER SURGICAL LIGHT HANDLE (MISCELLANEOUS) ×1 IMPLANT
CUFF TOURN SGL QUICK 34 (TOURNIQUET CUFF) ×1
CUFF TOURN SGL QUICK 42 (TOURNIQUET CUFF) IMPLANT
CUFF TRNQT CYL 34X4.125X (TOURNIQUET CUFF) ×1 IMPLANT
DRAPE EXTREMITY T 121X128X90 (DISPOSABLE) ×1 IMPLANT
DRAPE HALF SHEET 40X57 (DRAPES) ×1 IMPLANT
DRAPE U-SHAPE 47X51 STRL (DRAPES) ×1 IMPLANT
DRSG XEROFORM 1X8 (GAUZE/BANDAGES/DRESSINGS) IMPLANT
DURAPREP 26ML APPLICATOR (WOUND CARE) ×1 IMPLANT
ELECT CAUTERY BLADE 6.4 (BLADE) ×1 IMPLANT
ELECT REM PT RETURN 9FT ADLT (ELECTROSURGICAL) ×1
ELECTRODE REM PT RTRN 9FT ADLT (ELECTROSURGICAL) ×1 IMPLANT
FACESHIELD WRAPAROUND (MASK) ×2 IMPLANT
FACESHIELD WRAPAROUND OR TEAM (MASK) ×2 IMPLANT
GAUZE PAD ABD 8X10 STRL (GAUZE/BANDAGES/DRESSINGS) ×1 IMPLANT
GAUZE SPONGE 4X4 12PLY STRL (GAUZE/BANDAGES/DRESSINGS) ×1 IMPLANT
GAUZE SPONGE 4X4 12PLY STRL LF (GAUZE/BANDAGES/DRESSINGS) IMPLANT
GAUZE XEROFORM 1X8 LF (GAUZE/BANDAGES/DRESSINGS) ×1 IMPLANT
GLOVE BIOGEL PI IND STRL 8 (GLOVE) ×2 IMPLANT
GLOVE ORTHO TXT STRL SZ7.5 (GLOVE) ×1 IMPLANT
GLOVE SURG ORTHO 8.0 STRL STRW (GLOVE) ×1 IMPLANT
GOWN STRL REUS W/ TWL LRG LVL3 (GOWN DISPOSABLE) IMPLANT
GOWN STRL REUS W/ TWL XL LVL3 (GOWN DISPOSABLE) ×2 IMPLANT
GOWN STRL REUS W/TWL LRG LVL3 (GOWN DISPOSABLE)
GOWN STRL REUS W/TWL XL LVL3 (GOWN DISPOSABLE) ×2
HANDPIECE INTERPULSE COAX TIP (DISPOSABLE) ×1
HDLS TROCR DRIL PIN KNEE 75 (PIN) ×1
IMMOBILIZER KNEE 22 UNIV (SOFTGOODS) ×1 IMPLANT
IV NS 1000ML (IV SOLUTION) ×1
IV NS 1000ML BAXH (IV SOLUTION) ×1 IMPLANT
KIT BASIN OR (CUSTOM PROCEDURE TRAY) ×1 IMPLANT
KIT TURNOVER KIT B (KITS) ×1 IMPLANT
LINER TIB ASF PS GH/12 LT (Liner) IMPLANT
MANIFOLD NEPTUNE II (INSTRUMENTS) ×1 IMPLANT
NDL 18GX1X1/2 (RX/OR ONLY) (NEEDLE) IMPLANT
NEEDLE 18GX1X1/2 (RX/OR ONLY) (NEEDLE) IMPLANT
NS IRRIG 1000ML POUR BTL (IV SOLUTION) ×1 IMPLANT
PACK TOTAL JOINT (CUSTOM PROCEDURE TRAY) ×1 IMPLANT
PAD ARMBOARD 7.5X6 YLW CONV (MISCELLANEOUS) ×1 IMPLANT
PAD COLD SHLDR WRAP-ON (PAD) ×1 IMPLANT
PADDING CAST ABS COTTON 6X4 NS (CAST SUPPLIES) IMPLANT
PADDING CAST COTTON 6X4 STRL (CAST SUPPLIES) ×1 IMPLANT
PIN DRILL HDLS TROCAR 75 4PK (PIN) IMPLANT
SCREW FEMALE HEX FIX 25X2.5 (ORTHOPEDIC DISPOSABLE SUPPLIES) IMPLANT
SET HNDPC FAN SPRY TIP SCT (DISPOSABLE) ×1 IMPLANT
SET PAD KNEE POSITIONER (MISCELLANEOUS) ×1 IMPLANT
STAPLER VISISTAT 35W (STAPLE) ×1 IMPLANT
STEM POLY PAT PLY 35M KNEE (Knees) IMPLANT
SUCTION FRAZIER HANDLE 10FR (MISCELLANEOUS) ×1
SUCTION TUBE FRAZIER 10FR DISP (MISCELLANEOUS) ×1 IMPLANT
SUT VIC AB 0 CT1 27 (SUTURE) ×1
SUT VIC AB 0 CT1 27XBRD ANBCTR (SUTURE) ×1 IMPLANT
SUT VIC AB 1 CT1 27 (SUTURE) ×2
SUT VIC AB 1 CT1 27XBRD ANBCTR (SUTURE) ×2 IMPLANT
SUT VIC AB 2-0 CT1 27 (SUTURE) ×3
SUT VIC AB 2-0 CT1 TAPERPNT 27 (SUTURE) ×2 IMPLANT
SYR 50ML LL SCALE MARK (SYRINGE) IMPLANT
TOWEL GREEN STERILE (TOWEL DISPOSABLE) ×1 IMPLANT
TOWEL GREEN STERILE FF (TOWEL DISPOSABLE) ×1 IMPLANT
TRAY CATH INTERMITTENT SS 16FR (CATHETERS) IMPLANT

## 2022-10-27 NOTE — Interval H&P Note (Signed)
History and Physical Interval Note: The patient understands that he is here today for a left total knee arthroplasty to treat his severe left knee arthritis.  There has been no acute or interval change in his medical status.  See H&P.  The risks and benefits of surgery have been discussed in detail and informed consent is obtained.  The left operative knee has been marked.  10/27/2022 7:26 AM  Para Skeans  has presented today for surgery, with the diagnosis of OSTEOARTHRITIS LEFT KNEE.  The various methods of treatment have been discussed with the patient and family. After consideration of risks, benefits and other options for treatment, the patient has consented to  Procedure(s): LEFT TOTAL KNEE ARTHROPLASTY (Left) as a surgical intervention.  The patient's history has been reviewed, patient examined, no change in status, stable for surgery.  I have reviewed the patient's chart and labs.  Questions were answered to the patient's satisfaction.     Lucas Shepherd

## 2022-10-27 NOTE — Transfer of Care (Signed)
Immediate Anesthesia Transfer of Care Note  Patient: Lucas Shepherd  Procedure(s) Performed: LEFT TOTAL KNEE ARTHROPLASTY (Left: Knee)  Patient Location: PACU  Anesthesia Type:GA combined with regional for post-op pain  Level of Consciousness: awake, alert , and oriented  Airway & Oxygen Therapy: Patient Spontanous Breathing  Post-op Assessment: Report given to RN and Post -op Vital signs reviewed and stable  Post vital signs: Reviewed and stable  Last Vitals:  Vitals Value Taken Time  BP 147/66 10/27/22 0932  Temp 98   Pulse 78 10/27/22 0935  Resp 20 10/27/22 0935  SpO2 94 % 10/27/22 0935  Vitals shown include unvalidated device data.  Last Pain:  Vitals:   10/27/22 0624  TempSrc:   PainSc: 0-No pain         Complications: No notable events documented.

## 2022-10-27 NOTE — Anesthesia Postprocedure Evaluation (Signed)
Anesthesia Post Note  Patient: Lucas Shepherd  Procedure(s) Performed: LEFT TOTAL KNEE ARTHROPLASTY (Left: Knee)     Patient location during evaluation: PACU Anesthesia Type: Regional and General Level of consciousness: awake and alert Pain management: pain level controlled Vital Signs Assessment: post-procedure vital signs reviewed and stable Respiratory status: spontaneous breathing, nonlabored ventilation and respiratory function stable Cardiovascular status: blood pressure returned to baseline and stable Postop Assessment: no apparent nausea or vomiting Anesthetic complications: no   No notable events documented.  Last Vitals:  Vitals:   10/27/22 1045 10/27/22 1100  BP: 127/77 (!) 143/77  Pulse: 60 (!) 58  Resp: 16 16  Temp: (!) 36.4 C   SpO2: 96% 100%    Last Pain:  Vitals:   10/27/22 1030  TempSrc:   PainSc: 4                  Lowella Curb

## 2022-10-27 NOTE — Evaluation (Signed)
Physical Therapy Evaluation Patient Details Name: Lucas Shepherd MRN: [id number] DOB: 02-07-1929 Today's Date: 10/27/2022  History of Present Illness  89 y.o. male presents to [facility] hospital on 10/27/2022 for elective L TKA. PMH includes low back pain, syncope, hiatal hernia, BPH, GERD, GIB.  Clinical Impression  Pt presents to PT with deficits in ROM, gait, balance, strength, power, endurance. Pt is limited by LLE weakness, appropriate for POD 0 s/p TKA. Pt is able to ambulate for short household distances with support of RW. PT provides education on TKA exercise packet. PT will follow up in the morning for further mobility and stair training.        Recommendations for follow up therapy are one component of a multi-disciplinary discharge planning process, led by the attending physician.  Recommendations may be updated based on patient status, additional functional criteria and insurance authorization.  Follow Up Recommendations       Assistance Recommended at Discharge PRN  Patient can return home with the following  A little help with bathing/dressing/bathroom;Assistance with cooking/housework;Assist for transportation;Help with stairs or ramp for entrance    Equipment Recommendations None recommended by PT  Recommendations for Other Services       Functional Status Assessment Patient has had a recent decline in their functional status and demonstrates the ability to make significant improvements in function in a reasonable and predictable amount of time.     Precautions / Restrictions Precautions Precautions: Knee Precaution Booklet Issued: Yes (comment) Restrictions Weight Bearing Restrictions: Yes LLE Weight Bearing: Weight bearing as tolerated      Mobility  Bed Mobility Overal bed mobility: Needs Assistance Bed Mobility: Supine to Sit     Supine to sit: Supervision, HOB elevated          Transfers Overall transfer level: Needs assistance Equipment used:  Rolling walker (2 wheels) Transfers: Sit to/from Stand Sit to Stand: Min guard, From elevated surface                Ambulation/Gait Ambulation/Gait assistance: Min guard Gait Distance (Feet): 70 Feet Assistive device: Rolling walker (2 wheels) Gait Pattern/deviations: Step-through pattern, Knee flexed in stance - left Gait velocity: reduced Gait velocity interpretation: <1.31 ft/sec, indicative of household ambulator   General Gait Details: slowed step-through gait, reduced stride length and increased L knee flexion during stance phase  Stairs            Wheelchair Mobility    Modified Rankin (Stroke Patients Only)       Balance Overall balance assessment: Needs assistance Sitting-balance support: No upper extremity supported, Feet supported Sitting balance-Leahy Scale: Good     Standing balance support: Single extremity supported, Bilateral upper extremity supported, Reliant on assistive device for balance Standing balance-Leahy Scale: Poor                               Pertinent Vitals/Pain Pain Assessment Pain Assessment: 0-10 Pain Score: 3  Pain Location: L knee Pain Descriptors / Indicators: Aching Pain Intervention(s): Monitored during session    Home Living Family/patient expects to be discharged to:: Private residence Living Arrangements: Spouse/significant other Available Help at Discharge: Family;Available 24 hours/day Type of Home: House Home Access: Stairs to enter Entrance Stairs-Rails: Left Entrance Stairs-Number of Steps: 2   Home Layout: Multi-level;Able to live on main level with bedroom/bathroom Home Equipment: Rolling Walker (2 wheels);Cane - single point;Shower seat - built in      Prior Function  Prior Level of Function : Independent/Modified Independent;Driving             Mobility Comments: ambulating with SPC       Hand Dominance        Extremity/Trunk Assessment   Upper Extremity Assessment Upper  Extremity Assessment: Overall WFL for tasks assessed    Lower Extremity Assessment Lower Extremity Assessment: LLE deficits/detail LLE Deficits / Details: post-op knee ROM deficits as expected, ankle ROM WFL, pt is able to perform SLR    Cervical / Trunk Assessment Cervical / Trunk Assessment: Normal  Communication   Communication: No difficulties  Cognition Arousal/Alertness: Awake/alert Behavior During Therapy: WFL for tasks assessed/performed Overall Cognitive Status: Within Functional Limits for tasks assessed                                          General Comments General comments (skin integrity, edema, etc.): VSS on RA    Exercises Other Exercises Other Exercises: PT provides TKR exercise packet, reviews exercises with patient   Assessment/Plan    PT Assessment Patient needs continued PT services  PT Problem List Decreased strength;Decreased activity tolerance;Decreased range of motion;Decreased mobility;Decreased balance;Decreased knowledge of use of DME;Pain       PT Treatment Interventions DME instruction;Gait training;Stair training;Functional mobility training;Therapeutic activities;Therapeutic exercise;Balance training;Neuromuscular re-education;Patient/family education    PT Goals (Current goals can be found in the Care Plan section)  Acute Rehab PT Goals Patient Stated Goal: to return to independence PT Goal Formulation: With patient Time For Goal Achievement: 10/31/22 Potential to Achieve Goals: Good    Frequency 7X/week     Co-evaluation               AM-PAC PT "6 Clicks" Mobility  Outcome Measure Help needed turning from your back to your side while in a flat bed without using bedrails?: A Little Help needed moving from lying on your back to sitting on the side of a flat bed without using bedrails?: A Little Help needed moving to and from a bed to a chair (including a wheelchair)?: A Little Help needed standing up from a  chair using your arms (e.g., wheelchair or bedside chair)?: A Little Help needed to walk in hospital room?: A Little Help needed climbing 3-5 steps with a railing? : A Lot 6 Click Score: 17    End of Session Equipment Utilized During Treatment: Gait belt Activity Tolerance: Patient tolerated treatment well Patient left: in chair;with call bell/phone within reach;with family/visitor present Nurse Communication: Mobility status PT Visit Diagnosis: Other abnormalities of gait and mobility (R26.89);Muscle weakness (generalized) (M62.81);Pain Pain - Right/Left: Left Pain - part of body: Knee    Time: 1315-1340 PT Time Calculation (min) (ACUTE ONLY): 25 min   Charges:   PT Evaluation $PT Eval Low Complexity: 1 Low          Arlyss Gandy, PT, DPT Acute Rehabilitation Office [phone]   Arlyss Gandy 10/27/2022, 3:24 PM

## 2022-10-27 NOTE — Op Note (Signed)
Operative Note  Date of operation: 10/27/2022 Preoperative diagnosis: Left knee primary osteoarthritis Postoperative diagnosis: Same  Procedure: Left cemented total knee arthroplasty  Implants: Biomet/Zimmer persona knee system Implant Name Type Inv. Item Serial No. Manufacturer Lot No. LRB No. Used Action  CEMENT BONE R 1X40 - [id number] Cement CEMENT BONE R 1X40  ZIMMER RECON(ORTH,TRAU,BIO,SG) [id number] Left 1 Implanted  CEMENT BONE R 1X40 - [id number] Cement CEMENT BONE R 1X40  ZIMMER RECON(ORTH,TRAU,BIO,SG) [id number] Left 1 Implanted  LINER TIB ASF PS GH/12 LT - [id number] Liner LINER TIB ASF PS GH/12 LT  ZIMMER RECON(ORTH,TRAU,BIO,SG) [id number] Left 1 Implanted  persona 0 degree cemented tibia    ZIMMER [id number] Left 1 Implanted  COMP FEM CMT STD PS SZ12 LT - [id number] Joint COMP FEM CMT STD PS SZ12 LT  ZIMMER RECON(ORTH,TRAU,BIO,SG) [id number] Left 1 Implanted  STEM POLY PAT PLY 52M KNEE - [id number] Knees STEM POLY PAT PLY 52M KNEE  ZIMMER RECON(ORTH,TRAU,BIO,SG) [id number] Left 1 Implanted   Surgeon: Vanita Panda. Magnus Ivan, MD Assistant: Rexene Edison, PA-C  Anesthesia: #1 left lower extremity adductor canal block, #2 General, #3 local Tourniquet time: Less than 70 minutes EBL: Less than 100 cc Antibiotics: IV Ancef Complications: None  Indications: The patient is a 89 year old gentleman with severe debilitating arthritis involving his left knee this been well-documented with clinical exam and x-ray findings.  He has tried and failed conservative treatment for over a year now.  At this point his left knee pain is daily and it is detrimentally affecting his mobility, his quality of life and his actives day living.  He wishes to proceed with a knee replacement.  He has discussed this with his family as well as his cardiologist and primary care physician he has been cleared for surgery.  I agree with proceeding with the surgery based on his exam and x-ray findings combined with the  failure conservative treatment.  We did talk in length in detail the risk of acute blood loss anemia, nerve vessel injury, fracture, infection, DVT, implant failure and wound healing issues.  We talked about her goals being hopefully decrease pain, improve mobility, and improve quality of life.  Procedure description: After informed consent was obtained and the appropriate left knee was marked, anesthesia obtained a left lower extremity adductor canal block in the holding room.  The patient was then brought to the operating room and placed supine on the operating table.  General anesthesia was obtained.  A nonsterile tourniquet was placed around his upper left thigh and his left thigh, leg, knee, ankle and foot were prepped and draped with DuraPrep and sterile drapes.  A timeout was called and he was identified as the correct patient and the correct left knee.  An Esmarch was then used to wrap out the leg and the tourniquet was flighted to 300 mm of pressure.  With the knee extended a direct midline incision was made over the patella and carried proximally and distally.  Dissection was carried down to the knee joint and a medial parapatellar arthrotomy was made.  There was a moderate joint effusion encountered.  With the knee in a flexed position we remove remnants of the medial lateral meniscus.  We found complete cartilage loss with slight valgus malalignment.  This was mainly on the lateral compartment of the knee and the patellofemoral joint.  Osteophytes were removed from all 3 compartments as well.  With the knee in a flexed position we then used an extramedullary cutting guide for making her proximal tibia  cut correction for varus and valgus and a 7 degree slope.  We made this cut to take 2 mm off the low side.  We made that cut without difficulty but seeing that he had a slight flexion contracture we backed it down to more millimeters.  We then used a intramedullary cutting guide for femur setting this for  a left knee at 5 degrees externally rotated and a 10 mm distal femoral cut.  We made that cut without difficulty and then back down down to more millimeters well.  We then achieved full extension with a 10 mm extension block.  We then went back to the femur and put a femoral sizing guide based off the epicondylar axis.  Based off of this we chose a size 12 femur.  We put a 4-in-1 cutting block for a size 12 femur and made her anterior posterior cuts followed our chamfer cuts.  We then back to the tibia and chose a size G left tibial tray for coverage over the tibial plateau setting the rotation off of the tibial tubercle and the femur.  We did our drill hole and keel punch off of this.  We then trialed our size G left tibia followed by our size 12 left CR standard femur.  We placed a 10 mm medial congruent left polythene insert and did go up to 12 mm insert and that offered [place] more rotational stability as well.  We had good range of motion without implant as well.  We then made a patella cut and drilled 3 holes for a size 35 patella button.  Again with all trial instrumentation the knee up with him through several cycles of motion.  We then removed all trial instrumentation from the knee and irrigate the knee with normal saline solution using pulsatile lavage.  We placed our Marcaine with epinephrine around the arthrotomy.  The cement was then mixed in with the knee in a flexed position we cemented our Biomet Zimmer left tibial tray size G followed by our size 12 left CR standard femur.  We removed cement debris from the knee and placed our 12 mm left medial congruent polythene insert and cemented our size 35 patella button.  We then held the knee fully extended and compressed while the cement hardened.  Once it hardened with the tourniquet down and hemostasis was obtained with electrocautery.  The arthrotomy was then closed with interrupted #1 Vicryl suture followed by 0 Vicryl close the deep tissue and 2-0 Vicryl  close subcutaneous tissue.  The skin was closed with staples.  Well-padded sterile dressing was applied.  The patient was awakened, extubated and taken recovery room in stable addition.  Rexene Edison, PA-C did assist during the entire case from beginning to end and his assistance was medically necessary and crucial for soft tissue management and retraction, helping guide implant placement and a layered closure of the wound.

## 2022-10-27 NOTE — Anesthesia Procedure Notes (Signed)
Anesthesia Regional Block: Adductor canal block   Pre-Anesthetic Checklist: , timeout performed,  Correct Patient, Correct Site, Correct Laterality,  Correct Procedure, Correct Position, site marked,  Risks and benefits discussed,  Surgical consent,  Pre-op evaluation,  At surgeon's request and post-op pain management  Laterality: Left  Prep: chloraprep       Needles:  Injection technique: Single-shot  Needle Type: Stimiplex     Needle Length: 9cm  Needle Gauge: 21     Additional Needles:   Procedures:,,,, ultrasound used (permanent image in chart),,    Narrative:  Start time: 10/27/2022 7:18 AM End time: 10/27/2022 7:23 AM Injection made incrementally with aspirations every 5 mL.  Performed by: Personally  Anesthesiologist: Lowella Curb, MD

## 2022-10-27 NOTE — Anesthesia Procedure Notes (Addendum)
Procedure Name: LMA Insertion Date/Time: 10/27/2022 7:36 AM  Performed by: Darryl Nestle, CRNAPre-anesthesia Checklist: Patient identified, Emergency Drugs available, Suction available and Patient being monitored Patient Re-evaluated:Patient Re-evaluated prior to induction Oxygen Delivery Method: Circle system utilized Preoxygenation: Pre-oxygenation with 100% oxygen Induction Type: IV induction Ventilation: Mask ventilation without difficulty LMA: LMA inserted LMA Size: 4.0 Placement Confirmation: positive ETCO2 and breath sounds checked- equal and bilateral Tube secured with: Tape Dental Injury: Teeth and Oropharynx as per pre-operative assessment

## 2022-10-27 NOTE — Anesthesia Preprocedure Evaluation (Addendum)
Anesthesia Evaluation  Patient identified by MRN, date of birth, ID band Patient awake    Reviewed: Allergy & Precautions, H&P , NPO status , Patient's Chart, lab work & pertinent test results, reviewed documented beta blocker date and time   Airway Mallampati: II  TM Distance: >3 FB Neck ROM: full    Dental no notable dental hx.    Pulmonary former smoker   breath sounds clear to auscultation       Cardiovascular negative cardio ROS  Rhythm:regular     Neuro/Psych  negative psych ROS   GI/Hepatic Neg liver ROS,GERD  ,,  Endo/Other  negative endocrine ROS    Renal/GU   negative genitourinary   Musculoskeletal  (+) Arthritis , Osteoarthritis,    Abdominal   Peds  Hematology negative hematology ROS (+)   Anesthesia Other Findings See surgeon's H&P   Reproductive/Obstetrics negative OB ROS                             Anesthesia Physical Anesthesia Plan  ASA: 3  Anesthesia Plan: General and Regional   Post-op Pain Management: Regional block*   Induction: Intravenous  PONV Risk Score and Plan: 2 and Ondansetron, Treatment may vary due to age or medical condition and Midazolam  Airway Management Planned: LMA  Additional Equipment:   Intra-op Plan:   Post-operative Plan: Extubation in OR  Informed Consent: I have reviewed the patients History and Physical, chart, labs and discussed the procedure including the risks, benefits and alternatives for the proposed anesthesia with the patient or authorized representative who has indicated his/her understanding and acceptance.     Dental Advisory Given  Plan Discussed with: CRNA and Surgeon  Anesthesia Plan Comments:         Anesthesia Quick Evaluation

## 2022-10-28 ENCOUNTER — Encounter (HOSPITAL_COMMUNITY): Payer: Self-pay | Admitting: Orthopaedic Surgery

## 2022-10-28 DIAGNOSIS — Z96611 Presence of right artificial shoulder joint: Secondary | ICD-10-CM | POA: Diagnosis not present

## 2022-10-28 DIAGNOSIS — Z87891 Personal history of nicotine dependence: Secondary | ICD-10-CM | POA: Diagnosis not present

## 2022-10-28 DIAGNOSIS — M1712 Unilateral primary osteoarthritis, left knee: Secondary | ICD-10-CM | POA: Diagnosis not present

## 2022-10-28 LAB — CBC
HCT: 33.4 % — ABNORMAL LOW (ref 39.0–52.0)
Hemoglobin: 11.1 g/dL — ABNORMAL LOW (ref 13.0–17.0)
MCH: 31.9 pg (ref 26.0–34.0)
MCHC: 33.2 g/dL (ref 30.0–36.0)
MCV: 96 fL (ref 80.0–100.0)
Platelets: 172 10*3/uL (ref 150–400)
RBC: 3.48 MIL/uL — ABNORMAL LOW (ref 4.22–5.81)
RDW: 13.4 % (ref 11.5–15.5)
WBC: 7.9 10*3/uL (ref 4.0–10.5)
nRBC: 0 % (ref 0.0–0.2)

## 2022-10-28 LAB — BASIC METABOLIC PANEL
Anion gap: 7 (ref 5–15)
BUN: 20 mg/dL (ref 8–23)
CO2: 23 mmol/L (ref 22–32)
Calcium: 8.7 mg/dL — ABNORMAL LOW (ref 8.9–10.3)
Chloride: 104 mmol/L (ref 98–111)
Creatinine, Ser: 1.13 mg/dL (ref 0.61–1.24)
GFR, Estimated: >60 mL/min (ref >60)
Glucose, Bld: 95 mg/dL (ref 70–99)
Potassium: 4.3 mmol/L (ref 3.5–5.1)
Sodium: 134 mmol/L — ABNORMAL LOW (ref 135–145)

## 2022-10-28 MED ORDER — OXYCODONE HCL 5 MG PO TABS: 5.0000 mg | ORAL_TABLET | Freq: Four times a day (QID) | ORAL | 0 refills | Status: DC | PRN

## 2022-10-28 MED ORDER — APIXABAN 2.5 MG PO TABS: 2.5000 mg | ORAL_TABLET | Freq: Two times a day (BID) | ORAL | 0 refills | Status: DC

## 2022-10-28 MED ORDER — TIZANIDINE HCL 4 MG PO TABS
4.0000 mg | ORAL_TABLET | Freq: Three times a day (TID) | ORAL | Status: DC | PRN
  Administered 2022-10-28 – 2022-10-29 (×2): 4 mg via ORAL
  Filled 2022-10-28 (×2): qty 1

## 2022-10-28 NOTE — Progress Notes (Signed)
Physical Therapy Treatment Patient Details Name: Lucas Shepherd MRN: [id number] DOB: 1929/05/24 Today's Date: 10/28/2022   History of Present Illness 89 y.o. male presents to [facility] hospital on 10/27/2022 for elective L TKA. PMH includes low back pain, syncope, hiatal hernia, BPH, GERD, GIB.    PT Comments    Pt with slow progress in PM session due to significant pain in LLE, despite premedication, pt holding leg and vocalizing in pain throughout all activity/mobility. Pt needing increased assist to come to sitting EOB, up to min A to manage LLE and scoot out to EOB with pt needing rest and cues for breathing techniques between all movements due to increased pain. Pt able to come to stand and step pivot to chair with mod A and RW support. Pt placing very limited weight through LLE, stepping on toes with flexed knee, and needing assist to steady throughout stepping around to chair, gait deferred for safety. Pt able to complete LE exercises with assist for increased ROM and cues for technique. Pt educated on importance of no pillow under knee and positioning for increased extension, pillow placed under calf at end of session and ice applied. Pt continues to benefit from skilled PT services to progress toward functional mobility goals.    Recommendations for follow up therapy are one component of a multi-disciplinary discharge planning process, led by the attending physician.  Recommendations may be updated based on patient status, additional functional criteria and insurance authorization.  Follow Up Recommendations       Assistance Recommended at Discharge PRN  Patient can return home with the following A little help with bathing/dressing/bathroom;Assistance with cooking/housework;Assist for transportation;Help with stairs or ramp for entrance   Equipment Recommendations  None recommended by PT    Recommendations for Other Services       Precautions / Restrictions  Precautions Precautions: Knee Restrictions Weight Bearing Restrictions: Yes LLE Weight Bearing: Weight bearing as tolerated     Mobility  Bed Mobility Overal bed mobility: Needs Assistance Bed Mobility: Supine to Sit     Supine to sit: Min assist    General bed mobility comments: min A to manage LLE and scoot out to EOB    Transfers Overall transfer level: Needs assistance Equipment used: Rolling walker (2 wheels) Transfers: Sit to/from Stand, Bed to chair/wheelchair/BSC Sit to Stand: From elevated surface, Mod assist   Step pivot transfers: Min assist       General transfer comment: mod A to power up from eleavted EOB    Ambulation/Gait Ambulation/Gait assistance: Min guard, Min assist Gait Distance (Feet): 90 Feet Assistive device: Rolling walker (2 wheels) Gait Pattern/deviations: Step-through pattern, Knee flexed in stance - left Gait velocity: reduced     General Gait Details: unable to progress this PM due to pain   Stairs Stairs:  (unable to progress this session due to UE fatigue and L knee pain, will attempt in PM session)           Wheelchair Mobility    Modified Rankin (Stroke Patients Only)       Balance Overall balance assessment: Needs assistance Sitting-balance support: No upper extremity supported, Feet supported Sitting balance-Leahy Scale: Good     Standing balance support: Single extremity supported, Bilateral upper extremity supported, Reliant on assistive device for balance Standing balance-Leahy Scale: Poor Standing balance comment: heavy reliance on UE support  Cognition Arousal/Alertness: Awake/alert Behavior During Therapy: WFL for tasks assessed/performed Overall Cognitive Status: Within Functional Limits for tasks assessed                                          Exercises Total Joint Exercises Ankle Circles/Pumps: AROM, Left, 10 reps Quad Sets: AAROM, Left,  5 reps Knee Flexion: AAROM, Left, 10 reps    General Comments General comments (skin integrity, edema, etc.): VSS on RA, RN giving pain meds prior to session, heel eleavted on rolled pillow at end of session to encourage extension      Pertinent Vitals/Pain Pain Assessment Pain Assessment: Faces Pain Score: 9  Faces Pain Scale: Hurts whole lot Pain Location: L knee Pain Descriptors / Indicators: Aching, Sore, Squeezing Pain Intervention(s): Monitored during session, Premedicated before session, Limited activity within patient's tolerance    Home Living                          Prior Function            PT Goals (current goals can now be found in the care plan section) Acute Rehab PT Goals Patient Stated Goal: to return to independence PT Goal Formulation: With patient Time For Goal Achievement: 10/31/22 Progress towards PT goals: Not progressing toward goals - comment (pain)    Frequency    7X/week      PT Plan      Co-evaluation              AM-PAC PT "6 Clicks" Mobility   Outcome Measure  Help needed turning from your back to your side while in a flat bed without using bedrails?: A Little Help needed moving from lying on your back to sitting on the side of a flat bed without using bedrails?: A Little Help needed moving to and from a bed to a chair (including a wheelchair)?: A Little Help needed standing up from a chair using your arms (e.g., wheelchair or bedside chair)?: A Lot Help needed to walk in hospital room?: A Lot Help needed climbing 3-5 steps with a railing? : A Lot 6 Click Score: 15    End of Session Equipment Utilized During Treatment: Gait belt Activity Tolerance: Patient limited by pain Patient left: with call bell/phone within reach;in chair;Other (comment) (with pillow undercalf) Nurse Communication: Mobility status PT Visit Diagnosis: Other abnormalities of gait and mobility (R26.89);Muscle weakness (generalized)  (M62.81);Pain Pain - Right/Left: Left Pain - part of body: Knee     Time: 1322-1350 PT Time Calculation (min) (ACUTE ONLY): 28 min  Charges:  $Therapeutic Exercise: 8-22 mins $Therapeutic Activity: 8-22 mins                     Rhylin Venters R. PTA Acute Rehabilitation Services Office: [phone]    Catalina Antigua 10/28/2022, 2:44 PM

## 2022-10-28 NOTE — Discharge Summary (Signed)
Patient ID: Lucas Shepherd MRN: [id number] DOB/AGE: 08-01-1928 89 y.o.  Admit date: 10/27/2022 Discharge date: 10/28/2022  Admission Diagnoses:  Principal Problem:   Unilateral primary osteoarthritis, left knee Active Problems:   Status post total left knee replacement   Discharge Diagnoses:  Same  Past Medical History:  Diagnosis Date   Anemia    related to past gastric ulcers   Arthritis    Blood transfusion    BPH (benign prostatic hyperplasia)    External hemorrhoids    GERD (gastroesophageal reflux disease)    nisseum fundoplication 2010   GI bleed 06/2011   got 3 units blood   Hiatal hernia    History of kidney stones    History of TMJ syndrome    mild   History of urinary frequency    Osteoporosis    Urinary urgency     Surgeries: Procedure(s): LEFT TOTAL KNEE ARTHROPLASTY on 10/27/2022   Consultants:   Discharged Condition: Improved  Hospital Course: Lucas Shepherd is an 89 y.o. male who was admitted 10/27/2022 for operative treatment ofUnilateral primary osteoarthritis, left knee. Patient has severe unremitting pain that affects sleep, daily activities, and work/hobbies. After pre-op clearance the patient was taken to the operating room on 10/27/2022 and underwent  Procedure(s): LEFT TOTAL KNEE ARTHROPLASTY.    Patient was given perioperative antibiotics:  Anti-infectives (From admission, onward)    Start     Dose/Rate Route Frequency Ordered Stop   10/27/22 1200  ceFAZolin (ANCEF) IVPB 1 g/50 mL premix        1 g 100 mL/hr over 30 Minutes Intravenous Every 6 hours 10/27/22 1105 10/27/22 1822   10/27/22 0600  ceFAZolin (ANCEF) IVPB 2g/100 mL premix        2 g 200 mL/hr over 30 Minutes Intravenous On call to O.R. 10/27/22 0551 10/27/22 0810   10/27/22 0557  ceFAZolin (ANCEF) 2-4 GM/100ML-% IVPB       Note to Pharmacy: Glenna Fellows: cabinet override      10/27/22 0557 10/27/22 0756        Patient was given sequential compression devices,  early ambulation, and chemoprophylaxis to prevent DVT.  Patient benefited maximally from hospital stay and there were no complications.    Recent vital signs: Patient Vitals for the past 24 hrs:  BP Temp Temp src Pulse Resp SpO2  10/28/22 0723 (!) 98/59 98.3 F (36.8 C) Oral (!) 57 16 98 %  10/28/22 0332 (!) 99/59 98.3 F (36.8 C) Oral (!) 55 18 99 %  10/27/22 2321 129/74 98.5 F (36.9 C) Oral 65 18 100 %  10/27/22 1927 (!) 153/70 98 F (36.7 C) Oral (!) 56 18 100 %  10/27/22 1541 (!) 151/71 -- -- (!) 56 16 99 %  10/27/22 1100 (!) 143/77 -- -- (!) 58 16 100 %  10/27/22 1045 127/77 (!) 97.5 F (36.4 C) -- 60 16 96 %  10/27/22 1030 (!) 147/65 97.6 F (36.4 C) -- 61 11 99 %  10/27/22 1015 (!) 118/94 -- -- 70 19 95 %  10/27/22 1000 (!) 146/68 -- -- 74 17 97 %  10/27/22 0949 (!) 144/79 -- -- 76 18 94 %  10/27/22 0945 (!) 144/79 -- -- 74 16 95 %  10/27/22 0932 (!) 147/66 (!) 97 F (36.1 C) -- 78 11 98 %     Recent laboratory studies:  Recent Labs    10/28/22 0441  WBC 7.9  HGB 11.1*  HCT 33.4*  PLT 172  NA  134*  K 4.3  CL 104  CO2 23  BUN 20  CREATININE 1.13  GLUCOSE 95  CALCIUM 8.7*     Discharge Medications:   Allergies as of 10/28/2022       Reactions   Aspirin Other (See Comments)   History of ulcers   Nsaids Other (See Comments)   History of ulcers        Medication List     TAKE these medications    apixaban 2.5 MG Tabs tablet Commonly known as: ELIQUIS Take 1 tablet (2.5 mg total) by mouth every 12 (twelve) hours.   CALCIUM + D3 PO Take 1 tablet by mouth daily after breakfast.   CITRUCEL FIBERSHAKE PO Take 1 Scoop by mouth daily before breakfast. Mix 1 scoopful into 8 ounces of water and drink every morning   COLLAGEN PO Take 1 Scoop by mouth daily before breakfast. (Coffee)   cyanocobalamin 1000 MCG tablet Commonly known as: VITAMIN B12 Take 1,000 mcg by mouth 3 (three) times a week. ( /16/24 1105            Diagnostic Studies: DG Knee Left  Port  Result Date: 10/27/2022 CLINICAL DATA:  [id number] Status post total left knee replacement [id number] EXAM: PORTABLE LEFT KNEE - 1-2 VIEW  COMPARISON:  Radiograph 03/10/2022 FINDINGS: Postsurgical changes of left total knee arthroplasty. Normal alignment. No evidence of loosening or fracture. Unchanged small exostosis arising from the medial aspect of the proximal tibial metaphysis. Expected soft tissue changes. IMPRESSION: Postsurgical changes of left total knee arthroplasty. No evidence of immediate hardware complication. Electronically Signed   By: Caprice Renshaw M.D.   On: 10/27/2022 10:24    Disposition: Discharge disposition: 01-Home or Self Care          Follow-up Information     [facility] Follow up.   Specialty: Home Health Services Why: The home health agency will contact you for the first home visit Contact information: [address] [place] [address] [phone]         Kathryne Hitch, MD Follow up in 2 week(s).   Specialty: Orthopedic Surgery Contact information: [address] [place] [address] [phone]                  Signed: Kathryne Hitch 10/28/2022, 8:20 AM

## 2022-10-28 NOTE — Progress Notes (Signed)
Physical Therapy Treatment Patient Details Name: Lucas Shepherd MRN: [id number] DOB: 09-02-1928 Today's Date: 10/28/2022   History of Present Illness 89 y.o. male presents to [facility] hospital on 10/27/2022 for elective L TKA. PMH includes low back pain, syncope, hiatal hernia, BPH, GERD, GIB.    PT Comments    AM session: Pt with steady progress towards acute goals, however pt limited this session due to increased LLE pain and UE pain/fatigue with ambulation. Pt needing up to mod A to power up to stand from EOB and steady on rise as pt with increased L lateral lean when coming to stand secondary to LLE pain and pt hesitant to place weight through LLE. Pt needing up to min A during gait with RW support to steady and for cues for sequencing and RW proximity, pt needing frequent standing rest breaks due to fatigue however pt putting forth great effort and motivated to progress to home. Unable to progress to stair training this session, will plan to see pt in PM. Pt continues to benefit from skilled PT services to progress toward functional mobility goals.    Recommendations for follow up therapy are one component of a multi-disciplinary discharge planning process, led by the attending physician.  Recommendations may be updated based on patient status, additional functional criteria and insurance authorization.  Follow Up Recommendations       Assistance Recommended at Discharge PRN  Patient can return home with the following A little help with bathing/dressing/bathroom;Assistance with cooking/housework;Assist for transportation;Help with stairs or ramp for entrance   Equipment Recommendations  None recommended by PT    Recommendations for Other Services       Precautions / Restrictions Precautions Precautions: Knee Precaution Booklet Issued: Yes (comment) Restrictions Weight Bearing Restrictions: Yes LLE Weight Bearing: Weight bearing as tolerated     Mobility  Bed Mobility Overal  bed mobility: Needs Assistance Bed Mobility: Supine to Sit, Sit to Supine     Supine to sit: Supervision, HOB elevated Sit to supine: Min assist   General bed mobility comments: min A to return LEs to bed    Transfers Overall transfer level: Needs assistance Equipment used: Rolling walker (2 wheels) Transfers: Sit to/from Stand Sit to Stand: From elevated surface, Min assist, Mod assist           General transfer comment: min A to power up from EOB at lowest height, mod A to steady as pt with increased L lateral lean on rise due to pain and decreased WBing through LLE    Ambulation/Gait Ambulation/Gait assistance: Min guard, Min assist Gait Distance (Feet): 90 Feet Assistive device: Rolling walker (2 wheels) Gait Pattern/deviations: Step-through pattern, Knee flexed in stance - left Gait velocity: reduced     General Gait Details: slowed step-through gait with pt needing frequent standing rest breaks, reduced stride length and increased L knee flexion during stance phase, pt with increased c/o R shoulder and bil tricep pain throughout gait and feeling as through his "arms are giving out'   Stairs Stairs:  (unable to progress this session due to UE fatigue and L knee pain, will attempt in PM session)           Wheelchair Mobility    Modified Rankin (Stroke Patients Only)       Balance Overall balance assessment: Needs assistance Sitting-balance support: No upper extremity supported, Feet supported Sitting balance-Leahy Scale: Good     Standing balance support: Single extremity supported, Bilateral upper extremity supported, Reliant on assistive device  for balance Standing balance-Leahy Scale: Poor                              Cognition Arousal/Alertness: Awake/alert Behavior During Therapy: WFL for tasks assessed/performed Overall Cognitive Status: Within Functional Limits for tasks assessed                                           Exercises      General Comments General comments (skin integrity, edema, etc.): VSS on RA, pt daughter present and supportive throughout session      Pertinent Vitals/Pain Pain Assessment Pain Assessment: 0-10 Pain Score: 9  Pain Location: L knee, R shuolder and triceps during gait Pain Descriptors / Indicators: Aching Pain Intervention(s): Monitored during session, Limited activity within patient's tolerance    Home Living                          Prior Function            PT Goals (current goals can now be found in the care plan section) Acute Rehab PT Goals Patient Stated Goal: to return to independence PT Goal Formulation: With patient Time For Goal Achievement: 10/31/22 Progress towards PT goals: Progressing toward goals    Frequency    7X/week      PT Plan      Co-evaluation              AM-PAC PT "6 Clicks" Mobility   Outcome Measure  Help needed turning from your back to your side while in a flat bed without using bedrails?: A Little Help needed moving from lying on your back to sitting on the side of a flat bed without using bedrails?: A Little Help needed moving to and from a bed to a chair (including a wheelchair)?: A Little Help needed standing up from a chair using your arms (e.g., wheelchair or bedside chair)?: A Little Help needed to walk in hospital room?: A Little Help needed climbing 3-5 steps with a railing? : A Lot 6 Click Score: 17    End of Session Equipment Utilized During Treatment: Gait belt Activity Tolerance: Patient tolerated treatment well Patient left: with call bell/phone within reach;with family/visitor present;in bed Nurse Communication: Mobility status;Patient requests pain meds;Other (comment) (pt in need of PM session before D/C) PT Visit Diagnosis: Other abnormalities of gait and mobility (R26.89);Muscle weakness (generalized) (M62.81);Pain Pain - Right/Left: Left Pain - part of body: Knee      Time: 8119-1478 PT Time Calculation (min) (ACUTE ONLY): 30 min  Charges:  $Gait Training: 23-37 mins                     Neriah Brott R. PTA Acute Rehabilitation Services Office: [phone]    Catalina Antigua 10/28/2022, 10:08 AM

## 2022-10-28 NOTE — Discharge Instructions (Signed)

## 2022-10-28 NOTE — Progress Notes (Signed)
Subjective: 1 Day Post-Op Procedure(s) (LRB): LEFT TOTAL KNEE ARTHROPLASTY (Left) Patient reports pain as moderate.    Objective: Vital signs in last 24 hours: Temp:  [97 F (36.1 C)-98.5 F (36.9 C)] 98.3 F (36.8 C) (04/17 0723) Pulse Rate:  [55-78] 57 (04/17 0723) Resp:  [11-19] 16 (04/17 0723) BP: (98-153)/(59-94) 98/59 (04/17 0723) SpO2:  [94 %-100 %] 98 % (04/17 0723)  Intake/Output from previous day: 04/16 0701 - 04/17 0700 In: 1920 [P.O.:720; I.V.:1200] Out: 625 [Urine:600; Blood:25] Intake/Output this shift: No intake/output data recorded.  Recent Labs    10/28/22 0441  HGB 11.1*   Recent Labs    10/28/22 0441  WBC 7.9  RBC 3.48*  HCT 33.4*  PLT 172   Recent Labs    10/28/22 0441  NA 134*  K 4.3  CL 104  CO2 23  BUN 20  CREATININE 1.13  GLUCOSE 95  CALCIUM 8.7*   No results for input(s): "LABPT", "INR" in the last 72 hours.  Sensation intact distally Intact pulses distally Dorsiflexion/Plantar flexion intact Incision: dressing C/D/I   Assessment/Plan: 1 Day Post-Op Procedure(s) (LRB): LEFT TOTAL KNEE ARTHROPLASTY (Left) Up with therapy Discharge home with home health      Kathryne Hitch 10/28/2022, 8:18 AM

## 2022-10-28 NOTE — Evaluation (Signed)
Occupational Therapy Evaluation Patient Details Name: Lucas Shepherd MRN: [id number] DOB: 1928/07/31 Today's Date: 10/28/2022   History of Present Illness 89 y.o. male presents to [facility] hospital on 10/27/2022 for elective L TKA. PMH includes low back pain, syncope, hiatal hernia, BPH, GERD, GIB.   Clinical Impression   Pt was ambulating with a cane and functioning at a modified independently in self care. He drives. Pt presents with significant L knee pain limiting mobility and ability to participate. Pt currently requires set up to max assist for ADLs. Pt has a reacher at home and all necessary DME for showering in his walk in shower. Recommended 3 in 1 and educated in use, pt asking cost--deferred to CM. Pt instructed in dressing L LE first with use of reacher to start pants over feet and use of long handled bath sponge for L foot. Pt will rely on his wife to assist with his L sock. Will follow acutely, do not anticipate pt will need post acute OT.      Recommendations for follow up therapy are one component of a multi-disciplinary discharge planning process, led by the attending physician.  Recommendations may be updated based on patient status, additional functional criteria and insurance authorization.   Assistance Recommended at Discharge Frequent or constant Supervision/Assistance  Patient can return home with the following A lot of help with walking and/or transfers;A lot of help with bathing/dressing/bathroom;Assistance with cooking/housework;Assist for transportation;Help with stairs or ramp for entrance    Functional Status Assessment  Patient has had a recent decline in their functional status and demonstrates the ability to make significant improvements in function in a reasonable and predictable amount of time.  Equipment Recommendations  BSC/3in1    Recommendations for Other Services       Precautions / Restrictions Precautions Precautions: Knee;Fall Precaution Booklet  Issued: Yes (comment) Restrictions Weight Bearing Restrictions: Yes LLE Weight Bearing: Weight bearing as tolerated      Mobility Bed Mobility Overal bed mobility: Needs Assistance Bed Mobility: Supine to Sit, Sit to Supine     Supine to sit: Supervision, HOB elevated Sit to supine: Min assist   General bed mobility comments: min A to return LEs to bed    Transfers Overall transfer level: Needs assistance Equipment used: Rolling walker (2 wheels) Transfers: Sit to/from Stand Sit to Stand: From elevated surface, Mod assist                  Balance Overall balance assessment: Needs assistance Sitting-balance support: No upper extremity supported, Feet supported Sitting balance-Leahy Scale: Good     Standing balance support: Bilateral upper extremity supported Standing balance-Leahy Scale: Poor                             ADL either performed or assessed with clinical judgement   ADL Overall ADL's : Needs assistance/impaired Eating/Feeding: Independent   Grooming: Sitting;Set up   Upper Body Bathing: Set up;Sitting   Lower Body Bathing: Maximal assistance;Sit to/from stand   Upper Body Dressing : Set up;Sitting   Lower Body Dressing: Maximal assistance;Sit to/from stand                       Vision Ability to See in Adequate Light: 0 Adequate Patient Visual Report: No change from baseline       Perception     Praxis      Pertinent Vitals/Pain Pain Assessment Pain Assessment:  Faces Faces Pain Scale: Hurts whole lot Pain Location: L knee Pain Descriptors / Indicators: Aching Pain Intervention(s): Monitored during session, Premedicated before session, Repositioned, Ice applied     Hand Dominance Right   Extremity/Trunk Assessment Upper Extremity Assessment Upper Extremity Assessment: RUE deficits/detail RUE Deficits / Details: pain at end range shoulder flexion, s/p reverse TSA   Lower Extremity Assessment Lower  Extremity Assessment: Defer to PT evaluation   Cervical / Trunk Assessment Cervical / Trunk Assessment: Normal   Communication Communication Communication: No difficulties   Cognition Arousal/Alertness: Awake/alert Behavior During Therapy: WFL for tasks assessed/performed Overall Cognitive Status: Within Functional Limits for tasks assessed                                       General Comments  VSS on RA, pt daughter present and supportive throughout session    Exercises     Shoulder Instructions      Home Living Family/patient expects to be discharged to:: Private residence Living Arrangements: Spouse/significant other Available Help at Discharge: Family;Available 24 hours/day Type of Home: House Home Access: Stairs to enter [employer] of Steps: 2 Entrance Stairs-Rails: Left Home Layout: Multi-level;Able to live on main level with bedroom/bathroom     Bathroom Shower/Tub: Arts development officer Toilet: Handicapped height     Home Equipment: Agricultural consultant (2 wheels);Cane - single point;Shower seat - built in;Grab bars - tub/shower;Hand held shower head;Adaptive equipment Adaptive Equipment: Reacher        Prior Functioning/Environment Prior Level of Function : Independent/Modified Independent;Driving             Mobility Comments: ambulating with SPC          OT Problem List: Decreased strength;Decreased activity tolerance      OT Treatment/Interventions: Self-care/ADL training;DME and/or AE instruction;Therapeutic activities;Patient/family education;Balance training    OT Goals(Current goals can be found in the care plan section) Acute Rehab OT Goals OT Goal Formulation: With patient Time For Goal Achievement: 11/11/22 Potential to Achieve Goals: Good ADL Goals Pt Will Perform Grooming: with supervision;standing Pt Will Perform Lower Body Dressing: with min assist;with adaptive equipment;sit to/from stand Pt Will  Transfer to Toilet: with supervision;ambulating;bedside commode Pt Will Perform Toileting - Clothing Manipulation and hygiene: with supervision;sit to/from stand Pt Will Perform Tub/Shower Transfer: Shower transfer;with min guard assist;grab bars;rolling walker;shower seat;ambulating Additional ADL Goal #1: Pt will perform bed mobility with min assist in preparation for ADLs.  OT Frequency: Min 2X/week    Co-evaluation              AM-PAC OT "6 Clicks" Daily Activity     Outcome Measure Help from another person eating meals?: None Help from another person taking care of personal grooming?: A Little Help from another person toileting, which includes using toliet, bedpan, or urinal?: A Lot Help from another person bathing (including washing, rinsing, drying)?: A Lot Help from another person to put on and taking off regular upper body clothing?: A Little Help from another person to put on and taking off regular lower body clothing?: A Lot 6 Click Score: 16   End of Session Equipment Utilized During Treatment: Rolling walker (2 wheels);Gait belt  Activity Tolerance: Patient limited by pain Patient left: in bed;with call bell/phone within reach;with family/visitor present  OT Visit Diagnosis: Unsteadiness on feet (R26.81);Other abnormalities of gait and mobility (R26.89);Pain  Time: 1610-9604 OT Time Calculation (min): 21 min Charges:  OT General Charges $OT Visit: 1 Visit OT Evaluation $OT Eval Moderate Complexity: 1 Mod  Berna Spare, OTR/L Acute Rehabilitation Services Office: [phone]  Evern Bio 10/28/2022, 10:48 AM

## 2022-10-29 DIAGNOSIS — M1712 Unilateral primary osteoarthritis, left knee: Secondary | ICD-10-CM | POA: Diagnosis not present

## 2022-10-29 DIAGNOSIS — M25511 Pain in right shoulder: Secondary | ICD-10-CM | POA: Diagnosis not present

## 2022-10-29 DIAGNOSIS — Z87891 Personal history of nicotine dependence: Secondary | ICD-10-CM | POA: Diagnosis not present

## 2022-10-29 DIAGNOSIS — Z96652 Presence of left artificial knee joint: Secondary | ICD-10-CM | POA: Diagnosis not present

## 2022-10-29 DIAGNOSIS — M25551 Pain in right hip: Secondary | ICD-10-CM | POA: Diagnosis not present

## 2022-10-29 DIAGNOSIS — Z96611 Presence of right artificial shoulder joint: Secondary | ICD-10-CM | POA: Diagnosis not present

## 2022-10-29 MED ORDER — TIZANIDINE HCL 4 MG PO TABS: 4.0000 mg | ORAL_TABLET | Freq: Three times a day (TID) | ORAL | 0 refills | Status: DC | PRN

## 2022-10-29 NOTE — Progress Notes (Signed)
Patient alert and oriented, voiding adequately, skin clean, dry and intact without evidence of skin break down, or symptoms of complications - no redness or edema noted, only slight tenderness at site.  Patient states pain is manageable at time of discharge. Patient has an appointment with MD in 2 weeks 

## 2022-10-29 NOTE — Progress Notes (Addendum)
Physical Therapy Treatment Patient Details Name: Lucas Shepherd MRN: [id number] DOB: [birth year] Today's Date: 10/29/2022   History of Present Illness 89 y.o. male presents to [facility] hospital on 10/27/2022 for elective L TKA. PMH includes low back pain, syncope, hiatal hernia, BPH, GERD, GIB.    PT Comments    Pt greeted supine and agreeable to session with focus on family education and family assist with functional transfers in anticipation of d/c to home. Pt able to come to sitting EOB with min A to manage LLE. Pt continues to require up to mod A to come to stand from EOB at lowest height x1 with this PTA. Educated and demonstrated guarding and assist technique to daughter with pt able to come to stand x3 trials with pt daughter providing mod A to rise and steady and with cues during throughout for technique. Pt was educated on continued walker use to maximize functional independence, safety, and decrease risk for falls as well as safe car entry/exit, ice, appropriate activity progression and general safety with mobility with pt verbalizing understanding. Pt and pt daughter without questions or concerns. Anticipate safe discharge, with assist level outlined below, once medically cleared, will continue to follow acutely.     Recommendations for follow up therapy are one component of a multi-disciplinary discharge planning process, led by the attending physician.  Recommendations may be updated based on patient status, additional functional criteria and insurance authorization.  Follow Up Recommendations       Assistance Recommended at Discharge PRN  Patient can return home with the following A little help with bathing/dressing/bathroom;Assistance with cooking/housework;Assist for transportation;Help with stairs or ramp for entrance;A little help with walking and/or transfers   Equipment Recommendations  None recommended by PT    Recommendations for Other Services       Precautions /  Restrictions Precautions Precautions: Knee Precaution Booklet Issued: Yes (comment) Restrictions Weight Bearing Restrictions: Yes LLE Weight Bearing: Weight bearing as tolerated     Mobility  Bed Mobility Overal bed mobility: Needs Assistance Bed Mobility: Supine to Sit       Sit to supine: Min assist   General bed mobility comments: min A to manage LLE    Transfers Overall transfer level: Needs assistance Equipment used: Rolling walker (2 wheels) Transfers: Sit to/from Stand, Bed to chair/wheelchair/BSC Sit to Stand: Mod assist           General transfer comment: mod A to power up from EOB at lowest height x1 with this PTA, educated pt daughter on guarding and asssit technique and daughter and pt able to perform x3 with cues and daughter providing mod A    Ambulation/Gait Ambulation/Gait assistance: Min guard, Min assist Educational psychologist (Feet): 120 Feet Assistive device: Rolling walker (2 wheels) Gait Pattern/deviations: Step-through pattern, Knee flexed in stance - left, Step-to pattern, Trunk flexed Gait velocity: reduced     General Gait Details: gait deferred for transfer practice with family   Stairs Stairs: Yes Stairs assistance: Mod assist Stair Management: One rail Left, Step to pattern, Sideways Number of Stairs: 3 General stair comments: pt able to recall safe stair sequencing   Wheelchair Mobility    Modified Rankin (Stroke Patients Only)       Balance Overall balance assessment: Needs assistance Sitting-balance support: No upper extremity supported, Feet supported Sitting balance-Leahy Scale: Good     Standing balance support: Single extremity supported, Bilateral upper extremity supported, Reliant on assistive device for balance Standing balance-Leahy Scale: Poor Standing balance comment: heavy  reliance on UE support                            Cognition Arousal/Alertness: Awake/alert Behavior During Therapy: WFL for tasks  assessed/performed Overall Cognitive Status: Within Functional Limits for tasks assessed                                          Exercises      General Comments General comments (skin integrity, edema, etc.): VSS on RA      Pertinent Vitals/Pain Pain Assessment Pain Assessment: Faces Faces Pain Scale: Hurts even more Pain Location: L knee Pain Descriptors / Indicators: Aching, Sore, Squeezing Pain Intervention(s): Monitored during session, Limited activity within patient's tolerance    Home Living                          Prior Function            PT Goals (current goals can now be found in the care plan section) Acute Rehab PT Goals Patient Stated Goal: to return to independence PT Goal Formulation: With patient Time For Goal Achievement: 10/31/22 Progress towards PT goals: Progressing toward goals    Frequency    7X/week      PT Plan      Co-evaluation              AM-PAC PT "6 Clicks" Mobility   Outcome Measure  Help needed turning from your back to your side while in a flat bed without using bedrails?: A Little Help needed moving from lying on your back to sitting on the side of a flat bed without using bedrails?: A Little Help needed moving to and from a bed to a chair (including a wheelchair)?: A Little Help needed standing up from a chair using your arms (e.g., wheelchair or bedside chair)?: A Lot Help needed to walk in hospital room?: A Little Help needed climbing 3-5 steps with a railing? : A Lot 6 Click Score: 16    End of Session Equipment Utilized During Treatment: Gait belt Activity Tolerance: Patient tolerated treatment well Patient left: with call bell/phone within reach;Other (comment);with family/visitor present;in bed (seated EOB) Nurse Communication: Mobility status PT Visit Diagnosis: Other abnormalities of gait and mobility (R26.89);Muscle weakness (generalized) (M62.81);Pain Pain - Right/Left:  Left Pain - part of body: Knee     Time: 1610-9604 PT Time Calculation (min) (ACUTE ONLY): 23 min  Charges: $Therapeutic Activity: 23-37 mins                     Myalynn Lingle R. PTA Acute Rehabilitation Services Office: [phone]   Catalina Antigua 10/29/2022, 1:52 PM

## 2022-10-29 NOTE — Discharge Summary (Signed)
Patient ID: Lucas Shepherd MRN: [id number] DOB/AGE: 20-May-1929 89 y.o.  Admit date: 10/27/2022 Discharge date: 10/29/2022  Admission Diagnoses:  Principal Problem:   Unilateral primary osteoarthritis, left knee Active Problems:   Status post total left knee replacement   Discharge Diagnoses:  Same  Past Medical History:  Diagnosis Date   Anemia    related to past gastric ulcers   Arthritis    Blood transfusion    BPH (benign prostatic hyperplasia)    External hemorrhoids    GERD (gastroesophageal reflux disease)    nisseum fundoplication 2010   GI bleed 06/2011   got 3 units blood   Hiatal hernia    History of kidney stones    History of TMJ syndrome    mild   History of urinary frequency    Osteoporosis    Urinary urgency     Surgeries: Procedure(s): LEFT TOTAL KNEE ARTHROPLASTY on 10/27/2022   Consultants:   Discharged Condition: Improved  Hospital Course: Lucas Shepherd is an 89 y.o. male who was admitted 10/27/2022 for operative treatment ofUnilateral primary osteoarthritis, left knee. Patient has severe unremitting pain that affects sleep, daily activities, and work/hobbies. After pre-op clearance the patient was taken to the operating room on 10/27/2022 and underwent  Procedure(s): LEFT TOTAL KNEE ARTHROPLASTY.    Patient was given perioperative antibiotics:  Anti-infectives (From admission, onward)    Start     Dose/Rate Route Frequency Ordered Stop   10/27/22 1200  ceFAZolin (ANCEF) IVPB 1 g/50 mL premix        1 g 100 mL/hr over 30 Minutes Intravenous Every 6 hours 10/27/22 1105 10/28/22 0833   10/27/22 0600  ceFAZolin (ANCEF) IVPB 2g/100 mL premix        2 g 200 mL/hr over 30 Minutes Intravenous On call to O.R. 10/27/22 0551 10/27/22 0810   10/27/22 0557  ceFAZolin (ANCEF) 2-4 GM/100ML-% IVPB       Note to Pharmacy: Glenna Fellows: cabinet override      10/27/22 0557 10/27/22 0756        Patient was given sequential compression devices,  early ambulation, and chemoprophylaxis to prevent DVT.  Patient benefited maximally from hospital stay and there were no complications.    Recent vital signs: Patient Vitals for the past 24 hrs:  BP Temp Temp src Pulse Resp SpO2  10/29/22 0450 127/76 98.4 F (36.9 C) Oral 86 18 100 %  10/28/22 2313 131/65 97.7 F (36.5 C) Oral 67 18 100 %  10/28/22 1956 (!) 112/54 98.8 F (37.1 C) Oral 70 18 98 %  10/28/22 1806 135/64 97.6 F (36.4 C) Oral 74 16 100 %     Recent laboratory studies:  Recent Labs    10/28/22 0441  WBC 7.9  HGB 11.1*  HCT 33.4*  PLT 172  NA 134*  K 4.3  CL 104  CO2 23  BUN 20  CREATININE 1.13  GLUCOSE 95  CALCIUM 8.7*     Discharge Medications:   Allergies as of 10/29/2022       Reactions   Aspirin Other (See Comments)   History of ulcers   Nsaids Other (See Comments)   History of ulcers        Medication List     TAKE these medications    apixaban 2.5 MG Tabs tablet Commonly known as: ELIQUIS Take 1 tablet (2.5 mg total) by mouth every 12 (twelve) hours.   CALCIUM + D3 PO Take 1 tablet by mouth daily after  breakfast.   CITRUCEL FIBERSHAKE PO Take 1 Scoop by mouth daily before breakfast. Mix 1 scoopful into 8 ounces of water and drink every morning   COLLAGEN PO Take 1 Scoop by mouth daily before breakfast. (Coffee)   cyanocobalamin 1000 MCG tablet Commonly known as: VITAMIN B12 Take 1,000 mcg by mouth 3 (three) times a week. (Sundays, Tuesdays & Fridays)   denosumab 60 MG/ML Sosy injection Commonly known as: PROLIA Inject 60 mg into the skin every 6 (six) months.   diclofenac Sodium 1 % Gel Commonly known as: VOLTAREN Apply 1 Application topically 4 (four) times daily as needed (pain.).   famotidine 20 MG tablet Commonly known as: PEPCID Take 1 tablet (20 mg) by mouth every morning and 2 tablets (40 mg) by mouth every evening.   finasteride 5 MG tablet Commonly known as: PROSCAR Take 5 mg by mouth at bedtime.    fluticasone 50 MCG/ACT nasal spray Commonly known as: FLONASE Place 1 spray into both nostrils at bedtime.   gabapentin 300 MG capsule Commonly known as: NEURONTIN Take 300 mg by mouth daily after supper.   Magnesium Oxide (Laxative) 500 MG Tabs Take 500 mg by mouth daily after breakfast.   multivitamin with minerals Tabs tablet Take 1 tablet by mouth daily after breakfast.   oxyCODONE 5 MG immediate release tablet Commonly known as: Oxy IR/ROXICODONE Take 1-2 tablets (5-10 mg total) by mouth every 6 (six) hours as needed for moderate pain (pain score 4-6). What changed:  how much to take when to take this reasons to take this   polyethylene glycol powder 17 GM/SCOOP powder Commonly known as: GLYCOLAX/MIRALAX Take 17 g by mouth daily before breakfast. (Coffee)   PRESERVISION AREDS 2 PO Take 1 tablet by mouth in the morning and at bedtime.   tamsulosin 0.4 MG Caps capsule Commonly known as: FLOMAX Take 0.4 mg by mouth at bedtime.   tiZANidine 4 MG tablet Commonly known as: ZANAFLEX Take 1 tablet (4 mg total) by mouth every 8 (eight) hours as needed for muscle spasms.   TURMERIC PO Take 1 capsule by mouth daily after breakfast.               Durable Medical Equipment  (From admission, onward)           Start     Ordered   10/27/22 1106  DME 3 n 1  Once        10/27/22 1105   10/27/22 1106  DME Walker rolling  Once       Question Answer Comment  Walker: With 5 Inch Wheels   Patient needs a walker to treat with the following condition Status post total left knee replacement      04 /16/24 1105            Diagnostic Studies: DG Knee Left Port  Result Date: 10/27/2022 CLINICAL DATA:  [id number] Status post total left knee replacement [id number] EXAM: PORTABLE LEFT KNEE - 1-2 VIEW COMPARISON:  Radiograph 03/10/2022 FINDINGS: Postsurgical changes of left total knee arthroplasty. Normal alignment. No evidence of loosening or fracture. Unchanged small  exostosis arising from the medial aspect of the proximal tibial metaphysis. Expected soft tissue changes. IMPRESSION: Postsurgical changes of left total knee arthroplasty. No evidence of immediate hardware complication. Electronically Signed   By: Caprice Renshaw M.D.   On: 10/27/2022 10:24    Disposition: Discharge disposition: 01-Home or Self Care          Follow-up Information  [facility] Follow up.   Specialty: Home Health Services Why: The home health agency will contact you for the first home visit Contact information: [address] [place] [address] [phone]         Kathryne Hitch, MD Follow up in 2 week(s).   Specialty: Orthopedic Surgery Contact information: [address] [place] [address] [phone]                  Signed: Richardean Canal 10/29/2022, 10:40 AM

## 2022-10-29 NOTE — Progress Notes (Signed)
Physical Therapy Treatment Patient Details Name: Lucas Shepherd MRN: [id number] DOB: [birth year] Today's Date: 10/29/2022   History of Present Illness 89 y.o. male presents to [facility] hospital on 10/27/2022 for elective L TKA. PMH includes low back pain, syncope, hiatal hernia, BPH, GERD, GIB.    PT Comments    Pt with continued progress towards acute goals, however pt continues to require mod A for functional transfers and stair negotiation. Pt able to power up from chair, without armrest use to simulate home environment, with mod A to power up and steady on rise to RW. Pt demonstrating increased gait tolerance with RW support and min guard for safety with cues for technique. Pt able to progress stair training this session with mod A to steady with pt able to ascend/descend 3 steps simulating home entrance. Pt daughter present throughout session, with pt and daughter reporting pt spouse unable to physically assist pt at home, discussed need for increased home assist post acutely for pt safety, pt daughter verbalizing understanding and endorsing multiple family members available for up to 24/7 assist as needed. Pt continues to benefit from skilled PT services to progress toward functional mobility goals.    Recommendations for follow up therapy are one component of a multi-disciplinary discharge planning process, led by the attending physician.  Recommendations may be updated based on patient status, additional functional criteria and insurance authorization.  Follow Up Recommendations       Assistance Recommended at Discharge PRN  Patient can return home with the following A little help with bathing/dressing/bathroom;Assistance with cooking/housework;Assist for transportation;Help with stairs or ramp for entrance;A little help with walking and/or transfers   Equipment Recommendations  None recommended by PT    Recommendations for Other Services       Precautions / Restrictions  Precautions Precautions: Knee Precaution Booklet Issued: Yes (comment) Restrictions Weight Bearing Restrictions: Yes LLE Weight Bearing: Weight bearing as tolerated     Mobility  Bed Mobility Overal bed mobility: Needs Assistance             General bed mobility comments: pt up in chair on arrival    Transfers Overall transfer level: Needs assistance Equipment used: Rolling walker (2 wheels) Transfers: Sit to/from Stand, Bed to chair/wheelchair/BSC Sit to Stand: Mod assist           General transfer comment: mod A to power up from low recliner without armrests to simulate home    Ambulation/Gait Ambulation/Gait assistance: Min guard, Min assist Gait Distance (Feet): 120 Feet Assistive device: Rolling walker (2 wheels) Gait Pattern/deviations: Step-through pattern, Knee flexed in stance - left, Step-to pattern, Trunk flexed Gait velocity: reduced     General Gait Details: slow step-to gait progress to slight step-through pattern with increased distance, slightly increasd gait speed this session, continues to require VCs for sequencing and RW proximity   Stairs Stairs: Yes Stairs assistance: Mod assist Stair Management: One rail Left, Step to pattern, Sideways Number of Stairs: 3 General stair comments: instructed and demonstrated technique with pt able to demo back with mod A to ascend/descend 3 steps sideways with 2 hands on L rail to simulate home   Wheelchair Mobility    Modified Rankin (Stroke Patients Only)       Balance Overall balance assessment: Needs assistance Sitting-balance support: No upper extremity supported, Feet supported Sitting balance-Leahy Scale: Good     Standing balance support: Single extremity supported, Bilateral upper extremity supported, Reliant on assistive device for balance Standing balance-Leahy Scale: Poor Standing  balance comment: heavy reliance on UE support                            Cognition  Arousal/Alertness: Awake/alert Behavior During Therapy: WFL for tasks assessed/performed Overall Cognitive Status: Within Functional Limits for tasks assessed                                          Exercises      General Comments General comments (skin integrity, edema, etc.): VSS on RA, daughter present and supportive, pt and daughter reporting pt spouse unable to physcially assist at home, discussed need for 24/7 assist at home post acutely to assist with trasnfers and gait as needed as pt continues to need mod A to rise to stand and steady      Pertinent Vitals/Pain Pain Assessment Pain Assessment: Faces Faces Pain Scale: Hurts even more Pain Location: L knee Pain Descriptors / Indicators: Aching, Sore, Squeezing Pain Intervention(s): Monitored during session, Limited activity within patient's tolerance, Premedicated before session    Home Living                          Prior Function            PT Goals (current goals can now be found in the care plan section) Acute Rehab PT Goals Patient Stated Goal: to return to independence PT Goal Formulation: With patient Time For Goal Achievement: 10/31/22 Progress towards PT goals: Progressing toward goals    Frequency    7X/week      PT Plan      Co-evaluation              AM-PAC PT "6 Clicks" Mobility   Outcome Measure  Help needed turning from your back to your side while in a flat bed without using bedrails?: A Little Help needed moving from lying on your back to sitting on the side of a flat bed without using bedrails?: A Little Help needed moving to and from a bed to a chair (including a wheelchair)?: A Little Help needed standing up from a chair using your arms (e.g., wheelchair or bedside chair)?: A Lot Help needed to walk in hospital room?: A Little Help needed climbing 3-5 steps with a railing? : A Lot 6 Click Score: 16    End of Session Equipment Utilized During  Treatment: Gait belt Activity Tolerance: Patient tolerated treatment well Patient left: with call bell/phone within reach;in chair;Other (comment);with family/visitor present (with iceman applied) Nurse Communication: Mobility status PT Visit Diagnosis: Other abnormalities of gait and mobility (R26.89);Muscle weakness (generalized) (M62.81);Pain Pain - Right/Left: Left Pain - part of body: Knee     Time: 4098-1191 PT Time Calculation (min) (ACUTE ONLY): 34 min  Charges:  $Gait Training: 23-37 mins                     Maguire Killmer R. PTA Acute Rehabilitation Services Office: [phone]    Catalina Antigua 10/29/2022, 10:12 AM

## 2022-10-29 NOTE — Progress Notes (Signed)
Subjective: 2 Days Post-Op Procedure(s) (LRB): LEFT TOTAL KNEE ARTHROPLASTY (Left) Patient reports pain as 3 on 0-10 scale.    Objective: Vital signs in last 24 hours: Temp:  [97.6 F (36.4 C)-98.8 F (37.1 C)] 98.4 F (36.9 C) (04/18 0450) Pulse Rate:  [67-86] 86 (04/18 0450) Resp:  [16-18] 18 (04/18 0450) BP: (112-135)/(54-76) 127/76 (04/18 0450) SpO2:  [98 %-100 %] 100 % (04/18 0450)  Intake/Output from previous day: 04/17 0701 - 04/18 0700 In: 720 [P.O.:720] Out: 600 [Urine:600] Intake/Output this shift: No intake/output data recorded.  Recent Labs    10/28/22 0441  HGB 11.1*   Recent Labs    10/28/22 0441  WBC 7.9  RBC 3.48*  HCT 33.4*  PLT 172   Recent Labs    10/28/22 0441  NA 134*  K 4.3  CL 104  CO2 23  BUN 20  CREATININE 1.13  GLUCOSE 95  CALCIUM 8.7*   No results for input(s): "LABPT", "INR" in the last 72 hours.  Dorsiflexion/Plantar flexion intact Incision: dressing C/D/I Compartment soft   Assessment/Plan: 2 Days Post-Op Procedure(s) (LRB): LEFT TOTAL KNEE ARTHROPLASTY (Left) Up with therapy Discharge home with home health      Richardean Canal 10/29/2022, 10:29 AM

## 2022-10-29 NOTE — Progress Notes (Signed)
Occupational Therapy Treatment Patient Details Name: Lucas Shepherd MRN: [id number] DOB: 09/02/1928 Today's Date: 10/29/2022   History of present illness 89 y.o. male presents to [facility] hospital on 10/27/2022 for elective L TKA. PMH includes low back pain, syncope, hiatal hernia, BPH, GERD, GIB.   OT comments  L knee pain improved from yesterday, more moderate today. Family has decided to rent a lift chair as pt typically sits on a low couch and continues to require moderate assistance to stand. Agrees a 3 in 1 over his toilet and possibly as a shower seat would be required. Educated in LB dressing with compression sock and reinforced dressing operated leg first. Pt plans to wear elastic waist pants. He remains heavily dependent on UEs in standing and needing to sit for grooming. Verbally educated in shower transfer, pt declined practice, reports he will wait several days to shower. Issued gait belt and instructed in use so family members avoid pulling on pt's UEs and for use as a leg lifter in and out of bed.    Recommendations for follow up therapy are one component of a multi-disciplinary discharge planning process, led by the attending physician.  Recommendations may be updated based on patient status, additional functional criteria and insurance authorization.    Assistance Recommended at Discharge Frequent or constant Supervision/Assistance  Patient can return home with the following  A lot of help with walking and/or transfers;A lot of help with bathing/dressing/bathroom;Assistance with cooking/housework;Assist for transportation;Help with stairs or ramp for entrance   Equipment Recommendations  BSC/3in1    Recommendations for Other Services      Precautions / Restrictions Precautions Precautions: Fall;Knee Precaution Booklet Issued: Yes (comment) Restrictions Weight Bearing Restrictions: Yes LLE Weight Bearing: Weight bearing as tolerated       Mobility Bed Mobility                General bed mobility comments: pt up in chair on arrival    Transfers Overall transfer level: Needs assistance Equipment used: Rolling walker (2 wheels) Transfers: Sit to/from Stand Sit to Stand: Mod assist           General transfer comment: assist to rise and steady, pt plans to rent a lift chair for home and will look at height of his bed     Balance Overall balance assessment: Needs assistance   Sitting balance-Leahy Scale: Good     Standing balance support: Bilateral upper extremity supported Standing balance-Leahy Scale: Poor Standing balance comment: heavy reliance on UE support                           ADL either performed or assessed with clinical judgement   ADL Overall ADL's : Needs assistance/impaired     Grooming: Sitting;Set up Grooming Details (indicate cue type and reason): educated in positioning walker at sink, pt not yet able to release walker in static standing             Lower Body Dressing: Total assistance;Sitting/lateral leans Lower Body Dressing Details (indicate cue type and reason): assist for compression socks   Toilet Transfer Details (indicate cue type and reason): educated in use of 3 in 1 over toilet       Tub/Shower Transfer Details (indicate cue type and reason): educated in option of using 3 in 1 as a shower seat and technique with RW        Extremity/Trunk Assessment  Vision       Perception     Praxis      Cognition Arousal/Alertness: Awake/alert Behavior During Therapy: WFL for tasks assessed/performed Overall Cognitive Status: Within Functional Limits for tasks assessed                                          Exercises      Shoulder Instructions       General Comments     Pertinent Vitals/ Pain       Pain Assessment Pain Assessment: Faces Faces Pain Scale: Hurts even more Pain Location: L knee Pain Descriptors / Indicators: Aching, Sore,  Squeezing Pain Intervention(s): Premedicated before session, Monitored during session, Ice applied  Home Living                                          Prior Functioning/Environment              Frequency  Min 2X/week        Progress Toward Goals  OT Goals(current goals can now be found in the care plan section)  Progress towards OT goals: Progressing toward goals  Acute Rehab OT Goals OT Goal Formulation: With patient Time For Goal Achievement: 11/11/22 Potential to Achieve Goals: Good  Plan Discharge plan remains appropriate    Co-evaluation                 AM-PAC OT "6 Clicks" Daily Activity     Outcome Measure   Help from another person eating meals?: None Help from another person taking care of personal grooming?: A Little Help from another person toileting, which includes using toliet, bedpan, or urinal?: A Lot Help from another person bathing (including washing, rinsing, drying)?: A Lot Help from another person to put on and taking off regular upper body clothing?: A Little Help from another person to put on and taking off regular lower body clothing?: Total 6 Click Score: 15    End of Session Equipment Utilized During Treatment: Rolling walker (2 wheels);Gait belt  OT Visit Diagnosis: Unsteadiness on feet (R26.81);Other abnormalities of gait and mobility (R26.89);Pain   Activity Tolerance Patient tolerated treatment well   Patient Left in chair;with call bell/phone within reach;with family/visitor present   Nurse Communication          Time: 1610-9604 OT Time Calculation (min): 26 min  Charges: OT General Charges $OT Visit: 1 Visit OT Treatments $Self Care/Home Management : 23-37 mins  Berna Spare, OTR/L Acute Rehabilitation Services Office: [phone]   Evern Bio 10/29/2022, 11:58 AM

## 2022-10-30 ENCOUNTER — Telehealth: Payer: Self-pay | Admitting: *Deleted

## 2022-10-30 NOTE — Telephone Encounter (Signed)
Ortho bundle D/C call completed; patient states he is really having a hard time with pain. Taking Oxycodone as prescribed along with muscle relaxer. Not getting much rest.

## 2022-10-31 DIAGNOSIS — K219 Gastro-esophageal reflux disease without esophagitis: Secondary | ICD-10-CM | POA: Diagnosis not present

## 2022-10-31 DIAGNOSIS — K5730 Diverticulosis of large intestine without perforation or abscess without bleeding: Secondary | ICD-10-CM | POA: Diagnosis not present

## 2022-10-31 DIAGNOSIS — M1990 Unspecified osteoarthritis, unspecified site: Secondary | ICD-10-CM | POA: Diagnosis not present

## 2022-10-31 DIAGNOSIS — N401 Enlarged prostate with lower urinary tract symptoms: Secondary | ICD-10-CM | POA: Diagnosis not present

## 2022-10-31 DIAGNOSIS — E538 Deficiency of other specified B group vitamins: Secondary | ICD-10-CM | POA: Diagnosis not present

## 2022-10-31 DIAGNOSIS — Z471 Aftercare following joint replacement surgery: Secondary | ICD-10-CM | POA: Diagnosis not present

## 2022-10-31 DIAGNOSIS — D649 Anemia, unspecified: Secondary | ICD-10-CM | POA: Diagnosis not present

## 2022-10-31 DIAGNOSIS — M26609 Unspecified temporomandibular joint disorder, unspecified side: Secondary | ICD-10-CM | POA: Diagnosis not present

## 2022-10-31 DIAGNOSIS — M810 Age-related osteoporosis without current pathological fracture: Secondary | ICD-10-CM | POA: Diagnosis not present

## 2022-11-02 ENCOUNTER — Telehealth: Payer: Self-pay | Admitting: Orthopaedic Surgery

## 2022-11-02 ENCOUNTER — Other Ambulatory Visit: Payer: Self-pay | Admitting: Orthopaedic Surgery

## 2022-11-02 MED ORDER — CELECOXIB 200 MG PO CAPS: 200.0000 mg | ORAL_CAPSULE | Freq: Two times a day (BID) | ORAL | 1 refills | Status: DC

## 2022-11-02 NOTE — Telephone Encounter (Signed)
Patient's daughter called. Would like Celebrex called in and a different pain medication called in. The oxycodone makes him out of it. Her call back number is [phone]

## 2022-11-03 DIAGNOSIS — N401 Enlarged prostate with lower urinary tract symptoms: Secondary | ICD-10-CM | POA: Diagnosis not present

## 2022-11-03 DIAGNOSIS — K5730 Diverticulosis of large intestine without perforation or abscess without bleeding: Secondary | ICD-10-CM | POA: Diagnosis not present

## 2022-11-03 DIAGNOSIS — M26609 Unspecified temporomandibular joint disorder, unspecified side: Secondary | ICD-10-CM | POA: Diagnosis not present

## 2022-11-03 DIAGNOSIS — Z471 Aftercare following joint replacement surgery: Secondary | ICD-10-CM | POA: Diagnosis not present

## 2022-11-03 DIAGNOSIS — M810 Age-related osteoporosis without current pathological fracture: Secondary | ICD-10-CM | POA: Diagnosis not present

## 2022-11-03 DIAGNOSIS — D649 Anemia, unspecified: Secondary | ICD-10-CM | POA: Diagnosis not present

## 2022-11-03 DIAGNOSIS — E538 Deficiency of other specified B group vitamins: Secondary | ICD-10-CM | POA: Diagnosis not present

## 2022-11-03 DIAGNOSIS — K219 Gastro-esophageal reflux disease without esophagitis: Secondary | ICD-10-CM | POA: Diagnosis not present

## 2022-11-03 DIAGNOSIS — M1990 Unspecified osteoarthritis, unspecified site: Secondary | ICD-10-CM | POA: Diagnosis not present

## 2022-11-04 ENCOUNTER — Other Ambulatory Visit: Payer: Self-pay | Admitting: Orthopaedic Surgery

## 2022-11-04 DIAGNOSIS — M25562 Pain in left knee: Secondary | ICD-10-CM | POA: Diagnosis not present

## 2022-11-04 DIAGNOSIS — R1031 Right lower quadrant pain: Secondary | ICD-10-CM | POA: Diagnosis not present

## 2022-11-04 DIAGNOSIS — Z09 Encounter for follow-up examination after completed treatment for conditions other than malignant neoplasm: Secondary | ICD-10-CM | POA: Diagnosis not present

## 2022-11-05 DIAGNOSIS — D649 Anemia, unspecified: Secondary | ICD-10-CM | POA: Diagnosis not present

## 2022-11-05 DIAGNOSIS — M26609 Unspecified temporomandibular joint disorder, unspecified side: Secondary | ICD-10-CM | POA: Diagnosis not present

## 2022-11-05 DIAGNOSIS — M810 Age-related osteoporosis without current pathological fracture: Secondary | ICD-10-CM | POA: Diagnosis not present

## 2022-11-05 DIAGNOSIS — K219 Gastro-esophageal reflux disease without esophagitis: Secondary | ICD-10-CM | POA: Diagnosis not present

## 2022-11-05 DIAGNOSIS — K5730 Diverticulosis of large intestine without perforation or abscess without bleeding: Secondary | ICD-10-CM | POA: Diagnosis not present

## 2022-11-05 DIAGNOSIS — Z471 Aftercare following joint replacement surgery: Secondary | ICD-10-CM | POA: Diagnosis not present

## 2022-11-05 DIAGNOSIS — M1990 Unspecified osteoarthritis, unspecified site: Secondary | ICD-10-CM | POA: Diagnosis not present

## 2022-11-05 DIAGNOSIS — E538 Deficiency of other specified B group vitamins: Secondary | ICD-10-CM | POA: Diagnosis not present

## 2022-11-05 DIAGNOSIS — N401 Enlarged prostate with lower urinary tract symptoms: Secondary | ICD-10-CM | POA: Diagnosis not present

## 2022-11-05 MED ORDER — OXYCODONE HCL 5 MG PO TABS: 5.0000 mg | ORAL_TABLET | Freq: Four times a day (QID) | ORAL | 0 refills | Status: DC | PRN

## 2022-11-06 ENCOUNTER — Encounter: Payer: Self-pay | Admitting: Orthopaedic Surgery

## 2022-11-07 DIAGNOSIS — K5730 Diverticulosis of large intestine without perforation or abscess without bleeding: Secondary | ICD-10-CM | POA: Diagnosis not present

## 2022-11-07 DIAGNOSIS — M1990 Unspecified osteoarthritis, unspecified site: Secondary | ICD-10-CM | POA: Diagnosis not present

## 2022-11-07 DIAGNOSIS — K219 Gastro-esophageal reflux disease without esophagitis: Secondary | ICD-10-CM | POA: Diagnosis not present

## 2022-11-07 DIAGNOSIS — D649 Anemia, unspecified: Secondary | ICD-10-CM | POA: Diagnosis not present

## 2022-11-07 DIAGNOSIS — Z471 Aftercare following joint replacement surgery: Secondary | ICD-10-CM | POA: Diagnosis not present

## 2022-11-07 DIAGNOSIS — E538 Deficiency of other specified B group vitamins: Secondary | ICD-10-CM | POA: Diagnosis not present

## 2022-11-07 DIAGNOSIS — N401 Enlarged prostate with lower urinary tract symptoms: Secondary | ICD-10-CM | POA: Diagnosis not present

## 2022-11-07 DIAGNOSIS — M810 Age-related osteoporosis without current pathological fracture: Secondary | ICD-10-CM | POA: Diagnosis not present

## 2022-11-07 DIAGNOSIS — M26609 Unspecified temporomandibular joint disorder, unspecified side: Secondary | ICD-10-CM | POA: Diagnosis not present

## 2022-11-09 ENCOUNTER — Ambulatory Visit (INDEPENDENT_AMBULATORY_CARE_PROVIDER_SITE_OTHER): Payer: Medicare PPO | Admitting: Orthopaedic Surgery

## 2022-11-09 ENCOUNTER — Other Ambulatory Visit: Payer: Self-pay

## 2022-11-09 ENCOUNTER — Encounter: Payer: Self-pay | Admitting: Orthopaedic Surgery

## 2022-11-09 DIAGNOSIS — Z96652 Presence of left artificial knee joint: Secondary | ICD-10-CM

## 2022-11-09 NOTE — Progress Notes (Signed)
The patient is here today for his first postoperative visit status post a left knee replacement.  He is 89 years old.  He did have to go to the emergency room recently for swelling in his leg and the concern for DVT.  This was ruled out.  He has been compliant with a baby aspirin twice daily and has been having home therapy.  On exam his extension lacks full extension by just a few degrees and I can flex and almost 90 degrees.  The incision looks good and the staples have been removed and Steri-Strips applied.  He does have pitting edema in his left lower extremity but his calf is soft.  At this point he can stop his anti-inflammatories given his previous history of ulcers.  He can stop his aspirin as well.  He should wear compressive garments and work on elevation for the swelling.  We will see if we can transition him to outpatient physical therapy as well.  Will see him back in a month to see how he is doing overall.  All questions concerns were addressed and answered.

## 2022-11-10 ENCOUNTER — Other Ambulatory Visit: Payer: Self-pay | Admitting: Orthopaedic Surgery

## 2022-11-10 DIAGNOSIS — M810 Age-related osteoporosis without current pathological fracture: Secondary | ICD-10-CM | POA: Diagnosis not present

## 2022-11-10 DIAGNOSIS — Z471 Aftercare following joint replacement surgery: Secondary | ICD-10-CM | POA: Diagnosis not present

## 2022-11-10 DIAGNOSIS — D649 Anemia, unspecified: Secondary | ICD-10-CM | POA: Diagnosis not present

## 2022-11-10 DIAGNOSIS — M1990 Unspecified osteoarthritis, unspecified site: Secondary | ICD-10-CM | POA: Diagnosis not present

## 2022-11-10 DIAGNOSIS — K5730 Diverticulosis of large intestine without perforation or abscess without bleeding: Secondary | ICD-10-CM | POA: Diagnosis not present

## 2022-11-10 DIAGNOSIS — E538 Deficiency of other specified B group vitamins: Secondary | ICD-10-CM | POA: Diagnosis not present

## 2022-11-10 DIAGNOSIS — M26609 Unspecified temporomandibular joint disorder, unspecified side: Secondary | ICD-10-CM | POA: Diagnosis not present

## 2022-11-10 DIAGNOSIS — K219 Gastro-esophageal reflux disease without esophagitis: Secondary | ICD-10-CM | POA: Diagnosis not present

## 2022-11-10 DIAGNOSIS — N401 Enlarged prostate with lower urinary tract symptoms: Secondary | ICD-10-CM | POA: Diagnosis not present

## 2022-11-10 MED ORDER — OXYCODONE HCL 5 MG PO TABS: 5.0000 mg | ORAL_TABLET | Freq: Four times a day (QID) | ORAL | 0 refills | Status: DC | PRN

## 2022-11-12 ENCOUNTER — Encounter: Payer: Self-pay | Admitting: Orthopaedic Surgery

## 2022-11-12 DIAGNOSIS — E538 Deficiency of other specified B group vitamins: Secondary | ICD-10-CM | POA: Diagnosis not present

## 2022-11-12 DIAGNOSIS — M26609 Unspecified temporomandibular joint disorder, unspecified side: Secondary | ICD-10-CM | POA: Diagnosis not present

## 2022-11-12 DIAGNOSIS — N401 Enlarged prostate with lower urinary tract symptoms: Secondary | ICD-10-CM | POA: Diagnosis not present

## 2022-11-12 DIAGNOSIS — K219 Gastro-esophageal reflux disease without esophagitis: Secondary | ICD-10-CM | POA: Diagnosis not present

## 2022-11-12 DIAGNOSIS — D649 Anemia, unspecified: Secondary | ICD-10-CM | POA: Diagnosis not present

## 2022-11-12 DIAGNOSIS — M1990 Unspecified osteoarthritis, unspecified site: Secondary | ICD-10-CM | POA: Diagnosis not present

## 2022-11-12 DIAGNOSIS — K5730 Diverticulosis of large intestine without perforation or abscess without bleeding: Secondary | ICD-10-CM | POA: Diagnosis not present

## 2022-11-12 DIAGNOSIS — Z471 Aftercare following joint replacement surgery: Secondary | ICD-10-CM | POA: Diagnosis not present

## 2022-11-12 DIAGNOSIS — M810 Age-related osteoporosis without current pathological fracture: Secondary | ICD-10-CM | POA: Diagnosis not present

## 2022-11-13 ENCOUNTER — Other Ambulatory Visit: Payer: Self-pay | Admitting: Orthopaedic Surgery

## 2022-11-13 DIAGNOSIS — M810 Age-related osteoporosis without current pathological fracture: Secondary | ICD-10-CM | POA: Diagnosis not present

## 2022-11-13 DIAGNOSIS — E559 Vitamin D deficiency, unspecified: Secondary | ICD-10-CM | POA: Diagnosis not present

## 2022-11-13 DIAGNOSIS — M4850XA Collapsed vertebra, not elsewhere classified, site unspecified, initial encounter for fracture: Secondary | ICD-10-CM | POA: Diagnosis not present

## 2022-11-13 MED ORDER — OXYCODONE HCL 5 MG PO TABS: 5.0000 mg | ORAL_TABLET | Freq: Four times a day (QID) | ORAL | 0 refills | Status: DC | PRN

## 2022-11-16 ENCOUNTER — Ambulatory Visit: Payer: Medicare PPO | Admitting: Physical Therapy

## 2022-11-16 ENCOUNTER — Other Ambulatory Visit: Payer: Self-pay

## 2022-11-16 ENCOUNTER — Encounter: Payer: Self-pay | Admitting: Physical Therapy

## 2022-11-16 DIAGNOSIS — M25662 Stiffness of left knee, not elsewhere classified: Secondary | ICD-10-CM

## 2022-11-16 DIAGNOSIS — R600 Localized edema: Secondary | ICD-10-CM | POA: Diagnosis not present

## 2022-11-16 DIAGNOSIS — M25562 Pain in left knee: Secondary | ICD-10-CM

## 2022-11-16 DIAGNOSIS — M6281 Muscle weakness (generalized): Secondary | ICD-10-CM | POA: Diagnosis not present

## 2022-11-16 DIAGNOSIS — R2689 Other abnormalities of gait and mobility: Secondary | ICD-10-CM | POA: Diagnosis not present

## 2022-11-16 DIAGNOSIS — R2681 Unsteadiness on feet: Secondary | ICD-10-CM | POA: Diagnosis not present

## 2022-11-16 NOTE — Therapy (Signed)
OUTPATIENT PHYSICAL THERAPY LOWER EXTREMITY EVALUATION   Patient Name: Lucas Shepherd MRN: [id number] DOB:Jan 06, 1929, 89 y.o., male Today's Date: 11/16/2022  END OF SESSION:  PT End of Session - 11/16/22 1140     Visit Number 1    Number of Visits 22    Date for PT Re-Evaluation 01/22/23    Authorization Type [employer]    Authorization Time Period $20 co-pay    Authorization - Visit Number 1    Authorization - Number of Visits 12    Progress Note Due on Visit 10    PT Start Time 1100    PT Stop Time 1144    PT Time Calculation (min) 44 min    Activity Tolerance Patient tolerated treatment well    Behavior During Therapy WFL for tasks assessed/performed             Past Medical History:  Diagnosis Date   Anemia    related to past gastric ulcers   Arthritis    Blood transfusion    BPH (benign prostatic hyperplasia)    External hemorrhoids    GERD (gastroesophageal reflux disease)    nisseum fundoplication 2010   GI bleed 06/2011   got 3 units blood   Hiatal hernia    History of kidney stones    History of TMJ syndrome    mild   History of urinary frequency    Osteoporosis    Urinary urgency    Past Surgical History:  Procedure Laterality Date   ABDOMINAL SURGERY  1985   tummy tuc   CATARACT EXTRACTION Bilateral    COLONOSCOPY     ESOPHAGOGASTRODUODENOSCOPY  06/12/2011   Procedure: ESOPHAGOGASTRODUODENOSCOPY (EGD);  Surgeon: Yancey Flemings, MD;  Location: [facility];  Service: Endoscopy;  Laterality: N/A;   HARDWARE REMOVAL  02/23/2012   Procedure: HARDWARE REMOVAL;  Surgeon: Wyn Forster., MD;  Location: [facility];  Service: Orthopedics;  Laterality: Left;  biceps tenolysis and screw removal   HERNIA REPAIR  2013   Hiatal Hernia Repair   KNEE ARTHROSCOPY Left 7/13 2017   NISSEN FUNDOPLICATION  2010   ORIF HUMERUS FRACTURE  12/24/2011   Procedure: OPEN REDUCTION INTERNAL FIXATION (ORIF) PROXIMAL HUMERUS FRACTURE;  Surgeon:  Wyn Forster., MD;  Location: [facility];  Service: Orthopedics;  Laterality: Left;  ORIF left humerus   SHOULDER ARTHROSCOPY Right 1954   SHOULDER ARTHROSCOPY Left 05/28/2011   x 2   spine fussion     x2   TOTAL KNEE ARTHROPLASTY Left 10/27/2022   Procedure: LEFT TOTAL KNEE ARTHROPLASTY;  Surgeon: Kathryne Hitch, MD;  Location: [facility];  Service: Orthopedics;  Laterality: Left;   TOTAL SHOULDER ARTHROPLASTY Right 2023   x2   Patient Active Problem List   Diagnosis Date Noted   Status post total left knee replacement 10/27/2022   Unilateral primary osteoarthritis, left knee 03/10/2022   Pain in right shoulder 08/21/2021   Pain in right hip 07/03/2020   Low back pain 07/03/2020   Status post laparoscopic fundoplication 07/24/2011   History of peptic ulcer 07/24/2011   Esophageal stricture 06/26/2011   H/O: upper GI bleed 06/26/2011   Edema extremities 06/26/2011   Chest pain 06/26/2011   Anemia due to blood loss, acute 06/26/2011   Syncope and collapse 06/11/2011   Melena 06/11/2011   Anemia 06/11/2011   Azotemia 06/11/2011   Volume depletion 06/11/2011   Leukocytosis 06/11/2011   DYSPHAGIA 08/02/2009   HIATAL HERNIA  WITH REFLUX 01/30/2009   VITAMIN B12 DEFICIENCY 01/01/2009   BENIGN PROSTATIC HYPERTROPHY, WITH OBSTRUCTION 12/28/2008   FECAL INCONTINENCE 12/28/2008   GERD 12/27/2008   DIVERTICULOSIS, COLON 12/27/2008   RENAL CALCULUS 12/27/2008    PCP: Farris Has, MD  REFERRING PROVIDER: Doneen Poisson, MD  REFERRING DIAG: [phone] (ICD-10-CM) - Status post total left knee replacement   THERAPY DIAG:  Stiffness of left knee, not elsewhere classified  Muscle weakness (generalized)  Acute pain of left knee  Localized edema  Other abnormalities of gait and mobility  Unsteadiness on feet  Rationale for Evaluation and Treatment: Rehabilitation  ONSET DATE: 10/27/2022 Left TKA sg  SUBJECTIVE:   SUBJECTIVE STATEMENT: On  10/27/2022 this 89yo male underwent a left Total Knee Arthroplasty due to arthritic pain limiting function significantly. Patient went to ED on 11/05/2022 with LE swelling & DVT ruled out. He had HHPT until 11/14/2022.  He was diagnosed with right inguinal hernia with consoltation on 11/26/2022.  PERTINENT HISTORY: OA, osteoporosis,  LBP, spine fusion, peptic ulcer, benign prostate hypertrophy with obstruction, diverticulosis, total shoulder arthroplasty X 2 on BUEs, left ORIF humeral fracture,   PAIN:  NPRS scale: this morning 6/10 and over the last week, lowest 2-3/10 and highest 10+/10 Pain location: left knee in joint, proximally where tourniquet was located medially, anteriorly, and laterally.  Pain description: ache Aggravating factors: not taking Oxy,  Relieving factors: meds, elevation, ice  PRECAUTIONS: Fall  WEIGHT BEARING RESTRICTIONS: Yes LLE WBAT  FALLS:  Has patient fallen in last 6 months? No  LIVING ENVIRONMENT: Lives with: lives with their spouse Lives in: House Stairs: single step with 2 grab bars to enter.  Home is 2 story but able to live on first floor.  Has following equipment at home: Single point cane, Walker - 2 wheeled, Grab bars, and built-in shower seat  OCCUPATION: retired.  PLOF: Independent  PATIENT GOALS:  golfing, being active in community.  Next MD visit: 12/09/2022  OBJECTIVE:  DIAGNOSTIC FINDINGS: 10/27/22 post surgery X-ray Postsurgical changes of left total knee arthroplasty. No evidence of immediate hardware complication.  PATIENT SURVEYS:  FOTO intake:  21%  predicted:  44%  COGNITION: Overall cognitive status: WFL    SENSATION: WFL  EDEMA:  LLE: above knee 51.5 cm,  around knee 52.7 cm, below knee 42.3 cm RLE: above knee 44.5 cm,  around knee 44 cm, below knee 37.8 cm  POSTURE: flexed trunk  and weight shift right  PALPATION: Tenderness left knee around joint line, quadriceps muscle belly, ITB  LOWER EXTREMITY ROM:   ROM  Right eval Left eval  Hip flexion    Hip extension    Hip abduction    Hip adduction    Hip internal rotation    Hip external rotation    Knee flexion  Seated A: 81* P: 84*  Knee extension  Seated  LAQ A: -24* Supine Quad set -11* P: -10*  Ankle dorsiflexion    Ankle plantarflexion    Ankle inversion    Ankle eversion     (Blank rows = not tested)  LOWER EXTREMITY MMT:  MMT Right eval Left eval  Hip flexion    Hip extension    Hip abduction    Hip adduction    Hip internal rotation    Hip external rotation    Knee flexion  3-/5  Knee extension  3-/5  Ankle dorsiflexion    Ankle plantarflexion    Ankle inversion    Ankle eversion     (  Blank rows = not tested)  FUNCTIONAL TESTS:  18 inch chair transfer: requires use of armrests to arise and RW to stabilize Lt SLS: Rt SLS:  GAIT: Distance walked: 100'  Assistive device utilized: Environmental consultant - 2 wheeled Level of assistance: SBA Comments: excessive UE weight bearing on RW, left knee flexed in stance with minimal increase for swing. Antalgic with decreased LLE stance duration.    TODAY'S TREATMENT                                                                          DATE: 11/16/2022: Therex:  HEP instruction/performance c cues for techniques, handout provided.  Trial set performed of each for comprehension and symptom assessment.  See below for exercise list PT recommended elevation >/= 2x/day for >/= 15 min.   PATIENT EDUCATION:  Education details: HEP, POC Person educated: Patient Education method: Programmer, multimedia, Demonstration, Verbal cues, and Handouts Education comprehension: verbalized understanding, returned demonstration, and verbal cues required  HOME EXERCISE PROGRAM: Access Code: [email or web address] URL: [email or web address] Date: 11/16/2022 Prepared by: Vladimir Faster  Exercises - Ankle Alphabet in Elevation  - 2-4 x daily - 7 x weekly - 3 sets - 1 reps - Quad Setting and Stretching  - 2-4 x  daily - 7 x weekly - 5-10 sets - 10 reps - 5 hold - Supine Knee Extension Strengthening  - 1 x daily - 5 x weekly - 1 sets - 10 reps - 5 seconds hold - Supine Heel Slide with Strap  - 2-3 x daily - 7 x weekly - 2-3 sets - 10 reps - 5 seconds hold - Supine Straight Leg Raises  - 2-3 x daily - 7 x weekly - 2-3 sets - 10 reps - 5 seconds hold - Seated Knee Flexion Extension AROM   - 2-4 x daily - 7 x weekly - 2-3 sets - 10 reps - 5 seconds hold - Seated straight leg lifts  - 2-3 x daily - 7 x weekly - 2-3 sets - 10 reps - 5 seconds hold - Seated Hamstring Stretch with Strap  - 2-4 x daily - 7 x weekly - 1 sets - 3 reps - 20-30 seconds hold - Seated Long Arc Quad  - 1 x daily - 5 x weekly - 1 sets - 10 reps - 5 seconds hold  ASSESSMENT: CLINICAL IMPRESSION: Patient is a 89 y.o. who comes to clinic with complaints of left knee pain with mobility, strength and movement coordination deficits that impair their ability to perform usual daily and recreational functional activities without increase difficulty/symptoms at this time.  Patient has increased edema, stiffness and weakness associated with Total Knee Arthroplasty. Patient to benefit from skilled PT services to address impairments and limitations to improve to previous level of function without restriction secondary to condition.   OBJECTIVE IMPAIRMENTS: Abnormal gait, decreased activity tolerance, decreased balance, decreased endurance, decreased knowledge of condition, decreased knowledge of use of DME, decreased mobility, difficulty walking, decreased ROM, decreased strength, increased edema, and pain.   ACTIVITY LIMITATIONS: carrying, lifting, bending, sitting, standing, squatting, sleeping, stairs, transfers, and locomotion level  PARTICIPATION LIMITATIONS: meal prep, cleaning, laundry, driving, and community activity  PERSONAL FACTORS: Age, Fitness, Time since onset of injury/illness/exacerbation, and  1-2 comorbidities: see PMH  are also  affecting patient's functional outcome.   REHAB POTENTIAL: Good  CLINICAL DECISION MAKING: Stable/uncomplicated  EVALUATION COMPLEXITY: Low   GOALS: Goals reviewed with patient? Yes  SHORT TERM GOALS: (target date for Short term goals 12/17/2022)   1.  Patient will demonstrate independent use of home exercise program to maintain progress from in clinic treatments.  Goal status: New  2. PROM left knee ext -2* and flexion 100*  LONG TERM GOALS: (target dates for all long term goals  01/22/2023 )   1. Patient will demonstrate/report pain at worst less than or equal to 2/10 to facilitate minimal limitation in daily activity secondary to pain symptoms.  Goal status: New   2. Patient will demonstrate independent use of home exercise program to facilitate ability to maintain/progress functional gains from skilled physical therapy services.  Goal status: New   3. Patient will demonstrate FOTO outcome > or = 44 % to indicate reduced disability due to condition.  Goal status: New   4.  Patient will demonstrate left LE MMT 5/5 throughout to faciltiate usual transfers, stairs, squatting at [facility] for daily life.   Goal status: New   5.  Patient ambulates with cane >300' and negotiates ramps / curbs independently.  Goal status: New   6.  left Knee PROM 0* to 105* Goal status: New   7.  left knee AROM ext 0* and flexion 100* Goal Status: New  PLAN:  PT FREQUENCY:  2-3x/week  PT DURATION: 10 weeks  PLANNED INTERVENTIONS: Therapeutic exercises, Therapeutic activity, Neuro Muscular re-education, Balance training, Gait training, Patient/Family education, Joint mobilization, Stair training, DME instructions, Dry Needling, Electrical stimulation, Traction, Cryotherapy, vasopneumatic deviceMoist heat, Taping, Ultrasound, Ionotophoresis 4mg /ml Dexamethasone, and aquatic therapy, Manual therapy.  All included unless contraindicated  PLAN FOR NEXT SESSION: Review HEP knowledge/results.   Exercise & manual therapy for range. Vaso for edema to end session.    Referring diagnosis? W09.811 (ICD-10-CM) - Status post total left knee replacement  Treatment diagnosis? (if different than referring diagnosis)                       1.   Stiffness of left knee, not elsewhere classified       ICD-10-CM: M25.662         2.   Muscle weakness (generalized)       ICD-10-CM: M62.81         3.   Acute pain of left knee       ICD-10-CM: M25.562         4.   Localized edema       ICD-10-CM: R60.0         5.   Other abnormalities of gait and mobility       ICD-10-CM: R26.89        6.   Unsteadiness on feet       ICD-10-CM: R26.81      What was this (referring dx) caused by? [x]  Surgery []  Fall []  Ongoing issue []  Arthritis []  Other: ____________  Laterality: []  Rt [x]  Lt []  Both  Check all possible CPT codes:  *CHOOSE 10 OR LESS*    [x]  97110 (Therapeutic Exercise)  []  92507 (SLP Treatment)  [x]  97112 (Neuro Re-ed)   []  92526 (Swallowing Treatment)   [x]  97116 (Gait Training)   []  [id number] (Cognitive Training, 1st 15 minutes) [x]  97140 (Manual Therapy)   []  97130 (Cognitive Training, each add'l 15 minutes)  [  x] [phone] (Re-evaluation)                              []  Other, List CPT Code ____________  [x]  97530 (Therapeutic Activities)     [x]  97535 (Self Care)   [x]  All codes above (97110 - 97535)  []  19147 (Mechanical Traction)  []  97014 (E-stim Unattended)  [x]  97032 (E-stim manual)  []  97033 (Ionto)  []  97035 (Ultrasound) []  97750 (Physical Performance Training) []  [id number] (Aquatic Therapy) [x]  97016 (Vasopneumatic Device) []  [id number] (Paraffin) []  97034 (Contrast Bath) []  97597 (Wound Care 1st 20 sq cm) []  97598 (Wound Care each add'l 20 sq cm) []  97760 (Orthotic Fabrication, Fitting, Training Initial) []  [id number] (Prosthetic Management and Training Initial) []  [id number] (Orthotic or Prosthetic Training/ Modification Subsequent)    Vladimir Faster, PT,  DPT 11/16/2022, 4:12 PM

## 2022-11-17 ENCOUNTER — Encounter: Payer: Self-pay | Admitting: Physical Therapy

## 2022-11-17 ENCOUNTER — Ambulatory Visit: Payer: Medicare PPO | Admitting: Physical Therapy

## 2022-11-17 DIAGNOSIS — R2681 Unsteadiness on feet: Secondary | ICD-10-CM

## 2022-11-17 DIAGNOSIS — R600 Localized edema: Secondary | ICD-10-CM | POA: Diagnosis not present

## 2022-11-17 DIAGNOSIS — R2689 Other abnormalities of gait and mobility: Secondary | ICD-10-CM

## 2022-11-17 DIAGNOSIS — M6281 Muscle weakness (generalized): Secondary | ICD-10-CM

## 2022-11-17 DIAGNOSIS — M25562 Pain in left knee: Secondary | ICD-10-CM | POA: Diagnosis not present

## 2022-11-17 DIAGNOSIS — M25662 Stiffness of left knee, not elsewhere classified: Secondary | ICD-10-CM | POA: Diagnosis not present

## 2022-11-17 NOTE — Therapy (Signed)
OUTPATIENT PHYSICAL THERAPY LOWER EXTREMITY TREATMENT   Patient Name: Lucas Shepherd MRN: [id number] DOB:02-May-1929, 89 y.o., male Today's Date: 11/17/2022  END OF SESSION:  PT End of Session - 11/17/22 1259     Visit Number 2    Number of Visits 22    Date for PT Re-Evaluation 01/22/23    Authorization Type [employer]    Authorization Time Period $20 co-pay    Authorization - Visit Number 2    Authorization - Number of Visits 12    Progress Note Due on Visit 10    PT Start Time 1300    PT Stop Time 1350    PT Time Calculation (min) 50 min    Activity Tolerance Patient tolerated treatment well    Behavior During Therapy WFL for tasks assessed/performed              Past Medical History:  Diagnosis Date   Anemia    related to past gastric ulcers   Arthritis    Blood transfusion    BPH (benign prostatic hyperplasia)    External hemorrhoids    GERD (gastroesophageal reflux disease)    nisseum fundoplication 2010   GI bleed 06/2011   got 3 units blood   Hiatal hernia    History of kidney stones    History of TMJ syndrome    mild   History of urinary frequency    Osteoporosis    Urinary urgency    Past Surgical History:  Procedure Laterality Date   ABDOMINAL SURGERY  1985   tummy tuc   CATARACT EXTRACTION Bilateral    COLONOSCOPY     ESOPHAGOGASTRODUODENOSCOPY  06/12/2011   Procedure: ESOPHAGOGASTRODUODENOSCOPY (EGD);  Surgeon: Yancey Flemings, MD;  Location: [facility];  Service: Endoscopy;  Laterality: N/A;   HARDWARE REMOVAL  02/23/2012   Procedure: HARDWARE REMOVAL;  Surgeon: Wyn Forster., MD;  Location: [facility];  Service: Orthopedics;  Laterality: Left;  biceps tenolysis and screw removal   HERNIA REPAIR  2013   Hiatal Hernia Repair   KNEE ARTHROSCOPY Left 7/13 2017   NISSEN FUNDOPLICATION  2010   ORIF HUMERUS FRACTURE  12/24/2011   Procedure: OPEN REDUCTION INTERNAL FIXATION (ORIF) PROXIMAL HUMERUS FRACTURE;  Surgeon:  Wyn Forster., MD;  Location: [facility];  Service: Orthopedics;  Laterality: Left;  ORIF left humerus   SHOULDER ARTHROSCOPY Right 1954   SHOULDER ARTHROSCOPY Left 05/28/2011   x 2   spine fussion     x2   TOTAL KNEE ARTHROPLASTY Left 10/27/2022   Procedure: LEFT TOTAL KNEE ARTHROPLASTY;  Surgeon: Kathryne Hitch, MD;  Location: [facility];  Service: Orthopedics;  Laterality: Left;   TOTAL SHOULDER ARTHROPLASTY Right 2023   x2   Patient Active Problem List   Diagnosis Date Noted   Status post total left knee replacement 10/27/2022   Unilateral primary osteoarthritis, left knee 03/10/2022   Pain in right shoulder 08/21/2021   Pain in right hip 07/03/2020   Low back pain 07/03/2020   Status post laparoscopic fundoplication 07/24/2011   History of peptic ulcer 07/24/2011   Esophageal stricture 06/26/2011   H/O: upper GI bleed 06/26/2011   Edema extremities 06/26/2011   Chest pain 06/26/2011   Anemia due to blood loss, acute 06/26/2011   Syncope and collapse 06/11/2011   Melena 06/11/2011   Anemia 06/11/2011   Azotemia 06/11/2011   Volume depletion 06/11/2011   Leukocytosis 06/11/2011   DYSPHAGIA 08/02/2009   HIATAL  HERNIA WITH REFLUX 01/30/2009   VITAMIN B12 DEFICIENCY 01/01/2009   BENIGN PROSTATIC HYPERTROPHY, WITH OBSTRUCTION 12/28/2008   FECAL INCONTINENCE 12/28/2008   GERD 12/27/2008   DIVERTICULOSIS, COLON 12/27/2008   RENAL CALCULUS 12/27/2008    PCP: Farris Has, MD  REFERRING PROVIDER: Doneen Poisson, MD  REFERRING DIAG: [phone] (ICD-10-CM) - Status post total left knee replacement   THERAPY DIAG:  Stiffness of left knee, not elsewhere classified  Muscle weakness (generalized)  Acute pain of left knee  Localized edema  Other abnormalities of gait and mobility  Unsteadiness on feet  Rationale for Evaluation and Treatment: Rehabilitation  ONSET DATE: 10/27/2022 Left TKA sg  SUBJECTIVE:   SUBJECTIVE STATEMENT: He has  done some of exercises after PT evaluation yesterday.  PERTINENT HISTORY: OA, osteoporosis,  LBP, spine fusion, peptic ulcer, benign prostate hypertrophy with obstruction, diverticulosis, total shoulder arthroplasty X 2 on BUEs, left ORIF humeral fracture,   PAIN:  NPRS scale:today 4-5/10 and over the last week, lowest 2-3/10 and highest 10+/10 Pain location: left knee in joint, proximally where tourniquet was located medially, anteriorly, and laterally.  Pain description: ache Aggravating factors: not taking Oxy,  Relieving factors: meds, elevation, ice  PRECAUTIONS: Fall  WEIGHT BEARING RESTRICTIONS: Yes LLE WBAT  FALLS:  Has patient fallen in last 6 months? No  LIVING ENVIRONMENT: Lives with: lives with their spouse Lives in: House Stairs: single step with 2 grab bars to enter.  Home is 2 story but able to live on first floor.  Has following equipment at home: Single point cane, Walker - 2 wheeled, Grab bars, and built-in shower seat  OCCUPATION: retired.  PLOF: Independent  PATIENT GOALS:  golfing, being active in community.  Next MD visit: 12/09/2022  OBJECTIVE:  DIAGNOSTIC FINDINGS: 10/27/22 post surgery X-ray Postsurgical changes of left total knee arthroplasty. No evidence of immediate hardware complication.  PATIENT SURVEYS:  Eval / 11/16/2022: FOTO intake:  21%  predicted:  44%  COGNITION: Overall cognitive status: WFL    SENSATION: WFL  EDEMA:  Eval / 11/16/2022: LLE: above knee 51.5 cm,  around knee 52.7 cm, below knee 42.3 cm RLE: above knee 44.5 cm,  around knee 44 cm, below knee 37.8 cm  POSTURE: Eval / 11/16/2022: flexed trunk  and weight shift right  PALPATION: Eval / 11/16/2022:  Tenderness left knee around joint line, quadriceps muscle belly, ITB  LOWER EXTREMITY ROM:   ROM Left Eval 11/16/22  Hip flexion   Hip extension   Hip abduction   Hip adduction   Hip internal rotation   Hip external rotation   Knee flexion Seated A: 81* P: 84*  Knee  extension Seated  LAQ A: -24* Supine Quad set -11* P: -10*  Ankle dorsiflexion   Ankle plantarflexion   Ankle inversion   Ankle eversion    (Blank rows = not tested)  LOWER EXTREMITY MMT:  MMT Left Eval 11/16/22  Hip flexion   Hip extension   Hip abduction   Hip adduction   Hip internal rotation   Hip external rotation   Knee flexion 3-/5  Knee extension 3-/5  Ankle dorsiflexion   Ankle plantarflexion   Ankle inversion   Ankle eversion    (Blank rows = not tested)  FUNCTIONAL TESTS:  Eval / 11/16/2022: 18 inch chair transfer: requires use of armrests to arise and RW to stabilize  GAIT: Eval / 11/16/2022: Distance walked: 100'  Assistive device utilized: Environmental consultant - 2 wheeled Level of assistance: SBA Comments: excessive UE weight  bearing on RW, left knee flexed in stance with minimal increase for swing. Antalgic with decreased LLE stance duration.    TODAY'S TREATMENT                                                                          DATE: 11/17/2022: Therapeutic Exercise: Nustep seat 12 level 5 with BLEs / BUEs for 8 min Seated LAQ & active knee flexion with contralateral LE opposing motion 2 min Gastroc stretch step heel depression 30 sec 2 reps Standing heel raises BUE light support RW 15 reps 5 sec hold Hamstring stretch LLE long sit with strap DF 30 sec hold 2 reps Bridge 5 sec hold 10 reps Quad set supine with PT AA 5 sec hold 15 reps Leg press manual resistance 15 reps 5 sec hold Quad stretch hooklying LLE over edge with strap knee flexion 30 sec hold 2 reps Heel slide with lower leg on ball strap assist 5 sec hold flexion & ext 10 reps  Manual Therapy PROM with overpressure & contract-relax for left knee flexion & ext  Vaso LLE with dual hose ankle & knee for whole leg edema management medium compression 34* 10 min with elevation   Eval / 11/16/2022: Therex:  HEP instruction/performance c cues for techniques, handout provided.  Trial set performed of  each for comprehension and symptom assessment.  See below for exercise list PT recommended elevation >/= 2x/day for >/= 15 min.   PATIENT EDUCATION:  Education details: HEP, POC Person educated: Patient Education method: Programmer, multimedia, Demonstration, Verbal cues, and Handouts Education comprehension: verbalized understanding, returned demonstration, and verbal cues required  HOME EXERCISE PROGRAM: Access Code: [email or web address] URL: [email or web address] Date: 11/16/2022 Prepared by: Vladimir Faster  Exercises - Ankle Alphabet in Elevation  - 2-4 x daily - 7 x weekly - 3 sets - 1 reps - Quad Setting and Stretching  - 2-4 x daily - 7 x weekly - 5-10 sets - 10 reps - 5 hold - Supine Knee Extension Strengthening  - 1 x daily - 5 x weekly - 1 sets - 10 reps - 5 seconds hold - Supine Heel Slide with Strap  - 2-3 x daily - 7 x weekly - 2-3 sets - 10 reps - 5 seconds hold - Supine Straight Leg Raises  - 2-3 x daily - 7 x weekly - 2-3 sets - 10 reps - 5 seconds hold - Seated Knee Flexion Extension AROM   - 2-4 x daily - 7 x weekly - 2-3 sets - 10 reps - 5 seconds hold - Seated straight leg lifts  - 2-3 x daily - 7 x weekly - 2-3 sets - 10 reps - 5 seconds hold - Seated Hamstring Stretch with Strap  - 2-4 x daily - 7 x weekly - 1 sets - 3 reps - 20-30 seconds hold - Seated Long Arc Quad  - 1 x daily - 5 x weekly - 1 sets - 10 reps - 5 seconds hold  ASSESSMENT: CLINICAL IMPRESSION: Manual therapy and exercises including muscle stretches appeared to increase functional range for both flexion & extension.  PT continues to modify exercises to limit right inguinal hernia strain.   OBJECTIVE IMPAIRMENTS: Abnormal gait, decreased activity tolerance, decreased  balance, decreased endurance, decreased knowledge of condition, decreased knowledge of use of DME, decreased mobility, difficulty walking, decreased ROM, decreased strength, increased edema, and pain.   ACTIVITY LIMITATIONS: carrying, lifting,  bending, sitting, standing, squatting, sleeping, stairs, transfers, and locomotion level  PARTICIPATION LIMITATIONS: meal prep, cleaning, laundry, driving, and community activity  PERSONAL FACTORS: Age, Fitness, Time since onset of injury/illness/exacerbation, and 1-2 comorbidities: see PMH  are also affecting patient's functional outcome.   REHAB POTENTIAL: Good  CLINICAL DECISION MAKING: Stable/uncomplicated  EVALUATION COMPLEXITY: Low   GOALS: Goals reviewed with patient? Yes  SHORT TERM GOALS: (target date for Short term goals 12/17/2022)   1.  Patient will demonstrate independent use of home exercise program to maintain progress from in clinic treatments.  Goal status: ongoing 11/17/2022  2. PROM left knee ext -2* and flexion 100*  Goal status: ongoing 11/17/2022  LONG TERM GOALS: (target dates for all long term goals  01/22/2023 )   1. Patient will demonstrate/report pain at worst less than or equal to 2/10 to facilitate minimal limitation in daily activity secondary to pain symptoms.  Goal status: ongoing 11/17/2022   2. Patient will demonstrate independent use of home exercise program to facilitate ability to maintain/progress functional gains from skilled physical therapy services.  Goal status: ongoing 11/17/2022   3. Patient will demonstrate FOTO outcome > or = 44 % to indicate reduced disability due to condition.  Goal status: ongoing 11/17/2022   4.  Patient will demonstrate left LE MMT 5/5 throughout to faciltiate usual transfers, stairs, squatting at [facility] for daily life.   Goal status:  ongoing 11/17/2022   5.  Patient ambulates with cane >300' and negotiates ramps / curbs independently.  Goal status: ongoing 11/17/2022   6.  left Knee PROM 0* to 105* Goal status: ongoing 11/17/2022   7.  left knee AROM ext 0* and flexion 100* Goal Status: ongoing 11/17/2022  PLAN:  PT FREQUENCY:  2-3x/week  PT DURATION: 10 weeks  PLANNED INTERVENTIONS: Therapeutic exercises,  Therapeutic activity, Neuro Muscular re-education, Balance training, Gait training, Patient/Family education, Joint mobilization, Stair training, DME instructions, Dry Needling, Electrical stimulation, Traction, Cryotherapy, vasopneumatic deviceMoist heat, Taping, Ultrasound, Ionotophoresis 4mg /ml Dexamethasone, and aquatic therapy, Manual therapy.  All included unless contraindicated  PLAN FOR NEXT SESSION: continue Exercise & manual therapy for range. Be aware of right inguinal hernia with exercises.  Add muscle stretches to HEP. Vaso for edema to end session.    Vladimir Faster, PT, DPT 11/17/2022, 1:45 PM

## 2022-11-19 ENCOUNTER — Ambulatory Visit: Payer: Medicare PPO | Admitting: Rehabilitative and Restorative Service Providers"

## 2022-11-19 ENCOUNTER — Encounter: Payer: Self-pay | Admitting: Rehabilitative and Restorative Service Providers"

## 2022-11-19 ENCOUNTER — Encounter: Payer: Medicare PPO | Admitting: Physical Therapy

## 2022-11-19 DIAGNOSIS — M25662 Stiffness of left knee, not elsewhere classified: Secondary | ICD-10-CM | POA: Diagnosis not present

## 2022-11-19 DIAGNOSIS — M25562 Pain in left knee: Secondary | ICD-10-CM | POA: Diagnosis not present

## 2022-11-19 DIAGNOSIS — R600 Localized edema: Secondary | ICD-10-CM

## 2022-11-19 DIAGNOSIS — R2681 Unsteadiness on feet: Secondary | ICD-10-CM

## 2022-11-19 DIAGNOSIS — M6281 Muscle weakness (generalized): Secondary | ICD-10-CM | POA: Diagnosis not present

## 2022-11-19 DIAGNOSIS — R2689 Other abnormalities of gait and mobility: Secondary | ICD-10-CM | POA: Diagnosis not present

## 2022-11-19 NOTE — Therapy (Signed)
OUTPATIENT PHYSICAL THERAPY LOWER EXTREMITY TREATMENT   Patient Name: Lucas Shepherd MRN: [id number] DOB:1928/08/27, 89 y.o., male Today's Date: 11/19/2022  END OF SESSION:  PT End of Session - 11/19/22 1336     Visit Number 3    Number of Visits 22    Date for PT Re-Evaluation 01/22/23    Authorization Type [employer]    Authorization Time Period $20 COPAY Approved  Authorization #[id number]  Tracking #[id number]  12 PT VISITS 5/6-7/12/24    Authorization - Visit Number 3    Authorization - Number of Visits 12    Progress Note Due on Visit 10    PT Start Time 1335    PT Stop Time 1431    PT Time Calculation (min) 56 min    Activity Tolerance Patient tolerated treatment well;No increased pain;Patient limited by pain    Behavior During Therapy [facility] for tasks assessed/performed             Past Medical History:  Diagnosis Date   Anemia    related to past gastric ulcers   Arthritis    Blood transfusion    BPH (benign prostatic hyperplasia)    External hemorrhoids    GERD (gastroesophageal reflux disease)    nisseum fundoplication 2010   GI bleed 06/2011   got 3 units blood   Hiatal hernia    History of kidney stones    History of TMJ syndrome    mild   History of urinary frequency    Osteoporosis    Urinary urgency    Past Surgical History:  Procedure Laterality Date   ABDOMINAL SURGERY  1985   tummy tuc   CATARACT EXTRACTION Bilateral    COLONOSCOPY     ESOPHAGOGASTRODUODENOSCOPY  06/12/2011   Procedure: ESOPHAGOGASTRODUODENOSCOPY (EGD);  Surgeon: Yancey Flemings, MD;  Location: [facility];  Service: Endoscopy;  Laterality: N/A;   HARDWARE REMOVAL  02/23/2012   Procedure: HARDWARE REMOVAL;  Surgeon: Wyn Forster., MD;  Location: [facility];  Service: Orthopedics;  Laterality: Left;  biceps tenolysis and screw removal   HERNIA REPAIR  2013   Hiatal Hernia Repair   KNEE ARTHROSCOPY Left 7/13 2017   NISSEN FUNDOPLICATION  2010   ORIF  HUMERUS FRACTURE  12/24/2011   Procedure: OPEN REDUCTION INTERNAL FIXATION (ORIF) PROXIMAL HUMERUS FRACTURE;  Surgeon: Wyn Forster., MD;  Location: [facility];  Service: Orthopedics;  Laterality: Left;  ORIF left humerus   SHOULDER ARTHROSCOPY Right 1954   SHOULDER ARTHROSCOPY Left 05/28/2011   x 2   spine fussion     x2   TOTAL KNEE ARTHROPLASTY Left 10/27/2022   Procedure: LEFT TOTAL KNEE ARTHROPLASTY;  Surgeon: Kathryne Hitch, MD;  Location: [facility];  Service: Orthopedics;  Laterality: Left;   TOTAL SHOULDER ARTHROPLASTY Right 2023   x2   Patient Active Problem List   Diagnosis Date Noted   Status post total left knee replacement 10/27/2022   Unilateral primary osteoarthritis, left knee 03/10/2022   Pain in right shoulder 08/21/2021   Pain in right hip 07/03/2020   Low back pain 07/03/2020   Status post laparoscopic fundoplication 07/24/2011   History of peptic ulcer 07/24/2011   Esophageal stricture 06/26/2011   H/O: upper GI bleed 06/26/2011   Edema extremities 06/26/2011   Chest pain 06/26/2011   Anemia due to blood loss, acute 06/26/2011   Syncope and collapse 06/11/2011   Melena 06/11/2011   Anemia 06/11/2011   Azotemia 06/11/2011  Volume depletion 06/11/2011   Leukocytosis 06/11/2011   DYSPHAGIA 08/02/2009   HIATAL HERNIA WITH REFLUX 01/30/2009   VITAMIN B12 DEFICIENCY 01/01/2009   BENIGN PROSTATIC HYPERTROPHY, WITH OBSTRUCTION 12/28/2008   FECAL INCONTINENCE 12/28/2008   GERD 12/27/2008   DIVERTICULOSIS, COLON 12/27/2008   RENAL CALCULUS 12/27/2008    PCP: Farris Has, MD  REFERRING PROVIDER: Doneen Poisson, MD  REFERRING DIAG: [phone] (ICD-10-CM) - Status post total left knee replacement   THERAPY DIAG:  Stiffness of left knee, not elsewhere classified  Muscle weakness (generalized)  Acute pain of left knee  Localized edema  Other abnormalities of gait and mobility  Unsteadiness on feet  Rationale for  Evaluation and Treatment: Rehabilitation  ONSET DATE: 10/27/2022 Left TKA sg  SUBJECTIVE:   SUBJECTIVE STATEMENT:   PERTINENT HISTORY: OA, osteoporosis,  LBP, spine fusion, peptic ulcer, benign prostate hypertrophy with obstruction, diverticulosis, total shoulder arthroplasty X 2 on BUEs, left ORIF humeral fracture,   PAIN:  NPRS scale: 3-9/10 this week Pain location: left knee in joint, proximally where tourniquet was located medially, anteriorly, and laterally.  Pain description: ache Aggravating factors: not taking Oxy,  Relieving factors: meds, elevation, ice  PRECAUTIONS: Fall  WEIGHT BEARING RESTRICTIONS: Yes LLE WBAT  FALLS:  Has patient fallen in last 6 months? No  LIVING ENVIRONMENT: Lives with: lives with their spouse Lives in: House Stairs: single step with 2 grab bars to enter.  Home is 2 story but able to live on first floor.  Has following equipment at home: Single point cane, Walker - 2 wheeled, Grab bars, and built-in shower seat  OCCUPATION: retired.  PLOF: Independent  PATIENT GOALS:  golfing, being active in community.  Next MD visit: 12/09/2022  OBJECTIVE:  DIAGNOSTIC FINDINGS: 10/27/22 post surgery X-ray Postsurgical changes of left total knee arthroplasty. No evidence of immediate hardware complication.  PATIENT SURVEYS:  Eval / 11/16/2022: FOTO intake:  21%  predicted:  44%  COGNITION: Overall cognitive status: WFL    SENSATION: WFL  EDEMA:  Eval / 11/16/2022: LLE: above knee 51.5 cm,  around knee 52.7 cm, below knee 42.3 cm RLE: above knee 44.5 cm,  around knee 44 cm, below knee 37.8 cm  POSTURE: Eval / 11/16/2022: flexed trunk  and weight shift right  PALPATION: Eval / 11/16/2022:  Tenderness left knee around joint line, quadriceps muscle belly, ITB  LOWER EXTREMITY ROM:   ROM Left Eval 11/16/22 Left 11/19/2022  Hip flexion    Hip extension    Hip abduction    Hip adduction    Hip internal rotation    Hip external rotation    Knee  flexion Seated A: 81* P: 84* Active assisted (with belt) 97  Knee extension Seated  LAQ A: -24* Supine Quad set -11* P: -10* Active -7  Ankle dorsiflexion    Ankle plantarflexion    Ankle inversion    Ankle eversion     (Blank rows = not tested)  LOWER EXTREMITY MMT:  MMT Left Eval 11/16/22  Hip flexion   Hip extension   Hip abduction   Hip adduction   Hip internal rotation   Hip external rotation   Knee flexion 3-/5  Knee extension 3-/5  Ankle dorsiflexion   Ankle plantarflexion   Ankle inversion   Ankle eversion    (Blank rows = not tested)  FUNCTIONAL TESTS:  Eval / 11/16/2022: 18 inch chair transfer: requires use of armrests to arise and RW to stabilize  GAIT: Eval / 11/16/2022: Distance walked: 100'  Assistive device utilized: Environmental consultant - 2 wheeled Level of assistance: SBA Comments: excessive UE weight bearing on RW, left knee flexed in stance with minimal increase for swing. Antalgic with decreased LLE stance duration.    TODAY'S TREATMENT                                                                          DATE: 11/19/2022: Quadriceps sets 2 sets of 10 with left heel prop and 1 set of 10 in between knee flexion with strap 5 seconds Knee flexion with strap 10X 10 seconds Seated knee flexion with PT overpressure 10X 10 seconds Tailgate knee flexion 1 minute  Functional Activities: Double Leg Press: 62# 15X slow eccentrics stretch into flexion and extension Single Leg Press: 25# 15X slow eccentrics stretch into flexion and extension  Vaso left knee 10 minutes Medium Pressure 34*   11/17/2022: Therapeutic Exercise: Nustep seat 12 level 5 with BLEs / BUEs for 8 min Seated LAQ & active knee flexion with contralateral LE opposing motion 2 min Gastroc stretch step heel depression 30 sec 2 reps Standing heel raises BUE light support RW 15 reps 5 sec hold Hamstring stretch LLE long sit with strap DF 30 sec hold 2 reps Bridge 5 sec hold 10 reps Quad set supine  with PT AA 5 sec hold 15 reps Leg press manual resistance 15 reps 5 sec hold Quad stretch hooklying LLE over edge with strap knee flexion 30 sec hold 2 reps Heel slide with lower leg on ball strap assist 5 sec hold flexion & ext 10 reps  Manual Therapy PROM with overpressure & contract-relax for left knee flexion & ext  Vaso LLE with dual hose ankle & knee for whole leg edema management medium compression 34* 10 min with elevation   Eval / 11/16/2022: Therex:  HEP instruction/performance c cues for techniques, handout provided.  Trial set performed of each for comprehension and symptom assessment.  See below for exercise list PT recommended elevation >/= 2x/day for >/= 15 min.   PATIENT EDUCATION:  Education details: HEP, POC Person educated: Patient Education method: Programmer, multimedia, Demonstration, Verbal cues, and Handouts Education comprehension: verbalized understanding, returned demonstration, and verbal cues required  HOME EXERCISE PROGRAM: Access Code: [email or web address] URL: [email or web address] Date: 11/16/2022 Prepared by: Vladimir Faster  Exercises - Ankle Alphabet in Elevation  - 2-4 x daily - 7 x weekly - 3 sets - 1 reps - Quad Setting and Stretching  - 2-4 x daily - 7 x weekly - 5-10 sets - 10 reps - 5 hold - Supine Knee Extension Strengthening  - 1 x daily - 5 x weekly - 1 sets - 10 reps - 5 seconds hold - Supine Heel Slide with Strap  - 2-3 x daily - 7 x weekly - 2-3 sets - 10 reps - 5 seconds hold - Supine Straight Leg Raises  - 2-3 x daily - 7 x weekly - 2-3 sets - 10 reps - 5 seconds hold - Seated Knee Flexion Extension AROM   - 2-4 x daily - 7 x weekly - 2-3 sets - 10 reps - 5 seconds hold - Seated straight leg lifts  - 2-3 x daily - 7 x weekly - 2-3 sets -  10 reps - 5 seconds hold - Seated Hamstring Stretch with Strap  - 2-4 x daily - 7 x weekly - 1 sets - 3 reps - 20-30 seconds hold - Seated Long Arc Quad  - 1 x daily - 5 x weekly - 1 sets - 10 reps - 5 seconds  hold  ASSESSMENT: CLINICAL IMPRESSION: Ed had 7 - 0 - 97 degrees of ROM today.  We reviewed the importance of HEP consistency with emphasis on knee extension and flexion AROM, quadriceps strength and edema control.  Continue current POC to meet LTGs.  OBJECTIVE IMPAIRMENTS: Abnormal gait, decreased activity tolerance, decreased balance, decreased endurance, decreased knowledge of condition, decreased knowledge of use of DME, decreased mobility, difficulty walking, decreased ROM, decreased strength, increased edema, and pain.   ACTIVITY LIMITATIONS: carrying, lifting, bending, sitting, standing, squatting, sleeping, stairs, transfers, and locomotion level  PARTICIPATION LIMITATIONS: meal prep, cleaning, laundry, driving, and community activity  PERSONAL FACTORS: Age, Fitness, Time since onset of injury/illness/exacerbation, and 1-2 comorbidities: see PMH  are also affecting patient's functional outcome.   REHAB POTENTIAL: Good  CLINICAL DECISION MAKING: Stable/uncomplicated  EVALUATION COMPLEXITY: Low   GOALS: Goals reviewed with patient? Yes  SHORT TERM GOALS: (target date for Short term goals 12/17/2022)   1.  Patient will demonstrate independent use of home exercise program to maintain progress from in clinic treatments.  Goal status: ongoing 11/19/2022  2. PROM left knee ext -2* and flexion 100*  Goal status: ongoing 11/19/2022  LONG TERM GOALS: (target dates for all long term goals  01/22/2023 )   1. Patient will demonstrate/report pain at worst less than or equal to 2/10 to facilitate minimal limitation in daily activity secondary to pain symptoms.  Goal status: ongoing 11/19/2022   2. Patient will demonstrate independent use of home exercise program to facilitate ability to maintain/progress functional gains from skilled physical therapy services.  Goal status: ongoing 11/19/2022   3. Patient will demonstrate FOTO outcome > or = 44 % to indicate reduced disability due to  condition.  Goal status: ongoing 11/17/2022   4.  Patient will demonstrate left LE MMT 5/5 throughout to faciltiate usual transfers, stairs, squatting at [facility] for daily life.   Goal status:  ongoing 11/17/2022   5.  Patient ambulates with cane >300' and negotiates ramps / curbs independently.  Goal status: ongoing 11/17/2022   6.  left Knee PROM 0* to 105* Goal status: ongoing 11/19/2022   7.  left knee AROM ext 0* and flexion 100* Goal Status: ongoing 11/19/2022  PLAN:  PT FREQUENCY:  2-3x/week  PT DURATION: 10 weeks  PLANNED INTERVENTIONS: Therapeutic exercises, Therapeutic activity, Neuro Muscular re-education, Balance training, Gait training, Patient/Family education, Joint mobilization, Stair training, DME instructions, Dry Needling, Electrical stimulation, Traction, Cryotherapy, vasopneumatic deviceMoist heat, Taping, Ultrasound, Ionotophoresis 4mg /ml Dexamethasone, and aquatic therapy, Manual therapy.  All included unless contraindicated  PLAN FOR NEXT SESSION: Continue Exercise & manual therapy for range. Be aware of right inguinal hernia with exercises. Add muscle stretches to HEP. Vaso for edema to end session.    Cherlyn Cushing, PT, MPT 11/19/2022, 2:27 PM

## 2022-11-23 ENCOUNTER — Ambulatory Visit: Payer: Medicare PPO | Admitting: Physical Therapy

## 2022-11-23 ENCOUNTER — Encounter: Payer: Self-pay | Admitting: Physical Therapy

## 2022-11-23 DIAGNOSIS — R600 Localized edema: Secondary | ICD-10-CM | POA: Diagnosis not present

## 2022-11-23 DIAGNOSIS — R2681 Unsteadiness on feet: Secondary | ICD-10-CM | POA: Diagnosis not present

## 2022-11-23 DIAGNOSIS — M6281 Muscle weakness (generalized): Secondary | ICD-10-CM | POA: Diagnosis not present

## 2022-11-23 DIAGNOSIS — M25562 Pain in left knee: Secondary | ICD-10-CM | POA: Diagnosis not present

## 2022-11-23 DIAGNOSIS — R2689 Other abnormalities of gait and mobility: Secondary | ICD-10-CM

## 2022-11-23 DIAGNOSIS — M25662 Stiffness of left knee, not elsewhere classified: Secondary | ICD-10-CM | POA: Diagnosis not present

## 2022-11-23 NOTE — Therapy (Signed)
OUTPATIENT PHYSICAL THERAPY LOWER EXTREMITY TREATMENT   Patient Name: Lucas Shepherd MRN: [id number] DOB:02/26/1929, 89 y.o., male Today's Date: 11/23/2022  END OF SESSION:  PT End of Session - 11/23/22 1429     Visit Number 4    Number of Visits 22    Date for PT Re-Evaluation 01/22/23    Authorization Type [employer]    Authorization Time Period $20 COPAY Approved  Authorization #[id number]  Tracking #[id number]  12 PT VISITS 5/6-7/12/24    Authorization - Visit Number 4    Authorization - Number of Visits 12    Progress Note Due on Visit 10    PT Start Time 1429    PT Stop Time 1523    PT Time Calculation (min) 54 min    Activity Tolerance Patient tolerated treatment well;No increased pain;Patient limited by pain    Behavior During Therapy [facility] for tasks assessed/performed             Past Medical History:  Diagnosis Date   Anemia    related to past gastric ulcers   Arthritis    Blood transfusion    BPH (benign prostatic hyperplasia)    External hemorrhoids    GERD (gastroesophageal reflux disease)    nisseum fundoplication 2010   GI bleed 06/2011   got 3 units blood   Hiatal hernia    History of kidney stones    History of TMJ syndrome    mild   History of urinary frequency    Osteoporosis    Urinary urgency    Past Surgical History:  Procedure Laterality Date   ABDOMINAL SURGERY  1985   tummy tuc   CATARACT EXTRACTION Bilateral    COLONOSCOPY     ESOPHAGOGASTRODUODENOSCOPY  06/12/2011   Procedure: ESOPHAGOGASTRODUODENOSCOPY (EGD);  Surgeon: Yancey Flemings, MD;  Location: [facility];  Service: Endoscopy;  Laterality: N/A;   HARDWARE REMOVAL  02/23/2012   Procedure: HARDWARE REMOVAL;  Surgeon: Wyn Forster., MD;  Location: [facility];  Service: Orthopedics;  Laterality: Left;  biceps tenolysis and screw removal   HERNIA REPAIR  2013   Hiatal Hernia Repair   KNEE ARTHROSCOPY Left 7/13 2017   NISSEN FUNDOPLICATION  2010   ORIF  HUMERUS FRACTURE  12/24/2011   Procedure: OPEN REDUCTION INTERNAL FIXATION (ORIF) PROXIMAL HUMERUS FRACTURE;  Surgeon: Wyn Forster., MD;  Location: [facility];  Service: Orthopedics;  Laterality: Left;  ORIF left humerus   SHOULDER ARTHROSCOPY Right 1954   SHOULDER ARTHROSCOPY Left 05/28/2011   x 2   spine fussion     x2   TOTAL KNEE ARTHROPLASTY Left 10/27/2022   Procedure: LEFT TOTAL KNEE ARTHROPLASTY;  Surgeon: Kathryne Hitch, MD;  Location: [facility];  Service: Orthopedics;  Laterality: Left;   TOTAL SHOULDER ARTHROPLASTY Right 2023   x2   Patient Active Problem List   Diagnosis Date Noted   Status post total left knee replacement 10/27/2022   Unilateral primary osteoarthritis, left knee 03/10/2022   Pain in right shoulder 08/21/2021   Pain in right hip 07/03/2020   Low back pain 07/03/2020   Status post laparoscopic fundoplication 07/24/2011   History of peptic ulcer 07/24/2011   Esophageal stricture 06/26/2011   H/O: upper GI bleed 06/26/2011   Edema extremities 06/26/2011   Chest pain 06/26/2011   Anemia due to blood loss, acute 06/26/2011   Syncope and collapse 06/11/2011   Melena 06/11/2011   Anemia 06/11/2011   Azotemia 06/11/2011  Volume depletion 06/11/2011   Leukocytosis 06/11/2011   DYSPHAGIA 08/02/2009   HIATAL HERNIA WITH REFLUX 01/30/2009   VITAMIN B12 DEFICIENCY 01/01/2009   BENIGN PROSTATIC HYPERTROPHY, WITH OBSTRUCTION 12/28/2008   FECAL INCONTINENCE 12/28/2008   GERD 12/27/2008   DIVERTICULOSIS, COLON 12/27/2008   RENAL CALCULUS 12/27/2008    PCP: Farris Has, MD  REFERRING PROVIDER: Doneen Poisson, MD  REFERRING DIAG: [phone] (ICD-10-CM) - Status post total left knee replacement   THERAPY DIAG:  Stiffness of left knee, not elsewhere classified  Muscle weakness (generalized)  Acute pain of left knee  Other abnormalities of gait and mobility  Unsteadiness on feet  Localized edema  Rationale for  Evaluation and Treatment: Rehabilitation  ONSET DATE: 10/27/2022 Left TKA sg  SUBJECTIVE:   SUBJECTIVE STATEMENT:  He has been having issues with leg hurting in knee, on thigh where torniquet was placed and ankle at front fold.  He took 2 pills of 5 mg Oxy at 9-930 to sleep then another at 6am to get 1.5 more hours sleep.  He sees the doctor this Thursday regarding his inguinal hernia.   PERTINENT HISTORY: OA, osteoporosis,  LBP, spine fusion, peptic ulcer, benign prostate hypertrophy with obstruction, diverticulosis, total shoulder arthroplasty X 2 on BUEs, left ORIF humeral fracture,   PAIN:  NPRS scale: today 4/10 and 1-8/10 this week Pain location: left knee in joint, proximally where tourniquet was located medially, anteriorly, and laterally.  Pain description: ache Aggravating factors: not taking Oxy,  Relieving factors: meds, elevation, ice  PRECAUTIONS: Fall  WEIGHT BEARING RESTRICTIONS: Yes LLE WBAT  FALLS:  Has patient fallen in last 6 months? No  LIVING ENVIRONMENT: Lives with: lives with their spouse Lives in: House Stairs: single step with 2 grab bars to enter.  Home is 2 story but able to live on first floor.  Has following equipment at home: Single point cane, Walker - 2 wheeled, Grab bars, and built-in shower seat  OCCUPATION: retired.  PLOF: Independent  PATIENT GOALS:  golfing, being active in community.  Next MD visit: 12/09/2022  OBJECTIVE:  DIAGNOSTIC FINDINGS: 10/27/22 post surgery X-ray Postsurgical changes of left total knee arthroplasty. No evidence of immediate hardware complication.  PATIENT SURVEYS:  Eval / 11/16/2022: FOTO intake:  21%  predicted:  44%  COGNITION: Overall cognitive status: WFL    SENSATION: WFL  EDEMA:  Eval / 11/16/2022: LLE: above knee 51.5 cm,  around knee 52.7 cm, below knee 42.3 cm RLE: above knee 44.5 cm,  around knee 44 cm, below knee 37.8 cm  POSTURE: Eval / 11/16/2022: flexed trunk  and weight shift  right  PALPATION: Eval / 11/16/2022:  Tenderness left knee around joint line, quadriceps muscle belly, ITB  LOWER EXTREMITY ROM:   ROM Left Eval 11/16/22 Left 11/19/2022  Hip flexion    Hip extension    Hip abduction    Hip adduction    Hip internal rotation    Hip external rotation    Knee flexion Seated A: 81* P: 84* Active assisted (with belt) 97  Knee extension Seated  LAQ A: -24* Supine Quad set -11* P: -10* Active -7  Ankle dorsiflexion    Ankle plantarflexion    Ankle inversion    Ankle eversion     (Blank rows = not tested)  LOWER EXTREMITY MMT:  MMT Left Eval 11/16/22  Hip flexion   Hip extension   Hip abduction   Hip adduction   Hip internal rotation   Hip external rotation  Knee flexion 3-/5  Knee extension 3-/5  Ankle dorsiflexion   Ankle plantarflexion   Ankle inversion   Ankle eversion    (Blank rows = not tested)  FUNCTIONAL TESTS:  Eval / 11/16/2022: 18 inch chair transfer: requires use of armrests to arise and RW to stabilize  GAIT: Eval / 11/16/2022: Distance walked: 100'  Assistive device utilized: Environmental consultant - 2 wheeled Level of assistance: SBA Comments: excessive UE weight bearing on RW, left knee flexed in stance with minimal increase for swing. Antalgic with decreased LLE stance duration.    TODAY'S TREATMENT                                                                          DATE: 11/23/2022: Therapeutic Exercise: SciFit bike with LEs only level 3 for 8 min. Double Leg Press: 75# 10 reps 2 sets slow stretch into flexion and extension Single Leg Press: 31# 10 reps 2 sets slow stretch into flexion and extension Standing LLE TKE red theraband 10 reps ea position 1st bilateral stance, 2nd set LLE initial contact position & 3rd set LLE terminal stance position. Seated LAQ 3# & active knee flexion with contralateral LE opposing motion 10 reps 2 sets.  Manual Therapy: PROM knee ext during rests on leg press bw sets Knee flexion PROM with  overpressure seated  Gait Training Pt amb 75' X 2 with cane with no balance or knee instability issues. PT recommended initiating cane use in home where flat surfaces & limited distances. Pt verbalized understanding including pain as guide to tolerance for frequency.   Vaso left knee 10 minutes Medium Pressure 34*  11/19/2022: Quadriceps sets 2 sets of 10 with left heel prop and 1 set of 10 in between knee flexion with strap 5 seconds Knee flexion with strap 10X 10 seconds Seated knee flexion with PT overpressure 10X 10 seconds Tailgate knee flexion 1 minute  Functional Activities: Double Leg Press: 62# 15X slow eccentrics stretch into flexion and extension Single Leg Press: 25# 15X slow eccentrics stretch into flexion and extension  Vaso left knee 10 minutes Medium Pressure 34*   11/17/2022: Therapeutic Exercise: Nustep seat 12 level 5 with BLEs / BUEs for 8 min Seated LAQ & active knee flexion with contralateral LE opposing motion 2 min Gastroc stretch step heel depression 30 sec 2 reps Standing heel raises BUE light support RW 15 reps 5 sec hold Hamstring stretch LLE long sit with strap DF 30 sec hold 2 reps Bridge 5 sec hold 10 reps Quad set supine with PT AA 5 sec hold 15 reps Leg press manual resistance 15 reps 5 sec hold Quad stretch hooklying LLE over edge with strap knee flexion 30 sec hold 2 reps Heel slide with lower leg on ball strap assist 5 sec hold flexion & ext 10 reps  Manual Therapy PROM with overpressure & contract-relax for left knee flexion & ext  Vaso LLE with dual hose ankle & knee for whole leg edema management medium compression 34* 10 min with elevation   HOME EXERCISE PROGRAM: Access Code: [email or web address] URL: [email or web address] Date: 11/16/2022 Prepared by: Vladimir Faster  Exercises - Ankle Alphabet in Elevation  - 2-4 x daily - 7 x weekly - 3 sets -  1 reps - Quad Setting and Stretching  - 2-4 x daily - 7 x weekly - 5-10 sets - 10 reps -  5 hold - Supine Knee Extension Strengthening  - 1 x daily - 5 x weekly - 1 sets - 10 reps - 5 seconds hold - Supine Heel Slide with Strap  - 2-3 x daily - 7 x weekly - 2-3 sets - 10 reps - 5 seconds hold - Supine Straight Leg Raises  - 2-3 x daily - 7 x weekly - 2-3 sets - 10 reps - 5 seconds hold - Seated Knee Flexion Extension AROM   - 2-4 x daily - 7 x weekly - 2-3 sets - 10 reps - 5 seconds hold - Seated straight leg lifts  - 2-3 x daily - 7 x weekly - 2-3 sets - 10 reps - 5 seconds hold - Seated Hamstring Stretch with Strap  - 2-4 x daily - 7 x weekly - 1 sets - 3 reps - 20-30 seconds hold - Seated Long Arc Quad  - 1 x daily - 5 x weekly - 1 sets - 10 reps - 5 seconds hold  ASSESSMENT: CLINICAL IMPRESSION: Patient was able to tolerate progression of activities including increased weight on leg press machine.  Patient continues to show improvements in functional strength and appears safe to ambulate household distances with a cane.  This will enable a freehand to carry drinks and other items in the home.  OBJECTIVE IMPAIRMENTS: Abnormal gait, decreased activity tolerance, decreased balance, decreased endurance, decreased knowledge of condition, decreased knowledge of use of DME, decreased mobility, difficulty walking, decreased ROM, decreased strength, increased edema, and pain.   ACTIVITY LIMITATIONS: carrying, lifting, bending, sitting, standing, squatting, sleeping, stairs, transfers, and locomotion level  PARTICIPATION LIMITATIONS: meal prep, cleaning, laundry, driving, and community activity  PERSONAL FACTORS: Age, Fitness, Time since onset of injury/illness/exacerbation, and 1-2 comorbidities: see PMH  are also affecting patient's functional outcome.   REHAB POTENTIAL: Good  CLINICAL DECISION MAKING: Stable/uncomplicated  EVALUATION COMPLEXITY: Low   GOALS: Goals reviewed with patient? Yes  SHORT TERM GOALS: (target date for Short term goals 12/17/2022)   1.  Patient will  demonstrate independent use of home exercise program to maintain progress from in clinic treatments.  Goal status: ongoing 11/19/2022  2. PROM left knee ext -2* and flexion 100*  Goal status: ongoing 11/19/2022  LONG TERM GOALS: (target dates for all long term goals  01/22/2023 )   1. Patient will demonstrate/report pain at worst less than or equal to 2/10 to facilitate minimal limitation in daily activity secondary to pain symptoms.  Goal status: ongoing 11/19/2022   2. Patient will demonstrate independent use of home exercise program to facilitate ability to maintain/progress functional gains from skilled physical therapy services.  Goal status: ongoing 11/19/2022   3. Patient will demonstrate FOTO outcome > or = 44 % to indicate reduced disability due to condition.  Goal status: ongoing 11/17/2022   4.  Patient will demonstrate left LE MMT 5/5 throughout to faciltiate usual transfers, stairs, squatting at [facility] for daily life.   Goal status:  ongoing 11/17/2022   5.  Patient ambulates with cane >300' and negotiates ramps / curbs independently.  Goal status: ongoing 11/17/2022   6.  left Knee PROM 0* to 105* Goal status: ongoing 11/19/2022   7.  left knee AROM ext 0* and flexion 100* Goal Status: ongoing 11/19/2022  PLAN:  PT FREQUENCY:  2-3x/week  PT DURATION: 10 weeks  PLANNED INTERVENTIONS: Therapeutic exercises, Therapeutic activity, Neuro Muscular re-education, Balance training, Gait training, Patient/Family education, Joint mobilization, Stair training, DME instructions, Dry Needling, Electrical stimulation, Traction, Cryotherapy, vasopneumatic deviceMoist heat, Taping, Ultrasound, Ionotophoresis 4mg /ml Dexamethasone, and aquatic therapy, Manual therapy.  All included unless contraindicated  PLAN FOR NEXT SESSION: Check range of motion.  Continue to progress functional exercises & manual therapy for range /strength. Be aware of right inguinal hernia with exercises.  Vaso for edema to  end session.     Vladimir Faster, PT, DPT 11/23/2022, 5:31 PM

## 2022-11-25 ENCOUNTER — Encounter: Payer: Self-pay | Admitting: Orthopaedic Surgery

## 2022-11-25 ENCOUNTER — Encounter: Payer: Medicare PPO | Admitting: Physical Therapy

## 2022-11-25 ENCOUNTER — Other Ambulatory Visit: Payer: Self-pay | Admitting: Orthopaedic Surgery

## 2022-11-26 ENCOUNTER — Encounter: Payer: Self-pay | Admitting: Physical Therapy

## 2022-11-26 ENCOUNTER — Ambulatory Visit: Payer: Self-pay | Admitting: Surgery

## 2022-11-26 ENCOUNTER — Ambulatory Visit: Payer: Medicare PPO | Admitting: Physical Therapy

## 2022-11-26 DIAGNOSIS — R2689 Other abnormalities of gait and mobility: Secondary | ICD-10-CM

## 2022-11-26 DIAGNOSIS — M25662 Stiffness of left knee, not elsewhere classified: Secondary | ICD-10-CM

## 2022-11-26 DIAGNOSIS — M6281 Muscle weakness (generalized): Secondary | ICD-10-CM | POA: Diagnosis not present

## 2022-11-26 DIAGNOSIS — K4090 Unilateral inguinal hernia, without obstruction or gangrene, not specified as recurrent: Secondary | ICD-10-CM | POA: Diagnosis not present

## 2022-11-26 DIAGNOSIS — R2681 Unsteadiness on feet: Secondary | ICD-10-CM | POA: Diagnosis not present

## 2022-11-26 DIAGNOSIS — M25562 Pain in left knee: Secondary | ICD-10-CM

## 2022-11-26 NOTE — H&P (View-Only) (Signed)
   Lucas Shepherd [id number]   Referring Provider:  Morrow, Aaron S, MD   Subjective   Chief Complaint: New Consultation (RT INGUINAL HERNIA)     History of Present Illness:    89-year-old man with history of anemia, arthritis, BPH, hemorrhoids, GERD, hiatal hernia, kidney stones, osteoporosis who presents for evaluation of a right inguinal hernia.  This has been causing on and off burning pain as well as a noticeable protrusion for the last 2 weeks or so.  No associated GI or urinary symptoms.  Previous abdominal surgery includes a tummy tuck in 1985, Nissen in 2010, hiatal hernia repair in 2013.  He just had a left total knee arthroplasty by Dr. Blackman a month ago and has been recovering fairly well from this.  He had a CT scan in 2015; this notes a left hydrocele as well as small bilateral inguinoscrotal hernias containing fat.  I have reviewed the images and I do appreciate a very small right inguinal hernia containing fat but the left is not impressive that time.  Review of Systems: A complete review of systems was obtained from the patient.  I have reviewed this information and discussed as appropriate with the patient.  See HPI as well for other ROS.   Medical History: Past Medical History:  Diagnosis Date   Anemia    Arthritis    GERD (gastroesophageal reflux disease)     There is no problem list on file for this patient.   Past Surgical History:  Procedure Laterality Date   Shoulder arthroscopy (Right)  1954   ABDOMINOPLASTY  1985   Nissen fundoplication  2010   Shoulder arthroscopy (Left)  05/28/2011   Orif humerus fracture  12/24/2011   Knee arthroscopy (Left)  01/23/2016    Total shoulder arthroplasty (Right)   2023    Total knee arthroplasty (Left)   10/27/2022   CATARACT EXTRACTION     HERNIA REPAIR     spine fussion       Allergies  Allergen Reactions   Asprin Ec Low Dose [Aspirin] Other (See Comments)    Previous Bleeding Ulcer   Nsaids  (Non-Steroidal Anti-Inflammatory Drug) Other (See Comments)    Previous Bleeding Ulcer    Current Outpatient Medications on File Prior to Visit  Medication Sig Dispense Refill   collagen, bovine, 100 % Powd Take by mouth     cyanocobalamin (VITAMIN B12) 1000 MCG tablet Take by mouth     denosumab (PROLIA) 60 mg/mL inj syringe      famotidine, bulk, 100 % Powd      finasteride (PROSCAR) 5 mg tablet Take 1 tablet by mouth once daily     gabapentin (NEURONTIN) 100 MG capsule      magnesium oxide 500 mg magnesium Tab 1 tablet after breakfast Orally Once a day     methylcellulose (CITRUCEL) oral powder Take by mouth     multivitamin with minerals (MULTIPLE VITAMIN-MINERALS ORAL) Take by mouth as directed     oxyCODONE (ROXICODONE) 5 MG immediate release tablet Take by mouth     tamsulosin (FLOMAX) 0.4 mg capsule Take 0.4 mg by mouth once daily     turmeric/turmeric ext/pepr ext (TURMERIC-TURMERIC EXT-PEPPER) 500-3 mg Cap 1 capsule after breakfast Orally once a day     No current facility-administered medications on file prior to visit.    Family History  Problem Relation Age of Onset   Nephrolithiasis Mother    Heart defect Father    Lung cancer Brother        Social History   Tobacco Use  Smoking Status Former   Types: Cigarettes  Smokeless Tobacco Never     Social History   Socioeconomic History   Marital status: Married  Tobacco Use   Smoking status: Former    Types: Cigarettes   Smokeless tobacco: Never  Vaping Use   Vaping status: Never Used  Substance and Sexual Activity   Drug use: Never   Social Determinants of Health   Financial Resource Strain: Low Risk  (12/30/2021)   Received from [facility]   Overall Financial Resource Strain (CARDIA)    Difficulty of Paying Living Expenses: Not hard at all  Food Insecurity: No Food Insecurity (12/30/2021)   Received from [facility]   Hunger Vital Sign    Worried About Running Out of Food in the Last Year:  Never true    Ran Out of Food in the Last Year: Never true  Transportation Needs: No Transportation Needs (12/30/2021)   Received from [facility]   PRAPARE - Transportation    Lack of Transportation (Medical): No    Lack of Transportation (Non-Medical): No    Objective:    Vitals:   11/26/22 1008  BP: 120/63  Pulse: 79  Temp: 36.6 C (97.9 F)  SpO2: 99%  Weight: 85.3 kg (188 lb)  Height: 175.3 cm (5' 9")  PainSc: 0-No pain    Body mass index is 27.76 kg/m.  Gen: A&Ox3, no distress  Unlabored respiration Abdomen soft and nontender.  Mildly tender mass in the right groin which protrudes further with Salvo consistent with right inguinal hernia.  No hernia on the left.   Assessment and Plan:  Diagnoses and all orders for this visit:  Non-recurrent unilateral inguinal hernia without obstruction or gangrene    We discussed the relevant anatomy and we discussed options for repair.  I recommend an open approach and went over the technique of the procedure.  Discussed risks of bleeding, infection, pain, scarring, injury to structures in the area including nerves, blood vessels, bowel, bladder, risk of chronic pain, hernia recurrence, risk of seroma or hematoma, urinary retention, and risks of general anesthesia including cardiovascular, pulmonary, and thromboembolic complications.  We discussed typical postop recovery, timeline, and activity limitations.  We also discussed the option of ongoing observation, with high rate of ultimately returning for surgery and risk of increasing size/symptoms from the hernia as well as incarceration/strangulation and went over symptoms that should prompt him to seek emergency treatment.  Questions were come and answered to the patient's satisfaction. Patient wishes to proceed with scheduling.   Graelyn Bihl AMANDA Masayuki Sakai, MD      

## 2022-11-26 NOTE — H&P (Signed)
Lucas Shepherd [id number]   Referring Provider:  Blenda Bridegroom, MD   Subjective   Chief Complaint: New Consultation (RT INGUINAL HERNIA)     History of Present Illness:    89 year old man with history of anemia, arthritis, BPH, hemorrhoids, GERD, hiatal hernia, kidney stones, osteoporosis who presents for evaluation of a right inguinal hernia.  This has been causing on and off burning pain as well as a noticeable protrusion for the last 2 weeks or so.  No associated GI or urinary symptoms.  Previous abdominal surgery includes a tummy tuck in 1985, Nissen in 2010, hiatal hernia repair in 2013.  He just had a left total knee arthroplasty by Dr. Magnus Ivan a month ago and has been recovering fairly well from this.  He had a CT scan in 2015; this notes a left hydrocele as well as small bilateral inguinoscrotal hernias containing fat.  I have reviewed the images and I do appreciate a very small right inguinal hernia containing fat but the left is not impressive that time.  Review of Systems: A complete review of systems was obtained from the patient.  I have reviewed this information and discussed as appropriate with the patient.  See HPI as well for other ROS.   Medical History: Past Medical History:  Diagnosis Date   Anemia    Arthritis    GERD (gastroesophageal reflux disease)     There is no problem list on file for this patient.   Past Surgical History:  Procedure Laterality Date   Shoulder arthroscopy (Right)  1954   ABDOMINOPLASTY  1985   Nissen fundoplication  2010   Shoulder arthroscopy (Left)  05/28/2011   Orif humerus fracture  12/24/2011   Knee arthroscopy (Left)  01/23/2016    Total shoulder arthroplasty (Right)   2023    Total knee arthroplasty (Left)   10/27/2022   CATARACT EXTRACTION     HERNIA REPAIR     spine fussion       Allergies  Allergen Reactions   Asprin Ec Low Dose [Aspirin] Other (See Comments)    Previous Bleeding Ulcer   Nsaids  (Non-Steroidal Anti-Inflammatory Drug) Other (See Comments)    Previous Bleeding Ulcer    Current Outpatient Medications on File Prior to Visit  Medication Sig Dispense Refill   collagen, bovine, 100 % Powd Take by mouth     cyanocobalamin (VITAMIN B12) 1000 MCG tablet Take by mouth     denosumab (PROLIA) 60 mg/mL inj syringe      famotidine, bulk, 100 % Powd      finasteride (PROSCAR) 5 mg tablet Take 1 tablet by mouth once daily     gabapentin (NEURONTIN) 100 MG capsule      magnesium oxide 500 mg magnesium Tab 1 tablet after breakfast Orally Once a day     methylcellulose (CITRUCEL) oral powder Take by mouth     multivitamin with minerals (MULTIPLE VITAMIN-MINERALS ORAL) Take by mouth as directed     oxyCODONE (ROXICODONE) 5 MG immediate release tablet Take by mouth     tamsulosin (FLOMAX) 0.4 mg capsule Take 0.4 mg by mouth once daily     turmeric/turmeric ext/pepr ext (TURMERIC-TURMERIC EXT-PEPPER) 500-3 mg Cap 1 capsule after breakfast Orally once a day     No current facility-administered medications on file prior to visit.    Family History  Problem Relation Age of Onset   Nephrolithiasis Mother    Heart defect Father    Lung cancer Brother  Social History   Tobacco Use  Smoking Status Former   Types: Cigarettes  Smokeless Tobacco Never     Social History   Socioeconomic History   Marital status: Married  Tobacco Use   Smoking status: Former    Types: Cigarettes   Smokeless tobacco: Never  Vaping Use   Vaping status: Never Used  Substance and Sexual Activity   Drug use: Never   Social Determinants of Health   Financial Resource Strain: Low Risk  (12/30/2021)   Received from [facility]   Overall Financial Resource Strain (CARDIA)    Difficulty of Paying Living Expenses: Not hard at all  Food Insecurity: No Food Insecurity (12/30/2021)   Received from [facility]   Hunger Vital Sign    Worried About Running Out of Food in the Last Year:  Never true    Ran Out of Food in the Last Year: Never true  Transportation Needs: No Transportation Needs (12/30/2021)   Received from [facility]   PRAPARE - Transportation    Lack of Transportation (Medical): No    Lack of Transportation (Non-Medical): No    Objective:    Vitals:   11/26/22 1008  BP: 120/63  Pulse: 79  Temp: 36.6 C (97.9 F)  SpO2: 99%  Weight: 85.3 kg (188 lb)  Height: 175.3 cm (5\' 9" )  PainSc: 0-No pain    Body mass index is 27.76 kg/m.  Gen: A&Ox3, no distress  Unlabored respiration Abdomen soft and nontender.  Mildly tender mass in the right groin which protrudes further with Salvo consistent with right inguinal hernia.  No hernia on the left.   Assessment and Plan:  Diagnoses and all orders for this visit:  Non-recurrent unilateral inguinal hernia without obstruction or gangrene    We discussed the relevant anatomy and we discussed options for repair.  I recommend an open approach and went over the technique of the procedure.  Discussed risks of bleeding, infection, pain, scarring, injury to structures in the area including nerves, blood vessels, bowel, bladder, risk of chronic pain, hernia recurrence, risk of seroma or hematoma, urinary retention, and risks of general anesthesia including cardiovascular, pulmonary, and thromboembolic complications.  We discussed typical postop recovery, timeline, and activity limitations.  We also discussed the option of ongoing observation, with high rate of ultimately returning for surgery and risk of increasing size/symptoms from the hernia as well as incarceration/strangulation and went over symptoms that should prompt him to seek emergency treatment.  Questions were come and answered to the patient's satisfaction. Patient wishes to proceed with scheduling.   [name], MD

## 2022-11-26 NOTE — Therapy (Signed)
OUTPATIENT PHYSICAL THERAPY LOWER EXTREMITY TREATMENT   Patient Name: Lucas Shepherd MRN: [id number] DOB:August 16, 1928, 89 y.o., male Today's Date: 11/26/2022  END OF SESSION:  PT End of Session - 11/26/22 1513     Visit Number 5    Number of Visits 22    Date for PT Re-Evaluation 01/22/23    Authorization Type [employer]    Authorization Time Period $20 COPAY Approved  Authorization #[id number]  Tracking #[id number]  12 PT VISITS 5/6-7/12/24    Authorization - Number of Visits 12    Progress Note Due on Visit 10    PT Start Time 1515    PT Stop Time 1606    PT Time Calculation (min) 51 min    Activity Tolerance Patient tolerated treatment well;No increased pain;Patient limited by pain    Behavior During Therapy [facility] for tasks assessed/performed             Past Medical History:  Diagnosis Date   Anemia    related to past gastric ulcers   Arthritis    Blood transfusion    BPH (benign prostatic hyperplasia)    External hemorrhoids    GERD (gastroesophageal reflux disease)    nisseum fundoplication 2010   GI bleed 06/2011   got 3 units blood   Hiatal hernia    History of kidney stones    History of TMJ syndrome    mild   History of urinary frequency    Osteoporosis    Urinary urgency    Past Surgical History:  Procedure Laterality Date   ABDOMINAL SURGERY  1985   tummy tuc   CATARACT EXTRACTION Bilateral    COLONOSCOPY     ESOPHAGOGASTRODUODENOSCOPY  06/12/2011   Procedure: ESOPHAGOGASTRODUODENOSCOPY (EGD);  Surgeon: Yancey Flemings, MD;  Location: [facility];  Service: Endoscopy;  Laterality: N/A;   HARDWARE REMOVAL  02/23/2012   Procedure: HARDWARE REMOVAL;  Surgeon: Wyn Forster., MD;  Location: [facility];  Service: Orthopedics;  Laterality: Left;  biceps tenolysis and screw removal   HERNIA REPAIR  2013   Hiatal Hernia Repair   KNEE ARTHROSCOPY Left 7/13 2017   NISSEN FUNDOPLICATION  2010   ORIF HUMERUS FRACTURE  12/24/2011    Procedure: OPEN REDUCTION INTERNAL FIXATION (ORIF) PROXIMAL HUMERUS FRACTURE;  Surgeon: Wyn Forster., MD;  Location: [facility];  Service: Orthopedics;  Laterality: Left;  ORIF left humerus   SHOULDER ARTHROSCOPY Right 1954   SHOULDER ARTHROSCOPY Left 05/28/2011   x 2   spine fussion     x2   TOTAL KNEE ARTHROPLASTY Left 10/27/2022   Procedure: LEFT TOTAL KNEE ARTHROPLASTY;  Surgeon: Kathryne Hitch, MD;  Location: [facility];  Service: Orthopedics;  Laterality: Left;   TOTAL SHOULDER ARTHROPLASTY Right 2023   x2   Patient Active Problem List   Diagnosis Date Noted   Status post total left knee replacement 10/27/2022   Unilateral primary osteoarthritis, left knee 03/10/2022   Pain in right shoulder 08/21/2021   Pain in right hip 07/03/2020   Low back pain 07/03/2020   Status post laparoscopic fundoplication 07/24/2011   History of peptic ulcer 07/24/2011   Esophageal stricture 06/26/2011   H/O: upper GI bleed 06/26/2011   Edema extremities 06/26/2011   Chest pain 06/26/2011   Anemia due to blood loss, acute 06/26/2011   Syncope and collapse 06/11/2011   Melena 06/11/2011   Anemia 06/11/2011   Azotemia 06/11/2011   Volume depletion 06/11/2011   Leukocytosis  06/11/2011   DYSPHAGIA 08/02/2009   HIATAL HERNIA WITH REFLUX 01/30/2009   VITAMIN B12 DEFICIENCY 01/01/2009   BENIGN PROSTATIC HYPERTROPHY, WITH OBSTRUCTION 12/28/2008   FECAL INCONTINENCE 12/28/2008   GERD 12/27/2008   DIVERTICULOSIS, COLON 12/27/2008   RENAL CALCULUS 12/27/2008    PCP: Farris Has, MD  REFERRING PROVIDER: Doneen Poisson, MD  REFERRING DIAG: [phone] (ICD-10-CM) - Status post total left knee replacement   THERAPY DIAG:  Stiffness of left knee, not elsewhere classified  Muscle weakness (generalized)  Acute pain of left knee  Other abnormalities of gait and mobility  Unsteadiness on feet  Rationale for Evaluation and Treatment: Rehabilitation  ONSET DATE:  10/27/2022 Left TKA sg  SUBJECTIVE:   SUBJECTIVE STATEMENT:  He relays he took pain med before coming so his pain is not too bad at the moment PERTINENT HISTORY: OA, osteoporosis,  LBP, spine fusion, peptic ulcer, benign prostate hypertrophy with obstruction, diverticulosis, total shoulder arthroplasty X 2 on BUEs, left ORIF humeral fracture,   PAIN:  NPRS scale:  3/10 Pain location: left knee in joint, proximally where tourniquet was located medially, anteriorly, and laterally.  Pain description: ache Aggravating factors: not taking Oxy,  Relieving factors: meds, elevation, ice  PRECAUTIONS: Fall  WEIGHT BEARING RESTRICTIONS: Yes LLE WBAT  FALLS:  Has patient fallen in last 6 months? No  LIVING ENVIRONMENT: Lives with: lives with their spouse Lives in: House Stairs: single step with 2 grab bars to enter.  Home is 2 story but able to live on first floor.  Has following equipment at home: Single point cane, Walker - 2 wheeled, Grab bars, and built-in shower seat  OCCUPATION: retired.  PLOF: Independent  PATIENT GOALS:  golfing, being active in community.  Next MD visit: 12/09/2022  OBJECTIVE:  DIAGNOSTIC FINDINGS: 10/27/22 post surgery X-ray Postsurgical changes of left total knee arthroplasty. No evidence of immediate hardware complication.  PATIENT SURVEYS:  Eval / 11/16/2022: FOTO intake:  21%  predicted:  44%  COGNITION: Overall cognitive status: WFL    SENSATION: WFL  EDEMA:  Eval / 11/16/2022: LLE: above knee 51.5 cm,  around knee 52.7 cm, below knee 42.3 cm RLE: above knee 44.5 cm,  around knee 44 cm, below knee 37.8 cm  POSTURE: Eval / 11/16/2022: flexed trunk  and weight shift right  PALPATION: Eval / 11/16/2022:  Tenderness left knee around joint line, quadriceps muscle belly, ITB  LOWER EXTREMITY ROM:   ROM Left Eval 11/16/22 Left 11/19/2022  Hip flexion    Hip extension    Hip abduction    Hip adduction    Hip internal rotation    Hip external rotation     Knee flexion Seated A: 81* P: 84* Active assisted (with belt) 97  Knee extension Seated  LAQ A: -24* Supine Quad set -11* P: -10* Active -7  Ankle dorsiflexion    Ankle plantarflexion    Ankle inversion    Ankle eversion     (Blank rows = not tested)  LOWER EXTREMITY MMT:  MMT Left Eval 11/16/22  Hip flexion   Hip extension   Hip abduction   Hip adduction   Hip internal rotation   Hip external rotation   Knee flexion 3-/5  Knee extension 3-/5  Ankle dorsiflexion   Ankle plantarflexion   Ankle inversion   Ankle eversion    (Blank rows = not tested)  FUNCTIONAL TESTS:  Eval / 11/16/2022: 18 inch chair transfer: requires use of armrests to arise and RW to stabilize  GAIT: Eval / 11/16/2022: Distance walked: 100'  Assistive device utilized: Environmental consultant - 2 wheeled Level of assistance: SBA Comments: excessive UE weight bearing on RW, left knee flexed in stance with minimal increase for swing. Antalgic with decreased LLE stance duration.    TODAY'S TREATMENT                                                                          DATE: 11/26/2022: Therapeutic Exercise: Nu step L6-5 X 8 min Double Leg Press: 75# 10 reps 2 sets slow stretch into flexion and extension Single Leg Press: 31# 10 reps 2 sets slow stretch into flexion and extension Step ups 6 inch step leading with left leg X 10 with bilat UE support Standing hamstring stretch with left foot on 6 inch step 20 sec X 3 Seated LAQ 3# 10 reps 2 sets. Seated knee flexion AAROM stretch from other leg, 5 sec X 15  Manual Therapy: Knee flexion and extension PROM with overpressure seated, extension mobs grade 3  Gait Training Pt amb 75' X 2 with cane, he prefers to use it in his left hand due to his weakness in Rt hip, he tried it on each side and does appear to have more stability with cane in left hand so I think this is fine for him.  Vaso left knee 10 minutes Medium Pressure 34* (first 3 minutes with heel prop knee  extension stretch)  11/23/2022: Therapeutic Exercise: SciFit bike with LEs only level 3 for 8 min. Double Leg Press: 75# 10 reps 2 sets slow stretch into flexion and extension Single Leg Press: 31# 10 reps 2 sets slow stretch into flexion and extension Standing LLE TKE red theraband 10 reps ea position 1st bilateral stance, 2nd set LLE initial contact position & 3rd set LLE terminal stance position. Seated LAQ 3# & active knee flexion with contralateral LE opposing motion 10 reps 2 sets.  Manual Therapy: PROM knee ext during rests on leg press bw sets Knee flexion PROM with overpressure seated  Gait Training Pt amb 75' X 2 with cane with no balance or knee instability issues. PT recommended initiating cane use in home where flat surfaces & limited distances. Pt verbalized understanding including pain as guide to tolerance for frequency.   Vaso left knee 10 minutes Medium Pressure 34*  11/19/2022: Quadriceps sets 2 sets of 10 with left heel prop and 1 set of 10 in between knee flexion with strap 5 seconds Knee flexion with strap 10X 10 seconds Seated knee flexion with PT overpressure 10X 10 seconds Tailgate knee flexion 1 minute  Functional Activities: Double Leg Press: 62# 15X slow eccentrics stretch into flexion and extension Single Leg Press: 25# 15X slow eccentrics stretch into flexion and extension  Vaso left knee 10 minutes Medium Pressure 34*   11/17/2022: Therapeutic Exercise: Nustep seat 12 level 5 with BLEs / BUEs for 8 min Seated LAQ & active knee flexion with contralateral LE opposing motion 2 min Gastroc stretch step heel depression 30 sec 2 reps Standing heel raises BUE light support RW 15 reps 5 sec hold Hamstring stretch LLE long sit with strap DF 30 sec hold 2 reps Bridge 5 sec hold 10 reps Quad set supine  with PT AA 5 sec hold 15 reps Leg press manual resistance 15 reps 5 sec hold Quad stretch hooklying LLE over edge with strap knee flexion 30 sec hold 2  reps Heel slide with lower leg on ball strap assist 5 sec hold flexion & ext 10 reps  Manual Therapy PROM with overpressure & contract-relax for left knee flexion & ext  Vaso LLE with dual hose ankle & knee for whole leg edema management medium compression 34* 10 min with elevation   HOME EXERCISE PROGRAM: Access Code: [email or web address] URL: [email or web address] Date: 11/16/2022 Prepared by: Vladimir Faster  Exercises - Ankle Alphabet in Elevation  - 2-4 x daily - 7 x weekly - 3 sets - 1 reps - Quad Setting and Stretching  - 2-4 x daily - 7 x weekly - 5-10 sets - 10 reps - 5 hold - Supine Knee Extension Strengthening  - 1 x daily - 5 x weekly - 1 sets - 10 reps - 5 seconds hold - Supine Heel Slide with Strap  - 2-3 x daily - 7 x weekly - 2-3 sets - 10 reps - 5 seconds hold - Supine Straight Leg Raises  - 2-3 x daily - 7 x weekly - 2-3 sets - 10 reps - 5 seconds hold - Seated Knee Flexion Extension AROM   - 2-4 x daily - 7 x weekly - 2-3 sets - 10 reps - 5 seconds hold - Seated straight leg lifts  - 2-3 x daily - 7 x weekly - 2-3 sets - 10 reps - 5 seconds hold - Seated Hamstring Stretch with Strap  - 2-4 x daily - 7 x weekly - 1 sets - 3 reps - 20-30 seconds hold - Seated Long Arc Quad  - 1 x daily - 5 x weekly - 1 sets - 10 reps - 5 seconds hold  ASSESSMENT: CLINICAL IMPRESSION: He had good tolerance to session today and is overall progressing with his knee ROM and strength. He will have upcoming hernia surgery 12/09/22  OBJECTIVE IMPAIRMENTS: Abnormal gait, decreased activity tolerance, decreased balance, decreased endurance, decreased knowledge of condition, decreased knowledge of use of DME, decreased mobility, difficulty walking, decreased ROM, decreased strength, increased edema, and pain.   ACTIVITY LIMITATIONS: carrying, lifting, bending, sitting, standing, squatting, sleeping, stairs, transfers, and locomotion level  PARTICIPATION LIMITATIONS: meal prep, cleaning,  laundry, driving, and community activity  PERSONAL FACTORS: Age, Fitness, Time since onset of injury/illness/exacerbation, and 1-2 comorbidities: see PMH  are also affecting patient's functional outcome.   REHAB POTENTIAL: Good  CLINICAL DECISION MAKING: Stable/uncomplicated  EVALUATION COMPLEXITY: Low   GOALS: Goals reviewed with patient? Yes  SHORT TERM GOALS: (target date for Short term goals 12/17/2022)   1.  Patient will demonstrate independent use of home exercise program to maintain progress from in clinic treatments.  Goal status: ongoing 11/19/2022  2. PROM left knee ext -2* and flexion 100*  Goal status: ongoing 11/19/2022  LONG TERM GOALS: (target dates for all long term goals  01/22/2023 )   1. Patient will demonstrate/report pain at worst less than or equal to 2/10 to facilitate minimal limitation in daily activity secondary to pain symptoms.  Goal status: ongoing 11/19/2022   2. Patient will demonstrate independent use of home exercise program to facilitate ability to maintain/progress functional gains from skilled physical therapy services.  Goal status: ongoing 11/19/2022   3. Patient will demonstrate FOTO outcome > or = 44 % to indicate reduced disability due to condition.  Goal status:  ongoing 11/17/2022   4.  Patient will demonstrate left LE MMT 5/5 throughout to faciltiate usual transfers, stairs, squatting at [facility] for daily life.   Goal status:  ongoing 11/17/2022   5.  Patient ambulates with cane >300' and negotiates ramps / curbs independently.  Goal status: ongoing 11/17/2022   6.  left Knee PROM 0* to 105* Goal status: ongoing 11/19/2022   7.  left knee AROM ext 0* and flexion 100* Goal Status: ongoing 11/19/2022  PLAN:  PT FREQUENCY:  2-3x/week  PT DURATION: 10 weeks  PLANNED INTERVENTIONS: Therapeutic exercises, Therapeutic activity, Neuro Muscular re-education, Balance training, Gait training, Patient/Family education, Joint mobilization, Stair training,  DME instructions, Dry Needling, Electrical stimulation, Traction, Cryotherapy, vasopneumatic deviceMoist heat, Taping, Ultrasound, Ionotophoresis 4mg /ml Dexamethasone, and aquatic therapy, Manual therapy.  All included unless contraindicated  PLAN FOR NEXT SESSION: Check range of motion.  Continue to progress functional exercises & manual therapy for range /strength. Be aware of right inguinal hernia with exercises and he is having surgery 12/09/22.  Vaso for edema to end session.     April Manson, PT, DPT 11/26/2022, 3:58 PM

## 2022-11-30 ENCOUNTER — Encounter: Payer: Self-pay | Admitting: Physical Therapy

## 2022-11-30 ENCOUNTER — Ambulatory Visit: Payer: Medicare PPO | Admitting: Physical Therapy

## 2022-11-30 DIAGNOSIS — R2689 Other abnormalities of gait and mobility: Secondary | ICD-10-CM | POA: Diagnosis not present

## 2022-11-30 DIAGNOSIS — M25562 Pain in left knee: Secondary | ICD-10-CM

## 2022-11-30 DIAGNOSIS — R600 Localized edema: Secondary | ICD-10-CM

## 2022-11-30 DIAGNOSIS — M6281 Muscle weakness (generalized): Secondary | ICD-10-CM

## 2022-11-30 DIAGNOSIS — R2681 Unsteadiness on feet: Secondary | ICD-10-CM | POA: Diagnosis not present

## 2022-11-30 DIAGNOSIS — M25662 Stiffness of left knee, not elsewhere classified: Secondary | ICD-10-CM | POA: Diagnosis not present

## 2022-11-30 NOTE — Therapy (Signed)
OUTPATIENT PHYSICAL THERAPY LOWER EXTREMITY TREATMENT   Patient Name: Lucas Shepherd MRN: [id number] DOB:02-04-1929, 89 y.o., male Today's Date: 11/30/2022  END OF SESSION:  PT End of Session - 11/30/22 1430     Visit Number 6    Number of Visits 22    Date for PT Re-Evaluation 01/22/23    Authorization Type [employer]    Authorization Time Period $20 COPAY Approved  Authorization #[id number]  Tracking #[id number]  12 PT VISITS 5/6-7/12/24    Authorization - Visit Number 6    Authorization - Number of Visits 12    Progress Note Due on Visit 10    PT Start Time 1430    PT Stop Time 1525    PT Time Calculation (min) 55 min    Activity Tolerance Patient tolerated treatment well;No increased pain    Behavior During Therapy WFL for tasks assessed/performed             Past Medical History:  Diagnosis Date   Anemia    related to past gastric ulcers   Arthritis    Blood transfusion    BPH (benign prostatic hyperplasia)    External hemorrhoids    GERD (gastroesophageal reflux disease)    nisseum fundoplication 2010   GI bleed 06/2011   got 3 units blood   Hiatal hernia    History of kidney stones    History of TMJ syndrome    mild   History of urinary frequency    Osteoporosis    Urinary urgency    Past Surgical History:  Procedure Laterality Date   ABDOMINAL SURGERY  1985   tummy tuc   CATARACT EXTRACTION Bilateral    COLONOSCOPY     ESOPHAGOGASTRODUODENOSCOPY  06/12/2011   Procedure: ESOPHAGOGASTRODUODENOSCOPY (EGD);  Surgeon: Yancey Flemings, MD;  Location: [facility];  Service: Endoscopy;  Laterality: N/A;   HARDWARE REMOVAL  02/23/2012   Procedure: HARDWARE REMOVAL;  Surgeon: Wyn Forster., MD;  Location: [facility];  Service: Orthopedics;  Laterality: Left;  biceps tenolysis and screw removal   HERNIA REPAIR  2013   Hiatal Hernia Repair   KNEE ARTHROSCOPY Left 7/13 2017   NISSEN FUNDOPLICATION  2010   ORIF HUMERUS FRACTURE   12/24/2011   Procedure: OPEN REDUCTION INTERNAL FIXATION (ORIF) PROXIMAL HUMERUS FRACTURE;  Surgeon: Wyn Forster., MD;  Location: [facility];  Service: Orthopedics;  Laterality: Left;  ORIF left humerus   SHOULDER ARTHROSCOPY Right 1954   SHOULDER ARTHROSCOPY Left 05/28/2011   x 2   spine fussion     x2   TOTAL KNEE ARTHROPLASTY Left 10/27/2022   Procedure: LEFT TOTAL KNEE ARTHROPLASTY;  Surgeon: Kathryne Hitch, MD;  Location: [facility];  Service: Orthopedics;  Laterality: Left;   TOTAL SHOULDER ARTHROPLASTY Right 2023   x2   Patient Active Problem List   Diagnosis Date Noted   Status post total left knee replacement 10/27/2022   Unilateral primary osteoarthritis, left knee 03/10/2022   Pain in right shoulder 08/21/2021   Pain in right hip 07/03/2020   Low back pain 07/03/2020   Status post laparoscopic fundoplication 07/24/2011   History of peptic ulcer 07/24/2011   Esophageal stricture 06/26/2011   H/O: upper GI bleed 06/26/2011   Edema extremities 06/26/2011   Chest pain 06/26/2011   Anemia due to blood loss, acute 06/26/2011   Syncope and collapse 06/11/2011   Melena 06/11/2011   Anemia 06/11/2011   Azotemia 06/11/2011   Volume  depletion 06/11/2011   Leukocytosis 06/11/2011   DYSPHAGIA 08/02/2009   HIATAL HERNIA WITH REFLUX 01/30/2009   VITAMIN B12 DEFICIENCY 01/01/2009   BENIGN PROSTATIC HYPERTROPHY, WITH OBSTRUCTION 12/28/2008   FECAL INCONTINENCE 12/28/2008   GERD 12/27/2008   DIVERTICULOSIS, COLON 12/27/2008   RENAL CALCULUS 12/27/2008    PCP: Farris Has, MD  REFERRING PROVIDER: Doneen Poisson, MD  REFERRING DIAG: [phone] (ICD-10-CM) - Status post total left knee replacement   THERAPY DIAG:  Stiffness of left knee, not elsewhere classified  Muscle weakness (generalized)  Acute pain of left knee  Other abnormalities of gait and mobility  Unsteadiness on feet  Localized edema  Rationale for Evaluation and Treatment:  Rehabilitation  ONSET DATE: 10/27/2022 Left TKA sg  SUBJECTIVE:   SUBJECTIVE STATEMENT:  He is having hernia surgery on 12/09/2022 and pre-op tomorrow.  His neuropathy pain limited his mobility this weekend. He propped his leg up with weight on knee.   PERTINENT HISTORY: OA, osteoporosis,  LBP, spine fusion, peptic ulcer, benign prostate hypertrophy with obstruction, diverticulosis, total shoulder arthroplasty X 2 on BUEs, left ORIF humeral fracture,   PAIN:  NPRS scale: today  5/10 and since last PT lowest 3/10 highest 8/10 Pain location: left knee in joint, proximally where tourniquet was located medially, anteriorly, and laterally.  Pain description: ache Aggravating factors: not taking Oxy,  Relieving factors: meds, elevation, ice  PRECAUTIONS: Fall  WEIGHT BEARING RESTRICTIONS: Yes LLE WBAT  FALLS:  Has patient fallen in last 6 months? No  LIVING ENVIRONMENT: Lives with: lives with their spouse Lives in: House Stairs: single step with 2 grab bars to enter.  Home is 2 story but able to live on first floor.  Has following equipment at home: Single point cane, Walker - 2 wheeled, Grab bars, and built-in shower seat  OCCUPATION: retired.  PLOF: Independent  PATIENT GOALS:  golfing, being active in community.  Next MD visit: 12/09/2022  OBJECTIVE:  DIAGNOSTIC FINDINGS: 10/27/22 post surgery X-ray Postsurgical changes of left total knee arthroplasty. No evidence of immediate hardware complication.  PATIENT SURVEYS:  Eval / 11/16/2022: FOTO intake:  21%  predicted:  44%  COGNITION: Overall cognitive status: WFL    SENSATION: WFL  EDEMA:  Eval / 11/16/2022: LLE: above knee 51.5 cm,  around knee 52.7 cm, below knee 42.3 cm RLE: above knee 44.5 cm,  around knee 44 cm, below knee 37.8 cm  POSTURE: Eval / 11/16/2022: flexed trunk  and weight shift right  PALPATION: Eval / 11/16/2022:  Tenderness left knee around joint line, quadriceps muscle belly, ITB  LOWER EXTREMITY  ROM:   ROM Left Eval 11/16/22 Left 11/19/2022 Left 11/30/22  Hip flexion     Hip extension     Hip abduction     Hip adduction     Hip internal rotation     Hip external rotation     Knee flexion Seated A: 81* P: 84* Active assisted (with belt) 97 Supine P: 93* A: 88*  Knee extension Seated  LAQ A: -24* Supine Quad set -11* P: -10* Active -7 Supine P: -9* Seated  LAQ -11*  Ankle dorsiflexion     Ankle plantarflexion     Ankle inversion     Ankle eversion      (Blank rows = not tested)  LOWER EXTREMITY MMT:  MMT Left Eval 11/16/22  Hip flexion   Hip extension   Hip abduction   Hip adduction   Hip internal rotation   Hip external rotation  Knee flexion 3-/5  Knee extension 3-/5  Ankle dorsiflexion   Ankle plantarflexion   Ankle inversion   Ankle eversion    (Blank rows = not tested)  FUNCTIONAL TESTS:  Eval / 11/16/2022: 18 inch chair transfer: requires use of armrests to arise and RW to stabilize  GAIT: Eval / 11/16/2022: Distance walked: 100'  Assistive device utilized: Environmental consultant - 2 wheeled Level of assistance: SBA Comments: excessive UE weight bearing on RW, left knee flexed in stance with minimal increase for swing. Antalgic with decreased LLE stance duration.    TODAY'S TREATMENT                                                                          DATE: 11/30/2022: Therapeutic Exercise: SciFit bike seat 14 level 3 LEs only 8 min Chair prop stretch with LLE ext in 2nd chair 5# wt hang with intermittent leg press for 2 min, knee flexed with foot at edge of 2nd chair 2 min, then 2nd set LLE ext in 2nd chair 5# wt hang with intermittent leg press for 2 min Gastroc stretch step heel depression 30 sec hold 2 reps Heel raises BLEs 15 reps Tandem stance on floor with LLE in front & in back with intermittent touch 30 sec ea Double Leg Press: 75# 15 reps 1 sets slow stretch into flexion and extension Single Leg Press: 37# 15 reps 1 sets slow stretch into flexion  and extension Step ups 6 inch step AROM Left knee flexion, leading with left leg up and RLE down controlled eccentric with bilat UE support 10 reps Standing hamstring stretch with left foot on 6 inch step 20 sec X 3  Manual Therapy: Knee flexion and extension PROM with overpressure flexion seated, extension supine mobs grade 3  Gait Training Pt amb 75' X 2 with cane, he prefers to use it in his left hand due to his weakness in Rt hip, he tried it on each side and does appear to have more stability with cane in left hand so I think this is fine for him. PT recommended purchase new tip for cane.   Vaso left knee 10 minutes Medium Pressure 34* with elevation    11/26/2022: Therapeutic Exercise: Nu step L6-5 X 8 min Double Leg Press: 75# 10 reps 2 sets slow stretch into flexion and extension Single Leg Press: 31# 10 reps 2 sets slow stretch into flexion and extension Step ups 6 inch step leading with left leg X 10 with bilat UE support Standing hamstring stretch with left foot on 6 inch step 20 sec X 3 Seated LAQ 3# 10 reps 2 sets. Seated knee flexion AAROM stretch from other leg, 5 sec X 15  Manual Therapy: Knee flexion and extension PROM with overpressure seated, extension mobs grade 3  Gait Training Pt amb 75' X 2 with cane, he prefers to use it in his left hand due to his weakness in Rt hip, he tried it on each side and does appear to have more stability with cane in left hand so I think this is fine for him.  Vaso left knee 10 minutes Medium Pressure 34* (first 3 minutes with heel prop knee extension stretch)  11/23/2022: Therapeutic Exercise: SciFit  bike with LEs only level 3 for 8 min. Double Leg Press: 75# 10 reps 2 sets slow stretch into flexion and extension Single Leg Press: 31# 10 reps 2 sets slow stretch into flexion and extension Standing LLE TKE red theraband 10 reps ea position 1st bilateral stance, 2nd set LLE initial contact position & 3rd set LLE terminal stance  position. Seated LAQ 3# & active knee flexion with contralateral LE opposing motion 10 reps 2 sets.  Manual Therapy: PROM knee ext during rests on leg press bw sets Knee flexion PROM with overpressure seated  Gait Training Pt amb 75' X 2 with cane with no balance or knee instability issues. PT recommended initiating cane use in home where flat surfaces & limited distances. Pt verbalized understanding including pain as guide to tolerance for frequency.   Vaso left knee 10 minutes Medium Pressure 34*    HOME EXERCISE PROGRAM: Access Code: [email or web address] URL: [email or web address] Date: 11/16/2022 Prepared by: Vladimir Faster  Exercises - Ankle Alphabet in Elevation  - 2-4 x daily - 7 x weekly - 3 sets - 1 reps - Quad Setting and Stretching  - 2-4 x daily - 7 x weekly - 5-10 sets - 10 reps - 5 hold - Supine Knee Extension Strengthening  - 1 x daily - 5 x weekly - 1 sets - 10 reps - 5 seconds hold - Supine Heel Slide with Strap  - 2-3 x daily - 7 x weekly - 2-3 sets - 10 reps - 5 seconds hold - Supine Straight Leg Raises  - 2-3 x daily - 7 x weekly - 2-3 sets - 10 reps - 5 seconds hold - Seated Knee Flexion Extension AROM   - 2-4 x daily - 7 x weekly - 2-3 sets - 10 reps - 5 seconds hold - Seated straight leg lifts  - 2-3 x daily - 7 x weekly - 2-3 sets - 10 reps - 5 seconds hold - Seated Hamstring Stretch with Strap  - 2-4 x daily - 7 x weekly - 1 sets - 3 reps - 20-30 seconds hold - Seated Long Arc Quad  - 1 x daily - 5 x weekly - 1 sets - 10 reps - 5 seconds hold  ASSESSMENT: CLINICAL IMPRESSION: Pt continues to need PT instruction for progressing left knee exercises.  He has limitations with knee extension both passively & actively which impacts functional activities. Pt continues to benefit from skilled PT   OBJECTIVE IMPAIRMENTS: Abnormal gait, decreased activity tolerance, decreased balance, decreased endurance, decreased knowledge of condition, decreased knowledge of use  of DME, decreased mobility, difficulty walking, decreased ROM, decreased strength, increased edema, and pain.   ACTIVITY LIMITATIONS: carrying, lifting, bending, sitting, standing, squatting, sleeping, stairs, transfers, and locomotion level  PARTICIPATION LIMITATIONS: meal prep, cleaning, laundry, driving, and community activity  PERSONAL FACTORS: Age, Fitness, Time since onset of injury/illness/exacerbation, and 1-2 comorbidities: see PMH  are also affecting patient's functional outcome.   REHAB POTENTIAL: Good  CLINICAL DECISION MAKING: Stable/uncomplicated  EVALUATION COMPLEXITY: Low   GOALS: Goals reviewed with patient? Yes  SHORT TERM GOALS: (target date for Short term goals 12/17/2022)   1.  Patient will demonstrate independent use of home exercise program to maintain progress from in clinic treatments.  Goal status: ongoing 11/19/2022  2. PROM left knee ext -2* and flexion 100*  Goal status: ongoing 11/19/2022  LONG TERM GOALS: (target dates for all long term goals  01/22/2023 )   1. Patient will demonstrate/report pain at  worst less than or equal to 2/10 to facilitate minimal limitation in daily activity secondary to pain symptoms.  Goal status: ongoing 11/19/2022   2. Patient will demonstrate independent use of home exercise program to facilitate ability to maintain/progress functional gains from skilled physical therapy services.  Goal status: ongoing 11/19/2022   3. Patient will demonstrate FOTO outcome > or = 44 % to indicate reduced disability due to condition.  Goal status: ongoing 11/17/2022   4.  Patient will demonstrate left LE MMT 5/5 throughout to faciltiate usual transfers, stairs, squatting at [facility] for daily life.   Goal status:  ongoing 11/17/2022   5.  Patient ambulates with cane >300' and negotiates ramps / curbs independently.  Goal status: ongoing 11/17/2022   6.  left Knee PROM 0* to 105* Goal status: ongoing 11/19/2022   7.  left knee AROM ext 0* and  flexion 100* Goal Status: ongoing 11/19/2022  PLAN:  PT FREQUENCY:  2-3x/week  PT DURATION: 10 weeks  PLANNED INTERVENTIONS: Therapeutic exercises, Therapeutic activity, Neuro Muscular re-education, Balance training, Gait training, Patient/Family education, Joint mobilization, Stair training, DME instructions, Dry Needling, Electrical stimulation, Traction, Cryotherapy, vasopneumatic deviceMoist heat, Taping, Ultrasound, Ionotophoresis 4mg /ml Dexamethasone, and aquatic therapy, Manual therapy.  All included unless contraindicated  PLAN FOR NEXT SESSION: check if surgeon for hernia has limitations/ precautions. Continue to progress functional exercises & manual therapy for range /strength. Be aware of right inguinal hernia with exercises and he is having surgery 12/09/22.  Vaso for edema to end session.     Vladimir Faster, PT, DPT 11/30/2022, 3:26 PM

## 2022-12-01 ENCOUNTER — Encounter (HOSPITAL_COMMUNITY)
Admission: RE | Admit: 2022-12-01 | Discharge: 2022-12-01 | Disposition: A | Discharge status: Home / Self Care | Payer: Medicare PPO | Source: Ambulatory Visit | Attending: Surgery | Admitting: Surgery

## 2022-12-01 ENCOUNTER — Encounter (HOSPITAL_COMMUNITY): Payer: Self-pay

## 2022-12-01 ENCOUNTER — Other Ambulatory Visit: Payer: Self-pay

## 2022-12-01 ENCOUNTER — Encounter: Payer: Self-pay | Admitting: Physical Therapy

## 2022-12-01 ENCOUNTER — Ambulatory Visit: Payer: Medicare PPO | Admitting: Physical Therapy

## 2022-12-01 VITALS — BP 125/55 | HR 62 | Temp 98.0°F | Resp 18 | Ht 69.0 in | Wt 185.5 lb

## 2022-12-01 DIAGNOSIS — R2681 Unsteadiness on feet: Secondary | ICD-10-CM | POA: Diagnosis not present

## 2022-12-01 DIAGNOSIS — R600 Localized edema: Secondary | ICD-10-CM

## 2022-12-01 DIAGNOSIS — R2689 Other abnormalities of gait and mobility: Secondary | ICD-10-CM | POA: Diagnosis not present

## 2022-12-01 DIAGNOSIS — Z01810 Encounter for preprocedural cardiovascular examination: Secondary | ICD-10-CM | POA: Insufficient documentation

## 2022-12-01 DIAGNOSIS — Z01818 Encounter for other preprocedural examination: Secondary | ICD-10-CM

## 2022-12-01 DIAGNOSIS — M25662 Stiffness of left knee, not elsewhere classified: Secondary | ICD-10-CM | POA: Diagnosis not present

## 2022-12-01 DIAGNOSIS — M25562 Pain in left knee: Secondary | ICD-10-CM

## 2022-12-01 DIAGNOSIS — M6281 Muscle weakness (generalized): Secondary | ICD-10-CM | POA: Diagnosis not present

## 2022-12-01 LAB — CBC
HCT: 34.9 % — ABNORMAL LOW (ref 39.0–52.0)
Hemoglobin: 11.1 g/dL — ABNORMAL LOW (ref 13.0–17.0)
MCH: 30.7 pg (ref 26.0–34.0)
MCHC: 31.8 g/dL (ref 30.0–36.0)
MCV: 96.7 fL (ref 80.0–100.0)
Platelets: 263 10*3/uL (ref 150–400)
RBC: 3.61 MIL/uL — ABNORMAL LOW (ref 4.22–5.81)
RDW: 13.9 % (ref 11.5–15.5)
WBC: 7.7 10*3/uL (ref 4.0–10.5)
nRBC: 0 % (ref 0.0–0.2)

## 2022-12-01 NOTE — Therapy (Signed)
OUTPATIENT PHYSICAL THERAPY LOWER EXTREMITY TREATMENT   Patient Name: Lucas Shepherd MRN: [id number] DOB:10-17-1928, 89 y.o., male Today's Date: 12/01/2022  END OF SESSION:  PT End of Session - 12/01/22 1524     Visit Number 7    Number of Visits 22    Date for PT Re-Evaluation 01/22/23    Authorization Type [employer]    Authorization Time Period $20 COPAY Approved  Authorization #[id number]  Tracking #[id number]  12 PT VISITS 5/6-7/12/24    Authorization - Visit Number 7    Authorization - Number of Visits 12    Progress Note Due on Visit 10    PT Start Time 1515    PT Stop Time 1605    PT Time Calculation (min) 50 min    Activity Tolerance Patient tolerated treatment well;No increased pain    Behavior During Therapy WFL for tasks assessed/performed              Past Medical History:  Diagnosis Date   Anemia    related to past gastric ulcers   Arthritis    Blood transfusion    BPH (benign prostatic hyperplasia)    External hemorrhoids    GERD (gastroesophageal reflux disease)    nisseum fundoplication 2010   GI bleed 06/2011   got 3 units blood   Hiatal hernia    History of kidney stones    History of TMJ syndrome    mild   History of urinary frequency    Osteoporosis    Urinary urgency    Past Surgical History:  Procedure Laterality Date   ABDOMINAL SURGERY  1985   tummy tuc   CATARACT EXTRACTION Bilateral    COLONOSCOPY     ESOPHAGOGASTRODUODENOSCOPY  06/12/2011   Procedure: ESOPHAGOGASTRODUODENOSCOPY (EGD);  Surgeon: Yancey Flemings, MD;  Location: [facility];  Service: Endoscopy;  Laterality: N/A;   HARDWARE REMOVAL  02/23/2012   Procedure: HARDWARE REMOVAL;  Surgeon: Wyn Forster., MD;  Location: [facility];  Service: Orthopedics;  Laterality: Left;  biceps tenolysis and screw removal   HERNIA REPAIR  2013   Hiatal Hernia Repair   JOINT REPLACEMENT     KNEE ARTHROSCOPY Left 7/13 2017   NISSEN FUNDOPLICATION  2010    ORIF HUMERUS FRACTURE  12/24/2011   Procedure: OPEN REDUCTION INTERNAL FIXATION (ORIF) PROXIMAL HUMERUS FRACTURE;  Surgeon: Wyn Forster., MD;  Location: [facility];  Service: Orthopedics;  Laterality: Left;  ORIF left humerus   SHOULDER ARTHROSCOPY Right 1954   SHOULDER ARTHROSCOPY Left 05/28/2011   x 2   spine fussion     x2   TOTAL KNEE ARTHROPLASTY Left 10/27/2022   Procedure: LEFT TOTAL KNEE ARTHROPLASTY;  Surgeon: Kathryne Hitch, MD;  Location: [facility];  Service: Orthopedics;  Laterality: Left;   TOTAL SHOULDER ARTHROPLASTY Right 2023   x2   Patient Active Problem List   Diagnosis Date Noted   Status post total left knee replacement 10/27/2022   Unilateral primary osteoarthritis, left knee 03/10/2022   Pain in right shoulder 08/21/2021   Pain in right hip 07/03/2020   Low back pain 07/03/2020   Status post laparoscopic fundoplication 07/24/2011   History of peptic ulcer 07/24/2011   Esophageal stricture 06/26/2011   H/O: upper GI bleed 06/26/2011   Edema extremities 06/26/2011   Chest pain 06/26/2011   Anemia due to blood loss, acute 06/26/2011   Syncope and collapse 06/11/2011   Melena 06/11/2011   Anemia 06/11/2011  Azotemia 06/11/2011   Volume depletion 06/11/2011   Leukocytosis 06/11/2011   DYSPHAGIA 08/02/2009   HIATAL HERNIA WITH REFLUX 01/30/2009   VITAMIN B12 DEFICIENCY 01/01/2009   BENIGN PROSTATIC HYPERTROPHY, WITH OBSTRUCTION 12/28/2008   FECAL INCONTINENCE 12/28/2008   GERD 12/27/2008   DIVERTICULOSIS, COLON 12/27/2008   RENAL CALCULUS 12/27/2008    PCP: Farris Has, MD  REFERRING PROVIDER: Doneen Poisson, MD  REFERRING DIAG: [phone] (ICD-10-CM) - Status post total left knee replacement   THERAPY DIAG:  Stiffness of left knee, not elsewhere classified  Muscle weakness (generalized)  Acute pain of left knee  Other abnormalities of gait and mobility  Unsteadiness on feet  Localized edema  Rationale for  Evaluation and Treatment: Rehabilitation  ONSET DATE: 10/27/2022 Left TKA sg  SUBJECTIVE:   SUBJECTIVE STATEMENT:  He relays he hopes he is turning the corner with pain levels, was not having pain on the way over to PT  PERTINENT HISTORY: OA, osteoporosis,  LBP, spine fusion, peptic ulcer, benign prostate hypertrophy with obstruction, diverticulosis, total shoulder arthroplasty X 2 on BUEs, left ORIF humeral fracture,   PAIN:  NPRS scale: 0 pain upon arrival today Pain location: left knee in joint, proximally where tourniquet was located medially, anteriorly, and laterally.  Pain description: ache Aggravating factors: not taking Oxy,  Relieving factors: meds, elevation, ice  PRECAUTIONS: Fall  WEIGHT BEARING RESTRICTIONS: Yes LLE WBAT  FALLS:  Has patient fallen in last 6 months? No  LIVING ENVIRONMENT: Lives with: lives with their spouse Lives in: House Stairs: single step with 2 grab bars to enter.  Home is 2 story but able to live on first floor.  Has following equipment at home: Single point cane, Walker - 2 wheeled, Grab bars, and built-in shower seat  OCCUPATION: retired.  PLOF: Independent  PATIENT GOALS:  golfing, being active in community.  Next MD visit: 12/09/2022  OBJECTIVE:  DIAGNOSTIC FINDINGS: 10/27/22 post surgery X-ray Postsurgical changes of left total knee arthroplasty. No evidence of immediate hardware complication.  PATIENT SURVEYS:  Eval / 11/16/2022: FOTO intake:  21%  predicted:  44%  COGNITION: Overall cognitive status: WFL    SENSATION: WFL  EDEMA:  Eval / 11/16/2022: LLE: above knee 51.5 cm,  around knee 52.7 cm, below knee 42.3 cm RLE: above knee 44.5 cm,  around knee 44 cm, below knee 37.8 cm  POSTURE: Eval / 11/16/2022: flexed trunk  and weight shift right  PALPATION: Eval / 11/16/2022:  Tenderness left knee around joint line, quadriceps muscle belly, ITB  LOWER EXTREMITY ROM:   ROM Left Eval 11/16/22 Left 11/19/2022 Left 11/30/22  Hip  flexion     Hip extension     Hip abduction     Hip adduction     Hip internal rotation     Hip external rotation     Knee flexion Seated A: 81* P: 84* Active assisted (with belt) 97 Supine P: 93* A: 88*  Knee extension Seated  LAQ A: -24* Supine Quad set -11* P: -10* Active -7 Supine P: -9* Seated  LAQ -11*  Ankle dorsiflexion     Ankle plantarflexion     Ankle inversion     Ankle eversion      (Blank rows = not tested)  LOWER EXTREMITY MMT:  MMT Left Eval 11/16/22  Hip flexion   Hip extension   Hip abduction   Hip adduction   Hip internal rotation   Hip external rotation   Knee flexion 3-/5  Knee extension 3-/5  Ankle dorsiflexion   Ankle plantarflexion   Ankle inversion   Ankle eversion    (Blank rows = not tested)  FUNCTIONAL TESTS:  Eval / 11/16/2022: 18 inch chair transfer: requires use of armrests to arise and RW to stabilize  GAIT: Eval / 11/16/2022: Distance walked: 100'  Assistive device utilized: Environmental consultant - 2 wheeled Level of assistance: SBA Comments: excessive UE weight bearing on RW, left knee flexed in stance with minimal increase for swing. Antalgic with decreased LLE stance duration.    TODAY'S TREATMENT                                                                          DATE: 12/01/2022: Therapeutic Exercise: SciFit bike seat 14 level 3 LEs only 8 min Gastroc stretch on slantboard 30 sec hold 3 reps Heel and toe raises BLEs 15 reps Tandem stance with intermittent touch 30 sec X 2 reps each foot in front Double Leg Press: 75# 16 reps 1 sets slow stretch into flexion and extension Single Leg Press: 37# 16 reps 1 sets slow stretch into flexion and extension Step ups 6 inch step ups- left leg up and RLE down controlled eccentric with bilat UE support 15 reps Standing hamstring stretch with left foot on 6 inch step 20 sec X 3 Supine knee extension heel prop stretch first 3 min of vaso.   Manual Therapy: Knee flexion and extension PROM with  overpressure flexion and extension and extension mobs grade 3-4  Vaso left knee 10 minutes Medium Pressure 34* with elevation  11/30/2022: Therapeutic Exercise: SciFit bike seat 14 level 3 LEs only 8 min Chair prop stretch with LLE ext in 2nd chair 5# wt hang with intermittent leg press for 2 min, knee flexed with foot at edge of 2nd chair 2 min, then 2nd set LLE ext in 2nd chair 5# wt hang with intermittent leg press for 2 min Gastroc stretch step heel depression 30 sec hold 2 reps Heel raises BLEs 15 reps Tandem stance on floor with LLE in front & in back with intermittent touch 30 sec ea Double Leg Press: 75# 15 reps 1 sets slow stretch into flexion and extension Single Leg Press: 37# 15 reps 1 sets slow stretch into flexion and extension Step ups 6 inch step AROM Left knee flexion, leading with left leg up and RLE down controlled eccentric with bilat UE support 10 reps Standing hamstring stretch with left foot on 6 inch step 20 sec X 3  Manual Therapy: Knee flexion and extension PROM with overpressure flexion seated, extension supine mobs grade 3  Gait Training Pt amb 75' X 2 with cane, he prefers to use it in his left hand due to his weakness in Rt hip, he tried it on each side and does appear to have more stability with cane in left hand so I think this is fine for him. PT recommended purchase new tip for cane.   Vaso left knee 10 minutes Medium Pressure 34* with elevation    11/26/2022: Therapeutic Exercise: Nu step L6-5 X 8 min Double Leg Press: 75# 10 reps 2 sets slow stretch into flexion and extension Single Leg Press: 31# 10 reps 2 sets slow stretch into flexion and  extension Step ups 6 inch step leading with left leg X 10 with bilat UE support Standing hamstring stretch with left foot on 6 inch step 20 sec X 3 Seated LAQ 3# 10 reps 2 sets. Seated knee flexion AAROM stretch from other leg, 5 sec X 15  Manual Therapy: Knee flexion and extension PROM with overpressure  seated, extension mobs grade 3  Gait Training Pt amb 75' X 2 with cane, he prefers to use it in his left hand due to his weakness in Rt hip, he tried it on each side and does appear to have more stability with cane in left hand so I think this is fine for him.  Vaso left knee 10 minutes Medium Pressure 34* (first 3 minutes with heel prop knee extension stretch)  11/23/2022: Therapeutic Exercise: SciFit bike with LEs only level 3 for 8 min. Double Leg Press: 75# 10 reps 2 sets slow stretch into flexion and extension Single Leg Press: 31# 10 reps 2 sets slow stretch into flexion and extension Standing LLE TKE red theraband 10 reps ea position 1st bilateral stance, 2nd set LLE initial contact position & 3rd set LLE terminal stance position. Seated LAQ 3# & active knee flexion with contralateral LE opposing motion 10 reps 2 sets.  Manual Therapy: PROM knee ext during rests on leg press bw sets Knee flexion PROM with overpressure seated  Gait Training Pt amb 75' X 2 with cane with no balance or knee instability issues. PT recommended initiating cane use in home where flat surfaces & limited distances. Pt verbalized understanding including pain as guide to tolerance for frequency.   Vaso left knee 10 minutes Medium Pressure 34*    HOME EXERCISE PROGRAM: Access Code: [email or web address] URL: [email or web address] Date: 11/16/2022 Prepared by: Vladimir Faster  Exercises - Ankle Alphabet in Elevation  - 2-4 x daily - 7 x weekly - 3 sets - 1 reps - Quad Setting and Stretching  - 2-4 x daily - 7 x weekly - 5-10 sets - 10 reps - 5 hold - Supine Knee Extension Strengthening  - 1 x daily - 5 x weekly - 1 sets - 10 reps - 5 seconds hold - Supine Heel Slide with Strap  - 2-3 x daily - 7 x weekly - 2-3 sets - 10 reps - 5 seconds hold - Supine Straight Leg Raises  - 2-3 x daily - 7 x weekly - 2-3 sets - 10 reps - 5 seconds hold - Seated Knee Flexion Extension AROM   - 2-4 x daily - 7 x weekly -  2-3 sets - 10 reps - 5 seconds hold - Seated straight leg lifts  - 2-3 x daily - 7 x weekly - 2-3 sets - 10 reps - 5 seconds hold - Seated Hamstring Stretch with Strap  - 2-4 x daily - 7 x weekly - 1 sets - 3 reps - 20-30 seconds hold - Seated Long Arc Quad  - 1 x daily - 5 x weekly - 1 sets - 10 reps - 5 seconds hold  ASSESSMENT: CLINICAL IMPRESSION: He is making progress with pain and function but still with limitations and will continue to benefit from PT. He had good tolerance to session today.    OBJECTIVE IMPAIRMENTS: Abnormal gait, decreased activity tolerance, decreased balance, decreased endurance, decreased knowledge of condition, decreased knowledge of use of DME, decreased mobility, difficulty walking, decreased ROM, decreased strength, increased edema, and pain.   ACTIVITY LIMITATIONS: carrying, lifting, bending, sitting, standing, squatting, sleeping,  stairs, transfers, and locomotion level  PARTICIPATION LIMITATIONS: meal prep, cleaning, laundry, driving, and community activity  PERSONAL FACTORS: Age, Fitness, Time since onset of injury/illness/exacerbation, and 1-2 comorbidities: see PMH  are also affecting patient's functional outcome.   REHAB POTENTIAL: Good  CLINICAL DECISION MAKING: Stable/uncomplicated  EVALUATION COMPLEXITY: Low   GOALS: Goals reviewed with patient? Yes  SHORT TERM GOALS: (target date for Short term goals 12/17/2022)   1.  Patient will demonstrate independent use of home exercise program to maintain progress from in clinic treatments.  Goal status: ongoing 11/19/2022  2. PROM left knee ext -2* and flexion 100*  Goal status: ongoing 11/19/2022  LONG TERM GOALS: (target dates for all long term goals  01/22/2023 )   1. Patient will demonstrate/report pain at worst less than or equal to 2/10 to facilitate minimal limitation in daily activity secondary to pain symptoms.  Goal status: ongoing 11/19/2022   2. Patient will demonstrate independent use of  home exercise program to facilitate ability to maintain/progress functional gains from skilled physical therapy services.  Goal status: ongoing 11/19/2022   3. Patient will demonstrate FOTO outcome > or = 44 % to indicate reduced disability due to condition.  Goal status: ongoing 11/17/2022   4.  Patient will demonstrate left LE MMT 5/5 throughout to faciltiate usual transfers, stairs, squatting at [facility] for daily life.   Goal status:  ongoing 11/17/2022   5.  Patient ambulates with cane >300' and negotiates ramps / curbs independently.  Goal status: ongoing 11/17/2022   6.  left Knee PROM 0* to 105* Goal status: ongoing 11/19/2022   7.  left knee AROM ext 0* and flexion 100* Goal Status: ongoing 11/19/2022  PLAN:  PT FREQUENCY:  2-3x/week  PT DURATION: 10 weeks  PLANNED INTERVENTIONS: Therapeutic exercises, Therapeutic activity, Neuro Muscular re-education, Balance training, Gait training, Patient/Family education, Joint mobilization, Stair training, DME instructions, Dry Needling, Electrical stimulation, Traction, Cryotherapy, vasopneumatic deviceMoist heat, Taping, Ultrasound, Ionotophoresis 4mg /ml Dexamethasone, and aquatic therapy, Manual therapy.  All included unless contraindicated  PLAN FOR NEXT SESSION: check if surgeon for hernia has limitations/ precautions. Continue to progress functional exercises & manual therapy for range /strength. Be aware of right inguinal hernia with exercises and he is having surgery 12/09/22.  Vaso for edema to end session.     April Manson, PT, DPT 12/01/2022, 3:25 PM

## 2022-12-01 NOTE — Progress Notes (Addendum)
Surgical Instructions    Your procedure is scheduled on 12/09/22.  Report to [facility] Main Entrance "A" at 5:30 A.M., then check in with the Admitting office.  Call this number if you have problems the morning of surgery:  [phone]   If you have any questions prior to your surgery date call [phone]: Open Monday-Friday 8am-4pm If you experience any cold or flu symptoms such as cough, fever, chills, shortness of breath, etc. between now and your scheduled surgery, please notify us at the above number     Remember:  Do not eat after midnight the night before your surgery  You may drink clear liquids until 4:30am the morning of your surgery.   Clear liquids allowed are: Water, Non-Citrus Juices (without pulp), Carbonated Beverages, Clear Tea, Black Coffee ONLY (NO MILK, CREAM OR POWDERED CREAMER of any kind), and Gatorade    Take these medicines the morning of surgery with A SIP OF WATER:  famotidine (PEPCID)  oxyCODONE (OXY IR/ROXICODONE) if needed   As of today, STOP taking any Aspirin (unless otherwise instructed by your surgeon) Aleve, Naproxen, Ibuprofen, Motrin, Advil, Goody's, BC's, all herbal medications, fish oil, and all vitamins.           Do not wear jewelry or makeup. Do not wear lotions, powders, perfumes/cologne or deodorant. Do not shave 48 hours prior to surgery.  Men may shave face and neck. Do not bring valuables to the hospital. Do not wear nail polish, gel polish, artificial nails, or any other type of covering on natural nails (fingers and toes) If you have artificial nails or gel coating that need to be removed by a nail salon, please have this removed prior to surgery. Artificial nails or gel coating may interfere with anesthesia's ability to adequately monitor your vital signs.  [facility] is not responsible for any belongings or valuables.    Do NOT Smoke (Tobacco/Vaping)  24 hours prior to your procedure  If you use a CPAP at night, you may bring  your mask for your overnight stay.   Contacts, glasses, hearing aids, dentures or partials may not be worn into surgery, please bring cases for these belongings   For patients admitted to the hospital, discharge time will be determined by your treatment team.   Patients discharged the day of surgery will not be allowed to drive home, and someone needs to stay with them for 24 hours.   SURGICAL WAITING ROOM VISITATION Patients having surgery or a procedure may have no more than 2 support people in the waiting area - these visitors may rotate.   Children under the age of 1 must have an adult with them who is not the patient. If the patient needs to stay at the hospital during part of their recovery, the visitor guidelines for inpatient rooms apply. Pre-op nurse will coordinate an appropriate time for 1 support person to accompany patient in pre-op.  This support person may not rotate.   Please refer to [email or web address] for the visitor guidelines for Inpatients (after your surgery is over and you are in a regular room).    Special instructions:    Oral Hygiene is also important to reduce your risk of infection.  Remember - BRUSH YOUR TEETH THE MORNING OF SURGERY WITH YOUR REGULAR TOOTHPASTE   [facility]- Preparing For Surgery  Before surgery, you can play an important role. Because skin is not sterile, your skin needs to be as free of germs as possible. You can reduce the  number of germs on your skin by washing with CHG (chlorahexidine gluconate) Soap before surgery.  CHG is an antiseptic cleaner which kills germs and bonds with the skin to continue killing germs even after washing.     Please do not use if you have an allergy to CHG or antibacterial soaps. If your skin becomes reddened/irritated stop using the CHG.  Do not shave (including legs and underarms) for at least 48 hours prior to first CHG shower. It is OK to shave your  face.  Please follow these instructions carefully.     Shower the NIGHT BEFORE SURGERY and the MORNING OF SURGERY with CHG Soap.   If you chose to wash your hair, wash your hair first as usual with your normal shampoo. After you shampoo, rinse your hair and body thoroughly to remove the shampoo.  Then [employer] and genitals (private parts) with your normal soap and rinse thoroughly to remove soap.  After that Use CHG Soap as you would any other liquid soap. You can apply CHG directly to the skin and wash gently with a scrungie or a clean washcloth.   Apply the CHG Soap to your body ONLY FROM THE NECK DOWN.  Do not use on open wounds or open sores. Avoid contact with your eyes, ears, mouth and genitals (private parts). Wash Face and genitals (private parts)  with your normal soap.   Wash thoroughly, paying special attention to the area where your surgery will be performed.  Thoroughly rinse your body with warm water from the neck down.  DO NOT shower/wash with your normal soap after using and rinsing off the CHG Soap.  Pat yourself dry with a CLEAN TOWEL.  Wear CLEAN PAJAMAS to bed the night before surgery  Place CLEAN SHEETS on your bed the night before your surgery  DO NOT SLEEP WITH PETS.   Day of Surgery:  Take a shower with CHG soap. Wear Clean/Comfortable clothing the morning of surgery Do not apply any deodorants/lotions.   Remember to brush your teeth WITH YOUR REGULAR TOOTHPASTE.    If you received a COVID test during your pre-op visit, it is requested that you wear a mask when out in public, stay away from anyone that may not be feeling well, and notify your surgeon if you develop symptoms. If you have been in contact with anyone that has tested positive in the last 10 days, please notify your surgeon.    Please read over the following fact sheets that you were given.

## 2022-12-02 ENCOUNTER — Encounter: Payer: Medicare PPO | Admitting: Physical Therapy

## 2022-12-02 ENCOUNTER — Other Ambulatory Visit: Payer: Self-pay | Admitting: Orthopaedic Surgery

## 2022-12-03 MED ORDER — OXYCODONE HCL 5 MG PO TABS: 5.0000 mg | ORAL_TABLET | Freq: Four times a day (QID) | ORAL | 0 refills | Status: DC | PRN

## 2022-12-08 ENCOUNTER — Encounter: Payer: Medicare PPO | Admitting: Physical Therapy

## 2022-12-09 ENCOUNTER — Encounter: Payer: Medicare PPO | Admitting: Orthopaedic Surgery

## 2022-12-09 ENCOUNTER — Ambulatory Visit (HOSPITAL_COMMUNITY): Payer: Medicare PPO | Admitting: Certified Registered Nurse Anesthetist

## 2022-12-09 ENCOUNTER — Other Ambulatory Visit: Payer: Self-pay

## 2022-12-09 ENCOUNTER — Encounter (HOSPITAL_COMMUNITY): Payer: Self-pay | Admitting: Surgery

## 2022-12-09 ENCOUNTER — Encounter (HOSPITAL_COMMUNITY)
Admission: RE | Disposition: A | Discharge status: Home / Self Care | Payer: Self-pay | Source: Home / Self Care | Attending: Surgery

## 2022-12-09 ENCOUNTER — Ambulatory Visit (HOSPITAL_BASED_OUTPATIENT_CLINIC_OR_DEPARTMENT_OTHER): Payer: Medicare PPO | Admitting: Certified Registered Nurse Anesthetist

## 2022-12-09 ENCOUNTER — Ambulatory Visit (HOSPITAL_COMMUNITY)
Admission: RE | Admit: 2022-12-09 | Discharge: 2022-12-09 | Disposition: A | Discharge status: Home / Self Care | Payer: Medicare PPO | Attending: Surgery | Admitting: Surgery

## 2022-12-09 DIAGNOSIS — M810 Age-related osteoporosis without current pathological fracture: Secondary | ICD-10-CM | POA: Insufficient documentation

## 2022-12-09 DIAGNOSIS — M1990 Unspecified osteoarthritis, unspecified site: Secondary | ICD-10-CM | POA: Insufficient documentation

## 2022-12-09 DIAGNOSIS — D176 Benign lipomatous neoplasm of spermatic cord: Secondary | ICD-10-CM | POA: Insufficient documentation

## 2022-12-09 DIAGNOSIS — D630 Anemia in neoplastic disease: Secondary | ICD-10-CM | POA: Diagnosis not present

## 2022-12-09 DIAGNOSIS — K4090 Unilateral inguinal hernia, without obstruction or gangrene, not specified as recurrent: Secondary | ICD-10-CM | POA: Insufficient documentation

## 2022-12-09 DIAGNOSIS — Z87891 Personal history of nicotine dependence: Secondary | ICD-10-CM | POA: Insufficient documentation

## 2022-12-09 DIAGNOSIS — K219 Gastro-esophageal reflux disease without esophagitis: Secondary | ICD-10-CM | POA: Insufficient documentation

## 2022-12-09 DIAGNOSIS — Z87442 Personal history of urinary calculi: Secondary | ICD-10-CM | POA: Insufficient documentation

## 2022-12-09 DIAGNOSIS — K449 Diaphragmatic hernia without obstruction or gangrene: Secondary | ICD-10-CM | POA: Insufficient documentation

## 2022-12-09 DIAGNOSIS — D649 Anemia, unspecified: Secondary | ICD-10-CM | POA: Insufficient documentation

## 2022-12-09 DIAGNOSIS — N400 Enlarged prostate without lower urinary tract symptoms: Secondary | ICD-10-CM | POA: Diagnosis not present

## 2022-12-09 DIAGNOSIS — D1779 Benign lipomatous neoplasm of other sites: Secondary | ICD-10-CM

## 2022-12-09 HISTORY — PX: INGUINAL HERNIA REPAIR: SHX194

## 2022-12-09 SURGERY — REPAIR, HERNIA, INGUINAL, ADULT
Anesthesia: General | Site: Groin | Laterality: Right

## 2022-12-09 MED ORDER — ONDANSETRON HCL 4 MG/2ML IJ SOLN: 4.0000 mg | Freq: Once | INTRAMUSCULAR | Status: DC | PRN

## 2022-12-09 MED ORDER — ONDANSETRON HCL 4 MG/2ML IJ SOLN
INTRAMUSCULAR | Status: DC | PRN
  Administered 2022-12-09: 4 mg via INTRAVENOUS

## 2022-12-09 MED ORDER — PROPOFOL 10 MG/ML IV BOLUS
INTRAVENOUS | Status: AC
  Filled 2022-12-09: qty 20

## 2022-12-09 MED ORDER — DOCUSATE SODIUM 100 MG PO CAPS: 100.0000 mg | ORAL_CAPSULE | Freq: Two times a day (BID) | ORAL | 0 refills | Status: AC

## 2022-12-09 MED ORDER — BUPIVACAINE LIPOSOME 1.3 % IJ SUSP
INTRAMUSCULAR | Status: DC | PRN
  Administered 2022-12-09: 20 mL

## 2022-12-09 MED ORDER — EPHEDRINE SULFATE-NACL 50-0.9 MG/10ML-% IV SOSY
PREFILLED_SYRINGE | INTRAVENOUS | Status: DC | PRN
  Administered 2022-12-09: 2.5 mg via INTRAVENOUS

## 2022-12-09 MED ORDER — CEFAZOLIN SODIUM-DEXTROSE 2-4 GM/100ML-% IV SOLN
2.0000 g | INTRAVENOUS | Status: AC
  Administered 2022-12-09: 2 g via INTRAVENOUS
  Filled 2022-12-09: qty 100

## 2022-12-09 MED ORDER — 0.9 % SODIUM CHLORIDE (POUR BTL) OPTIME
TOPICAL | Status: DC | PRN
  Administered 2022-12-09: 1000 mL

## 2022-12-09 MED ORDER — ACETAMINOPHEN 160 MG/5ML PO SOLN: 325.0000 mg | ORAL | Status: DC | PRN

## 2022-12-09 MED ORDER — ACETAMINOPHEN 325 MG PO TABS: 650.0000 mg | ORAL_TABLET | ORAL | Status: DC | PRN

## 2022-12-09 MED ORDER — LIDOCAINE 2% (20 MG/ML) 5 ML SYRINGE
INTRAMUSCULAR | Status: DC | PRN
  Administered 2022-12-09: 80 mg via INTRAVENOUS

## 2022-12-09 MED ORDER — SUGAMMADEX SODIUM 200 MG/2ML IV SOLN
INTRAVENOUS | Status: DC | PRN
  Administered 2022-12-09: 168.2 mg via INTRAVENOUS

## 2022-12-09 MED ORDER — BUPIVACAINE LIPOSOME 1.3 % IJ SUSP
INTRAMUSCULAR | Status: AC
  Filled 2022-12-09: qty 20

## 2022-12-09 MED ORDER — FENTANYL CITRATE (PF) 100 MCG/2ML IJ SOLN: 25.0000 ug | INTRAMUSCULAR | Status: DC | PRN

## 2022-12-09 MED ORDER — LIDOCAINE 2% (20 MG/ML) 5 ML SYRINGE
INTRAMUSCULAR | Status: AC
  Filled 2022-12-09: qty 5

## 2022-12-09 MED ORDER — ORAL CARE MOUTH RINSE: 15.0000 mL | Freq: Once | OROMUCOSAL | Status: AC

## 2022-12-09 MED ORDER — BUPIVACAINE-EPINEPHRINE (PF) 0.25% -1:200000 IJ SOLN
INTRAMUSCULAR | Status: AC
  Filled 2022-12-09: qty 30

## 2022-12-09 MED ORDER — CHLORHEXIDINE GLUCONATE 4 % EX SOLN: 60.0000 mL | Freq: Once | CUTANEOUS | Status: DC

## 2022-12-09 MED ORDER — BUPIVACAINE-EPINEPHRINE 0.25% -1:200000 IJ SOLN
INTRAMUSCULAR | Status: DC | PRN
  Administered 2022-12-09: 20 mL

## 2022-12-09 MED ORDER — ROCURONIUM BROMIDE 10 MG/ML (PF) SYRINGE
PREFILLED_SYRINGE | INTRAVENOUS | Status: DC | PRN
  Administered 2022-12-09: 30 mg via INTRAVENOUS
  Administered 2022-12-09: 10 mg via INTRAVENOUS

## 2022-12-09 MED ORDER — ACETAMINOPHEN 325 MG PO TABS: 325.0000 mg | ORAL_TABLET | ORAL | Status: DC | PRN

## 2022-12-09 MED ORDER — CHLORHEXIDINE GLUCONATE 0.12 % MT SOLN
15.0000 mL | Freq: Once | OROMUCOSAL | Status: AC
  Administered 2022-12-09: 15 mL via OROMUCOSAL
  Filled 2022-12-09: qty 15

## 2022-12-09 MED ORDER — FENTANYL CITRATE (PF) 250 MCG/5ML IJ SOLN
INTRAMUSCULAR | Status: DC | PRN
  Administered 2022-12-09 (×2): 50 ug via INTRAVENOUS

## 2022-12-09 MED ORDER — ROCURONIUM BROMIDE 10 MG/ML (PF) SYRINGE
PREFILLED_SYRINGE | INTRAVENOUS | Status: AC
  Filled 2022-12-09: qty 10

## 2022-12-09 MED ORDER — ACETAMINOPHEN 650 MG RE SUPP: 650.0000 mg | RECTAL | Status: DC | PRN

## 2022-12-09 MED ORDER — ONDANSETRON HCL 4 MG/2ML IJ SOLN
INTRAMUSCULAR | Status: AC
  Filled 2022-12-09: qty 2

## 2022-12-09 MED ORDER — OXYCODONE HCL 5 MG PO TABS: 5.0000 mg | ORAL_TABLET | Freq: Three times a day (TID) | ORAL | 0 refills | Status: AC | PRN

## 2022-12-09 MED ORDER — ACETAMINOPHEN 10 MG/ML IV SOLN
INTRAVENOUS | Status: AC
  Filled 2022-12-09: qty 100

## 2022-12-09 MED ORDER — PROPOFOL 10 MG/ML IV BOLUS
INTRAVENOUS | Status: DC | PRN
  Administered 2022-12-09: 170 mg via INTRAVENOUS

## 2022-12-09 MED ORDER — SUCCINYLCHOLINE CHLORIDE 200 MG/10ML IV SOSY
PREFILLED_SYRINGE | INTRAVENOUS | Status: AC
  Filled 2022-12-09: qty 10

## 2022-12-09 MED ORDER — OXYCODONE HCL 5 MG PO TABS: 5.0000 mg | ORAL_TABLET | ORAL | Status: DC | PRN

## 2022-12-09 MED ORDER — PROPOFOL 500 MG/50ML IV EMUL
INTRAVENOUS | Status: DC | PRN
  Administered 2022-12-09: 50 ug/kg/min via INTRAVENOUS

## 2022-12-09 MED ORDER — SUCCINYLCHOLINE CHLORIDE 200 MG/10ML IV SOSY
PREFILLED_SYRINGE | INTRAVENOUS | Status: DC | PRN
  Administered 2022-12-09: 100 mg via INTRAVENOUS

## 2022-12-09 MED ORDER — LACTATED RINGERS IV SOLN: INTRAVENOUS | Status: DC

## 2022-12-09 MED ORDER — ACETAMINOPHEN 10 MG/ML IV SOLN: 1000.0000 mg | Freq: Once | INTRAVENOUS | Status: DC | PRN

## 2022-12-09 MED ORDER — ACETAMINOPHEN 500 MG PO TABS
1000.0000 mg | ORAL_TABLET | ORAL | Status: AC
  Administered 2022-12-09: 1000 mg via ORAL
  Filled 2022-12-09: qty 2

## 2022-12-09 MED ORDER — FENTANYL CITRATE (PF) 100 MCG/2ML IJ SOLN
INTRAMUSCULAR | Status: AC
  Filled 2022-12-09: qty 2

## 2022-12-09 MED ORDER — BUPIVACAINE LIPOSOME 1.3 % IJ SUSP: 20.0000 mL | Freq: Once | INTRAMUSCULAR | Status: DC

## 2022-12-09 SURGICAL SUPPLY — 49 items
APL PRP STRL LF DISP 70% ISPRP (MISCELLANEOUS) ×1
APL SKNCLS STERI-STRIP NONHPOA (GAUZE/BANDAGES/DRESSINGS) ×1
BAG COUNTER SPONGE SURGICOUNT (BAG) ×1 IMPLANT
BAG SPNG CNTER NS LX DISP (BAG)
BENZOIN TINCTURE PRP APPL 2/3 (GAUZE/BANDAGES/DRESSINGS) ×1 IMPLANT
BLADE CLIPPER SURG (BLADE) IMPLANT
CANISTER SUCT 3000ML PPV (MISCELLANEOUS) IMPLANT
CHLORAPREP W/TINT 26 (MISCELLANEOUS) ×1 IMPLANT
COVER SURGICAL LIGHT HANDLE (MISCELLANEOUS) ×1 IMPLANT
DRAIN PENROSE .5X12 LATEX STL (DRAIN) IMPLANT
DRAPE LAPAROTOMY TRNSV 102X78 (DRAPES) IMPLANT
DRSG TEGADERM 4X4.75 (GAUZE/BANDAGES/DRESSINGS) ×1 IMPLANT
ELECT REM PT RETURN 9FT ADLT (ELECTROSURGICAL) ×1
ELECTRODE REM PT RTRN 9FT ADLT (ELECTROSURGICAL) ×1 IMPLANT
GAUZE 4X4 16PLY ~~LOC~~+RFID DBL (SPONGE) ×1 IMPLANT
GAUZE SPONGE 4X4 12PLY STRL (GAUZE/BANDAGES/DRESSINGS) ×1 IMPLANT
GLOVE BIO SURGEON STRL SZ 6 (GLOVE) ×1 IMPLANT
GLOVE INDICATOR 6.5 STRL GRN (GLOVE) ×1 IMPLANT
GOWN STRL REUS W/ TWL LRG LVL3 (GOWN DISPOSABLE) ×2 IMPLANT
GOWN STRL REUS W/TWL LRG LVL3 (GOWN DISPOSABLE) ×2
KIT BASIN OR (CUSTOM PROCEDURE TRAY) ×1 IMPLANT
KIT TURNOVER KIT B (KITS) ×1 IMPLANT
MESH ULTRAPRO 3X6 7.6X15CM (Mesh General) IMPLANT
NDL HYPO 22X1.5 SAFETY MO (MISCELLANEOUS) IMPLANT
NDL HYPO 25GX1X1/2 BEV (NEEDLE) ×1 IMPLANT
NEEDLE HYPO 22X1.5 SAFETY MO (MISCELLANEOUS) ×1 IMPLANT
NEEDLE HYPO 25GX1X1/2 BEV (NEEDLE) ×1 IMPLANT
NS IRRIG 1000ML POUR BTL (IV SOLUTION) ×1 IMPLANT
PACK GENERAL/GYN (CUSTOM PROCEDURE TRAY) ×1 IMPLANT
PAD ARMBOARD 7.5X6 YLW CONV (MISCELLANEOUS) ×1 IMPLANT
PENCIL SMOKE EVACUATOR (MISCELLANEOUS) ×1 IMPLANT
STRIP CLOSURE SKIN 1/2X4 (GAUZE/BANDAGES/DRESSINGS) ×1 IMPLANT
SUT ETHIBOND 0 MO6 C/R (SUTURE) ×1 IMPLANT
SUT MNCRL AB 4-0 PS2 18 (SUTURE) ×1 IMPLANT
SUT PDS AB 0 CT1 27 (SUTURE) IMPLANT
SUT PDS AB 0 CTX 60 (SUTURE) IMPLANT
SUT SILK 3 0 (SUTURE)
SUT SILK 3-0 18XBRD TIE 12 (SUTURE) IMPLANT
SUT VIC AB 0 CT2 27 (SUTURE) ×1 IMPLANT
SUT VIC AB 2-0 SH 27 (SUTURE) ×1
SUT VIC AB 2-0 SH 27X BRD (SUTURE) ×1 IMPLANT
SUT VIC AB 3-0 SH 27 (SUTURE) ×1
SUT VIC AB 3-0 SH 27XBRD (SUTURE) ×1 IMPLANT
SUT VICRYL AB 3 0 TIES (SUTURE) ×1 IMPLANT
SYR CONTROL 10ML LL (SYRINGE) ×1 IMPLANT
TOWEL GREEN STERILE (TOWEL DISPOSABLE) ×1 IMPLANT
TOWEL GREEN STERILE FF (TOWEL DISPOSABLE) ×1 IMPLANT
TRAY FOLEY W/BAG SLVR 16FR (SET/KITS/TRAYS/PACK)
TRAY FOLEY W/BAG SLVR 16FR ST (SET/KITS/TRAYS/PACK) IMPLANT

## 2022-12-09 NOTE — Anesthesia Preprocedure Evaluation (Addendum)
Anesthesia Evaluation  Patient identified by MRN, date of birth, ID band Patient awake    Reviewed: Allergy & Precautions, NPO status , Patient's Chart, lab work & pertinent test results  Airway Mallampati: I       Dental no notable dental hx.    Pulmonary former smoker   Pulmonary exam normal        Cardiovascular Normal cardiovascular exam     Neuro/Psych  negative psych ROS   GI/Hepatic ,GERD  Medicated,,  Endo/Other    Renal/GU   negative genitourinary   Musculoskeletal   Abdominal Normal abdominal exam  (+)   Peds  Hematology  (+) Blood dyscrasia, anemia   Anesthesia Other Findings   Reproductive/Obstetrics                             Anesthesia Physical Anesthesia Plan  ASA: 2  Anesthesia Plan: General   Post-op Pain Management: Minimal or no pain anticipated   Induction: Intravenous  PONV Risk Score and Plan: 3 and Ondansetron  Airway Management Planned:   Additional Equipment: None  Intra-op Plan:   Post-operative Plan: Extubation in OR  Informed Consent: I have reviewed the patients History and Physical, chart, labs and discussed the procedure including the risks, benefits and alternatives for the proposed anesthesia with the patient or authorized representative who has indicated his/her understanding and acceptance.     Dental advisory given  Plan Discussed with: CRNA  Anesthesia Plan Comments:        Anesthesia Quick Evaluation

## 2022-12-09 NOTE — Transfer of Care (Signed)
Immediate Anesthesia Transfer of Care Note  Patient: Lucas Shepherd  Procedure(s) Performed: OPEN RIGHT HERNIA REPAIR INGUINAL ADULT (Right: Groin)  Patient Location: PACU  Anesthesia Type:General  Level of Consciousness: awake, alert , and oriented  Airway & Oxygen Therapy: Patient Spontanous Breathing and Patient connected to nasal cannula oxygen  Post-op Assessment: Report given to RN, Post -op Vital signs reviewed and stable, Patient moving all extremities X 4, and Patient able to stick tongue midline  Post vital signs: Reviewed  Last Vitals:  Vitals Value Taken Time  BP 151/71   Temp 97.6   Pulse 72 12/09/22 0841  Resp 14   SpO2 98 % 12/09/22 0841  Vitals shown include unvalidated device data.  Last Pain:  Vitals:   12/09/22 0614  PainSc: 1       Patients Stated Pain Goal: 0 (12/09/22 1610)  Complications: No notable events documented.

## 2022-12-09 NOTE — Anesthesia Postprocedure Evaluation (Signed)
Anesthesia Post Note  Patient: Lucas Shepherd  Procedure(s) Performed: OPEN RIGHT HERNIA REPAIR INGUINAL ADULT (Right: Groin)     Patient location during evaluation: PACU Anesthesia Type: General Level of consciousness: awake Pain management: pain level controlled Vital Signs Assessment: post-procedure vital signs reviewed and stable Respiratory status: spontaneous breathing Cardiovascular status: stable Postop Assessment: no apparent nausea or vomiting Anesthetic complications: no  No notable events documented.  Last Vitals:  Vitals:   12/09/22 0902 12/09/22 0917  BP: (!) 164/77 (!) 166/80  Pulse: 63 65  Resp: 17 13  Temp:  36.5 C  SpO2: 99% 96%    Last Pain:  Vitals:   12/09/22 0917  PainSc: 0-No pain                 Caren Macadam

## 2022-12-09 NOTE — Anesthesia Procedure Notes (Signed)
Procedure Name: Intubation Date/Time: 12/09/2022 7:40 AM  Performed by: Cy Blamer, CRNAPre-anesthesia Checklist: Patient identified, Emergency Drugs available, Suction available and Patient being monitored Patient Re-evaluated:Patient Re-evaluated prior to induction Oxygen Delivery Method: Circle system utilized Preoxygenation: Pre-oxygenation with 100% oxygen Induction Type: IV induction and Rapid sequence Laryngoscope Size: Miller and 2 Grade View: Grade I Tube type: Oral Tube size: 7.5 mm Number of attempts: 1 Airway Equipment and Method: Stylet and Bite block Placement Confirmation: ETT inserted through vocal cords under direct vision, positive ETCO2 and breath sounds checked- equal and bilateral Secured at: 23 cm Tube secured with: Tape Dental Injury: Teeth and Oropharynx as per pre-operative assessment  Comments: Elective RSI for h/o Nissen

## 2022-12-09 NOTE — Interval H&P Note (Signed)
History and Physical Interval Note:  12/09/2022 6:46 AM  Lucas Shepherd  has presented today for surgery, with the diagnosis of HERNIA.  The various methods of treatment have been discussed with the patient and family. After consideration of risks, benefits and other options for treatment, the patient has consented to  Procedure(s): OPEN RIGHT HERNIA REPAIR INGUINAL ADULT (Right) as a surgical intervention.  The patient's history has been reviewed, patient examined, no change in status, stable for surgery.  I have reviewed the patient's chart and labs.  Questions were answered to the patient's satisfaction.     [name]

## 2022-12-09 NOTE — Discharge Instructions (Signed)
HERNIA REPAIR: POST OP INSTRUCTIONS   EAT Gradually transition to a high fiber diet with a fiber supplement over the next few weeks after discharge.  Start with a pureed / full liquid diet (see below)  WALK Walk an hour a day (cumulative- not all at once).  Control your pain to do that.    CONTROL PAIN Control pain so that you can walk, sleep, tolerate sneezing/coughing, and go up/down stairs.  HAVE A BOWEL MOVEMENT DAILY Keep your bowels regular to avoid problems.  OK to try a laxative to override constipation.  OK to use an antidiarrheal to slow down diarrhea.  Call if not better after 2 tries  CALL IF YOU HAVE PROBLEMS/CONCERNS Call if you are still struggling despite following these instructions. Call if you have concerns not answered by these instructions  ######################################################################    DIET: Follow a light bland diet & liquids the first 24 hours after arrival home, such as soup, liquids, starches, etc.  Be sure to drink plenty of fluids.  Quickly advance to a usual solid diet within a few days.  Avoid fast food or heavy meals initially as you are more likely to get nauseated or have irregular bowels.   Take your usually prescribed home medications unless otherwise directed.  PAIN CONTROL: Pain is best controlled by a usual combination of three different methods TOGETHER: Ice/Heat Over the counter pain medication Prescription pain medication Most patients will experience some swelling and bruising around the hernia(s) such as the bellybutton, groins, or old incisions.  Ice packs or heating pads (30-60 minutes up to 6 times a day) will help. Use ice for the first few days to help decrease swelling and bruising, then switch to heat to help relax tight/sore spots and speed recovery.  Some people prefer to use ice alone, heat alone, alternating between ice & heat.  Experiment to what works for you.  Swelling and bruising can take several  weeks to resolve.   It is helpful to take an over-the-counter pain medication regularly for the first days: Naproxen (Aleve, etc)  Two 220mg  tabs twice a day OR Ibuprofen (Advil, etc) Three 200mg  tabs four times a day (every meal & bedtime) AND Acetaminophen (Tylenol, etc) 325-650mg  four times a day (every meal & bedtime) A  prescription for pain medication should be given to you upon discharge.  Take your pain medication as prescribed, IF NEEDED.  If you are having problems/concerns with the prescription medicine (does not control pain, nausea, vomiting, rash, itching, etc), please call us [phone] to see if we need to switch you to a different pain medicine that will work better for you and/or control your side effect better. If you need a refill on your pain medication, please contact your pharmacy.  They will contact our office to request authorization. Prescriptions will not be filled after 5 pm or on week-ends.  Avoid getting constipated.  Between the surgery and the pain medications, it is common to experience some constipation.  Increasing fluid intake and taking a fiber supplement (such as Metamucil, Citrucel, FiberCon, MiraLax, etc) 1-2 times a day regularly will usually help prevent this problem from occurring.  A mild laxative (prune juice, Milk of Magnesia, MiraLax, etc) should be taken according to package directions if there are no bowel movements after 48 hours.    Wash / shower every day, starting 2 days after surgery.  You may shower over the steri strips which are waterproof.  No rubbing, scrubbing, lotions or ointments  to incision(s). Do not soak or submerge.   Remove your outer bandage 2 days after surgery. Steri strips will peel off after 1-2 weeks.  You may leave the incision open to air.  You may replace a dressing/Band-Aid to cover an incision for comfort if you wish.  Continue to shower over incision(s) after the dressing is off.  ACTIVITIES as tolerated:   You may  resume regular (light) daily activities beginning the next day--such as daily self-care, walking, climbing stairs--gradually increasing activities as tolerated.  Control your pain so that you can walk an hour a day.  If you can walk 30 minutes without difficulty, it is safe to try more intense activity such as jogging, treadmill, bicycling, low-impact aerobics, swimming, etc. Refrain from the most intensive and strenuous activity such as sit-ups, heavy lifting, contact sports, etc  Refrain from any heavy lifting or straining until 6 weeks after surgery.   OK to continue your physical therapy exercises as prescribed for your knee.  DO NOT PUSH THROUGH PAIN.  Let pain be your guide: If it hurts to do something, don't do it.  Pain is your body warning you to avoid that activity for another week until the pain goes down. You may drive when you are no longer taking prescription pain medication, you can comfortably wear a seatbelt, and you can safely maneuver your car and apply brakes. You may have sexual intercourse when it is comfortable.   FOLLOW UP in our office Please call [facility] at [phone] to set up an appointment to see your surgeon in the office for a follow-up appointment approximately 2-3 weeks after your surgery. Make sure that you call for this appointment the day you arrive home to insure a convenient appointment time.  9.  If you have disability of FMLA / Family leave forms, please bring the forms to the office for processing.  (do not give to your surgeon).  WHEN TO CALL us [phone]: Poor pain control Reactions / problems with new medications (rash/itching, nausea, etc)  Fever over 101.5 F (38.5 C) Inability to urinate Nausea and/or vomiting Worsening swelling or bruising Continued bleeding from incision. Increased pain, redness, or drainage from the incision   The clinic staff is available to answer your questions during regular business hours (8:30am-5pm).  Please don't  hesitate to call and ask to speak to one of our nurses for clinical concerns.   If you have a medical emergency, go to the nearest emergency room or call 911.  A surgeon from [facility] is always on call at the hospitals in [facility] [place] [address], [address], [place], [place]  [address] ?  [address] [address], [place], [place]   [address] MAIN: [phone] ? TOLL FREE: [phone] ? FAX: [phone] [email or web address]

## 2022-12-09 NOTE — Interval H&P Note (Signed)
History and Physical Interval Note:  12/09/2022 6:46 AM  Lucas Shepherd  has presented today for surgery, with the diagnosis of HERNIA.  The various methods of treatment have been discussed with the patient and family. After consideration of risks, benefits and other options for treatment, the patient has consented to  Procedure(s): OPEN RIGHT HERNIA REPAIR INGUINAL ADULT (Right) as a surgical intervention.  The patient's history has been reviewed, patient examined, no change in status, stable for surgery.  I have reviewed the patient's chart and labs.  Questions were answered to the patient's satisfaction.     Lucas Shepherd   

## 2022-12-09 NOTE — Op Note (Signed)
Operative Note  STRATON RUMPLE  [id number]  [id number]  12/09/2022   Surgeon: Berna Bue MD FACS   Procedure performed: Open right inguinal hernia repair with mesh  Assistant: Evern Core MD ([id number])   [facility]) performed the entirety of this procedure as documented in my operative note.    Preop diagnosis:  right inguinal hernia   Post-op diagnosis/intraop findings:  Indirect and direct right inguinal hernia with cord lipoma   Specimens: none   EBL: 5cc   Complications: none   Description of procedure: After confirming informed consent, the patient was taken to the operating room and placed supine on operating room table where general anesthesia was initiated, preoperative antibiotics were administered, SCDs applied, and a formal timeout was performed. The groin was prepped and draped in the usual sterile fashion. An oblique incision was made the just above the inguinal ligament after infiltrating the tissues with local anesthetic (Exparel mixed with quarter percent Marcaine with epinephrine). Soft tissues were dissected using electrocautery until the external oblique aponeurosis was encountered. This was divided sharply to expand the external ring. A plane was bluntly developed between the spermatic cord and the external oblique. The ilioinguinal nerve was divided between hemostats and each and ligated with 3-0 Vicryl ties. The spermatic cord was then bluntly dissected away from the pubic tubercle and encircled with a Penrose. Inspection of the inguinal anatomy revealed a moderate indirect hernia sac, a moderate cord lipoma, and a direct hernia defect with essentially disruption of the inguinal floor. The indirect hernia sac was bluntly dissected away from the cord structures and skeletonized to the level of the internal ring, where it was reduced intact into the abdomen.  The inguinal floor was reconstructed and the direct defect closed suturing the conjoined tendon to the  inguinal ligament with interrupted 2-0 PDS, leaving an internal ring just sufficient for the cord structures. A 3 x 6 piece of ultra Pro mesh was brought onto the field and trimmed to approximate the field. This was sutured to the pubic tubercle fascia, inferior shelving edge and to the internal oblique superiorly with interrupted 0 ethibonds. The tails of the mesh were wrapped around the spermatic cord, ensuring adequate room for the cord, and sutured to each other with 0 ethibond, and then directed laterally to lie flat beneath the external oblique aponeurosis. Hemostasis was ensured within the wound. The Penrose was removed. The external oblique aponeurosis was reapproximated with a running 3-0 Vicryl to re-create a narrowed external ring. More local was infiltrated around the pubic tubercle and in the plane just below the external oblique. The Scarpa's was reapproximated with interrupted 3-0 Vicryls. The skin was closed with a running subcuticular 4-0 Monocryl. The remainder of the local was injected in the subcutaneous and subcuticular space. The field was then cleaned, benzoin and Steri-Strips and sterile bandage were applied. The patient was then awakened extubated and taken to PACU in stable condition.    All counts were correct at the completion of the case

## 2022-12-10 ENCOUNTER — Encounter: Payer: Medicare PPO | Admitting: Physical Therapy

## 2022-12-10 ENCOUNTER — Encounter (HOSPITAL_COMMUNITY): Payer: Self-pay | Admitting: Surgery

## 2022-12-10 NOTE — Therapy (Signed)
OUTPATIENT PHYSICAL THERAPY LOWER EXTREMITY TREATMENT   Patient Name: Lucas Shepherd MRN: [id number] DOB:Apr 23, 1929, 89 y.o., male Today's Date: 12/11/2022  END OF SESSION:  PT End of Session - 12/11/22 1540     Visit Number 8    Number of Visits 22    Date for PT Re-Evaluation 01/22/23    Authorization Type [employer]    Authorization Time Period $20 COPAY Approved  Authorization #[id number]  Tracking #[id number]  12 PT VISITS 5/6-7/12/24    Authorization - Number of Visits 12    Progress Note Due on Visit 10    PT Start Time 1145    PT Stop Time 1235    PT Time Calculation (min) 50 min    Activity Tolerance Patient tolerated treatment well;No increased pain    Behavior During Therapy WFL for tasks assessed/performed               Past Medical History:  Diagnosis Date   Anemia    related to past gastric ulcers   Arthritis    Blood transfusion    BPH (benign prostatic hyperplasia)    External hemorrhoids    GERD (gastroesophageal reflux disease)    nisseum fundoplication 2010   GI bleed 06/2011   got 3 units blood   Hiatal hernia    History of kidney stones    History of TMJ syndrome    mild   History of urinary frequency    Osteoporosis    Urinary urgency    Past Surgical History:  Procedure Laterality Date   ABDOMINAL SURGERY  1985   tummy tuc   CATARACT EXTRACTION Bilateral    COLONOSCOPY     ESOPHAGOGASTRODUODENOSCOPY  06/12/2011   Procedure: ESOPHAGOGASTRODUODENOSCOPY (EGD);  Surgeon: Yancey Flemings, MD;  Location: [facility];  Service: Endoscopy;  Laterality: N/A;   HARDWARE REMOVAL  02/23/2012   Procedure: HARDWARE REMOVAL;  Surgeon: Wyn Forster., MD;  Location: [facility];  Service: Orthopedics;  Laterality: Left;  biceps tenolysis and screw removal   HERNIA REPAIR  2013   Hiatal Hernia Repair   INGUINAL HERNIA REPAIR Right 12/09/2022   Procedure: OPEN RIGHT HERNIA REPAIR INGUINAL ADULT;  Surgeon: Berna Bue,  MD;  Location: [facility] OR;  Service: General;  Laterality: Right;   JOINT REPLACEMENT     KNEE ARTHROSCOPY Left 7/13 2017   NISSEN FUNDOPLICATION  2010   ORIF HUMERUS FRACTURE  12/24/2011   Procedure: OPEN REDUCTION INTERNAL FIXATION (ORIF) PROXIMAL HUMERUS FRACTURE;  Surgeon: Wyn Forster., MD;  Location: [facility];  Service: Orthopedics;  Laterality: Left;  ORIF left humerus   SHOULDER ARTHROSCOPY Right 1954   SHOULDER ARTHROSCOPY Left 05/28/2011   x 2   spine fussion     x2   TOTAL KNEE ARTHROPLASTY Left 10/27/2022   Procedure: LEFT TOTAL KNEE ARTHROPLASTY;  Surgeon: Kathryne Hitch, MD;  Location: [facility] OR;  Service: Orthopedics;  Laterality: Left;   TOTAL SHOULDER ARTHROPLASTY Right 2023   x2   Patient Active Problem List   Diagnosis Date Noted   Status post total left knee replacement 10/27/2022   Unilateral primary osteoarthritis, left knee 03/10/2022   Pain in right shoulder 08/21/2021   Pain in right hip 07/03/2020   Low back pain 07/03/2020   Status post laparoscopic fundoplication 07/24/2011   History of peptic ulcer 07/24/2011   Esophageal stricture 06/26/2011   H/O: upper GI bleed 06/26/2011   Edema extremities 06/26/2011   Chest  pain 06/26/2011   Anemia due to blood loss, acute 06/26/2011   Syncope and collapse 06/11/2011   Melena 06/11/2011   Anemia 06/11/2011   Azotemia 06/11/2011   Volume depletion 06/11/2011   Leukocytosis 06/11/2011   DYSPHAGIA 08/02/2009   HIATAL HERNIA WITH REFLUX 01/30/2009   VITAMIN B12 DEFICIENCY 01/01/2009   BENIGN PROSTATIC HYPERTROPHY, WITH OBSTRUCTION 12/28/2008   FECAL INCONTINENCE 12/28/2008   GERD 12/27/2008   DIVERTICULOSIS, COLON 12/27/2008   RENAL CALCULUS 12/27/2008    PCP: Farris Has, MD  REFERRING PROVIDER: Doneen Poisson, MD  REFERRING DIAG: [phone] (ICD-10-CM) - Status post total left knee replacement   THERAPY DIAG:  Stiffness of left knee, not elsewhere classified  Muscle  weakness (generalized)  Acute pain of left knee  Other abnormalities of gait and mobility  Unsteadiness on feet  Localized edema  Rationale for Evaluation and Treatment: Rehabilitation  ONSET DATE: 10/27/2022 Left TKA sg  SUBJECTIVE:   SUBJECTIVE STATEMENT:  Pt states that he is doing ok. He doesn't think he has any limitations following his hernia repair.   PERTINENT HISTORY: OA, osteoporosis,  LBP, spine fusion, peptic ulcer, benign prostate hypertrophy with obstruction, diverticulosis, total shoulder arthroplasty X 2 on BUEs, left ORIF humeral fracture,   PAIN:  NPRS scale: 0 pain upon arrival today Pain location: left knee in joint, proximally where tourniquet was located medially, anteriorly, and laterally.  Pain description: ache Aggravating factors: not taking Oxy,  Relieving factors: meds, elevation, ice  PRECAUTIONS: Fall  WEIGHT BEARING RESTRICTIONS: Yes LLE WBAT  FALLS:  Has patient fallen in last 6 months? No  LIVING ENVIRONMENT: Lives with: lives with their spouse Lives in: House Stairs: single step with 2 grab bars to enter.  Home is 2 story but able to live on first floor.  Has following equipment at home: Single point cane, Walker - 2 wheeled, Grab bars, and built-in shower seat  OCCUPATION: retired.  PLOF: Independent  PATIENT GOALS:  golfing, being active in community.  Next MD visit: 12/09/2022  OBJECTIVE:  DIAGNOSTIC FINDINGS: 10/27/22 post surgery X-ray Postsurgical changes of left total knee arthroplasty. No evidence of immediate hardware complication.  PATIENT SURVEYS:  Eval / 11/16/2022: FOTO intake:  21%  predicted:  44%  COGNITION: Overall cognitive status: WFL    SENSATION: WFL  EDEMA:  Eval / 11/16/2022: LLE: above knee 51.5 cm,  around knee 52.7 cm, below knee 42.3 cm RLE: above knee 44.5 cm,  around knee 44 cm, below knee 37.8 cm  POSTURE: Eval / 11/16/2022: flexed trunk  and weight shift right  PALPATION: Eval / 11/16/2022:   Tenderness left knee around joint line, quadriceps muscle belly, ITB  LOWER EXTREMITY ROM:   ROM Left Eval 11/16/22 Left 11/19/2022 Left 11/30/22  Hip flexion     Hip extension     Hip abduction     Hip adduction     Hip internal rotation     Hip external rotation     Knee flexion Seated A: 81* P: 84* Active assisted (with belt) 97 Supine P: 93* A: 88*  Knee extension Seated  LAQ A: -24* Supine Quad set -11* P: -10* Active -7 Supine P: -9* Seated  LAQ -11*  Ankle dorsiflexion     Ankle plantarflexion     Ankle inversion     Ankle eversion      (Blank rows = not tested) 12/11/22: supine Lt knee flexion 110 deg, extension -5 deg  LOWER EXTREMITY MMT:  MMT Left Eval 11/16/22  Hip flexion   Hip extension   Hip abduction   Hip adduction   Hip internal rotation   Hip external rotation   Knee flexion 3-/5  Knee extension 3-/5  Ankle dorsiflexion   Ankle plantarflexion   Ankle inversion   Ankle eversion    (Blank rows = not tested)  FUNCTIONAL TESTS:  Eval / 11/16/2022: 18 inch chair transfer: requires use of armrests to arise and RW to stabilize  GAIT: Eval / 11/16/2022: Distance walked: 100'  Assistive device utilized: Environmental consultant - 2 wheeled Level of assistance: SBA Comments: excessive UE weight bearing on RW, left knee flexed in stance with minimal increase for swing. Antalgic with decreased LLE stance duration.    TODAY'S TREATMENT                                                                          DATE: 12/11/22: Recumbent bike able to reach full revolutions after warmup, seat 9, L1 x5 min Double leg press: #75 x20 reps, slow eccentric for knee flexion stretch Single leg press: #75 x10 reps, slow eccentric for knee flexion stretch Standing heel raises weight shifted Lt x15 reps Manual: Lt knee extension stretch with overpressure 10x5 sec hold, Lt knee flexion slide with overpressure 5x5 sec hold, scar mobilization to decrease adhesions Vaso left knee 10  minutes Medium Pressure 34* with elevation  12/01/2022: Therapeutic Exercise: SciFit bike seat 14 level 3 LEs only 8 min Gastroc stretch on slantboard 30 sec hold 3 reps Heel and toe raises BLEs 15 reps Tandem stance with intermittent touch 30 sec X 2 reps each foot in front Double Leg Press: 75# 16 reps 1 sets slow stretch into flexion and extension Single Leg Press: 37# 16 reps 1 sets slow stretch into flexion and extension Step ups 6 inch step ups- left leg up and RLE down controlled eccentric with bilat UE support 15 reps Standing hamstring stretch with left foot on 6 inch step 20 sec X 3 Supine knee extension heel prop stretch first 3 min of vaso.   Manual Therapy: Knee flexion and extension PROM with overpressure flexion and extension and extension mobs grade 3-4  Vaso left knee 10 minutes Medium Pressure 34* with elevation  11/30/2022: Therapeutic Exercise: SciFit bike seat 14 level 3 LEs only 8 min Chair prop stretch with LLE ext in 2nd chair 5# wt hang with intermittent leg press for 2 min, knee flexed with foot at edge of 2nd chair 2 min, then 2nd set LLE ext in 2nd chair 5# wt hang with intermittent leg press for 2 min Gastroc stretch step heel depression 30 sec hold 2 reps Heel raises BLEs 15 reps Tandem stance on floor with LLE in front & in back with intermittent touch 30 sec ea Double Leg Press: 75# 15 reps 1 sets slow stretch into flexion and extension Single Leg Press: 37# 15 reps 1 sets slow stretch into flexion and extension Step ups 6 inch step AROM Left knee flexion, leading with left leg up and RLE down controlled eccentric with bilat UE support 10 reps Standing hamstring stretch with left foot on 6 inch step 20 sec X 3  Manual Therapy: Knee flexion and extension PROM with overpressure flexion  seated, extension supine mobs grade 3  Gait Training Pt amb 75' X 2 with cane, he prefers to use it in his left hand due to his weakness in Rt hip, he tried it on each  side and does appear to have more stability with cane in left hand so I think this is fine for him. PT recommended purchase new tip for cane.   Vaso left knee 10 minutes Medium Pressure 34* with elevation    11/26/2022: Therapeutic Exercise: Nu step L6-5 X 8 min Double Leg Press: 75# 10 reps 2 sets slow stretch into flexion and extension Single Leg Press: 31# 10 reps 2 sets slow stretch into flexion and extension Step ups 6 inch step leading with left leg X 10 with bilat UE support Standing hamstring stretch with left foot on 6 inch step 20 sec X 3 Seated LAQ 3# 10 reps 2 sets. Seated knee flexion AAROM stretch from other leg, 5 sec X 15  Manual Therapy: Knee flexion and extension PROM with overpressure seated, extension mobs grade 3  Gait Training Pt amb 75' X 2 with cane, he prefers to use it in his left hand due to his weakness in Rt hip, he tried it on each side and does appear to have more stability with cane in left hand so I think this is fine for him.  Vaso left knee 10 minutes Medium Pressure 34* (first 3 minutes with heel prop knee extension stretch)      HOME EXERCISE PROGRAM: Access Code: [email or web address] URL: [email or web address] Date: 11/16/2022 Prepared by: Vladimir Faster  Exercises - Ankle Alphabet in Elevation  - 2-4 x daily - 7 x weekly - 3 sets - 1 reps - Quad Setting and Stretching  - 2-4 x daily - 7 x weekly - 5-10 sets - 10 reps - 5 hold - Supine Knee Extension Strengthening  - 1 x daily - 5 x weekly - 1 sets - 10 reps - 5 seconds hold - Supine Heel Slide with Strap  - 2-3 x daily - 7 x weekly - 2-3 sets - 10 reps - 5 seconds hold - Supine Straight Leg Raises  - 2-3 x daily - 7 x weekly - 2-3 sets - 10 reps - 5 seconds hold - Seated Knee Flexion Extension AROM   - 2-4 x daily - 7 x weekly - 2-3 sets - 10 reps - 5 seconds hold - Seated straight leg lifts  - 2-3 x daily - 7 x weekly - 2-3 sets - 10 reps - 5 seconds hold - Seated Hamstring Stretch  with Strap  - 2-4 x daily - 7 x weekly - 1 sets - 3 reps - 20-30 seconds hold - Seated Long Arc Quad  - 1 x daily - 5 x weekly - 1 sets - 10 reps - 5 seconds hold  ASSESSMENT: CLINICAL IMPRESSION: Pt is making progress towards his knee ROM goals, -5 deg extension and 110 deg flexion at end of today's session. Pt was able to progress leg press resistance without issue. Pt had Rt inguinal hernia repaired on 12/09/22, so this was monitored throughout the session. PT discussed HEP and encouraged more consistent completion throughout the week. No pain increase noted end of session. He would continue to benefit from skilled PT to increase knee ROM, strength and mobility.   OBJECTIVE IMPAIRMENTS: Abnormal gait, decreased activity tolerance, decreased balance, decreased endurance, decreased knowledge of condition, decreased knowledge of use of DME, decreased mobility, difficulty walking, decreased ROM,  decreased strength, increased edema, and pain.   ACTIVITY LIMITATIONS: carrying, lifting, bending, sitting, standing, squatting, sleeping, stairs, transfers, and locomotion level  PARTICIPATION LIMITATIONS: meal prep, cleaning, laundry, driving, and community activity  PERSONAL FACTORS: Age, Fitness, Time since onset of injury/illness/exacerbation, and 1-2 comorbidities: see PMH  are also affecting patient's functional outcome.   REHAB POTENTIAL: Good  CLINICAL DECISION MAKING: Stable/uncomplicated  EVALUATION COMPLEXITY: Low   GOALS: Goals reviewed with patient? Yes  SHORT TERM GOALS: (target date for Short term goals 12/17/2022)   1.  Patient will demonstrate independent use of home exercise program to maintain progress from in clinic treatments.  Goal status: MET  2. PROM left knee ext -2* and flexion 100*  Goal status: Partially met -5 to 110 deg  LONG TERM GOALS: (target dates for all long term goals  01/22/2023 )   1. Patient will demonstrate/report pain at worst less than or equal to  2/10 to facilitate minimal limitation in daily activity secondary to pain symptoms.  Goal status: ongoing 11/19/2022   2. Patient will demonstrate independent use of home exercise program to facilitate ability to maintain/progress functional gains from skilled physical therapy services.  Goal status: ongoing 11/19/2022   3. Patient will demonstrate FOTO outcome > or = 44 % to indicate reduced disability due to condition.  Goal status: ongoing 11/17/2022   4.  Patient will demonstrate left LE MMT 5/5 throughout to faciltiate usual transfers, stairs, squatting at [facility] for daily life.   Goal status:  ongoing 11/17/2022   5.  Patient ambulates with cane >300' and negotiates ramps / curbs independently.  Goal status: ongoing 11/17/2022   6.  left Knee PROM 0* to 105* Goal status: ongoing 11/19/2022   7.  left knee AROM ext 0* and flexion 100* Goal Status: ongoing 11/19/2022  PLAN:  PT FREQUENCY:  2-3x/week  PT DURATION: 10 weeks  PLANNED INTERVENTIONS: Therapeutic exercises, Therapeutic activity, Neuro Muscular re-education, Balance training, Gait training, Patient/Family education, Joint mobilization, Stair training, DME instructions, Dry Needling, Electrical stimulation, Traction, Cryotherapy, vasopneumatic deviceMoist heat, Taping, Ultrasound, Ionotophoresis 4mg /ml Dexamethasone, and aquatic therapy, Manual therapy.  All included unless contraindicated  PLAN FOR NEXT SESSION: Continue to progress functional exercises & manual therapy for range /strength. Be aware of post op Rt inguinal hernia repair on 12/09/22.  Vaso for edema to end session.   3:41 PM,12/11/22 Donita Brooks PT, DPT [facility]  [phone]

## 2022-12-11 ENCOUNTER — Ambulatory Visit: Payer: Medicare PPO | Admitting: Physical Therapy

## 2022-12-11 ENCOUNTER — Encounter: Payer: Self-pay | Admitting: Physical Therapy

## 2022-12-11 DIAGNOSIS — M25662 Stiffness of left knee, not elsewhere classified: Secondary | ICD-10-CM

## 2022-12-11 DIAGNOSIS — M6281 Muscle weakness (generalized): Secondary | ICD-10-CM | POA: Diagnosis not present

## 2022-12-11 DIAGNOSIS — R2681 Unsteadiness on feet: Secondary | ICD-10-CM | POA: Diagnosis not present

## 2022-12-11 DIAGNOSIS — R2689 Other abnormalities of gait and mobility: Secondary | ICD-10-CM

## 2022-12-11 DIAGNOSIS — R600 Localized edema: Secondary | ICD-10-CM | POA: Diagnosis not present

## 2022-12-11 DIAGNOSIS — M25562 Pain in left knee: Secondary | ICD-10-CM | POA: Diagnosis not present

## 2022-12-14 ENCOUNTER — Ambulatory Visit (INDEPENDENT_AMBULATORY_CARE_PROVIDER_SITE_OTHER): Payer: Medicare PPO | Admitting: Orthopaedic Surgery

## 2022-12-14 ENCOUNTER — Encounter: Payer: Self-pay | Admitting: Orthopaedic Surgery

## 2022-12-14 DIAGNOSIS — Z96652 Presence of left artificial knee joint: Secondary | ICD-10-CM

## 2022-12-14 NOTE — Progress Notes (Signed)
The patient is now almost 7 weeks status post a left total knee arthroplasty.  He is 89 years old and so the left knee is doing great.  He still having some discomfort and swelling and he is still in physical therapy but is making progress.  On exam he still has some swelling of his left knee to be expected.  His extension is getting close to full and his flexion is past 100 degrees.  It feels stable to me ligamentously.  He did want me to take a look at his hips in terms of range of motion of his hips.  He feels like there is significant weakness in his hips but he has had multiple spine surgeries.  Both of his hips move smoothly and fluidly and x-rays that I can see on our system from his pelvis and right hip from 2022 showed no acute findings and the joint spaces well-maintained.  He does walk with a significant lurch to his gait and there is a Trendelenburg gait and I feel this may be due to muscle atrophy in the gluteus area.  From a knee standpoint I need to see him back in 3 months.  Thus we will have a standing AP and lateral of his left operative knee.  I am happy to look at any type of MRI studies that he has had of his hip in the past if he can get that disc and send it our way.  We may end up ordering a new MRI at some point depending on his symptoms.

## 2022-12-15 ENCOUNTER — Ambulatory Visit: Payer: Medicare PPO | Admitting: Physical Therapy

## 2022-12-15 ENCOUNTER — Encounter: Payer: Self-pay | Admitting: Physical Therapy

## 2022-12-15 DIAGNOSIS — M6281 Muscle weakness (generalized): Secondary | ICD-10-CM | POA: Diagnosis not present

## 2022-12-15 DIAGNOSIS — M25662 Stiffness of left knee, not elsewhere classified: Secondary | ICD-10-CM | POA: Diagnosis not present

## 2022-12-15 DIAGNOSIS — R2689 Other abnormalities of gait and mobility: Secondary | ICD-10-CM | POA: Diagnosis not present

## 2022-12-15 DIAGNOSIS — M25562 Pain in left knee: Secondary | ICD-10-CM | POA: Diagnosis not present

## 2022-12-15 DIAGNOSIS — R600 Localized edema: Secondary | ICD-10-CM | POA: Diagnosis not present

## 2022-12-15 NOTE — Therapy (Signed)
OUTPATIENT PHYSICAL THERAPY LOWER EXTREMITY TREATMENT   Patient Name: Lucas Shepherd MRN: [id number] DOB:09/07/1928, 89 y.o., male Today's Date: 12/15/2022  END OF SESSION:  PT End of Session - 12/15/22 1347     Visit Number 9    Number of Visits 22    Date for PT Re-Evaluation 01/22/23    Authorization Type [employer]    Authorization Time Period $20 COPAY Approved  Authorization #[id number]  Tracking #[id number]  12 PT VISITS 5/6-7/12/24    Authorization - Visit Number 9    Authorization - Number of Visits 12    Progress Note Due on Visit 10    PT Start Time 1347    PT Stop Time 1442    PT Time Calculation (min) 55 min    Activity Tolerance Patient tolerated treatment well;No increased pain    Behavior During Therapy WFL for tasks assessed/performed               Past Medical History:  Diagnosis Date   Anemia    related to past gastric ulcers   Arthritis    Blood transfusion    BPH (benign prostatic hyperplasia)    External hemorrhoids    GERD (gastroesophageal reflux disease)    nisseum fundoplication 2010   GI bleed 06/2011   got 3 units blood   Hiatal hernia    History of kidney stones    History of TMJ syndrome    mild   History of urinary frequency    Osteoporosis    Urinary urgency    Past Surgical History:  Procedure Laterality Date   ABDOMINAL SURGERY  1985   tummy tuc   CATARACT EXTRACTION Bilateral    COLONOSCOPY     ESOPHAGOGASTRODUODENOSCOPY  06/12/2011   Procedure: ESOPHAGOGASTRODUODENOSCOPY (EGD);  Surgeon: Yancey Flemings, MD;  Location: [facility];  Service: Endoscopy;  Laterality: N/A;   HARDWARE REMOVAL  02/23/2012   Procedure: HARDWARE REMOVAL;  Surgeon: Wyn Forster., MD;  Location: [facility];  Service: Orthopedics;  Laterality: Left;  biceps tenolysis and screw removal   HERNIA REPAIR  2013   Hiatal Hernia Repair   INGUINAL HERNIA REPAIR Right 12/09/2022   Procedure: OPEN RIGHT HERNIA REPAIR INGUINAL  ADULT;  Surgeon: Berna Bue, MD;  Location: [facility] OR;  Service: General;  Laterality: Right;   JOINT REPLACEMENT     KNEE ARTHROSCOPY Left 7/13 2017   NISSEN FUNDOPLICATION  2010   ORIF HUMERUS FRACTURE  12/24/2011   Procedure: OPEN REDUCTION INTERNAL FIXATION (ORIF) PROXIMAL HUMERUS FRACTURE;  Surgeon: Wyn Forster., MD;  Location: [facility];  Service: Orthopedics;  Laterality: Left;  ORIF left humerus   SHOULDER ARTHROSCOPY Right 1954   SHOULDER ARTHROSCOPY Left 05/28/2011   x 2   spine fussion     x2   TOTAL KNEE ARTHROPLASTY Left 10/27/2022   Procedure: LEFT TOTAL KNEE ARTHROPLASTY;  Surgeon: Kathryne Hitch, MD;  Location: [facility] OR;  Service: Orthopedics;  Laterality: Left;   TOTAL SHOULDER ARTHROPLASTY Right 2023   x2   Patient Active Problem List   Diagnosis Date Noted   Status post total left knee replacement 10/27/2022   Unilateral primary osteoarthritis, left knee 03/10/2022   Pain in right shoulder 08/21/2021   Pain in right hip 07/03/2020   Low back pain 07/03/2020   Status post laparoscopic fundoplication 07/24/2011   History of peptic ulcer 07/24/2011   Esophageal stricture 06/26/2011   H/O: upper GI bleed 06/26/2011  Edema extremities 06/26/2011   Chest pain 06/26/2011   Anemia due to blood loss, acute 06/26/2011   Syncope and collapse 06/11/2011   Melena 06/11/2011   Anemia 06/11/2011   Azotemia 06/11/2011   Volume depletion 06/11/2011   Leukocytosis 06/11/2011   DYSPHAGIA 08/02/2009   HIATAL HERNIA WITH REFLUX 01/30/2009   VITAMIN B12 DEFICIENCY 01/01/2009   BENIGN PROSTATIC HYPERTROPHY, WITH OBSTRUCTION 12/28/2008   FECAL INCONTINENCE 12/28/2008   GERD 12/27/2008   DIVERTICULOSIS, COLON 12/27/2008   RENAL CALCULUS 12/27/2008    PCP: Farris Has, MD  REFERRING PROVIDER: Doneen Poisson, MD  REFERRING DIAG: [phone] (ICD-10-CM) - Status post total left knee replacement   THERAPY DIAG:  Stiffness of left knee,  not elsewhere classified  Acute pain of left knee  Muscle weakness (generalized)  Other abnormalities of gait and mobility  Localized edema  Rationale for Evaluation and Treatment: Rehabilitation  ONSET DATE: 10/27/2022 Left TKA sg  SUBJECTIVE:   SUBJECTIVE STATEMENT:   He has been doing his exercises.  No falls or near falls. He saw Dr. Magnus Ivan who is going to do MR maybe for hips.  Hernia repair is coming along well.   PERTINENT HISTORY: OA, osteoporosis,  LBP, spine fusion, peptic ulcer, benign prostate hypertrophy with obstruction, diverticulosis, total shoulder arthroplasty X 2 on BUEs, left ORIF humeral fracture,   PAIN:  NPRS scale: 0 pain upon arrival today Pain location: left knee in joint, proximally where tourniquet was located medially, anteriorly, and laterally.  Pain description: ache Aggravating factors: not taking Oxy,  Relieving factors: meds, elevation, ice  PRECAUTIONS: Fall  WEIGHT BEARING RESTRICTIONS: Yes LLE WBAT  FALLS:  Has patient fallen in last 6 months? No  LIVING ENVIRONMENT: Lives with: lives with their spouse Lives in: House Stairs: single step with 2 grab bars to enter.  Home is 2 story but able to live on first floor.  Has following equipment at home: Single point cane, Walker - 2 wheeled, Grab bars, and built-in shower seat  OCCUPATION: retired.  PLOF: Independent  PATIENT GOALS:  golfing, being active in community.  Next MD visit: 12/09/2022  OBJECTIVE:  DIAGNOSTIC FINDINGS: 10/27/22 post surgery X-ray Postsurgical changes of left total knee arthroplasty. No evidence of immediate hardware complication.  PATIENT SURVEYS:  Eval / 11/16/2022: FOTO intake:  21%  predicted:  44%  COGNITION: Overall cognitive status: WFL    SENSATION: WFL  EDEMA:  Eval / 11/16/2022: LLE: above knee 51.5 cm,  around knee 52.7 cm, below knee 42.3 cm RLE: above knee 44.5 cm,  around knee 44 cm, below knee 37.8 cm  POSTURE: Eval / 11/16/2022: flexed  trunk  and weight shift right  PALPATION: Eval / 11/16/2022:  Tenderness left knee around joint line, quadriceps muscle belly, ITB  LOWER EXTREMITY ROM:   ROM Left Eval 11/16/22 Left 11/19/2022 Left 11/30/22  Hip flexion     Hip extension     Hip abduction     Hip adduction     Hip internal rotation     Hip external rotation     Knee flexion Seated A: 81* P: 84* Active assisted (with belt) 97 Supine P: 93* A: 88*  Knee extension Seated  LAQ A: -24* Supine Quad set -11* P: -10* Active -7 Supine P: -9* Seated  LAQ -11*  Ankle dorsiflexion     Ankle plantarflexion     Ankle inversion     Ankle eversion      (Blank rows = not tested) 12/11/22: supine  Lt knee flexion 110 deg, extension -5 deg  LOWER EXTREMITY MMT:  MMT Left Eval 11/16/22  Hip flexion   Hip extension   Hip abduction   Hip adduction   Hip internal rotation   Hip external rotation   Knee flexion 3-/5  Knee extension 3-/5  Ankle dorsiflexion   Ankle plantarflexion   Ankle inversion   Ankle eversion    (Blank rows = not tested)  FUNCTIONAL TESTS:  Eval / 11/16/2022: 18 inch chair transfer: requires use of armrests to arise and RW to stabilize  GAIT: Eval / 11/16/2022: Distance walked: 100'  Assistive device utilized: Environmental consultant - 2 wheeled Level of assistance: SBA Comments: excessive UE weight bearing on RW, left knee flexed in stance with minimal increase for swing. Antalgic with decreased LLE stance duration.    TODAY'S TREATMENT                                                                          DATE: 12/15/2022: Therapeutic Exercise: Recumbent bike able to reach full revolutions after backward warmup 15 sec, seat 9, L1 x 8 min Double leg press: #81 x 15 reps 5 sec hold ext, slow eccentric for knee flexion stretch Single leg press: #43 x15 reps, slow eccentric for knee flexion stretch Hamstring stretch during rest leg press SLR strap with PT assist 30 sec hold 2 reps and LLE long sit with strap 1  rep Gastroc stretch step heel depression 30 sec hold 2 reps Standing heel raises x 10 reps Tandem stance on foam beam 30 sec ea LLE in front & in back with intermittent touch on //bars required.  Standing left knee TKE green theraband 5 sec hold 10 reps 1st set  Seated LAQ LLE 3# & active knee flexion with contralateral LE opposing motion to facilitate stronger muscle contraction 15 reps  Manual: Lt knee extension stretch with overpressure supine and knee flex seated  Vaso left knee 10 minutes Medium Pressure 34* with elevation  12/11/22: Recumbent bike able to reach full revolutions after warmup, seat 9, L1 x5 min Double leg press: #75 x20 reps, slow eccentric for knee flexion stretch Single leg press: #75 x10 reps, slow eccentric for knee flexion stretch Standing heel raises weight shifted Lt x15 reps Manual: Lt knee extension stretch with overpressure 10x5 sec hold, Lt knee flexion slide with overpressure 5x5 sec hold, scar mobilization to decrease adhesions Vaso left knee 10 minutes Medium Pressure 34* with elevation  12/01/2022: Therapeutic Exercise: SciFit bike seat 14 level 3 LEs only 8 min Gastroc stretch on slantboard 30 sec hold 3 reps Heel and toe raises BLEs 15 reps Tandem stance with intermittent touch 30 sec X 2 reps each foot in front Double Leg Press: 75# 16 reps 1 sets slow stretch into flexion and extension Single Leg Press: 37# 16 reps 1 sets slow stretch into flexion and extension Step ups 6 inch step ups- left leg up and RLE down controlled eccentric with bilat UE support 15 reps Standing hamstring stretch with left foot on 6 inch step 20 sec X 3 Supine knee extension heel prop stretch first 3 min of vaso.   Manual Therapy: Knee flexion and extension PROM with overpressure flexion and extension  and extension mobs grade 3-4  Vaso left knee 10 minutes Medium Pressure 34* with elevation    HOME EXERCISE PROGRAM: Access Code: [email or web address] URL:  [email or web address] Date: 11/16/2022 Prepared by: Vladimir Faster  Exercises - Ankle Alphabet in Elevation  - 2-4 x daily - 7 x weekly - 3 sets - 1 reps - Quad Setting and Stretching  - 2-4 x daily - 7 x weekly - 5-10 sets - 10 reps - 5 hold - Supine Knee Extension Strengthening  - 1 x daily - 5 x weekly - 1 sets - 10 reps - 5 seconds hold - Supine Heel Slide with Strap  - 2-3 x daily - 7 x weekly - 2-3 sets - 10 reps - 5 seconds hold - Supine Straight Leg Raises  - 2-3 x daily - 7 x weekly - 2-3 sets - 10 reps - 5 seconds hold - Seated Knee Flexion Extension AROM   - 2-4 x daily - 7 x weekly - 2-3 sets - 10 reps - 5 seconds hold - Seated straight leg lifts  - 2-3 x daily - 7 x weekly - 2-3 sets - 10 reps - 5 seconds hold - Seated Hamstring Stretch with Strap  - 2-4 x daily - 7 x weekly - 1 sets - 3 reps - 20-30 seconds hold - Seated Long Arc Quad  - 1 x daily - 5 x weekly - 1 sets - 10 reps - 5 seconds hold  ASSESSMENT: CLINICAL IMPRESSION: PT progressed functional knee ext exercises which he tolerated well.  Patient continues to slowly improve knee strength.  He has balance issues that appear related to hip strength.   OBJECTIVE IMPAIRMENTS: Abnormal gait, decreased activity tolerance, decreased balance, decreased endurance, decreased knowledge of condition, decreased knowledge of use of DME, decreased mobility, difficulty walking, decreased ROM, decreased strength, increased edema, and pain.   ACTIVITY LIMITATIONS: carrying, lifting, bending, sitting, standing, squatting, sleeping, stairs, transfers, and locomotion level  PARTICIPATION LIMITATIONS: meal prep, cleaning, laundry, driving, and community activity  PERSONAL FACTORS: Age, Fitness, Time since onset of injury/illness/exacerbation, and 1-2 comorbidities: see PMH  are also affecting patient's functional outcome.   REHAB POTENTIAL: Good  CLINICAL DECISION MAKING: Stable/uncomplicated  EVALUATION COMPLEXITY:  Low   GOALS: Goals reviewed with patient? Yes  SHORT TERM GOALS: (target date for Short term goals 12/17/2022)   1.  Patient will demonstrate independent use of home exercise program to maintain progress from in clinic treatments.  Goal status: MET  2. PROM left knee ext -2* and flexion 100*  Goal status: Partially met -5 to 110 deg  LONG TERM GOALS: (target dates for all long term goals  01/22/2023 )   1. Patient will demonstrate/report pain at worst less than or equal to 2/10 to facilitate minimal limitation in daily activity secondary to pain symptoms.  Goal status: ongoing 11/19/2022   2. Patient will demonstrate independent use of home exercise program to facilitate ability to maintain/progress functional gains from skilled physical therapy services.  Goal status: ongoing 11/19/2022   3. Patient will demonstrate FOTO outcome > or = 44 % to indicate reduced disability due to condition.  Goal status: ongoing 11/17/2022   4.  Patient will demonstrate left LE MMT 5/5 throughout to faciltiate usual transfers, stairs, squatting at [facility] for daily life.   Goal status:  ongoing 11/17/2022   5.  Patient ambulates with cane >300' and negotiates ramps / curbs independently.  Goal status: ongoing 11/17/2022   6.  left Knee  PROM 0* to 105* Goal status: ongoing 11/19/2022   7.  left knee AROM ext 0* and flexion 100* Goal Status: ongoing 11/19/2022  PLAN:  PT FREQUENCY:  2-3x/week  PT DURATION: 10 weeks  PLANNED INTERVENTIONS: Therapeutic exercises, Therapeutic activity, Neuro Muscular re-education, Balance training, Gait training, Patient/Family education, Joint mobilization, Stair training, DME instructions, Dry Needling, Electrical stimulation, Traction, Cryotherapy, vasopneumatic deviceMoist heat, Taping, Ultrasound, Ionotophoresis 4mg /ml Dexamethasone, and aquatic therapy, Manual therapy.  All included unless contraindicated  PLAN FOR NEXT SESSION: do 10th visit progress note including  FOTO, measure range & use HH dynameter to assess strength, progress functional exercises & manual therapy for range /strength. Be aware of post op Rt inguinal hernia repair on 12/09/22.  Vaso for edema to end session.    Vladimir Faster, PT, DPT 12/15/2022, 2:38 PM

## 2022-12-16 ENCOUNTER — Encounter: Payer: Self-pay | Admitting: Orthopaedic Surgery

## 2022-12-17 ENCOUNTER — Encounter: Payer: Medicare PPO | Admitting: Rehabilitative and Restorative Service Providers"

## 2022-12-21 ENCOUNTER — Encounter: Payer: Self-pay | Admitting: Physical Therapy

## 2022-12-21 ENCOUNTER — Ambulatory Visit: Payer: Medicare PPO | Admitting: Physical Therapy

## 2022-12-21 DIAGNOSIS — M25662 Stiffness of left knee, not elsewhere classified: Secondary | ICD-10-CM | POA: Diagnosis not present

## 2022-12-21 DIAGNOSIS — R2681 Unsteadiness on feet: Secondary | ICD-10-CM

## 2022-12-21 DIAGNOSIS — R600 Localized edema: Secondary | ICD-10-CM | POA: Diagnosis not present

## 2022-12-21 DIAGNOSIS — M25562 Pain in left knee: Secondary | ICD-10-CM

## 2022-12-21 DIAGNOSIS — M6281 Muscle weakness (generalized): Secondary | ICD-10-CM | POA: Diagnosis not present

## 2022-12-21 DIAGNOSIS — R2689 Other abnormalities of gait and mobility: Secondary | ICD-10-CM

## 2022-12-21 NOTE — Therapy (Signed)
OUTPATIENT PHYSICAL THERAPY LOWER EXTREMITY TREATMENT Progress Note reporting period date 11/16/22 to 12/21/22  See below for objective and subjective measurements relating to patients progress with PT.   Patient Name: Lucas Shepherd MRN: [id number] DOB:August 18, 1928, 89 y.o., male Today's Date: 12/21/2022  END OF SESSION:  PT End of Session - 12/21/22 1146     Visit Number 10    Number of Visits 22    Date for PT Re-Evaluation 01/22/23    Authorization Type [employer]    Authorization Time Period $20 COPAY Approved  Authorization #[id number]  Tracking #[id number]  12 PT VISITS 5/6-7/12/24    Authorization - Visit Number 10    Authorization - Number of Visits 12    Progress Note Due on Visit 20    PT Start Time 1145    PT Stop Time 1238    PT Time Calculation (min) 53 min    Activity Tolerance Patient tolerated treatment well    Behavior During Therapy WFL for tasks assessed/performed               Past Medical History:  Diagnosis Date   Anemia    related to past gastric ulcers   Arthritis    Blood transfusion    BPH (benign prostatic hyperplasia)    External hemorrhoids    GERD (gastroesophageal reflux disease)    nisseum fundoplication 2010   GI bleed 06/2011   got 3 units blood   Hiatal hernia    History of kidney stones    History of TMJ syndrome    mild   History of urinary frequency    Osteoporosis    Urinary urgency    Past Surgical History:  Procedure Laterality Date   ABDOMINAL SURGERY  1985   tummy tuc   CATARACT EXTRACTION Bilateral    COLONOSCOPY     ESOPHAGOGASTRODUODENOSCOPY  06/12/2011   Procedure: ESOPHAGOGASTRODUODENOSCOPY (EGD);  Surgeon: Yancey Flemings, MD;  Location: [facility];  Service: Endoscopy;  Laterality: N/A;   HARDWARE REMOVAL  02/23/2012   Procedure: HARDWARE REMOVAL;  Surgeon: Wyn Forster., MD;  Location: [facility];  Service: Orthopedics;  Laterality: Left;  biceps tenolysis and screw removal    HERNIA REPAIR  2013   Hiatal Hernia Repair   INGUINAL HERNIA REPAIR Right 12/09/2022   Procedure: OPEN RIGHT HERNIA REPAIR INGUINAL ADULT;  Surgeon: Berna Bue, MD;  Location: [facility] OR;  Service: General;  Laterality: Right;   JOINT REPLACEMENT     KNEE ARTHROSCOPY Left 7/13 2017   NISSEN FUNDOPLICATION  2010   ORIF HUMERUS FRACTURE  12/24/2011   Procedure: OPEN REDUCTION INTERNAL FIXATION (ORIF) PROXIMAL HUMERUS FRACTURE;  Surgeon: Wyn Forster., MD;  Location: [facility];  Service: Orthopedics;  Laterality: Left;  ORIF left humerus   SHOULDER ARTHROSCOPY Right 1954   SHOULDER ARTHROSCOPY Left 05/28/2011   x 2   spine fussion     x2   TOTAL KNEE ARTHROPLASTY Left 10/27/2022   Procedure: LEFT TOTAL KNEE ARTHROPLASTY;  Surgeon: Kathryne Hitch, MD;  Location: [facility] OR;  Service: Orthopedics;  Laterality: Left;   TOTAL SHOULDER ARTHROPLASTY Right 2023   x2   Patient Active Problem List   Diagnosis Date Noted   Status post total left knee replacement 10/27/2022   Unilateral primary osteoarthritis, left knee 03/10/2022   Pain in right shoulder 08/21/2021   Pain in right hip 07/03/2020   Low back pain 07/03/2020   Status post laparoscopic fundoplication  07/24/2011   History of peptic ulcer 07/24/2011   Esophageal stricture 06/26/2011   H/O: upper GI bleed 06/26/2011   Edema extremities 06/26/2011   Chest pain 06/26/2011   Anemia due to blood loss, acute 06/26/2011   Syncope and collapse 06/11/2011   Melena 06/11/2011   Anemia 06/11/2011   Azotemia 06/11/2011   Volume depletion 06/11/2011   Leukocytosis 06/11/2011   DYSPHAGIA 08/02/2009   HIATAL HERNIA WITH REFLUX 01/30/2009   VITAMIN B12 DEFICIENCY 01/01/2009   BENIGN PROSTATIC HYPERTROPHY, WITH OBSTRUCTION 12/28/2008   FECAL INCONTINENCE 12/28/2008   GERD 12/27/2008   DIVERTICULOSIS, COLON 12/27/2008   RENAL CALCULUS 12/27/2008    PCP: Farris Has, MD  REFERRING PROVIDER: Doneen Poisson, MD  REFERRING DIAG: [phone] (ICD-10-CM) - Status post total left knee replacement   THERAPY DIAG:  Stiffness of left knee, not elsewhere classified  Acute pain of left knee  Muscle weakness (generalized)  Other abnormalities of gait and mobility  Localized edema  Unsteadiness on feet  Rationale for Evaluation and Treatment: Rehabilitation  ONSET DATE: 10/27/2022 Left TKA sg  SUBJECTIVE:   SUBJECTIVE STATEMENT:   He relays biggest concern is his hip feels so weak and he feels he has a leg length discrepancy with Rt leg shorter PERTINENT HISTORY: OA, osteoporosis,  LBP, spine fusion, peptic ulcer, benign prostate hypertrophy with obstruction, diverticulosis, total shoulder arthroplasty X 2 on BUEs, left ORIF humeral fracture,   PAIN:  NPRS scale: 2-3 in general Pain location: left knee in joint, proximally where tourniquet was located medially, anteriorly, and laterally.  Pain description: ache Aggravating factors: not taking Oxy,  Relieving factors: meds, elevation, ice  PRECAUTIONS: Fall  WEIGHT BEARING RESTRICTIONS: Yes LLE WBAT  FALLS:  Has patient fallen in last 6 months? No  LIVING ENVIRONMENT: Lives with: lives with their spouse Lives in: House Stairs: single step with 2 grab bars to enter.  Home is 2 story but able to live on first floor.  Has following equipment at home: Single point cane, Walker - 2 wheeled, Grab bars, and built-in shower seat  OCCUPATION: retired.  PLOF: Independent  PATIENT GOALS:  golfing, being active in community.  Next MD visit: 12/09/2022  OBJECTIVE:  DIAGNOSTIC FINDINGS: 10/27/22 post surgery X-ray Postsurgical changes of left total knee arthroplasty. No evidence of immediate hardware complication.  PATIENT SURVEYS:  Eval / 11/16/2022: FOTO intake:  21%  predicted:  44% 12/21/22: FOTO improved to 51% and met goal for this.  COGNITION: Overall cognitive status: WFL    SENSATION: WFL  EDEMA:  Eval /  11/16/2022: LLE: above knee 51.5 cm,  around knee 52.7 cm, below knee 42.3 cm RLE: above knee 44.5 cm,  around knee 44 cm, below knee 37.8 cm  POSTURE: Eval / 11/16/2022: flexed trunk  and weight shift right  PALPATION: Eval / 11/16/2022:  Tenderness left knee around joint line, quadriceps muscle belly, ITB  LOWER EXTREMITY ROM:   ROM Left Eval 11/16/22 Left 11/19/2022 Left 11/30/22 Left 12/21/22  Hip flexion      Hip extension      Hip abduction      Hip adduction      Hip internal rotation      Hip external rotation      Knee flexion Seated A: 81* P: 84* Active assisted (with belt) 97 Supine P: 93* A: 88* Supine AAROM 112  Knee extension Seated  LAQ A: -24* Supine Quad set -11* P: -10* Active -7 Supine P: -9* Seated  LAQ -  11* -5 AROM Supine  Ankle dorsiflexion      Ankle plantarflexion      Ankle inversion      Ankle eversion       (Blank rows = not tested) 12/11/22: supine Lt knee flexion 110 deg, extension -5 deg  LOWER EXTREMITY MMT:  MMT Left Eval 11/16/22 Left/Right 12/21/22   Hip flexion  4+/4  Hip extension    Hip abduction  4+/4  Hip adduction    Hip internal rotation    Hip external rotation    Knee flexion 3-/5 5/5  Knee extension 3-/5 5/5  Ankle dorsiflexion    Ankle plantarflexion    Ankle inversion    Ankle eversion     (Blank rows = not tested)  FUNCTIONAL TESTS:  Eval / 11/16/2022: 18 inch chair transfer: requires use of armrests to arise and RW to stabilize  12/21/22: Leg length measured at 41.75 inch on left leg and 41 inch on Rt leg  GAIT: Eval / 11/16/2022: Distance walked: 100'  Assistive device utilized: Environmental consultant - 2 wheeled Level of assistance: SBA Comments: excessive UE weight bearing on RW, left knee flexed in stance with minimal increase for swing. Antalgic with decreased LLE stance duration.    TODAY'S TREATMENT                                                                          DATE: 12/21/2022: Therapeutic  Exercise: Recumbent bike full revolutions  seat 9, L3 x 8 min Updated FOTO, measurements and goals Gait around clinic with SPC and trial of heel lift in Rt shoe Forward Step ups with UE support 6 inch step X 10 bilat Lateral step ups with UE support 6 inch step X 10 bilat Double leg press: #81 2x10 reps 5 sec hold ext, slow eccentric for knee flexion stretch Single leg press: #43 x15 reps, slow eccentric for knee flexion stretch   Manual: Lt knee PROM flexion and extension with overpressure supine and extension mobs grade 2-3. Left manual hamstring stretching 20 sec X 4 Vaso left knee 10 minutes Medium Pressure 34* with elevation  12/15/2022: Therapeutic Exercise: Recumbent bike able to reach full revolutions after backward warmup 15 sec, seat 9, L1 x 8 min Double leg press: #81 x 15 reps 5 sec hold ext, slow eccentric for knee flexion stretch Single leg press: #43 x15 reps, slow eccentric for knee flexion stretch Hamstring stretch during rest leg press SLR strap with PT assist 30 sec hold 2 reps and LLE long sit with strap 1 rep Gastroc stretch step heel depression 30 sec hold 2 reps Standing heel raises x 10 reps Tandem stance on foam beam 30 sec ea LLE in front & in back with intermittent touch on //bars required.  Standing left knee TKE green theraband 5 sec hold 10 reps 1st set  Seated LAQ LLE 3# & active knee flexion with contralateral LE opposing motion to facilitate stronger muscle contraction 15 reps  Manual: Lt knee extension stretch with overpressure supine and knee flex seated  Vaso left knee 10 minutes Medium Pressure 34* with elevation  12/11/22: Recumbent bike able to reach full revolutions after warmup, seat 9, L1 x5 min Double leg press: #75  x20 reps, slow eccentric for knee flexion stretch Single leg press: #75 x10 reps, slow eccentric for knee flexion stretch Standing heel raises weight shifted Lt x15 reps Manual: Lt knee extension stretch with overpressure 10x5 sec  hold, Lt knee flexion slide with overpressure 5x5 sec hold, scar mobilization to decrease adhesions Vaso left knee 10 minutes Medium Pressure 34* with elevation  12/01/2022: Therapeutic Exercise: SciFit bike seat 14 level 3 LEs only 8 min Gastroc stretch on slantboard 30 sec hold 3 reps Heel and toe raises BLEs 15 reps Tandem stance with intermittent touch 30 sec X 2 reps each foot in front Double Leg Press: 75# 16 reps 1 sets slow stretch into flexion and extension Single Leg Press: 37# 16 reps 1 sets slow stretch into flexion and extension Step ups 6 inch step ups- left leg up and RLE down controlled eccentric with bilat UE support 15 reps Standing hamstring stretch with left foot on 6 inch step 20 sec X 3 Supine knee extension heel prop stretch first 3 min of vaso.   Manual Therapy: Knee flexion and extension PROM with overpressure flexion and extension and extension mobs grade 3-4  Vaso left knee 10 minutes Medium Pressure 34* with elevation    HOME EXERCISE PROGRAM: Access Code: [email or web address] URL: [email or web address] Date: 11/16/2022 Prepared by: Vladimir Faster  Exercises - Ankle Alphabet in Elevation  - 2-4 x daily - 7 x weekly - 3 sets - 1 reps - Quad Setting and Stretching  - 2-4 x daily - 7 x weekly - 5-10 sets - 10 reps - 5 hold - Supine Knee Extension Strengthening  - 1 x daily - 5 x weekly - 1 sets - 10 reps - 5 seconds hold - Supine Heel Slide with Strap  - 2-3 x daily - 7 x weekly - 2-3 sets - 10 reps - 5 seconds hold - Supine Straight Leg Raises  - 2-3 x daily - 7 x weekly - 2-3 sets - 10 reps - 5 seconds hold - Seated Knee Flexion Extension AROM   - 2-4 x daily - 7 x weekly - 2-3 sets - 10 reps - 5 seconds hold - Seated straight leg lifts  - 2-3 x daily - 7 x weekly - 2-3 sets - 10 reps - 5 seconds hold - Seated Hamstring Stretch with Strap  - 2-4 x daily - 7 x weekly - 1 sets - 3 reps - 20-30 seconds hold - Seated Long Arc Quad  - 1 x daily - 5 x weekly  - 1 sets - 10 reps - 5 seconds hold  ASSESSMENT: CLINICAL IMPRESSION:10th visit progress note shows he has made good progress with his left knee strength, ROM, and FOTO functional survey. He has met FOTO goal now but does still have some PT goals that are unmet. He is more limited due to Rt hip weakness more than anything and will continue to benefit from functional strength training in PT along with continued work to improve his left knee ROM. He was noted to have a 3/4 inch leg length discrepancy with Rt leg being shorter so I did give him 2 options for heel lifts to wear in his Rt shoe to try.    OBJECTIVE IMPAIRMENTS: Abnormal gait, decreased activity tolerance, decreased balance, decreased endurance, decreased knowledge of condition, decreased knowledge of use of DME, decreased mobility, difficulty walking, decreased ROM, decreased strength, increased edema, and pain.   ACTIVITY LIMITATIONS: carrying, lifting, bending, sitting, standing, squatting, sleeping, stairs, transfers,  and locomotion level  PARTICIPATION LIMITATIONS: meal prep, cleaning, laundry, driving, and community activity  PERSONAL FACTORS: Age, Fitness, Time since onset of injury/illness/exacerbation, and 1-2 comorbidities: see PMH  are also affecting patient's functional outcome.   REHAB POTENTIAL: Good  CLINICAL DECISION MAKING: Stable/uncomplicated  EVALUATION COMPLEXITY: Low   GOALS: Goals reviewed with patient? Yes  SHORT TERM GOALS: (target date for Short term goals 12/17/2022)   1.  Patient will demonstrate independent use of home exercise program to maintain progress from in clinic treatments.  Goal status: MET  2. PROM left knee ext -2* and flexion 100*  Goal status: Partially met 12/21/22 -5 to 112 deg  LONG TERM GOALS: (target dates for all long term goals  01/22/2023 )   1. Patient will demonstrate/report pain at worst less than or equal to 2/10 to facilitate minimal limitation in daily activity secondary  to pain symptoms.  Goal status: ongoing 12/21/2022   2. Patient will demonstrate independent use of home exercise program to facilitate ability to maintain/progress functional gains from skilled physical therapy services.  Goal status: ongoing 12/21/2022   3. Patient will demonstrate FOTO outcome > or = 44 % to indicate reduced disability due to condition.  Goal status: MET 12/21/22   4.  Patient will demonstrate left LE MMT 5/5 throughout to faciltiate usual transfers, stairs, squatting at [facility] for daily life.   Goal status:  MET for knee but still with some hip weakness Rt more than Lt 12/21/22   5.  Patient ambulates with cane >300' and negotiates ramps / curbs independently.  Goal status: ongoing 12/21/2022   6.  left Knee PROM 0* to 105* Goal status: ongoing 12/21/22   7.  left knee AROM ext 0* and flexion 100* Goal Status: ongoing 12/21/22  PLAN:  PT FREQUENCY:  2-3x/week  PT DURATION: 10 weeks  PLANNED INTERVENTIONS: Therapeutic exercises, Therapeutic activity, Neuro Muscular re-education, Balance training, Gait training, Patient/Family education, Joint mobilization, Stair training, DME instructions, Dry Needling, Electrical stimulation, Traction, Cryotherapy, vasopneumatic deviceMoist heat, Taping, Ultrasound, Ionotophoresis 4mg /ml Dexamethasone, and aquatic therapy, Manual therapy.  All included unless contraindicated  PLAN FOR NEXT SESSION: how has the heel lift? progress functional exercises & manual therapy for range /strength of left knee, and hip strength as well. Be aware of post op Rt inguinal hernia repair on 12/09/22.  Vaso for edema to end session.    April Manson, PT, DPT 12/21/2022, 12:35 PM

## 2022-12-22 ENCOUNTER — Encounter: Payer: Self-pay | Admitting: Orthopaedic Surgery

## 2022-12-22 ENCOUNTER — Other Ambulatory Visit: Payer: Self-pay | Admitting: Orthopaedic Surgery

## 2022-12-22 MED ORDER — TRAMADOL HCL 50 MG PO TABS: 50.0000 mg | ORAL_TABLET | Freq: Three times a day (TID) | ORAL | 0 refills | Status: DC | PRN

## 2022-12-23 ENCOUNTER — Encounter: Payer: Self-pay | Admitting: Physical Therapy

## 2022-12-23 ENCOUNTER — Ambulatory Visit: Payer: Medicare PPO | Admitting: Physical Therapy

## 2022-12-23 DIAGNOSIS — R2681 Unsteadiness on feet: Secondary | ICD-10-CM | POA: Diagnosis not present

## 2022-12-23 DIAGNOSIS — R2689 Other abnormalities of gait and mobility: Secondary | ICD-10-CM

## 2022-12-23 DIAGNOSIS — M25562 Pain in left knee: Secondary | ICD-10-CM | POA: Diagnosis not present

## 2022-12-23 DIAGNOSIS — R600 Localized edema: Secondary | ICD-10-CM | POA: Diagnosis not present

## 2022-12-23 DIAGNOSIS — M25662 Stiffness of left knee, not elsewhere classified: Secondary | ICD-10-CM

## 2022-12-23 DIAGNOSIS — M6281 Muscle weakness (generalized): Secondary | ICD-10-CM | POA: Diagnosis not present

## 2022-12-23 NOTE — Therapy (Addendum)
PHYSICAL THERAPY DISCHARGE SUMMARY  Visits from Start of Care: 11  Current functional level related to goals / functional outcomes: Patient had surgery and is discharged from PT.    Remaining deficits: See below for last known level.    Education / Equipment: HEP   Patient agrees to discharge. Patient goals were not met. Patient is being discharged due to a change in medical status.   Vladimir Faster, PT, DPT 02/01/2023, 9:13 AM   OUTPATIENT PHYSICAL THERAPY LOWER EXTREMITY TREATMENT  Patient Name: Lucas Shepherd MRN: [id number] DOB:[birth year], 89 y.o., male Today's Date: 12/23/2022  END OF SESSION:  PT End of Session - 12/23/22 1057     Visit Number 11    Number of Visits 22    Date for PT Re-Evaluation 01/22/23    Authorization Type [employer]    Authorization Time Period $20 COPAY Approved  Authorization #[id number]  Tracking #[id number]  12 PT VISITS 5/6-7/12/24    Authorization - Visit Number 11    Authorization - Number of Visits 12    Progress Note Due on Visit 20    PT Start Time 1057    PT Stop Time 1150    PT Time Calculation (min) 53 min    Activity Tolerance Patient tolerated treatment well    Behavior During Therapy WFL for tasks assessed/performed                Past Medical History:  Diagnosis Date   Anemia    related to past gastric ulcers   Arthritis    Blood transfusion    BPH (benign prostatic hyperplasia)    External hemorrhoids    GERD (gastroesophageal reflux disease)    nisseum fundoplication 2010   GI bleed 06/2011   got 3 units blood   Hiatal hernia    History of kidney stones    History of TMJ syndrome    mild   History of urinary frequency    Osteoporosis    Urinary urgency    Past Surgical History:  Procedure Laterality Date   ABDOMINAL SURGERY  1985   tummy tuc   CATARACT EXTRACTION Bilateral    COLONOSCOPY     ESOPHAGOGASTRODUODENOSCOPY  06/12/2011   Procedure: ESOPHAGOGASTRODUODENOSCOPY (EGD);  Surgeon:  Yancey Flemings, MD;  Location: [facility];  Service: Endoscopy;  Laterality: N/A;   HARDWARE REMOVAL  02/23/2012   Procedure: HARDWARE REMOVAL;  Surgeon: Wyn Forster., MD;  Location: [facility];  Service: Orthopedics;  Laterality: Left;  biceps tenolysis and screw removal   HERNIA REPAIR  2013   Hiatal Hernia Repair   INGUINAL HERNIA REPAIR Right 12/09/2022   Procedure: OPEN RIGHT HERNIA REPAIR INGUINAL ADULT;  Surgeon: Berna Bue, MD;  Location: [facility];  Service: General;  Laterality: Right;   JOINT REPLACEMENT     KNEE ARTHROSCOPY Left 7/13 2017   NISSEN FUNDOPLICATION  2010   ORIF HUMERUS FRACTURE  12/24/2011   Procedure: OPEN REDUCTION INTERNAL FIXATION (ORIF) PROXIMAL HUMERUS FRACTURE;  Surgeon: Wyn Forster., MD;  Location: [facility];  Service: Orthopedics;  Laterality: Left;  ORIF left humerus   SHOULDER ARTHROSCOPY Right 1954   SHOULDER ARTHROSCOPY Left 05/28/2011   x 2   spine fussion     x2   TOTAL KNEE ARTHROPLASTY Left 10/27/2022   Procedure: LEFT TOTAL KNEE ARTHROPLASTY;  Surgeon: Kathryne Hitch, MD;  Location: [facility];  Service: Orthopedics;  Laterality: Left;   TOTAL SHOULDER ARTHROPLASTY Right  2023   x2   Patient Active Problem List   Diagnosis Date Noted   Status post total left knee replacement 10/27/2022   Unilateral primary osteoarthritis, left knee 03/10/2022   Pain in right shoulder 08/21/2021   Pain in right hip 07/03/2020   Low back pain 07/03/2020   Status post laparoscopic fundoplication 07/24/2011   History of peptic ulcer 07/24/2011   Esophageal stricture 06/26/2011   H/O: upper GI bleed 06/26/2011   Edema extremities 06/26/2011   Chest pain 06/26/2011   Anemia due to blood loss, acute 06/26/2011   Syncope and collapse 06/11/2011   Melena 06/11/2011   Anemia 06/11/2011   Azotemia 06/11/2011   Volume depletion 06/11/2011   Leukocytosis 06/11/2011   DYSPHAGIA 08/02/2009   HIATAL HERNIA WITH  REFLUX 01/30/2009   VITAMIN B12 DEFICIENCY 01/01/2009   BENIGN PROSTATIC HYPERTROPHY, WITH OBSTRUCTION 12/28/2008   FECAL INCONTINENCE 12/28/2008   GERD 12/27/2008   DIVERTICULOSIS, COLON 12/27/2008   RENAL CALCULUS 12/27/2008    PCP: Farris Has, MD  REFERRING PROVIDER: Doneen Poisson, MD  REFERRING DIAG: [phone] (ICD-10-CM) - Status post total left knee replacement   THERAPY DIAG:  Stiffness of left knee, not elsewhere classified  Acute pain of left knee  Muscle weakness (generalized)  Other abnormalities of gait and mobility  Localized edema  Unsteadiness on feet  Rationale for Evaluation and Treatment: Rehabilitation  ONSET DATE: 10/27/2022 Left TKA sg  SUBJECTIVE:   SUBJECTIVE STATEMENT:  He has been using 1/4" heel lift in right shoe. He wanted to know how measurements on back effect standing.    PERTINENT HISTORY: OA, osteoporosis,  LBP, spine fusion, peptic ulcer, benign prostate hypertrophy with obstruction, diverticulosis, total shoulder arthroplasty X 2 on BUEs, left ORIF humeral fracture,   PAIN:  NPRS scale: 3/10 and ranging 2/10 - 5/10 Pain location: left knee in joint, proximally where tourniquet was located medially, anteriorly, and laterally.  Pain description: ache Aggravating factors: not taking Oxy,  tramadol at bedtime Relieving factors: meds, elevation, ice  PRECAUTIONS: Fall  WEIGHT BEARING RESTRICTIONS: Yes LLE WBAT  FALLS:  Has patient fallen in last 6 months? No  LIVING ENVIRONMENT: Lives with: lives with their spouse Lives in: House Stairs: single step with 2 grab bars to enter.  Home is 2 story but able to live on first floor.  Has following equipment at home: Single point cane, Walker - 2 wheeled, Grab bars, and built-in shower seat  OCCUPATION: retired.  PLOF: Independent  PATIENT GOALS:  golfing, being active in community.  Next MD visit: 12/09/2022  OBJECTIVE:  DIAGNOSTIC FINDINGS: 10/27/22 post surgery X-ray  Postsurgical changes of left total knee arthroplasty. No evidence of immediate hardware complication.  PATIENT SURVEYS:  Eval / 11/16/2022: FOTO intake:  21%  predicted:  44% 12/21/22: FOTO improved to 51% and met goal for this.  COGNITION: Overall cognitive status: WFL    SENSATION: WFL  EDEMA:  Eval / 11/16/2022: LLE: above knee 51.5 cm,  around knee 52.7 cm, below knee 42.3 cm RLE: above knee 44.5 cm,  around knee 44 cm, below knee 37.8 cm  POSTURE: Eval / 11/16/2022: flexed trunk  and weight shift right  PALPATION: Eval / 11/16/2022:  Tenderness left knee around joint line, quadriceps muscle belly, ITB  LOWER EXTREMITY ROM:   ROM Left Eval 11/16/22 Left 11/19/2022 Left 11/30/22 Left 12/21/22  Hip flexion      Hip extension      Hip abduction      Hip adduction  Hip internal rotation      Hip external rotation      Knee flexion Seated A: 81* P: 84* Active assisted (with belt) 97 Supine P: 93* A: 88* Supine AAROM 112  Knee extension Seated  LAQ A: -24* Supine Quad set -11* P: -10* Active -7 Supine P: -9* Seated  LAQ -11* -5 AROM Supine  Ankle dorsiflexion      Ankle plantarflexion      Ankle inversion      Ankle eversion       (Blank rows = not tested) 12/11/22: supine Lt knee flexion 110 deg, extension -5 deg  LOWER EXTREMITY MMT:  MMT Left Eval 11/16/22 Left/Right 12/21/22   Hip flexion  4+/4  Hip extension    Hip abduction  4+/4  Hip adduction    Hip internal rotation    Hip external rotation    Knee flexion 3-/5 5/5  Knee extension 3-/5 5/5  Ankle dorsiflexion    Ankle plantarflexion    Ankle inversion    Ankle eversion     (Blank rows = not tested)  FUNCTIONAL TESTS:  Eval / 11/16/2022: 18 inch chair transfer: requires use of armrests to arise and RW to stabilize  12/21/22: Leg length measured at 41.75 inch on left leg and 41 inch on Rt leg  GAIT: Eval / 11/16/2022: Distance walked: 100'  Assistive device utilized: Environmental consultant - 2 wheeled Level of  assistance: SBA Comments: excessive UE weight bearing on RW, left knee flexed in stance with minimal increase for swing. Antalgic with decreased LLE stance duration.    TODAY'S TREATMENT                                                                          DATE: 12/23/2022: Therapeutic Exercise: Knee ext machine BLEs 20# 10 reps; concentric BLEs & isometric 2 sec / eccentric 10# 10 reps 2 sets Knee flex machine BLEs 35# 10 reps; LLE only 15# 10 reps 2 sets Standing left knee TKE green theraband 5 sec hold 15 reps 1st set and tapping cone with RLE 15 reps 2nd set Tandem stance on foam beam LLE in front & in back 30 sec ea with intermittent touch Side stepping on foam beam including transition on/off floor 2 reps with intermittent touch Forward Step ups with UE support 6 inch step X 15 bilat Gastroc stretch on incline board straddle position 30 sec hold 2 reps Heel raises on incline board 10 reps Double leg press: #87 2x15 reps 5 sec hold ext, slow eccentric for knee flexion stretch Single leg press: #43 x15 reps, slow eccentric for knee flexion stretch PT assessed leg length height functionally in standing. With 1/4" lift in right shoe & left knee ext to current max, his pelvis was level. PT demo how to assess in mirror & pt verbalized understanding. As his left knee straightens, he may not need as much ht to lift.  PT showed pt Adjust-a-lift on internet as option. Pt verbalized understanding.    Manual:  Mob belt for ext with pt standing on incline board.  Left manual hamstring stretching 20 sec X 4. Passive hamstring stretch during rests on leg press   12/21/2022: Therapeutic Exercise: Recumbent bike full  revolutions  seat 9, L3 x 8 min Updated FOTO, measurements and goals Gait around clinic with SPC and trial of heel lift in Rt shoe Forward Step ups with UE support 6 inch step X 10 bilat Lateral step ups with UE support 6 inch step X 10 bilat Double leg press: #81 2x10 reps 5 sec  hold ext, slow eccentric for knee flexion stretch Single leg press: #43 x15 reps, slow eccentric for knee flexion stretch   Manual: Lt knee PROM flexion and extension with overpressure supine and extension mobs grade 2-3. Left manual hamstring stretching 20 sec X 4 Vaso left knee 10 minutes Medium Pressure 34* with elevation  12/15/2022: Therapeutic Exercise: Recumbent bike able to reach full revolutions after backward warmup 15 sec, seat 9, L1 x 8 min Double leg press: #81 x 15 reps 5 sec hold ext, slow eccentric for knee flexion stretch Single leg press: #43 x15 reps, slow eccentric for knee flexion stretch Hamstring stretch during rest leg press SLR strap with PT assist 30 sec hold 2 reps and LLE long sit with strap 1 rep Gastroc stretch step heel depression 30 sec hold 2 reps Standing heel raises x 10 reps Tandem stance on foam beam 30 sec ea LLE in front & in back with intermittent touch on //bars required.  Standing left knee TKE green theraband 5 sec hold 10 reps 1st set and tapping cone with RLE 10 reps 2nd set Seated LAQ LLE 3# & active knee flexion with contralateral LE opposing motion to facilitate stronger muscle contraction 15 reps  Manual: Lt knee extension stretch with overpressure supine and knee flex seated  Vaso left knee 10 minutes Medium Pressure 34* with elevation     HOME EXERCISE PROGRAM: Access Code: [email or web address] URL: [email or web address] Date: 11/16/2022 Prepared by: Vladimir Faster  Exercises - Ankle Alphabet in Elevation  - 2-4 x daily - 7 x weekly - 3 sets - 1 reps - Quad Setting and Stretching  - 2-4 x daily - 7 x weekly - 5-10 sets - 10 reps - 5 hold - Supine Knee Extension Strengthening  - 1 x daily - 5 x weekly - 1 sets - 10 reps - 5 seconds hold - Supine Heel Slide with Strap  - 2-3 x daily - 7 x weekly - 2-3 sets - 10 reps - 5 seconds hold - Supine Straight Leg Raises  - 2-3 x daily - 7 x weekly - 2-3 sets - 10 reps - 5 seconds hold -  Seated Knee Flexion Extension AROM   - 2-4 x daily - 7 x weekly - 2-3 sets - 10 reps - 5 seconds hold - Seated straight leg lifts  - 2-3 x daily - 7 x weekly - 2-3 sets - 10 reps - 5 seconds hold - Seated Hamstring Stretch with Strap  - 2-4 x daily - 7 x weekly - 1 sets - 3 reps - 20-30 seconds hold - Seated Long Arc Quad  - 1 x daily - 5 x weekly - 1 sets - 10 reps - 5 seconds hold  ASSESSMENT: CLINICAL IMPRESSION:  Patient is slowly improving functional strength with PT exercises.  Using a right heel lift seems to have reduced his hip discomfort.  Pt continues to benefit from skilled PT.   OBJECTIVE IMPAIRMENTS: Abnormal gait, decreased activity tolerance, decreased balance, decreased endurance, decreased knowledge of condition, decreased knowledge of use of DME, decreased mobility, difficulty walking, decreased ROM, decreased strength, increased edema, and pain.  ACTIVITY LIMITATIONS: carrying, lifting, bending, sitting, standing, squatting, sleeping, stairs, transfers, and locomotion level  PARTICIPATION LIMITATIONS: meal prep, cleaning, laundry, driving, and community activity  PERSONAL FACTORS: Age, Fitness, Time since onset of injury/illness/exacerbation, and 1-2 comorbidities: see PMH  are also affecting patient's functional outcome.   REHAB POTENTIAL: Good  CLINICAL DECISION MAKING: Stable/uncomplicated  EVALUATION COMPLEXITY: Low   GOALS: Goals reviewed with patient? Yes  SHORT TERM GOALS: (target date for Short term goals 12/17/2022)   1.  Patient will demonstrate independent use of home exercise program to maintain progress from in clinic treatments.  Goal status: MET  2. PROM left knee ext -2* and flexion 100*  Goal status: Partially met 12/21/22 -5 to 112 deg  LONG TERM GOALS: (target dates for all long term goals  01/22/2023 )   1. Patient will demonstrate/report pain at worst less than or equal to 2/10 to facilitate minimal limitation in daily activity secondary to  pain symptoms.  Goal status: ongoing 12/21/2022   2. Patient will demonstrate independent use of home exercise program to facilitate ability to maintain/progress functional gains from skilled physical therapy services.  Goal status: ongoing 12/21/2022   3. Patient will demonstrate FOTO outcome > or = 44 % to indicate reduced disability due to condition.  Goal status: MET 12/21/22   4.  Patient will demonstrate left LE MMT 5/5 throughout to faciltiate usual transfers, stairs, squatting at [facility] for daily life.   Goal status:  MET for knee but still with some hip weakness Rt more than Lt 12/21/22   5.  Patient ambulates with cane >300' and negotiates ramps / curbs independently.  Goal status: ongoing 12/21/2022   6.  left Knee PROM 0* to 105* Goal status: ongoing 12/21/22   7.  left knee AROM ext 0* and flexion 100* Goal Status: ongoing 12/21/22  PLAN:  PT FREQUENCY:  2-3x/week  PT DURATION: 10 weeks  PLANNED INTERVENTIONS: Therapeutic exercises, Therapeutic activity, Neuro Muscular re-education, Balance training, Gait training, Patient/Family education, Joint mobilization, Stair training, DME instructions, Dry Needling, Electrical stimulation, Traction, Cryotherapy, vasopneumatic deviceMoist heat, Taping, Ultrasound, Ionotophoresis 4mg /ml Dexamethasone, and aquatic therapy, Manual therapy.  All included unless contraindicated  PLAN FOR NEXT SESSION: do [employer] recert. Check strength with HH dynameter.  progress functional exercises & manual therapy for range /strength of left knee, and hip strength as well. Be aware of post op Rt inguinal hernia repair on 12/09/22.  Vaso for edema to end session.    Vladimir Faster, PT, DPT 12/23/2022, 11:55 AM

## 2022-12-28 ENCOUNTER — Encounter: Payer: Medicare PPO | Admitting: Physical Therapy

## 2022-12-28 DIAGNOSIS — M810 Age-related osteoporosis without current pathological fracture: Secondary | ICD-10-CM | POA: Diagnosis not present

## 2022-12-30 ENCOUNTER — Encounter: Payer: Medicare PPO | Admitting: Physical Therapy

## 2023-01-01 ENCOUNTER — Encounter: Payer: Self-pay | Admitting: Physician Assistant

## 2023-01-01 ENCOUNTER — Other Ambulatory Visit (INDEPENDENT_AMBULATORY_CARE_PROVIDER_SITE_OTHER): Payer: Medicare PPO

## 2023-01-01 ENCOUNTER — Ambulatory Visit: Payer: Medicare PPO | Admitting: Physician Assistant

## 2023-01-01 DIAGNOSIS — Z96652 Presence of left artificial knee joint: Secondary | ICD-10-CM

## 2023-01-01 NOTE — Progress Notes (Signed)
Office Visit Note   Patient: Lucas Shepherd           Date of Birth: Nov 03, 1928           MRN: [id number] Visit Date: 01/01/2023              Requested by: Farris Has, MD [address] [place],  [place] [address] PCP: Farris Has, MD  Chief Complaint  Patient presents with   Left Knee - Pain      HPI: Lucas Shepherd is a pleasant 89 year old gentleman who is a little over 2 months status post left total knee arthroplasty with Dr. Magnus Ivan he has been doing fairly well until 2 days ago he.  He said he was walking around the bed and turning when he had immediate pain that caused him to fall onto the bed to his left lateral knee down to the his anterior tibia.  Denies any calf pain.  He admits 2 days later its gotten much better.  Denies any weakness loss of bowel or bladder control.  Pain is mild now but described it as severe a couple days ago  Assessment & Plan: Visit Diagnoses:  1. Status post total left knee replacement     Plan: I am reassured that he is gotten significantly better than he was 2 days ago.  He did say he was walking around the corner of the bed.  Certainly could have torn through some scar tissue adhesion.  His compartments are soft and nontender he has a negative Denna Haggard' sign there is no evidence of any infective process or effusion.  I told him to watch it over the weekend certainly I would hope this will continue to resolve on its own.  I tried alternate heat and ice as he cannot use topical Voltaren.  Obviously if this progresses he should call us immediately  Follow-Up Instructions: No follow-ups on file.   Ortho Exam  Patient is alert, oriented, no adenopathy, well-dressed, normal affect, normal respiratory effort. Examination of his left knee no effusion no redness no cellulitis he has good range of motion without pain he is got good strength with resisted extension and flexion of his legs.  He is neurovascularly intact.  Compartments of the  calf are soft and nontender he has negative Homans' sign  Imaging: XR Knee 1-2 Views Left  Result Date: 01/01/2023 Radiograph of his left knees was taken today do not appreciate any evidence of any acute injury to the proximal tibia knee components are in place from recent knee replacement.  No changes since previous x-rays  No images are attached to the encounter.  Labs: Lab Results  Component Value Date   ESRSEDRATE 10 12/28/2008   REPTSTATUS 09/21/2020 FINAL 09/19/2020   CULT (A) 09/19/2020    <10,000 COLONIES/mL INSIGNIFICANT GROWTH Performed at [facility], [address] [place] [address], [place], [place] [address]    Leanor Kail PNEUMONIAE 04/15/2015     Lab Results  Component Value Date   ALBUMIN 4.0 10/20/2022   ALBUMIN 4.1 04/15/2015   ALBUMIN 4.0 07/24/2014    Lab Results  Component Value Date   MG 2.1 06/14/2011   No results found for: "VD25OH"  No results found for: "PREALBUMIN"    Latest Ref Rng & Units 12/01/2022   12:00 PM 10/28/2022    4:41 AM 10/20/2022   11:25 AM  CBC EXTENDED  WBC 4.0 - 10.5 K/uL 7.7  7.9  7.2   RBC 4.22 - 5.81  MIL/uL 3.61  3.48  4.19   Hemoglobin 13.0 - 17.0 g/dL 16.1  09.6  04.5   HCT 39.0 - 52.0 % 34.9  33.4  40.1   Platelets 150 - 400 K/uL 263  172  195      There is no height or weight on file to calculate BMI.  Orders:  Orders Placed This Encounter  Procedures   XR Knee 1-2 Views Left   No orders of the defined types were placed in this encounter.    Procedures: No procedures performed  Clinical Data: No additional findings.  ROS:  All other systems negative, except as noted in the HPI. Review of Systems  Objective: Vital Signs: There were no vitals taken for this visit.  Specialty Comments:  No specialty comments available.  PMFS History: Patient Active Problem List   Diagnosis Date Noted   Status post total left knee replacement 10/27/2022   Unilateral primary osteoarthritis, left knee 03/10/2022    Pain in right shoulder 08/21/2021   Pain in right hip 07/03/2020   Low back pain 07/03/2020   Status post laparoscopic fundoplication 07/24/2011   History of peptic ulcer 07/24/2011   Esophageal stricture 06/26/2011   H/O: upper GI bleed 06/26/2011   Edema extremities 06/26/2011   Chest pain 06/26/2011   Anemia due to blood loss, acute 06/26/2011   Syncope and collapse 06/11/2011   Melena 06/11/2011   Anemia 06/11/2011   Azotemia 06/11/2011   Volume depletion 06/11/2011   Leukocytosis 06/11/2011   DYSPHAGIA 08/02/2009   HIATAL HERNIA WITH REFLUX 01/30/2009   VITAMIN B12 DEFICIENCY 01/01/2009   BENIGN PROSTATIC HYPERTROPHY, WITH OBSTRUCTION 12/28/2008   FECAL INCONTINENCE 12/28/2008   GERD 12/27/2008   DIVERTICULOSIS, COLON 12/27/2008   RENAL CALCULUS 12/27/2008   Past Medical History:  Diagnosis Date   Anemia    related to past gastric ulcers   Arthritis    Blood transfusion    BPH (benign prostatic hyperplasia)    External hemorrhoids    GERD (gastroesophageal reflux disease)    nisseum fundoplication 2010   GI bleed 06/2011   got 3 units blood   Hiatal hernia    History of kidney stones    History of TMJ syndrome    mild   History of urinary frequency    Osteoporosis    Urinary urgency     Family History  Problem Relation Age of Onset   Heart defect Father        aortic valve   Nephrolithiasis Mother    Lung cancer Brother        smoker   Colon cancer Neg Hx    Esophageal cancer Neg Hx    Stomach cancer Neg Hx    Rectal cancer Neg Hx     Past Surgical History:  Procedure Laterality Date   ABDOMINAL SURGERY  1985   tummy tuc   CATARACT EXTRACTION Bilateral    COLONOSCOPY     ESOPHAGOGASTRODUODENOSCOPY  06/12/2011   Procedure: ESOPHAGOGASTRODUODENOSCOPY (EGD);  Surgeon: Yancey Flemings, MD;  Location: [facility];  Service: Endoscopy;  Laterality: N/A;   HARDWARE REMOVAL  02/23/2012   Procedure: HARDWARE REMOVAL;  Surgeon: Wyn Forster., MD;   Location: [facility];  Service: Orthopedics;  Laterality: Left;  biceps tenolysis and screw removal   HERNIA REPAIR  2013   Hiatal Hernia Repair   INGUINAL HERNIA REPAIR Right 12/09/2022   Procedure: OPEN RIGHT HERNIA REPAIR INGUINAL ADULT;  Surgeon: Berna Bue, MD;  Location: [facility];  Service: General;  Laterality: Right;   JOINT REPLACEMENT     KNEE ARTHROSCOPY Left 7/13 2017   NISSEN FUNDOPLICATION  2010   ORIF HUMERUS FRACTURE  12/24/2011   Procedure: OPEN REDUCTION INTERNAL FIXATION (ORIF) PROXIMAL HUMERUS FRACTURE;  Surgeon: Wyn Forster., MD;  Location: [facility];  Service: Orthopedics;  Laterality: Left;  ORIF left humerus   SHOULDER ARTHROSCOPY Right 1954   SHOULDER ARTHROSCOPY Left 05/28/2011   x 2   spine fussion     x2   TOTAL KNEE ARTHROPLASTY Left 10/27/2022   Procedure: LEFT TOTAL KNEE ARTHROPLASTY;  Surgeon: Kathryne Hitch, MD;  Location: [facility];  Service: Orthopedics;  Laterality: Left;   TOTAL SHOULDER ARTHROPLASTY Right 2023   x2   Social History   Occupational History   Occupation: retired  Tobacco Use   Smoking status: Former    Packs/day: 1.00    Years: 12.00    Additional pack years: 0.00    Total pack years: 12.00    Types: Cigarettes    Quit date: 05/26/1966    Years since quitting: 56.6   Smokeless tobacco: Never  Vaping Use   Vaping Use: Never used  Substance and Sexual Activity   Alcohol use: Yes    Alcohol/week: 1.0 standard drink of alcohol    Types: 1 Shots of liquor per week    Comment: red wine with dinner   Drug use: No   Sexual activity: Never

## 2023-01-04 ENCOUNTER — Encounter: Payer: Medicare PPO | Admitting: Physical Therapy

## 2023-01-06 ENCOUNTER — Encounter: Payer: Medicare PPO | Admitting: Physical Therapy

## 2023-01-08 ENCOUNTER — Telehealth: Payer: Self-pay | Admitting: Orthopaedic Surgery

## 2023-01-08 DIAGNOSIS — M47817 Spondylosis without myelopathy or radiculopathy, lumbosacral region: Secondary | ICD-10-CM | POA: Diagnosis not present

## 2023-01-08 NOTE — Telephone Encounter (Signed)
Pt called requesting to reschedule his last 4 appts he cancel. Was unsure if it was ok to reschedule or the reason pt cancelled last 4 appts. Please call pt about this matter Monday 7/1 if possible. Pt phone number is [phone].

## 2023-01-15 ENCOUNTER — Other Ambulatory Visit: Payer: Self-pay | Admitting: Orthopaedic Surgery

## 2023-01-15 ENCOUNTER — Other Ambulatory Visit: Payer: Self-pay | Admitting: Physician Assistant

## 2023-01-15 MED ORDER — TRAMADOL HCL 50 MG PO TABS: 50.0000 mg | ORAL_TABLET | Freq: Three times a day (TID) | ORAL | 0 refills | Status: DC | PRN

## 2023-01-15 NOTE — Telephone Encounter (Signed)
Mardella Layman approved this rx today but I do not have the ability to close can you please thanks

## 2023-01-15 NOTE — Telephone Encounter (Signed)
Sent to pharmacy on file 

## 2023-01-19 MED ORDER — TRAMADOL HCL 50 MG PO TABS: 50.0000 mg | ORAL_TABLET | Freq: Three times a day (TID) | ORAL | 0 refills | Status: DC | PRN

## 2023-01-30 ENCOUNTER — Other Ambulatory Visit: Payer: Self-pay | Admitting: Internal Medicine

## 2023-02-01 ENCOUNTER — Encounter: Payer: Self-pay | Admitting: Physical Therapy

## 2023-02-01 ENCOUNTER — Other Ambulatory Visit: Payer: Self-pay

## 2023-02-01 ENCOUNTER — Ambulatory Visit: Payer: Medicare PPO | Admitting: Physical Therapy

## 2023-02-01 DIAGNOSIS — M25562 Pain in left knee: Secondary | ICD-10-CM | POA: Diagnosis not present

## 2023-02-01 DIAGNOSIS — M6281 Muscle weakness (generalized): Secondary | ICD-10-CM

## 2023-02-01 DIAGNOSIS — M25551 Pain in right hip: Secondary | ICD-10-CM

## 2023-02-01 DIAGNOSIS — R2689 Other abnormalities of gait and mobility: Secondary | ICD-10-CM | POA: Diagnosis not present

## 2023-02-01 DIAGNOSIS — M5459 Other low back pain: Secondary | ICD-10-CM | POA: Diagnosis not present

## 2023-02-01 NOTE — Therapy (Unsigned)
OUTPATIENT PHYSICAL THERAPY EVALUATION Patient Name: Lucas Shepherd MRN: [id number] DOB:1929/03/16, 89 y.o., male Today's Date: 02/01/2023  END OF SESSION:  PT End of Session - 02/01/23 1717     Visit Number 1    Number of Visits 12    Date for PT Re-Evaluation 03/29/23    Authorization Type [employer]    Progress Note Due on Visit 10    PT Start Time 1516    PT Stop Time 1605    PT Time Calculation (min) 49 min    Activity Tolerance Patient tolerated treatment well    Behavior During Therapy WFL for tasks assessed/performed                Past Medical History:  Diagnosis Date   Anemia    related to past gastric ulcers   Arthritis    Blood transfusion    BPH (benign prostatic hyperplasia)    External hemorrhoids    GERD (gastroesophageal reflux disease)    nisseum fundoplication 2010   GI bleed 06/2011   got 3 units blood   Hiatal hernia    History of kidney stones    History of TMJ syndrome    mild   History of urinary frequency    Osteoporosis    Urinary urgency    Past Surgical History:  Procedure Laterality Date   ABDOMINAL SURGERY  1985   tummy tuc   CATARACT EXTRACTION Bilateral    COLONOSCOPY     ESOPHAGOGASTRODUODENOSCOPY  06/12/2011   Procedure: ESOPHAGOGASTRODUODENOSCOPY (EGD);  Surgeon: Yancey Flemings, MD;  Location: [facility];  Service: Endoscopy;  Laterality: N/A;   HARDWARE REMOVAL  02/23/2012   Procedure: HARDWARE REMOVAL;  Surgeon: Wyn Forster., MD;  Location: [facility];  Service: Orthopedics;  Laterality: Left;  biceps tenolysis and screw removal   HERNIA REPAIR  2013   Hiatal Hernia Repair   INGUINAL HERNIA REPAIR Right 12/09/2022   Procedure: OPEN RIGHT HERNIA REPAIR INGUINAL ADULT;  Surgeon: Berna Bue, MD;  Location: [facility];  Service: General;  Laterality: Right;   JOINT REPLACEMENT     KNEE ARTHROSCOPY Left 7/13 2017   NISSEN FUNDOPLICATION  2010   ORIF HUMERUS FRACTURE  12/24/2011    Procedure: OPEN REDUCTION INTERNAL FIXATION (ORIF) PROXIMAL HUMERUS FRACTURE;  Surgeon: Wyn Forster., MD;  Location: [facility];  Service: Orthopedics;  Laterality: Left;  ORIF left humerus   SHOULDER ARTHROSCOPY Right 1954   SHOULDER ARTHROSCOPY Left 05/28/2011   x 2   spine fussion     x2   TOTAL KNEE ARTHROPLASTY Left 10/27/2022   Procedure: LEFT TOTAL KNEE ARTHROPLASTY;  Surgeon: Kathryne Hitch, MD;  Location: [facility] OR;  Service: Orthopedics;  Laterality: Left;   TOTAL SHOULDER ARTHROPLASTY Right 2023   x2   Patient Active Problem List   Diagnosis Date Noted   Status post total left knee replacement 10/27/2022   Unilateral primary osteoarthritis, left knee 03/10/2022   Pain in right shoulder 08/21/2021   Pain in right hip 07/03/2020   Low back pain 07/03/2020   Status post laparoscopic fundoplication 07/24/2011   History of peptic ulcer 07/24/2011   Esophageal stricture 06/26/2011   H/O: upper GI bleed 06/26/2011   Edema extremities 06/26/2011   Chest pain 06/26/2011   Anemia due to blood loss, acute 06/26/2011   Syncope and collapse 06/11/2011   Melena 06/11/2011   Anemia 06/11/2011   Azotemia 06/11/2011  Volume depletion 06/11/2011   Leukocytosis 06/11/2011   DYSPHAGIA 08/02/2009   HIATAL HERNIA WITH REFLUX 01/30/2009   VITAMIN B12 DEFICIENCY 01/01/2009   BENIGN PROSTATIC HYPERTROPHY, WITH OBSTRUCTION 12/28/2008   FECAL INCONTINENCE 12/28/2008   GERD 12/27/2008   DIVERTICULOSIS, COLON 12/27/2008   RENAL CALCULUS 12/27/2008    PCP: Farris Has, MD  REFERRING PROVIDER: LIM, MOE  REFERRING DIAG: Lumbosacral spondylosis without myelopathy m47.817, Right hip abductor weakness THERAPY DIAG:  Muscle weakness (generalized)  Pain in right hip  Other low back pain  Acute pain of left knee  Other abnormalities of gait and mobility  Rationale for Evaluation and Treatment: Rehabilitation  ONSET DATE: 10/27/2022 Left TKA surgery, one  month onset of back pain SUBJECTIVE:   SUBJECTIVE STATEMENT:  He was coming to PT S/P Left TKA then had to discontinue his PT due to back pain. He says this started when he tried to use the larger heel lift in his shoe. He saw his back surgeon and brings in referral today for PT for his back and Rt hip abductor weakness. Of note his left knee feels okay but still with some pain at times.   PERTINENT HISTORY: Glut med tear of MRI 2021, OA, osteoporosis,  LBP, spine fusion, peptic ulcer, benign prostate hypertrophy with obstruction, diverticulosis, total shoulder arthroplasty X 2 on BUEs, left ORIF humeral fracture,   PAIN:  NPRS scale: 3-5 in back and left knee. Pain location: left knee in joint, proximally where tourniquet was located medially, anteriorly, and laterally.  Pain description: ache Aggravating factors: not taking Oxy,  tramadol at bedtime Relieving factors: meds, elevation, ice  PRECAUTIONS: Fall  WEIGHT BEARING RESTRICTIONS: Yes LLE WBAT  FALLS:  Has patient fallen in last 6 months? No  LIVING ENVIRONMENT: Lives with: lives with their spouse Lives in: House Stairs: single step with 2 grab bars to enter.  Home is 2 story but able to live on first floor.  Has following equipment at home: Single point cane, Walker - 2 wheeled, Grab bars, and built-in shower seat  OCCUPATION: retired.  PLOF: Independent  PATIENT GOALS:  golfing, being active in community.  Next MD visit: 12/09/2022  OBJECTIVE:  DIAGNOSTIC FINDINGS:  Glut med tear of MRI 2021 10/27/22 post surgery X-ray Postsurgical changes of left total knee arthroplasty. No evidence of immediate hardware complication.  PATIENT SURVEYS:  FOTO was just recently discharged so not done again  COGNITION: Overall cognitive status: WFL    SENSATION: WFL  ROM:   ROM Left  Eval     Lumbar flexion WNL     Lumbar  extension WNL     Lumbar sidebending WNL     Lumbar rotation WNL                 Knee flexion  P:115     Knee extension P: 5                              (Blank rows = not tested) 12/11/22: supine Lt knee flexion 110 deg, extension -5 deg  LOWER EXTREMITY MMT:  MMT Left Eval  Right Eval   Hip flexion 4+   Hip extension    Hip abduction 4 3  Hip adduction    Hip internal rotation 4+ 3  Hip external rotation 4+ 4  Knee flexion 5 5  Knee extension 5 5  Ankle dorsiflexion    Ankle plantarflexion    Ankle  inversion    Ankle eversion     (Blank rows = not tested)  GAIT: Eval  Distance walked: 100'  Assistive device utilized: [facility] Level of assistance: Mod I Comments: trendelenburg without AD  TODAY'S TREATMENT                                                                          DATE: 02/01/23 HEP creatiad   HOME EXERCISE PROGRAM: Access Code: [email or web address] URL: [email or web address] Date: 11/16/2022 Prepared by: Vladimir Faster  Exercises - Ankle Alphabet in Elevation  - 2-4 x daily - 7 x weekly - 3 sets - 1 reps - Quad Setting and Stretching  - 2-4 x daily - 7 x weekly - 5-10 sets - 10 reps - 5 hold - Supine Knee Extension Strengthening  - 1 x daily - 5 x weekly - 1 sets - 10 reps - 5 seconds hold - Supine Heel Slide with Strap  - 2-3 x daily - 7 x weekly - 2-3 sets - 10 reps - 5 seconds hold - Supine Straight Leg Raises  - 2-3 x daily - 7 x weekly - 2-3 sets - 10 reps - 5 seconds hold - Seated Knee Flexion Extension AROM   - 2-4 x daily - 7 x weekly - 2-3 sets - 10 reps - 5 seconds hold - Seated straight leg lifts  - 2-3 x daily - 7 x weekly - 2-3 sets - 10 reps - 5 seconds hold - Seated Hamstring Stretch with Strap  - 2-4 x daily - 7 x weekly - 1 sets - 3 reps - 20-30 seconds hold - Seated Long Arc Quad  - 1 x daily - 5 x weekly - 1 sets - 10 reps - 5 seconds hold  ASSESSMENT: CLINICAL IMPRESSION:  Patient is slowly improving functional strength with PT exercises.  Using a right heel lift seems to have reduced his hip discomfort.  Pt continues to  benefit from skilled PT.   OBJECTIVE IMPAIRMENTS: Abnormal gait, decreased activity tolerance, decreased balance, decreased endurance, decreased knowledge of condition, decreased knowledge of use of DME, decreased mobility, difficulty walking, decreased ROM, decreased strength, increased edema, and pain.   ACTIVITY LIMITATIONS: carrying, lifting, bending, sitting, standing, squatting, sleeping, stairs, transfers, and locomotion level  PARTICIPATION LIMITATIONS: meal prep, cleaning, laundry, driving, and community activity  PERSONAL FACTORS: Age, Fitness, Time since onset of injury/illness/exacerbation, and 1-2 comorbidities: see PMH  are also affecting patient's functional outcome.   REHAB POTENTIAL: Good  CLINICAL DECISION MAKING: Stable/uncomplicated  EVALUATION COMPLEXITY: Low   GOALS: Goals reviewed with patient? Yes  SHORT TERM GOALS: (target date for Short term goals 12/17/2022)   1.  Patient will demonstrate independent use of home exercise program to maintain progress from in clinic treatments.  Goal status: MET  2. PROM left knee ext -2* and flexion 100*  Goal status: Partially met 12/21/22 -5 to 112 deg  LONG TERM GOALS: (target dates for all long term goals  01/22/2023 )   1. Patient will demonstrate/report pain at worst less than or equal to 2/10 to facilitate minimal limitation in daily activity secondary to pain symptoms.  Goal status: ongoing 12/21/2022   2. Patient will demonstrate independent use of  home exercise program to facilitate ability to maintain/progress functional gains from skilled physical therapy services.  Goal status: ongoing 12/21/2022   3. Patient will demonstrate FOTO outcome > or = 44 % to indicate reduced disability due to condition.  Goal status: MET 12/21/22   4.  Patient will demonstrate left LE MMT 5/5 throughout to faciltiate usual transfers, stairs, squatting at [facility] for daily life.   Goal status:  MET for knee but still with some hip  weakness Rt more than Lt 12/21/22   5.  Patient ambulates with cane >300' and negotiates ramps / curbs independently.  Goal status: ongoing 12/21/2022   6.  left Knee PROM 0* to 105* Goal status: ongoing 12/21/22   7.  left knee AROM ext 0* and flexion 100* Goal Status: ongoing 12/21/22  PLAN:  PT FREQUENCY:  2-3x/week  PT DURATION: 10 weeks  PLANNED INTERVENTIONS: Therapeutic exercises, Therapeutic activity, Neuro Muscular re-education, Balance training, Gait training, Patient/Family education, Joint mobilization, Stair training, DME instructions, Dry Needling, Electrical stimulation, Traction, Cryotherapy, vasopneumatic deviceMoist heat, Taping, Ultrasound, Ionotophoresis 4mg /ml Dexamethasone, and aquatic therapy, Manual therapy.  All included unless contraindicated  PLAN FOR NEXT SESSION: do [employer] recert. Check strength with HH dynameter.  progress functional exercises & manual therapy for range /strength of left knee, and hip strength as well. Be aware of post op Rt inguinal hernia repair on 12/09/22.  Vaso for edema to end session.    April Manson, PT, DPT 02/01/2023, 5:18 PM

## 2023-02-08 ENCOUNTER — Encounter: Payer: Self-pay | Admitting: Internal Medicine

## 2023-02-08 ENCOUNTER — Encounter: Payer: Self-pay | Admitting: Orthopaedic Surgery

## 2023-02-13 ENCOUNTER — Other Ambulatory Visit: Payer: Self-pay

## 2023-02-13 ENCOUNTER — Emergency Department (HOSPITAL_BASED_OUTPATIENT_CLINIC_OR_DEPARTMENT_OTHER): Payer: Medicare PPO

## 2023-02-13 ENCOUNTER — Encounter (HOSPITAL_BASED_OUTPATIENT_CLINIC_OR_DEPARTMENT_OTHER): Payer: Self-pay | Admitting: Emergency Medicine

## 2023-02-13 ENCOUNTER — Emergency Department (HOSPITAL_BASED_OUTPATIENT_CLINIC_OR_DEPARTMENT_OTHER)
Admission: EM | Admit: 2023-02-13 | Discharge: 2023-02-13 | Disposition: A | Discharge status: Home / Self Care | Payer: Medicare PPO | Attending: Emergency Medicine | Admitting: Emergency Medicine

## 2023-02-13 ENCOUNTER — Emergency Department (HOSPITAL_COMMUNITY): Payer: Medicare PPO

## 2023-02-13 DIAGNOSIS — I6521 Occlusion and stenosis of right carotid artery: Secondary | ICD-10-CM | POA: Diagnosis not present

## 2023-02-13 DIAGNOSIS — R42 Dizziness and giddiness: Secondary | ICD-10-CM | POA: Insufficient documentation

## 2023-02-13 DIAGNOSIS — R9089 Other abnormal findings on diagnostic imaging of central nervous system: Secondary | ICD-10-CM | POA: Diagnosis not present

## 2023-02-13 DIAGNOSIS — Z0389 Encounter for observation for other suspected diseases and conditions ruled out: Secondary | ICD-10-CM | POA: Diagnosis not present

## 2023-02-13 LAB — CBC WITH DIFFERENTIAL/PLATELET
Abs Immature Granulocytes: 0.03 10*3/uL (ref 0.00–0.07)
Basophils Absolute: 0 10*3/uL (ref 0.0–0.1)
Basophils Relative: 1 %
Eosinophils Absolute: 0.2 10*3/uL (ref 0.0–0.5)
Eosinophils Relative: 3 %
HCT: 38.9 % — ABNORMAL LOW (ref 39.0–52.0)
Hemoglobin: 13 g/dL (ref 13.0–17.0)
Immature Granulocytes: 1 %
Lymphocytes Relative: 21 %
Lymphs Abs: 1.3 10*3/uL (ref 0.7–4.0)
MCH: 30 pg (ref 26.0–34.0)
MCHC: 33.4 g/dL (ref 30.0–36.0)
MCV: 89.8 fL (ref 80.0–100.0)
Monocytes Absolute: 0.8 10*3/uL (ref 0.1–1.0)
Monocytes Relative: 13 %
Neutro Abs: 3.8 10*3/uL (ref 1.7–7.7)
Neutrophils Relative %: 61 %
Platelets: 192 10*3/uL (ref 150–400)
RBC: 4.33 MIL/uL (ref 4.22–5.81)
RDW: 15.7 % — ABNORMAL HIGH (ref 11.5–15.5)
WBC: 6.2 10*3/uL (ref 4.0–10.5)
nRBC: 0 % (ref 0.0–0.2)

## 2023-02-13 LAB — COMPREHENSIVE METABOLIC PANEL
ALT: 9 U/L (ref 0–44)
AST: 19 U/L (ref 15–41)
Albumin: 4.3 g/dL (ref 3.5–5.0)
Alkaline Phosphatase: 38 U/L (ref 38–126)
Anion gap: 8 (ref 5–15)
BUN: 21 mg/dL (ref 8–23)
CO2: 26 mmol/L (ref 22–32)
Calcium: 9.7 mg/dL (ref 8.9–10.3)
Chloride: 104 mmol/L (ref 98–111)
Creatinine, Ser: 0.94 mg/dL (ref 0.61–1.24)
GFR, Estimated: >60 mL/min (ref >60)
Glucose, Bld: 90 mg/dL (ref 70–99)
Potassium: 4.1 mmol/L (ref 3.5–5.1)
Sodium: 138 mmol/L (ref 135–145)
Total Bilirubin: 0.6 mg/dL (ref 0.3–1.2)
Total Protein: 7.1 g/dL (ref 6.5–8.1)

## 2023-02-13 MED ORDER — IOHEXOL 350 MG/ML SOLN
100.0000 mL | Freq: Once | INTRAVENOUS | Status: AC | PRN
  Administered 2023-02-13: 75 mL via INTRAVENOUS

## 2023-02-13 MED ORDER — MECLIZINE HCL 25 MG PO TABS
25.0000 mg | ORAL_TABLET | Freq: Once | ORAL | Status: AC
  Administered 2023-02-13: 25 mg via ORAL
  Filled 2023-02-13: qty 1

## 2023-02-13 NOTE — ED Provider Notes (Signed)
[place] EMERGENCY DEPARTMENT AT [facility] Provider Note   CSN: [id number] Arrival date & time: 02/13/23  1549     History {Add pertinent medical, surgical, social history, OB history to HPI:1} Chief Complaint  Patient presents with   Dizziness    Lucas Shepherd is a 89 y.o. male   Dizziness Quality:  Head spinning Severity:  Moderate Onset quality: awoke with symptoms. Timing:  Constant Progression:  Unchanged Chronicity: fleeting episodes lasting no more than an minute or two in the past. Relieved by:  Nothing Worsened by:  Standing up Ineffective treatments:  None tried Associated symptoms: headaches   Associated symptoms: no blood in stool, no chest pain, no diarrhea, no hearing loss, no nausea, no palpitations, no shortness of breath, no syncope, no tinnitus, no vision changes, no vomiting and no weakness   Associated symptoms comment:  Has associated imbalance  Risk factors: no hx of stroke and no new medications        Home Medications Prior to Admission medications   Medication Sig Start Date End Date Taking? Authorizing Provider  acetaminophen (TYLENOL) 500 MG tablet Take 500 mg by mouth every 6 (six) hours as needed for moderate pain or mild pain.    [provider]  Calcium Carb-Cholecalciferol (CALCIUM + D3 PO) Take 1 tablet by mouth daily after breakfast.    [provider]  COLLAGEN PO Take 1 Scoop by mouth daily before breakfast. (Coffee)    [provider]  cyanocobalamin (VITAMIN B12) 1000 MCG tablet Take 1,000 mcg by mouth 3 (three) times a week. (Sundays, Tuesdays & Fridays)    [provider]  denosumab (PROLIA) 60 MG/ML SOSY injection Inject 60 mg into the skin every 6 (six) months.    [provider]  famotidine (PEPCID) 20 MG tablet TAKE 1 TABLET (20 MG) BY MOUTH EVERY MORNING AND 2 TABLETS (40 MG) BY MOUTH EVERY EVENING. 02/01/23   Pyrtle, Carie Caddy, MD  finasteride (PROSCAR) 5 MG tablet Take  5 mg by mouth at bedtime.    [provider]  fluticasone (FLONASE) 50 MCG/ACT nasal spray Place 1 spray into both nostrils at bedtime. 09/06/19   [provider]  gabapentin (NEURONTIN) 300 MG capsule Take 300 mg by mouth daily after supper.    [provider]  Magnesium Oxide, Laxative, 500 MG TABS Take 500 mg by mouth daily after breakfast.    [provider]  Methylcellulose, Laxative, (CITRUCEL FIBERSHAKE PO) Take 1 Scoop by mouth daily before breakfast. Mix 1 scoopful into 8 ounces of water and drink every morning    [provider]  Multiple Vitamin (MULTIVITAMIN WITH MINERALS) TABS tablet Take 1 tablet by mouth daily after breakfast.    [provider]  Multiple Vitamins-Minerals (PRESERVISION AREDS 2 PO) Take 1 tablet by mouth in the morning and at bedtime.    [provider]  polyethylene glycol powder (GLYCOLAX/MIRALAX) 17 GM/SCOOP powder Take 17 g by mouth daily before breakfast. (Coffee)    [provider]  tamsulosin (FLOMAX) 0.4 MG CAPS capsule Take 0.4 mg by mouth at bedtime.    [provider]  traMADol (ULTRAM) 50 MG tablet Take 1-2 tablets (50-100 mg total) by mouth 3 (three) times daily as needed. 01/19/23   Adonis Huguenin, NP  TURMERIC PO Take 1 capsule by mouth daily after breakfast.    [provider]      Allergies    Aspirin and Nsaids    Review of  Systems   Review of Systems  HENT:  Negative for hearing loss and tinnitus.   Respiratory:  Negative for shortness of breath.   Cardiovascular:  Negative for chest pain, palpitations and syncope.  Gastrointestinal:  Negative for blood in stool, diarrhea, nausea and vomiting.  Neurological:  Positive for dizziness and headaches. Negative for weakness.    Physical Exam Updated Vital Signs BP (!) 164/82 (BP Location: Right Arm)   Pulse (!) 59   Temp 98.3 F (36.8 C)   Resp 17   SpO2 100%  Physical Exam Vitals and nursing note  reviewed.  Constitutional:      General: He is not in acute distress.    Appearance: He is well-developed. He is not diaphoretic.  HENT:     Head: Normocephalic and atraumatic.     Right Ear: Tympanic membrane, ear canal and external ear normal. Decreased hearing noted.     Left Ear: Tympanic membrane, ear canal and external ear normal. Decreased hearing noted.     Ears:     Comments: Wears hearing aids Eyes:     General: No scleral icterus.    Extraocular Movements:     Right eye: Normal extraocular motion and no nystagmus.     Left eye: Normal extraocular motion and no nystagmus.     Conjunctiva/sclera: Conjunctivae normal.  Cardiovascular:     Rate and Rhythm: Normal rate and regular rhythm.     Heart sounds: Normal heart sounds.  Pulmonary:     Effort: Pulmonary effort is normal. No respiratory distress.     Breath sounds: Normal breath sounds.  Abdominal:     Palpations: Abdomen is soft.     Tenderness: There is no abdominal tenderness.  Musculoskeletal:     Cervical back: Normal range of motion and neck supple.  Skin:    General: Skin is warm and dry.  Neurological:     Mental Status: He is alert and oriented to person, place, and time.     GCS: GCS eye subscore is 4. GCS verbal subscore is 5. GCS motor subscore is 6.     Cranial Nerves: Cranial nerves 2-12 are intact.     Sensory: Sensation is intact.     Motor: Tremor present. No weakness or pronator drift.     Coordination: Finger-Nose-Finger Test normal.     Gait: Gait abnormal.     Deep Tendon Reflexes: Reflexes are normal and symmetric.     Comments: Disequilibrium noted on gait and balance  Psychiatric:        Behavior: Behavior normal.     ED Results / Procedures / Treatments   Labs (all labs ordered are listed, but only abnormal results are displayed) Labs Reviewed - No data to display  EKG None  Radiology No results found.  Procedures Procedures  {Document cardiac monitor, telemetry assessment  procedure when appropriate:1}  Medications Ordered in ED Medications - No data to display  ED Course/ Medical Decision Making/ A&P   {   Click here for ABCD2, HEART and other calculatorsREFRESH Note before signing :1}                              Medical Decision Making  This patient presents to the ED for concern of vertigo, this involves an extensive number of treatment options, and is a complaint that carries with it a high risk of complications and morbidity.  The emergent differential diagnosis for acute  vertigo low includes peripheral causes such as BPPV, barotrauma, ear foreign body, Mnire's disease, infectious causes such as lip bronchitis, vestibular neuritis or Ramsay Hunt syndrome.  Other emergent causes are central such as cerebellar stroke, vertebrobasilar insufficiency, neoplastic causes, vertebral artery dissection, MS, neurosyphilis or tuberculosis, epilepsy or migraine.  Other causes include anemia, hyperviscosity syndrome, alcohol or aminoglycoside use, renal failure, hypoglycemia and thyroid disease.   Co morbidities:   has a past medical history of Anemia, Arthritis, Blood transfusion, BPH (benign prostatic hyperplasia), External hemorrhoids, GERD (gastroesophageal reflux disease), GI bleed (06/2011), Hiatal hernia, History of kidney stones, History of TMJ syndrome, History of urinary frequency, Osteoporosis, and Urinary urgency.   Social Determinants of Health:   SDOH Screenings   Food Insecurity: No Food Insecurity (12/30/2021)   [facility] from [facility], [facility]  Transportation Needs: No Transportation Needs (12/30/2021)   [facility] from [facility], [facility]  Financial Resource Strain: Low Risk  (12/30/2021)   Received from [facility], [facility]  Tobacco Use: Medium Risk (02/13/2023)     Additional history:  {Additional history obtained from son at bedside   Lab Tests:  I Ordered, and personally interpreted labs.  The  pertinent results include:   ***  Imaging Studies:  I ordered imaging studies including *** I independently visualized and interpreted imaging which showed *** I agree with the radiologist interpretation  Cardiac Monitoring/ECG:  The patient was maintained on a cardiac monitor.  I personally viewed and interpreted the cardiac monitored which showed an underlying rhythm of: ***  Medicines ordered and prescription drug management:  I ordered medication including  Medications  meclizine (ANTIVERT) tablet 25 mg (has no administration in time range)   for *** Reevaluation of the patient after these medicines showed that the patient {resolved/improved/worsened:23923::"improved"} I have reviewed the patients home medicines and have made adjustments as needed  Test Considered:  ***  Critical Interventions:  ***  Consultations Obtained: ***  Problem List / ED Course:  No diagnosis found.  MDM: ***   Dispostion:  After consideration of the diagnostic results and the patients response to treatment, I feel that the patent would benefit from ***.   {Document critical care time when appropriate:1} {Document review of labs and clinical decision tools ie heart score, Chads2Vasc2 etc:1}  {Document your independent review of radiology images, and any outside records:1} {Document your discussion with family members, caretakers, and with consultants:1} {Document social determinants of health affecting pt's care:1} {Document your decision making why or why not admission, treatments were needed:1} Final Clinical Impression(s) / ED Diagnoses Final diagnoses:  None    Rx / DC Orders ED Discharge Orders     None

## 2023-02-13 NOTE — ED Triage Notes (Signed)
Congested for several weeks. No OTC medicines

## 2023-02-13 NOTE — Discharge Instructions (Addendum)
The MRI of your brain did not show any acute findings, you were given a copy of this report at discharge.  Please follow-up with your primary care physician as needed.

## 2023-02-13 NOTE — ED Provider Notes (Signed)
Accepted handoff at shift change from [facility]. Please see prior provider note for more detail.   Briefly: Patient is 89 y.o.   DDX: concern for History of intermittent but fleeting episodes of vertigo / head spinning. Usually in the morning, sometime randomly throughout the day. Has had this all day. Has had dysequilibrium / gait in the past. This feels different. No nystagmus, not reproducible with head movement. Head fullness and intermittent headache noted. He has some equal dysmetria possibly 2/2 baseline tremor. Somewhat unsteady at baseline, this is worse than normal.   Plan: Transfer to [facility] for MR brain as I have an overall high clinical suspicion of ongoing possible posterior circulation stroke.  Spoke with Dr. Jacalyn Lefevre who agrees to accept admission at this time  Patient transferred POV at this time for MRI of the brain.      RISR  EDTHIS    [place] 02/13/23 1932    Benjiman Core, MD 02/13/23 [phone]

## 2023-02-13 NOTE — ED Notes (Signed)
Spoke to Consulting civil engineer at [employer] to let them be aware Pt is coming POV for MRI.

## 2023-02-13 NOTE — ED Triage Notes (Signed)
Dizzy since waking up this am, and slight headache. Pt states he doesn't usually have high blood pressure, and he checked it and has been in the 180's.

## 2023-02-13 NOTE — ED Provider Notes (Signed)
  Physical Exam  BP (!) 194/83   Pulse 70   Temp 98.3 F (36.8 C)   Resp 16   SpO2 100%   Physical Exam  Procedures  Procedures  ED Course / MDM   Clinical Course as of 02/13/23 2237  Sat Feb 13, 2023  1802 CBC with Differential(!) [AH]  1802 WBC: 6.2 [AH]  1803 Comprehensive metabolic panel [AH]    Clinical Course User Index [AH] Arthor Captain, PA-C   Medical Decision Making Amount and/or Complexity of Data Reviewed Labs: ordered. Decision-making details documented in ED Course. Radiology: ordered.  Risk Prescription drug management.   Patient was a transfer from [facility] for MRI brain.  Briefly patient woke up with dizziness, head spinning.  Negative CT angio, neurology recommended MRI brain. MRI brain without any acute findings at this time.  Patient was ambulated by me with a steady gait but he does use a cane for ambulation which is normal for him at his baseline.  His vitals are otherwise within normal limits.  He does feel improvement in his symptoms after receiving the meclizine [facility].  He is hemodynamically stable.  Will be driven home by his grandson who is at the bedside present agreeable to plan and treatment.  Portions of this note were generated with Scientist, clinical (histocompatibility and immunogenetics). Dictation errors may occur despite best attempts at proofreading.         Claude Manges, PA-C 02/13/23 2237    Gwyneth Sprout, MD 02/14/23 [phone]

## 2023-02-15 DIAGNOSIS — D692 Other nonthrombocytopenic purpura: Secondary | ICD-10-CM | POA: Diagnosis not present

## 2023-02-15 DIAGNOSIS — L570 Actinic keratosis: Secondary | ICD-10-CM | POA: Diagnosis not present

## 2023-02-15 DIAGNOSIS — D225 Melanocytic nevi of trunk: Secondary | ICD-10-CM | POA: Diagnosis not present

## 2023-02-15 DIAGNOSIS — L821 Other seborrheic keratosis: Secondary | ICD-10-CM | POA: Diagnosis not present

## 2023-02-15 DIAGNOSIS — Z85828 Personal history of other malignant neoplasm of skin: Secondary | ICD-10-CM | POA: Diagnosis not present

## 2023-02-17 ENCOUNTER — Ambulatory Visit: Payer: Medicare PPO | Admitting: Physical Therapy

## 2023-02-17 ENCOUNTER — Encounter: Payer: Self-pay | Admitting: Physical Therapy

## 2023-02-17 DIAGNOSIS — M25551 Pain in right hip: Secondary | ICD-10-CM

## 2023-02-17 DIAGNOSIS — M6281 Muscle weakness (generalized): Secondary | ICD-10-CM | POA: Diagnosis not present

## 2023-02-17 DIAGNOSIS — R2689 Other abnormalities of gait and mobility: Secondary | ICD-10-CM | POA: Diagnosis not present

## 2023-02-17 DIAGNOSIS — M25562 Pain in left knee: Secondary | ICD-10-CM | POA: Diagnosis not present

## 2023-02-17 DIAGNOSIS — M5459 Other low back pain: Secondary | ICD-10-CM | POA: Diagnosis not present

## 2023-02-17 NOTE — Therapy (Signed)
OUTPATIENT PHYSICAL THERAPY EVALUATION Patient Name: Lucas Shepherd MRN: [id number] DOB:10-14-1928, 89 y.o., male Today's Date: 02/17/2023  END OF SESSION:  PT End of Session - 02/17/23 1112     Visit Number 2    Number of Visits 12    Date for PT Re-Evaluation 03/29/23    Authorization Type [employer]    Authorization - Visit Number 2    Authorization - Number of Visits 12    Progress Note Due on Visit 10    PT Start Time 1100    PT Stop Time 1145    PT Time Calculation (min) 45 min    Activity Tolerance Patient tolerated treatment well    Behavior During Therapy WFL for tasks assessed/performed                 Past Medical History:  Diagnosis Date   Anemia    related to past gastric ulcers   Arthritis    Blood transfusion    BPH (benign prostatic hyperplasia)    External hemorrhoids    GERD (gastroesophageal reflux disease)    nisseum fundoplication 2010   GI bleed 06/2011   got 3 units blood   Hiatal hernia    History of kidney stones    History of TMJ syndrome    mild   History of urinary frequency    Osteoporosis    Urinary urgency    Past Surgical History:  Procedure Laterality Date   ABDOMINAL SURGERY  1985   tummy tuc   CATARACT EXTRACTION Bilateral    COLONOSCOPY     ESOPHAGOGASTRODUODENOSCOPY  06/12/2011   Procedure: ESOPHAGOGASTRODUODENOSCOPY (EGD);  Surgeon: Yancey Flemings, MD;  Location: [facility];  Service: Endoscopy;  Laterality: N/A;   HARDWARE REMOVAL  02/23/2012   Procedure: HARDWARE REMOVAL;  Surgeon: Wyn Forster., MD;  Location: [facility];  Service: Orthopedics;  Laterality: Left;  biceps tenolysis and screw removal   HERNIA REPAIR  2013   Hiatal Hernia Repair   INGUINAL HERNIA REPAIR Right 12/09/2022   Procedure: OPEN RIGHT HERNIA REPAIR INGUINAL ADULT;  Surgeon: Berna Bue, MD;  Location: [facility];  Service: General;  Laterality: Right;   JOINT REPLACEMENT     KNEE ARTHROSCOPY Left 7/13  2017   NISSEN FUNDOPLICATION  2010   ORIF HUMERUS FRACTURE  12/24/2011   Procedure: OPEN REDUCTION INTERNAL FIXATION (ORIF) PROXIMAL HUMERUS FRACTURE;  Surgeon: Wyn Forster., MD;  Location: [facility];  Service: Orthopedics;  Laterality: Left;  ORIF left humerus   SHOULDER ARTHROSCOPY Right 1954   SHOULDER ARTHROSCOPY Left 05/28/2011   x 2   spine fussion     x2   TOTAL KNEE ARTHROPLASTY Left 10/27/2022   Procedure: LEFT TOTAL KNEE ARTHROPLASTY;  Surgeon: Kathryne Hitch, MD;  Location: [facility] OR;  Service: Orthopedics;  Laterality: Left;   TOTAL SHOULDER ARTHROPLASTY Right 2023   x2   Patient Active Problem List   Diagnosis Date Noted   Status post total left knee replacement 10/27/2022   Unilateral primary osteoarthritis, left knee 03/10/2022   Pain in right shoulder 08/21/2021   Pain in right hip 07/03/2020   Low back pain 07/03/2020   Status post laparoscopic fundoplication 07/24/2011   History of peptic ulcer 07/24/2011   Esophageal stricture 06/26/2011   H/O: upper GI bleed 06/26/2011   Edema extremities 06/26/2011   Chest pain 06/26/2011   Anemia due to blood loss, acute 06/26/2011  Syncope and collapse 06/11/2011   Melena 06/11/2011   Anemia 06/11/2011   Azotemia 06/11/2011   Volume depletion 06/11/2011   Leukocytosis 06/11/2011   DYSPHAGIA 08/02/2009   HIATAL HERNIA WITH REFLUX 01/30/2009   VITAMIN B12 DEFICIENCY 01/01/2009   BENIGN PROSTATIC HYPERTROPHY, WITH OBSTRUCTION 12/28/2008   FECAL INCONTINENCE 12/28/2008   GERD 12/27/2008   DIVERTICULOSIS, COLON 12/27/2008   RENAL CALCULUS 12/27/2008    PCP: Farris Has, MD  REFERRING PROVIDER: LIM, MOE  REFERRING DIAG: Lumbosacral spondylosis without myelopathy m47.817, Right hip abductor weakness THERAPY DIAG:  Muscle weakness (generalized)  Pain in right hip  Other low back pain  Acute pain of left knee  Other abnormalities of gait and mobility  Rationale for Evaluation and  Treatment: Rehabilitation  ONSET DATE: 10/27/2022 Left TKA surgery, one month onset of back pain SUBJECTIVE:   SUBJECTIVE STATEMENT:  He relays he has not been as compliant with HEP as he should. He also has been having some blood pressure issues and dizziness and he checked his BP this morning and it was a little low. He does endorse less back pain today.  PERTINENT HISTORY: Glut med tear of MRI 2021, OA, osteoporosis,  LBP, spine fusion, peptic ulcer, benign prostate hypertrophy with obstruction, diverticulosis, total shoulder arthroplasty X 2 on BUEs, left ORIF humeral fracture,   PAIN:  NPRS scale: " enough to let me know its there" Pain location: left knee in joint, proximally where tourniquet was located medially, anteriorly, and laterally.  Pain description: ache Aggravating factors: not taking Oxy,  tramadol at bedtime Relieving factors: meds, elevation, ice  PRECAUTIONS: Fall  WEIGHT BEARING RESTRICTIONS: Yes LLE WBAT  FALLS:  Has patient fallen in last 6 months? No  LIVING ENVIRONMENT: Lives with: lives with their spouse Lives in: House Stairs: single step with 2 grab bars to enter.  Home is 2 story but able to live on first floor.  Has following equipment at home: Single point cane, Walker - 2 wheeled, Grab bars, and built-in shower seat  OCCUPATION: retired.  PLOF: Independent  PATIENT GOALS:  golfing, being active in community.  Next MD visit: 12/09/2022  OBJECTIVE:  DIAGNOSTIC FINDINGS:  Glut med tear of MRI 2021 10/27/22 post surgery X-ray Postsurgical changes of left total knee arthroplasty. No evidence of immediate hardware complication.  PATIENT SURVEYS:  FOTO was just recently discharged so not done again  COGNITION: Overall cognitive status: WFL    SENSATION: WFL  ROM:   ROM Left  Eval     Lumbar flexion WNL     Lumbar  extension WNL     Lumbar sidebending WNL     Lumbar rotation WNL                 Knee flexion P:115     Knee extension  P: 5                              (Blank rows = not tested) 12/11/22: supine Lt knee flexion 110 deg, extension -5 deg  LOWER EXTREMITY MMT:  MMT Left Eval  Right Eval   Hip flexion 4+   Hip extension    Hip abduction 4 3  Hip adduction    Hip internal rotation 4+ 3  Hip external rotation 4+ 4  Knee flexion 5 5  Knee extension 5 5  Ankle dorsiflexion    Ankle plantarflexion    Ankle inversion    Ankle  eversion     (Blank rows = not tested)  GAIT: Eval  Distance walked: 100'  Assistive device utilized: SPC Level of assistance: Mod I Comments: trendelenburg without AD  TODAY'S TREATMENT                                                                          DATE: 02/17/23 Nu step L5 X 8 min Leg press DL 78# [id number], then SL 21# [id number] each side Seated SLR 2X10 bilat Standing hip abduction X 15 bilat red band Standing hip extension X 15 bilat red band Seated bilat clams 2X15 with blue band around knees Seated bilat hip IR 2 X 10 holding 3 sec with red band around ankles and yellow ball between knees Seated Rt hip ER with PT holding red band 2X10    02/01/23 HEP created for hip/back and reviewed with trial set of each performed Nu step L5 X 6 min    HOME EXERCISE PROGRAM: Access Code: [email or web address] URL: [email or web address] Date: 02/02/2023 Prepared by: Ivery Quale  Exercises - Sidelying Hip Abduction  - 2 x daily - 6 x weekly - 1-2 sets - 10 reps - Sidelying Hip Circles  - 2 x daily - 6 x weekly - 1 sets - 10 reps - Sidelying Reverse Clamshell (Mirrored)  - 2 x daily - 6 x weekly - 1-2 sets - 10 reps - Clamshell with Resistance  - 2 x daily - 6 x weekly - 1-2 sets - 15 reps - Standing Hip Abduction with Resistance at Ankles and Counter Support  - 2 x daily - 6 x weekly - 1 sets - 15 reps - Standing Hip Extension with Resistance at Ankles and Counter Support  - 2 x daily - 6 x weekly - 1 sets - 15 reps - Standing Single Leg Stance with Unilateral  Counter Support  - 2 x daily - 6 x weekly - 1 sets - 3 reps - 20-30 sec hold   ASSESSMENT: CLINICAL IMPRESSION: Session focused on knee and hip strength focus with good overall tolerance to this. He was encouraged to try to be more consistent with HEP so he has better chance to improve his strength limitations.   OBJECTIVE IMPAIRMENTS: Abnormal gait, decreased activity tolerance, decreased balance, decreased endurance,  decreased mobility, difficulty walking, decreased ROM, decreased strength, and pain.   ACTIVITY LIMITATIONS: carrying, lifting, bending, sitting, standing, squatting, sleeping, stairs, transfers, and locomotion level  PARTICIPATION LIMITATIONS: meal prep, cleaning, laundry, driving, and community activity  PERSONAL FACTORS: Age, Fitness, Time since onset of injury/illness/exacerbation, and 1-2 comorbidities: see PMH  are also affecting patient's functional outcome.   REHAB POTENTIAL: Good  CLINICAL DECISION MAKING: Stable/uncomplicated  EVALUATION COMPLEXITY: Low   GOALS: Goals reviewed with patient? Yes  SHORT TERM GOALS: (03/02/2023    1.  Patient will demonstrate independent use of home exercise program to maintain progress from in clinic treatments.  Goal status: NEW  LONG TERM GOALS: (03/16/2023    1. Patient will demonstrate/report pain at worst less than or equal to 3/10 to facilitate minimal limitation in daily activity secondary to pain symptoms.  Goal status: NEW     2.  Patient will demonstrate Rt hip  MMT 4/5 throughout  to faciltiate usual transfers, stairs, squatting at [facility] for daily life.   Goal status:  MET for knee but still with some hip weakness Rt more than Lt 12/21/22    3.  left Knee PROM 3* to 115* Goal status: new  PLAN:  PT FREQUENCY:  2-3x/week  PT DURATION: 10 weeks  PLANNED INTERVENTIONS: Therapeutic exercises, Therapeutic activity, Neuro Muscular re-education, Balance training, Gait training, Patient/Family education, Joint  mobilization, Stair training, DME instructions, Dry Needling, Electrical stimulation, Traction, Cryotherapy, vasopneumatic deviceMoist heat, Taping, Ultrasound, Ionotophoresis 4mg /ml Dexamethasone, and aquatic therapy, Manual therapy.  All included unless contraindicated  PLAN FOR NEXT SESSION: Rt hip strength focus now, continue work for left knee strength and extension ROM.  Referring diagnosis? M47.817 Treatment diagnosis? (if different than referring diagnosis) M62.81 What was this (referring dx) caused by? [x]  Surgery []  Fall []  Ongoing issue [x]  Arthritis []  Other: ____________  Laterality: []  Rt []  Lt [x]  Both  Check all possible CPT codes:  *CHOOSE 10 OR LESS*    [x]  97110 (Therapeutic Exercise)  []  92507 (SLP Treatment)  [x]  97112 (Neuro Re-ed)   []  92526 (Swallowing Treatment)   [x]  97116 (Gait Training)   []  [id number] (Cognitive Training, 1st 15 minutes) [x]  97140 (Manual Therapy)   []  97130 (Cognitive Training, each add'l 15 minutes)  []  97164 (Re-evaluation)                              []  Other, List CPT Code ____________  [x]  97530 (Therapeutic Activities)     [x]  97535 (Self Care)   []  All codes above (97110 - 97535)  []  97012 (Mechanical Traction)  []  97014 (E-stim Unattended)  []  97032 (E-stim manual)  []  97033 (Ionto)  []  97035 (Ultrasound) []  97750 (Physical Performance Training) []  [id number] (Aquatic Therapy) []  97016 (Vasopneumatic Device) []  97018 (Paraffin) []  97034 (Contrast Bath) []  97597 (Wound Care 1st 20 sq cm) []  97598 (Wound Care each add'l 20 sq cm) []  97760 (Orthotic Fabrication, Fitting, Training Initial) []  [id number] (Prosthetic Management and Training Initial) []  [id number] (Orthotic or Prosthetic Training/ Modification Subsequent)    April Manson, PT, DPT 02/17/2023, 11:13 AM

## 2023-02-19 DIAGNOSIS — R42 Dizziness and giddiness: Secondary | ICD-10-CM | POA: Diagnosis not present

## 2023-02-19 DIAGNOSIS — I959 Hypotension, unspecified: Secondary | ICD-10-CM | POA: Diagnosis not present

## 2023-02-22 ENCOUNTER — Ambulatory Visit: Payer: Medicare PPO | Admitting: Physical Therapy

## 2023-02-22 ENCOUNTER — Encounter: Payer: Self-pay | Admitting: Physical Therapy

## 2023-02-22 DIAGNOSIS — M6281 Muscle weakness (generalized): Secondary | ICD-10-CM | POA: Diagnosis not present

## 2023-02-22 DIAGNOSIS — M25551 Pain in right hip: Secondary | ICD-10-CM | POA: Diagnosis not present

## 2023-02-22 DIAGNOSIS — R2689 Other abnormalities of gait and mobility: Secondary | ICD-10-CM

## 2023-02-22 DIAGNOSIS — M5459 Other low back pain: Secondary | ICD-10-CM | POA: Diagnosis not present

## 2023-02-22 DIAGNOSIS — M25562 Pain in left knee: Secondary | ICD-10-CM | POA: Diagnosis not present

## 2023-02-22 NOTE — Therapy (Signed)
OUTPATIENT PHYSICAL THERAPY EVALUATION Patient Name: Lucas Shepherd MRN: [id number] DOB:1928/08/14, 89 y.o., male Today's Date: 02/22/2023  END OF SESSION:  PT End of Session - 02/22/23 1423     Visit Number 3    Number of Visits 12    Date for PT Re-Evaluation 03/29/23    Authorization Type [employer]    Authorization - Visit Number 3    Authorization - Number of Visits 12    Progress Note Due on Visit 10    PT Start Time 1345    PT Stop Time 1425    PT Time Calculation (min) 40 min    Activity Tolerance Patient tolerated treatment well    Behavior During Therapy WFL for tasks assessed/performed                  Past Medical History:  Diagnosis Date   Anemia    related to past gastric ulcers   Arthritis    Blood transfusion    BPH (benign prostatic hyperplasia)    External hemorrhoids    GERD (gastroesophageal reflux disease)    nisseum fundoplication 2010   GI bleed 06/2011   got 3 units blood   Hiatal hernia    History of kidney stones    History of TMJ syndrome    mild   History of urinary frequency    Osteoporosis    Urinary urgency    Past Surgical History:  Procedure Laterality Date   ABDOMINAL SURGERY  1985   tummy tuc   CATARACT EXTRACTION Bilateral    COLONOSCOPY     ESOPHAGOGASTRODUODENOSCOPY  06/12/2011   Procedure: ESOPHAGOGASTRODUODENOSCOPY (EGD);  Surgeon: Yancey Flemings, MD;  Location: [facility];  Service: Endoscopy;  Laterality: N/A;   HARDWARE REMOVAL  02/23/2012   Procedure: HARDWARE REMOVAL;  Surgeon: Wyn Forster., MD;  Location: [facility];  Service: Orthopedics;  Laterality: Left;  biceps tenolysis and screw removal   HERNIA REPAIR  2013   Hiatal Hernia Repair   INGUINAL HERNIA REPAIR Right 12/09/2022   Procedure: OPEN RIGHT HERNIA REPAIR INGUINAL ADULT;  Surgeon: Berna Bue, MD;  Location: [facility] OR;  Service: General;  Laterality: Right;   JOINT REPLACEMENT     KNEE ARTHROSCOPY Left 7/13  2017   NISSEN FUNDOPLICATION  2010   ORIF HUMERUS FRACTURE  12/24/2011   Procedure: OPEN REDUCTION INTERNAL FIXATION (ORIF) PROXIMAL HUMERUS FRACTURE;  Surgeon: Wyn Forster., MD;  Location: [facility];  Service: Orthopedics;  Laterality: Left;  ORIF left humerus   SHOULDER ARTHROSCOPY Right 1954   SHOULDER ARTHROSCOPY Left 05/28/2011   x 2   spine fussion     x2   TOTAL KNEE ARTHROPLASTY Left 10/27/2022   Procedure: LEFT TOTAL KNEE ARTHROPLASTY;  Surgeon: Kathryne Hitch, MD;  Location: [facility];  Service: Orthopedics;  Laterality: Left;   TOTAL SHOULDER ARTHROPLASTY Right 2023   x2   Patient Active Problem List   Diagnosis Date Noted   Status post total left knee replacement 10/27/2022   Unilateral primary osteoarthritis, left knee 03/10/2022   Pain in right shoulder 08/21/2021   Pain in right hip 07/03/2020   Low back pain 07/03/2020   Status post laparoscopic fundoplication 07/24/2011   History of peptic ulcer 07/24/2011   Esophageal stricture 06/26/2011   H/O: upper GI bleed 06/26/2011   Edema extremities 06/26/2011   Chest pain 06/26/2011   Anemia due to blood loss, acute 06/26/2011  Syncope and collapse 06/11/2011   Melena 06/11/2011   Anemia 06/11/2011   Azotemia 06/11/2011   Volume depletion 06/11/2011   Leukocytosis 06/11/2011   DYSPHAGIA 08/02/2009   HIATAL HERNIA WITH REFLUX 01/30/2009   VITAMIN B12 DEFICIENCY 01/01/2009   BENIGN PROSTATIC HYPERTROPHY, WITH OBSTRUCTION 12/28/2008   FECAL INCONTINENCE 12/28/2008   GERD 12/27/2008   DIVERTICULOSIS, COLON 12/27/2008   RENAL CALCULUS 12/27/2008    PCP: Farris Has, MD  REFERRING PROVIDER: LIM, MOE  REFERRING DIAG: Lumbosacral spondylosis without myelopathy m47.817, Right hip abductor weakness THERAPY DIAG:  Muscle weakness (generalized)  Pain in right hip  Other low back pain  Acute pain of left knee  Other abnormalities of gait and mobility  Rationale for Evaluation and  Treatment: Rehabilitation  ONSET DATE: 10/27/2022 Left TKA surgery, one month onset of back pain SUBJECTIVE:   SUBJECTIVE STATEMENT:   Pt stating he has been doing a little better with his HEP.   PERTINENT HISTORY: Glut med tear of MRI 2021, OA, osteoporosis,  LBP, spine fusion, peptic ulcer, benign prostate hypertrophy with obstruction, diverticulosis, total shoulder arthroplasty X 2 on BUEs, left ORIF humeral fracture,   PAIN:  NPRS scale: 1/10 in low back Pain location: left knee in joint pain comes and goes currently 2/10 Pain description: ache Aggravating factors: not taking Oxy,  tramadol at bedtime Relieving factors: meds, elevation, ice  PRECAUTIONS: Fall  WEIGHT BEARING RESTRICTIONS: Yes LLE WBAT  FALLS:  Has patient fallen in last 6 months? No  LIVING ENVIRONMENT: Lives with: lives with their spouse Lives in: House Stairs: single step with 2 grab bars to enter.  Home is 2 story but able to live on first floor.  Has following equipment at home: Single point cane, Walker - 2 wheeled, Grab bars, and built-in shower seat  OCCUPATION: retired.  PLOF: Independent  PATIENT GOALS:  golfing, being active in community.  Next MD visit: 12/09/2022  OBJECTIVE:  DIAGNOSTIC FINDINGS:  Glut med tear of MRI 2021 10/27/22 post surgery X-ray Postsurgical changes of left total knee arthroplasty. No evidence of immediate hardware complication.  PATIENT SURVEYS:  FOTO was just recently discharged so not done again  COGNITION: Overall cognitive status: WFL    SENSATION: WFL  ROM:   ROM Left  Eval     Lumbar flexion WNL     Lumbar  extension WNL     Lumbar sidebending WNL     Lumbar rotation WNL                 Knee flexion P:115     Knee extension P: 5                              (Blank rows = not tested)  12/11/22: supine Lt knee flexion 110 deg, extension -5 deg 02/22/23 Sitting Left knee flexion: 110 deg, extension -4 deg  LOWER EXTREMITY MMT:  MMT  Left Eval  Right Eval   Hip flexion 4+   Hip extension    Hip abduction 4 3  Hip adduction    Hip internal rotation 4+ 3  Hip external rotation 4+ 4  Knee flexion 5 5  Knee extension 5 5  Ankle dorsiflexion    Ankle plantarflexion    Ankle inversion    Ankle eversion     (Blank rows = not tested)  GAIT: Eval  Distance walked: 100'  Assistive device utilized: [facility] Level of assistance: Mod I  Comments: trendelenburg without AD  TODAY'S TREATMENT                                                                          DATE: 02/22/23 TherEx:  Nu step L5 X 8 min LE only Leg press DL 46# [id number], then SL 52# [id number] each side Sit to stand: x 15 working on eccentric control Seated hip abduction green TB x 20 holding 3 sec Standing hip abduction X 15 bilat red band Standing hip extension X 15 bilat red band Seated hip adduciton ball squeezes: x 20 holding 5 sec Seated SLR: 2 x 15 Rt LE    02/17/23 Nu step L5 X 8 min Leg press DL 24# 4W10, then SL 27# 2Z36 each side Seated SLR 2X10 bilat Standing hip abduction X 15 bilat red band Standing hip extension X 15 bilat red band Seated bilat clams 2X15 with blue band around knees Seated bilat hip IR 2 X 10 holding 3 sec with red band around ankles and yellow ball between knees Seated Rt hip ER with PT holding red band 2X10    02/01/23 HEP created for hip/back and reviewed with trial set of each performed Nu step L5 X 6 min    HOME EXERCISE PROGRAM: Access Code: [email or web address] URL: [email or web address] Date: 02/02/2023 Prepared by: Ivery Quale  Exercises - Sidelying Hip Abduction  - 2 x daily - 6 x weekly - 1-2 sets - 10 reps - Sidelying Hip Circles  - 2 x daily - 6 x weekly - 1 sets - 10 reps - Sidelying Reverse Clamshell (Mirrored)  - 2 x daily - 6 x weekly - 1-2 sets - 10 reps - Clamshell with Resistance  - 2 x daily - 6 x weekly - 1-2 sets - 15 reps - Standing Hip Abduction with Resistance at Ankles and Counter  Support  - 2 x daily - 6 x weekly - 1 sets - 15 reps - Standing Hip Extension with Resistance at Ankles and Counter Support  - 2 x daily - 6 x weekly - 1 sets - 15 reps - Standing Single Leg Stance with Unilateral Counter Support  - 2 x daily - 6 x weekly - 1 sets - 3 reps - 20-30 sec hold   ASSESSMENT: CLINICAL IMPRESSION: Pt arriving today with pain of 1-2/10 in his low back and left knee. Pt also reporting decreasing balance and trouble controlling transitions. Pt tolerating strengthening exercises well with muscle fatigue noted at end of session. Pt's Rt knee flexion 110 deg actively in sitting Continue skilled pt interventions to maximize pt's function.    OBJECTIVE IMPAIRMENTS: Abnormal gait, decreased activity tolerance, decreased balance, decreased endurance,  decreased mobility, difficulty walking, decreased ROM, decreased strength, and pain.   ACTIVITY LIMITATIONS: carrying, lifting, bending, sitting, standing, squatting, sleeping, stairs, transfers, and locomotion level  PARTICIPATION LIMITATIONS: meal prep, cleaning, laundry, driving, and community activity  PERSONAL FACTORS: Age, Fitness, Time since onset of injury/illness/exacerbation, and 1-2 comorbidities: see PMH  are also affecting patient's functional outcome.   REHAB POTENTIAL: Good  CLINICAL DECISION MAKING: Stable/uncomplicated  EVALUATION COMPLEXITY: Low   GOALS: Goals reviewed with patient? Yes  SHORT TERM GOALS: (03/02/2023    1.  Patient will  demonstrate independent use of home exercise program to maintain progress from in clinic treatments.  Goal status: On-going 02/22/23  LONG TERM GOALS: (03/16/2023    1. Patient will demonstrate/report pain at worst less than or equal to 3/10 to facilitate minimal limitation in daily activity secondary to pain symptoms.  Goal status: NEW     2.  Patient will demonstrate Rt hip  MMT 4/5 throughout to faciltiate usual transfers, stairs, squatting at [facility] for daily  life.   Goal status:  MET for knee but still with some hip weakness Rt more than Lt 12/21/22    3.  left Knee PROM 3* to 115* Goal status: new  PLAN:  PT FREQUENCY:  2-3x/week  PT DURATION: 10 weeks  PLANNED INTERVENTIONS: Therapeutic exercises, Therapeutic activity, Neuro Muscular re-education, Balance training, Gait training, Patient/Family education, Joint mobilization, Stair training, DME instructions, Dry Needling, Electrical stimulation, Traction, Cryotherapy, vasopneumatic deviceMoist heat, Taping, Ultrasound, Ionotophoresis 4mg /ml Dexamethasone, and aquatic therapy, Manual therapy.  All included unless contraindicated  PLAN FOR NEXT SESSION: Rt hip strength focus now, continue work for left knee strength and extension ROM.  Referring diagnosis? M47.817 Treatment diagnosis? (if different than referring diagnosis) M62.81 What was this (referring dx) caused by? [x]  Surgery []  Fall []  Ongoing issue [x]  Arthritis []  Other: ____________  Laterality: []  Rt []  Lt [x]  Both  Check all possible CPT codes:  *CHOOSE 10 OR LESS*    [x]  97110 (Therapeutic Exercise)  []  92507 (SLP Treatment)  [x]  97112 (Neuro Re-ed)   []  92526 (Swallowing Treatment)   [x]  97116 (Gait Training)   []  [id number] (Cognitive Training, 1st 15 minutes) [x]  97140 (Manual Therapy)   []  97130 (Cognitive Training, each add'l 15 minutes)  []  97164 (Re-evaluation)                              []  Other, List CPT Code ____________  [x]  97530 (Therapeutic Activities)     [x]  97535 (Self Care)   []  All codes above (97110 - 97535)  []  97012 (Mechanical Traction)  []  97014 (E-stim Unattended)  []  97032 (E-stim manual)  []  97033 (Ionto)  []  97035 (Ultrasound) []  97750 (Physical Performance Training) []  [id number] (Aquatic Therapy) []  97016 (Vasopneumatic Device) []  [id number] (Paraffin) []  97034 (Contrast Bath) []  97597 (Wound Care 1st 20 sq cm) []  97598 (Wound Care each add'l 20 sq cm) []  97760 (Orthotic Fabrication,  Fitting, Training Initial) []  [id number] (Prosthetic Management and Training Initial) []  [id number] (Orthotic or Prosthetic Training/ Modification Subsequent)    Sharmon Leyden, PT, MPT 02/22/2023, 2:26 PM

## 2023-02-24 ENCOUNTER — Ambulatory Visit: Payer: Medicare PPO | Admitting: Rehabilitative and Restorative Service Providers"

## 2023-02-24 ENCOUNTER — Encounter: Payer: Self-pay | Admitting: Rehabilitative and Restorative Service Providers"

## 2023-02-24 DIAGNOSIS — M6281 Muscle weakness (generalized): Secondary | ICD-10-CM | POA: Diagnosis not present

## 2023-02-24 DIAGNOSIS — M25562 Pain in left knee: Secondary | ICD-10-CM | POA: Diagnosis not present

## 2023-02-24 DIAGNOSIS — M5459 Other low back pain: Secondary | ICD-10-CM | POA: Diagnosis not present

## 2023-02-24 DIAGNOSIS — R2689 Other abnormalities of gait and mobility: Secondary | ICD-10-CM | POA: Diagnosis not present

## 2023-02-24 DIAGNOSIS — M25551 Pain in right hip: Secondary | ICD-10-CM | POA: Diagnosis not present

## 2023-02-24 NOTE — Therapy (Signed)
OUTPATIENT PHYSICAL THERAPY TREATMENT  Patient Name: Lucas Shepherd MRN: [id number] DOB:[birth year], 89 y.o., male Today's Date: 02/24/2023  END OF SESSION:  PT End of Session - 02/24/23 1303     Visit Number 4    Number of Visits 12    Date for PT Re-Evaluation 03/29/23    Authorization Type [employer]    Authorization - Visit Number 4    Authorization - Number of Visits 12    Progress Note Due on Visit 10    PT Start Time 1259    PT Stop Time 1338    PT Time Calculation (min) 39 min    Activity Tolerance Patient tolerated treatment well    Behavior During Therapy WFL for tasks assessed/performed                   Past Medical History:  Diagnosis Date   Anemia    related to past gastric ulcers   Arthritis    Blood transfusion    BPH (benign prostatic hyperplasia)    External hemorrhoids    GERD (gastroesophageal reflux disease)    nisseum fundoplication 2010   GI bleed 06/2011   got 3 units blood   Hiatal hernia    History of kidney stones    History of TMJ syndrome    mild   History of urinary frequency    Osteoporosis    Urinary urgency    Past Surgical History:  Procedure Laterality Date   ABDOMINAL SURGERY  1985   tummy tuc   CATARACT EXTRACTION Bilateral    COLONOSCOPY     ESOPHAGOGASTRODUODENOSCOPY  06/12/2011   Procedure: ESOPHAGOGASTRODUODENOSCOPY (EGD);  Surgeon: Yancey Flemings, MD;  Location: [facility];  Service: Endoscopy;  Laterality: N/A;   HARDWARE REMOVAL  02/23/2012   Procedure: HARDWARE REMOVAL;  Surgeon: Wyn Forster., MD;  Location: [facility];  Service: Orthopedics;  Laterality: Left;  biceps tenolysis and screw removal   HERNIA REPAIR  2013   Hiatal Hernia Repair   INGUINAL HERNIA REPAIR Right 12/09/2022   Procedure: OPEN RIGHT HERNIA REPAIR INGUINAL ADULT;  Surgeon: Berna Bue, MD;  Location: [facility] OR;  Service: General;  Laterality: Right;   JOINT REPLACEMENT     KNEE ARTHROSCOPY Left 7/13  2017   NISSEN FUNDOPLICATION  2010   ORIF HUMERUS FRACTURE  12/24/2011   Procedure: OPEN REDUCTION INTERNAL FIXATION (ORIF) PROXIMAL HUMERUS FRACTURE;  Surgeon: Wyn Forster., MD;  Location: [facility];  Service: Orthopedics;  Laterality: Left;  ORIF left humerus   SHOULDER ARTHROSCOPY Right 1954   SHOULDER ARTHROSCOPY Left 05/28/2011   x 2   spine fussion     x2   TOTAL KNEE ARTHROPLASTY Left 10/27/2022   Procedure: LEFT TOTAL KNEE ARTHROPLASTY;  Surgeon: Kathryne Hitch, MD;  Location: [facility] OR;  Service: Orthopedics;  Laterality: Left;   TOTAL SHOULDER ARTHROPLASTY Right 2023   x2   Patient Active Problem List   Diagnosis Date Noted   Status post total left knee replacement 10/27/2022   Unilateral primary osteoarthritis, left knee 03/10/2022   Pain in right shoulder 08/21/2021   Pain in right hip 07/03/2020   Low back pain 07/03/2020   Status post laparoscopic fundoplication 07/24/2011   History of peptic ulcer 07/24/2011   Esophageal stricture 06/26/2011   H/O: upper GI bleed 06/26/2011   Edema extremities 06/26/2011   Chest pain 06/26/2011   Anemia due to blood loss, acute 06/26/2011  Syncope and collapse 06/11/2011   Melena 06/11/2011   Anemia 06/11/2011   Azotemia 06/11/2011   Volume depletion 06/11/2011   Leukocytosis 06/11/2011   DYSPHAGIA 08/02/2009   HIATAL HERNIA WITH REFLUX 01/30/2009   VITAMIN B12 DEFICIENCY 01/01/2009   BENIGN PROSTATIC HYPERTROPHY, WITH OBSTRUCTION 12/28/2008   FECAL INCONTINENCE 12/28/2008   GERD 12/27/2008   DIVERTICULOSIS, COLON 12/27/2008   RENAL CALCULUS 12/27/2008    PCP: Farris Has, MD  REFERRING PROVIDER: LIM, MOE  REFERRING DIAG: Lumbosacral spondylosis without myelopathy m47.817, Right hip abductor weakness THERAPY DIAG:  Muscle weakness (generalized)  Pain in right hip  Other low back pain  Acute pain of left knee  Other abnormalities of gait and mobility  Rationale for Evaluation and  Treatment: Rehabilitation  ONSET DATE: 10/27/2022 Left TKA surgery, one month onset of back pain   SUBJECTIVE:   SUBJECTIVE STATEMENT:   Pt indicated no pain to report, "just the weak hip".  Pt indicated   PERTINENT HISTORY: Glut med tear of MRI 2021, OA, osteoporosis,  LBP, spine fusion, peptic ulcer, benign prostate hypertrophy with obstruction, diverticulosis, total shoulder arthroplasty X 2 on BUEs, left ORIF humeral fracture,   PAIN:  NPRS scale: 0/10 upon arrival Pain location: left knee in joint pain comes and goes currently 2/10 Pain description: ache Aggravating factors: not taking Oxy,  tramadol at bedtime Relieving factors: meds, elevation, ice  PRECAUTIONS: Fall  WEIGHT BEARING RESTRICTIONS: Yes LLE WBAT  FALLS:  Has patient fallen in last 6 months? No  LIVING ENVIRONMENT: Lives with: lives with their spouse Lives in: House Stairs: single step with 2 grab bars to enter.  Home is 2 story but able to live on first floor.  Has following equipment at home: Single point cane, Walker - 2 wheeled, Grab bars, and built-in shower seat  OCCUPATION: retired.  PLOF: Independent  PATIENT GOALS:  golfing, being active in community.   OBJECTIVE:  DIAGNOSTIC FINDINGS:  Glut med tear of MRI 2021 10/27/22 post surgery X-ray Postsurgical changes of left total knee arthroplasty. No evidence of immediate hardware complication.  PATIENT SURVEYS:  02/01/2023 FOTO was just recently discharged so not done again  COGNITION: 02/01/2023 Overall cognitive status: [facility]    SENSATION: 02/01/2023 WFL  ROM:   ROM Left  02/01/2023     Lumbar flexion WNL     Lumbar  extension WNL     Lumbar sidebending WNL     Lumbar rotation WNL                 Knee flexion P:115     Knee extension P: 5                              (Blank rows = not tested)  12/11/22: supine Lt knee flexion 110 deg, extension -5 deg 02/22/23 Sitting Left knee flexion: 110 deg, extension -4 deg  LOWER  EXTREMITY MMT:  MMT Left 02/01/2023  Right 02/01/2023   Hip flexion 4+   Hip extension    Hip abduction 4 3  Hip adduction    Hip internal rotation 4+ 3  Hip external rotation 4+ 4  Knee flexion 5 5  Knee extension 5 5  Ankle dorsiflexion    Ankle plantarflexion    Ankle inversion    Ankle eversion     (Blank rows = not tested)  GAIT: 02/24/2023:  SPC use into and during clinic visit with trendelenburg noted.  02/01/2023  Distance walked: 100'  Assistive device utilized: SPC Level of assistance: Mod I Comments: trendelenburg without AD                   TODAY'S TREATMENT                                                                          DATE:  02/24/2023 Therex: Nustep lvl 5 LE only 8 mins Leg press double leg 75 lbs 2 x 15, single leg 43 lbs x 15 bilateral Supine hooklying clam shell x 15 with isometric hold opposite leg green band  Neuro Re-ed Tandem stance in // bars with occasional HHA 1 min x 1 bilateral Lateral stepping in // bars 10 ft x 3 each way with occasional HHA on bar Forward BIG inspired stepping x 12 bilateral with SBA in // bars for weight shift control improvements.     TODAY'S TREATMENT                                                                          DATE: 02/22/23 TherEx:  Nu step L5 X 8 min LE only Leg press DL 78# [id number], then SL 29# [id number] each side Sit to stand: x 15 working on eccentric control Seated hip abduction green TB x 20 holding 3 sec Standing hip abduction X 15 bilat red band Standing hip extension X 15 bilat red band Seated hip adduciton ball squeezes: x 20 holding 5 sec Seated SLR: 2 x 15 Rt LE    TODAY'S TREATMENT                                                                          DATE: 02/17/23 Nu step L5 X 8 min Leg press DL 13# 2G40, then SL 10# 2V25 each side Seated SLR 2X10 bilat Standing hip abduction X 15 bilat red band Standing hip extension X 15 bilat red band Seated bilat clams 2X15 with blue band  around knees Seated bilat hip IR 2 X 10 holding 3 sec with red band around ankles and yellow ball between knees Seated Rt hip ER with PT holding red band 2X10    HOME EXERCISE PROGRAM: Access Code: [email or web address] URL: [email or web address] Date: 02/02/2023 Prepared by: Ivery Quale  Exercises - Sidelying Hip Abduction  - 2 x daily - 6 x weekly - 1-2 sets - 10 reps - Sidelying Hip Circles  - 2 x daily - 6 x weekly - 1 sets - 10 reps - Sidelying Reverse Clamshell (Mirrored)  - 2 x daily - 6 x weekly - 1-2 sets - 10 reps - Clamshell with Resistance  - 2 x daily - 6  x weekly - 1-2 sets - 15 reps - Standing Hip Abduction with Resistance at Ankles and Counter Support  - 2 x daily - 6 x weekly - 1 sets - 15 reps - Standing Hip Extension with Resistance at Ankles and Counter Support  - 2 x daily - 6 x weekly - 1 sets - 15 reps - Standing Single Leg Stance with Unilateral Counter Support  - 2 x daily - 6 x weekly - 1 sets - 3 reps - 20-30 sec hold   ASSESSMENT: CLINICAL IMPRESSION: Continued process of progressive LE strengthening with inclusion of balance activity to help improve stability in ambulation.  Continued skilled PT services warranted at this time.    OBJECTIVE IMPAIRMENTS: Abnormal gait, decreased activity tolerance, decreased balance, decreased endurance,  decreased mobility, difficulty walking, decreased ROM, decreased strength, and pain.   ACTIVITY LIMITATIONS: carrying, lifting, bending, sitting, standing, squatting, sleeping, stairs, transfers, and locomotion level  PARTICIPATION LIMITATIONS: meal prep, cleaning, laundry, driving, and community activity  PERSONAL FACTORS: Age, Fitness, Time since onset of injury/illness/exacerbation, and 1-2 comorbidities: see PMH  are also affecting patient's functional outcome.   REHAB POTENTIAL: Good  CLINICAL DECISION MAKING: Stable/uncomplicated  EVALUATION COMPLEXITY: Low   GOALS: Goals reviewed with patient?  Yes  SHORT TERM GOALS: (03/02/2023    1.  Patient will demonstrate independent use of home exercise program to maintain progress from in clinic treatments.  Goal status: On-going 02/22/23  LONG TERM GOALS: (03/16/2023    1. Patient will demonstrate/report pain at worst less than or equal to 3/10 to facilitate minimal limitation in daily activity secondary to pain symptoms.  Goal status: NEW     2.  Patient will demonstrate Rt hip  MMT 4/5 throughout to faciltiate usual transfers, stairs, squatting at [facility] for daily life.   Goal status:  MET for knee but still with some hip weakness Rt more than Lt 12/21/22    3.  left Knee PROM 3* to 115* Goal status: new  PLAN:  PT FREQUENCY:  2-3x/week  PT DURATION: 10 weeks  PLANNED INTERVENTIONS: Therapeutic exercises, Therapeutic activity, Neuro Muscular re-education, Balance training, Gait training, Patient/Family education, Joint mobilization, Stair training, DME instructions, Dry Needling, Electrical stimulation, Traction, Cryotherapy, vasopneumatic deviceMoist heat, Taping, Ultrasound, Ionotophoresis 4mg /ml Dexamethasone, and aquatic therapy, Manual therapy.  All included unless contraindicated  PLAN FOR NEXT SESSION: Continued strengthening and balance improvements as tolerated.    Chyrel Masson, PT, DPT, OCS, ATC 02/24/23  1:39 PM       Referring diagnosis? M47.817 Treatment diagnosis? (if different than referring diagnosis) M62.81 What was this (referring dx) caused by? [x]  Surgery []  Fall []  Ongoing issue [x]  Arthritis []  Other: ____________  Laterality: []  Rt []  Lt [x]  Both  Check all possible CPT codes:  *CHOOSE 10 OR LESS*    [x]  97110 (Therapeutic Exercise)  []  92507 (SLP Treatment)  [x]  97112 (Neuro Re-ed)   []  92526 (Swallowing Treatment)   [x]  97116 (Gait Training)   []  [id number] (Cognitive Training, 1st 15 minutes) [x]  97140 (Manual Therapy)   []  97130 (Cognitive Training, each add'l 15 minutes)  []  97164  (Re-evaluation)                              []  Other, List CPT Code ____________  [x]  97530 (Therapeutic Activities)     [x]  97535 (Self Care)   []  All codes above (97110 - 97535)  []  97012 (Mechanical Traction)  []   97014 (E-stim Unattended)  []  97032 (E-stim manual)  []  97033 (Ionto)  []  97035 (Ultrasound) []  97750 (Physical Performance Training) []  [phone] (Aquatic Therapy) []  97016 (Vasopneumatic Device) []  [id number] (Paraffin) []  97034 (Contrast Bath) []  97597 (Wound Care 1st 20 sq cm) []  97598 (Wound Care each add'l 20 sq cm) []  97760 (Orthotic Fabrication, Fitting, Training Initial) []  [id number] (Prosthetic Management and Training Initial) []  [id number] (Orthotic or Prosthetic Training/ Modification Subsequent)

## 2023-03-01 ENCOUNTER — Encounter: Payer: Self-pay | Admitting: Orthopaedic Surgery

## 2023-03-01 ENCOUNTER — Encounter: Payer: Self-pay | Admitting: Physical Therapy

## 2023-03-01 ENCOUNTER — Ambulatory Visit: Payer: Medicare PPO | Admitting: Physical Therapy

## 2023-03-01 DIAGNOSIS — M6281 Muscle weakness (generalized): Secondary | ICD-10-CM

## 2023-03-01 DIAGNOSIS — R2689 Other abnormalities of gait and mobility: Secondary | ICD-10-CM

## 2023-03-01 DIAGNOSIS — M5459 Other low back pain: Secondary | ICD-10-CM | POA: Diagnosis not present

## 2023-03-01 DIAGNOSIS — M25551 Pain in right hip: Secondary | ICD-10-CM

## 2023-03-01 DIAGNOSIS — M25562 Pain in left knee: Secondary | ICD-10-CM

## 2023-03-01 NOTE — Therapy (Addendum)
OUTPATIENT PHYSICAL THERAPY TREATMENT  Patient Name: ALEXA GOLEBIEWSKI MRN: [id number] DOB:11/26/1928, 89 y.o., male Today's Date: 03/01/2023  END OF SESSION:  PT End of Session - 03/01/23 1342     Visit Number 5    Number of Visits 12    Date for PT Re-Evaluation 03/29/23    Authorization Type [employer]    Authorization - Visit Number 5    Authorization - Number of Visits 12    Progress Note Due on Visit 10    PT Start Time 1344    PT Stop Time 1423    PT Time Calculation (min) 39 min    Activity Tolerance Patient tolerated treatment well    Behavior During Therapy WFL for tasks assessed/performed                   Past Medical History:  Diagnosis Date   Anemia    related to past gastric ulcers   Arthritis    Blood transfusion    BPH (benign prostatic hyperplasia)    External hemorrhoids    GERD (gastroesophageal reflux disease)    nisseum fundoplication 2010   GI bleed 06/2011   got 3 units blood   Hiatal hernia    History of kidney stones    History of TMJ syndrome    mild   History of urinary frequency    Osteoporosis    Urinary urgency    Past Surgical History:  Procedure Laterality Date   ABDOMINAL SURGERY  1985   tummy tuc   CATARACT EXTRACTION Bilateral    COLONOSCOPY     ESOPHAGOGASTRODUODENOSCOPY  06/12/2011   Procedure: ESOPHAGOGASTRODUODENOSCOPY (EGD);  Surgeon: Yancey Flemings, MD;  Location: [facility];  Service: Endoscopy;  Laterality: N/A;   HARDWARE REMOVAL  02/23/2012   Procedure: HARDWARE REMOVAL;  Surgeon: Wyn Forster., MD;  Location: [facility];  Service: Orthopedics;  Laterality: Left;  biceps tenolysis and screw removal   HERNIA REPAIR  2013   Hiatal Hernia Repair   INGUINAL HERNIA REPAIR Right 12/09/2022   Procedure: OPEN RIGHT HERNIA REPAIR INGUINAL ADULT;  Surgeon: Berna Bue, MD;  Location: [facility];  Service: General;  Laterality: Right;   JOINT REPLACEMENT     KNEE ARTHROSCOPY Left 7/13  2017   NISSEN FUNDOPLICATION  2010   ORIF HUMERUS FRACTURE  12/24/2011   Procedure: OPEN REDUCTION INTERNAL FIXATION (ORIF) PROXIMAL HUMERUS FRACTURE;  Surgeon: Wyn Forster., MD;  Location: [facility];  Service: Orthopedics;  Laterality: Left;  ORIF left humerus   SHOULDER ARTHROSCOPY Right 1954   SHOULDER ARTHROSCOPY Left 05/28/2011   x 2   spine fussion     x2   TOTAL KNEE ARTHROPLASTY Left 10/27/2022   Procedure: LEFT TOTAL KNEE ARTHROPLASTY;  Surgeon: Kathryne Hitch, MD;  Location: [facility] OR;  Service: Orthopedics;  Laterality: Left;   TOTAL SHOULDER ARTHROPLASTY Right 2023   x2   Patient Active Problem List   Diagnosis Date Noted   Status post total left knee replacement 10/27/2022   Unilateral primary osteoarthritis, left knee 03/10/2022   Pain in right shoulder 08/21/2021   Pain in right hip 07/03/2020   Low back pain 07/03/2020   Status post laparoscopic fundoplication 07/24/2011   History of peptic ulcer 07/24/2011   Esophageal stricture 06/26/2011   H/O: upper GI bleed 06/26/2011   Edema extremities 06/26/2011   Chest pain 06/26/2011   Anemia due to blood loss, acute 06/26/2011  Syncope and collapse 06/11/2011   Melena 06/11/2011   Anemia 06/11/2011   Azotemia 06/11/2011   Volume depletion 06/11/2011   Leukocytosis 06/11/2011   DYSPHAGIA 08/02/2009   HIATAL HERNIA WITH REFLUX 01/30/2009   VITAMIN B12 DEFICIENCY 01/01/2009   BENIGN PROSTATIC HYPERTROPHY, WITH OBSTRUCTION 12/28/2008   FECAL INCONTINENCE 12/28/2008   GERD 12/27/2008   DIVERTICULOSIS, COLON 12/27/2008   RENAL CALCULUS 12/27/2008    PCP: Farris Has, MD  REFERRING PROVIDER: LIM, MOE  REFERRING DIAG: Lumbosacral spondylosis without myelopathy m47.817, Right hip abductor weakness THERAPY DIAG:  Muscle weakness (generalized)  Pain in right hip  Other low back pain  Acute pain of left knee  Other abnormalities of gait and mobility  Rationale for Evaluation and  Treatment: Rehabilitation  ONSET DATE: 10/27/2022 Left TKA surgery, one month onset of back pain   SUBJECTIVE:   SUBJECTIVE STATEMENT:   Pt indicated his hip is not really improving in terms of strength. He feels ready to transition to independent rehab program.   PERTINENT HISTORY: Glut med tear of MRI 2021, OA, osteoporosis,  LBP, spine fusion, peptic ulcer, benign prostate hypertrophy with obstruction, diverticulosis, total shoulder arthroplasty X 2 on BUEs, left ORIF humeral fracture,   PAIN:  NPRS scale: 0/10 upon arrival Pain location: left knee in joint pain comes and goes currently 2/10 Pain description: ache Aggravating factors: not taking Oxy,  tramadol at bedtime Relieving factors: meds, elevation, ice  PRECAUTIONS: Fall  WEIGHT BEARING RESTRICTIONS: Yes LLE WBAT  FALLS:  Has patient fallen in last 6 months? No  LIVING ENVIRONMENT: Lives with: lives with their spouse Lives in: House Stairs: single step with 2 grab bars to enter.  Home is 2 story but able to live on first floor.  Has following equipment at home: Single point cane, Walker - 2 wheeled, Grab bars, and built-in shower seat  OCCUPATION: retired.  PLOF: Independent  PATIENT GOALS:  golfing, being active in community.   OBJECTIVE:  DIAGNOSTIC FINDINGS:  Glut med tear of MRI 2021 10/27/22 post surgery X-ray Postsurgical changes of left total knee arthroplasty. No evidence of immediate hardware complication.  PATIENT SURVEYS:  02/01/2023 FOTO was just recently discharged so not done again  COGNITION: 02/01/2023 Overall cognitive status: [facility]    SENSATION: 02/01/2023 WFL  ROM:   ROM Left  02/01/2023     Lumbar flexion WNL     Lumbar  extension WNL     Lumbar sidebending WNL     Lumbar rotation WNL                 Knee flexion P:115     Knee extension P: 5                              (Blank rows = not tested)  12/11/22: supine Lt knee flexion 110 deg, extension -5 deg 02/22/23  Sitting Left knee flexion: 110 deg, extension -4 deg  LOWER EXTREMITY MMT:  MMT Left 02/01/2023  Right 02/01/2023  Right 03/01/23  Hip flexion 4+    Hip extension     Hip abduction 4 3 3   Hip adduction     Hip internal rotation 4+ 3 3  Hip external rotation 4+ 4 4  Knee flexion 5 5   Knee extension 5 5   Ankle dorsiflexion     Ankle plantarflexion     Ankle inversion     Ankle eversion      (  Blank rows = not tested)  GAIT: 02/24/2023:  SPC use into and during clinic visit with trendelenburg noted.   02/01/2023  Distance walked: 100'  Assistive device utilized: SPC Level of assistance: Mod I Comments: trendelenburg without AD                   TODAY'S TREATMENT                                                                          DATE: 03/01/23 Nu step L6-5 X 8 min Leg press DL 72# [id number], then SL 44# [id number] each side Seated SLR 2X10 bilat Standing hip abduction X 15 bilat red band Standing hip extension X 15 bilat red band Seated bilat clams X15 with blue band around knees, then 15 with isometric hold opposite leg  Neuro Re-ed Tandem walking in bars 10 ft X 3 each way with HHA if needed.  Lateral stepping in // bars 10 ft x 3 each way with occasional HHA on bar Forward BIG inspired stepping x 12 bilateral with SBA in // bars for weight shift control improvements.   TODAY'S TREATMENT                                                                          DATE:  02/24/2023 Therex: Nustep lvl 5 LE only 8 mins Leg press double leg 75 lbs 2 x 15, single leg 43 lbs x 15 bilateral Supine hooklying clam shell x 15 with isometric hold opposite leg green band  Neuro Re-ed Tandem stance in // bars with occasional HHA 1 min x 1 bilateral Lateral stepping in // bars 10 ft x 3 each way with occasional HHA on bar Forward BIG inspired stepping x 12 bilateral with SBA in // bars for weight shift control improvements.     TODAY'S TREATMENT                                                                           DATE: 02/22/23 TherEx:  Nu step L5 X 8 min LE only Leg press DL 42# [id number], then SL 38# [id number] each side Sit to stand: x 15 working on eccentric control Seated hip abduction green TB x 20 holding 3 sec Standing hip abduction X 15 bilat red band Standing hip extension X 15 bilat red band Seated hip adduciton ball squeezes: x 20 holding 5 sec Seated SLR: 2 x 15 Rt LE     HOME EXERCISE PROGRAM: Access Code: [email or web address] URL: [email or web address] Date: 02/02/2023 Prepared by: Ivery Quale  Exercises - Sidelying Hip Abduction  - 2 x daily - 6 x weekly - 1-2 sets - 10 reps -  Sidelying Hip Circles  - 2 x daily - 6 x weekly - 1 sets - 10 reps - Sidelying Reverse Clamshell (Mirrored)  - 2 x daily - 6 x weekly - 1-2 sets - 10 reps - Clamshell with Resistance  - 2 x daily - 6 x weekly - 1-2 sets - 15 reps - Standing Hip Abduction with Resistance at Ankles and Counter Support  - 2 x daily - 6 x weekly - 1 sets - 15 reps - Standing Hip Extension with Resistance at Ankles and Counter Support  - 2 x daily - 6 x weekly - 1 sets - 15 reps - Standing Single Leg Stance with Unilateral Counter Support  - 2 x daily - 6 x weekly - 1 sets - 3 reps - 20-30 sec hold   ASSESSMENT: CLINICAL IMPRESSION: He continues to have associated Rt hip weakness from glute tear. At this point he feels ready to transition to independent strengthening program at home. I will hold his PT episode of care open for 30 days before discharge, in the event he wants to return.   OBJECTIVE IMPAIRMENTS: Abnormal gait, decreased activity tolerance, decreased balance, decreased endurance,  decreased mobility, difficulty walking, decreased ROM, decreased strength, and pain.   ACTIVITY LIMITATIONS: carrying, lifting, bending, sitting, standing, squatting, sleeping, stairs, transfers, and locomotion level  PARTICIPATION LIMITATIONS: meal prep, cleaning, laundry, driving, and community  activity  PERSONAL FACTORS: Age, Fitness, Time since onset of injury/illness/exacerbation, and 1-2 comorbidities: see PMH  are also affecting patient's functional outcome.   REHAB POTENTIAL: Good  CLINICAL DECISION MAKING: Stable/uncomplicated  EVALUATION COMPLEXITY: Low   GOALS: Goals reviewed with patient? Yes  SHORT TERM GOALS: (03/02/2023    1.  Patient will demonstrate independent use of home exercise program to maintain progress from in clinic treatments.  Goal status: On-going 02/22/23  LONG TERM GOALS: (03/16/2023    1. Patient will demonstrate/report pain at worst less than or equal to 3/10 to facilitate minimal limitation in daily activity secondary to pain symptoms.  Goal status: NEW     2.  Patient will demonstrate Rt hip  MMT 4/5 throughout to faciltiate usual transfers, stairs, squatting at [facility] for daily life.   Goal status:  MET for knee but still with some hip weakness Rt more than Lt 12/21/22    3.  left Knee PROM 3* to 115* Goal status: new  PLAN:  PT FREQUENCY:  2-3x/week  PT DURATION: 10 weeks  PLANNED INTERVENTIONS: Therapeutic exercises, Therapeutic activity, Neuro Muscular re-education, Balance training, Gait training, Patient/Family education, Joint mobilization, Stair training, DME instructions, Dry Needling, Electrical stimulation, Traction, Cryotherapy, vasopneumatic deviceMoist heat, Taping, Ultrasound, Ionotophoresis 4mg /ml Dexamethasone, and aquatic therapy, Manual therapy.  All included unless contraindicated  PLAN FOR NEXT SESSION: hold PT for 30 days as he trials HEP. Will DC after 30 days if no return.   Ivery Quale, PT, DPT 03/01/23 2:43 PM  PHYSICAL THERAPY DISCHARGE SUMMARY  Visits from Start of Care: 5  Current functional level related to goals / functional outcomes: See above   Remaining deficits: See above   Education / Equipment: HEP  Plan:  Patient goals were mostly met. Patient is being discharged due to not  returning since last visit >30 days  Ivery Quale, PT, DPT 08/24/23 8:34 AM        Referring diagnosis? G95.621 Treatment diagnosis? (if different than referring diagnosis) M62.81 What was this (referring dx) caused by? [x]  Surgery []  Fall []  Ongoing issue [x]  Arthritis []   Other: ____________  Laterality: []  Rt []  Lt [x]  Both  Check all possible CPT codes:  *CHOOSE 10 OR LESS*    [x]  97110 (Therapeutic Exercise)  []  92507 (SLP Treatment)  [x]  97112 (Neuro Re-ed)   []  92526 (Swallowing Treatment)   [x]  97116 (Gait Training)   []  [phone] (Cognitive Training, 1st 15 minutes) [x]  97140 (Manual Therapy)   []  97130 (Cognitive Training, each add'l 15 minutes)  []  97164 (Re-evaluation)                              []  Other, List CPT Code ____________  [x]  97530 (Therapeutic Activities)     [x]  97535 (Self Care)   []  All codes above (97110 - 97535)  []  97012 (Mechanical Traction)  []  97014 (E-stim Unattended)  []  97032 (E-stim manual)  []  97033 (Ionto)  []  97035 (Ultrasound) []  97750 (Physical Performance Training) []  [id number] (Aquatic Therapy) []  97016 (Vasopneumatic Device) []  [id number] (Paraffin) []  19147 (Contrast Bath) []  97597 (Wound Care 1st 20 sq cm) []  97598 (Wound Care each add'l 20 sq cm) []  97760 (Orthotic Fabrication, Fitting, Training Initial) []  [id number] (Prosthetic Management and Training Initial) []  [id number] (Orthotic or Prosthetic Training/ Modification Subsequent)

## 2023-03-02 ENCOUNTER — Other Ambulatory Visit: Payer: Self-pay | Admitting: Orthopaedic Surgery

## 2023-03-02 MED ORDER — TRAMADOL HCL 50 MG PO TABS: 50.0000 mg | ORAL_TABLET | Freq: Three times a day (TID) | ORAL | 0 refills | Status: DC | PRN

## 2023-03-03 ENCOUNTER — Encounter: Payer: Medicare PPO | Admitting: Physical Therapy

## 2023-03-08 ENCOUNTER — Encounter: Payer: Medicare PPO | Admitting: Physical Therapy

## 2023-03-11 ENCOUNTER — Encounter: Payer: Medicare PPO | Admitting: Rehabilitative and Restorative Service Providers"

## 2023-03-16 DIAGNOSIS — E559 Vitamin D deficiency, unspecified: Secondary | ICD-10-CM | POA: Diagnosis not present

## 2023-03-16 DIAGNOSIS — M810 Age-related osteoporosis without current pathological fracture: Secondary | ICD-10-CM | POA: Diagnosis not present

## 2023-03-16 DIAGNOSIS — M4850XA Collapsed vertebra, not elsewhere classified, site unspecified, initial encounter for fracture: Secondary | ICD-10-CM | POA: Diagnosis not present

## 2023-03-18 ENCOUNTER — Telehealth: Payer: Self-pay | Admitting: *Deleted

## 2023-03-18 ENCOUNTER — Telehealth: Payer: Self-pay | Admitting: Orthopaedic Surgery

## 2023-03-18 ENCOUNTER — Ambulatory Visit: Payer: Medicare PPO | Admitting: Orthopaedic Surgery

## 2023-03-18 ENCOUNTER — Encounter: Payer: Self-pay | Admitting: Orthopaedic Surgery

## 2023-03-18 DIAGNOSIS — Z96652 Presence of left artificial knee joint: Secondary | ICD-10-CM | POA: Diagnosis not present

## 2023-03-18 NOTE — Telephone Encounter (Signed)
Mailed receipt to patient today per patient request

## 2023-03-18 NOTE — Progress Notes (Signed)
The patient is a 89 year old gentleman who is getting close to 45-month status post a left total knee arthroplasty.  He does ambulate using a cane and states his come along pretty well.  He feels like there is a leg length difference but that is how he is sitting and I gave him reassurance that usually with total knee replacements we do not increase someone's leg length significantly at all.  On exam his extension is full and his flexion is full of that left knee.  It feels ligamentously stable.  There is only some mild swelling and slight warmth.  From my standpoint he will continue to increase his activities as comfort allows.  The next time we need to see him is not for 6 months unless he is having issues.  At that visit we will have a standing AP and lateral of his left operative knee.

## 2023-03-18 NOTE — Telephone Encounter (Signed)
In office Ortho bundle meeting completed.

## 2023-03-25 ENCOUNTER — Other Ambulatory Visit: Payer: Self-pay | Admitting: Orthopaedic Surgery

## 2023-04-01 ENCOUNTER — Other Ambulatory Visit: Payer: Self-pay | Admitting: Internal Medicine

## 2023-04-02 DIAGNOSIS — Z23 Encounter for immunization: Secondary | ICD-10-CM | POA: Diagnosis not present

## 2023-04-20 ENCOUNTER — Other Ambulatory Visit: Payer: Self-pay | Admitting: Orthopaedic Surgery

## 2023-04-20 MED ORDER — TRAMADOL HCL 50 MG PO TABS: 50.0000 mg | ORAL_TABLET | Freq: Four times a day (QID) | ORAL | 0 refills | Status: DC | PRN

## 2023-05-15 ENCOUNTER — Other Ambulatory Visit: Payer: Self-pay | Admitting: Orthopaedic Surgery

## 2023-05-26 DIAGNOSIS — N401 Enlarged prostate with lower urinary tract symptoms: Secondary | ICD-10-CM | POA: Diagnosis not present

## 2023-05-26 DIAGNOSIS — R351 Nocturia: Secondary | ICD-10-CM | POA: Diagnosis not present

## 2023-05-26 DIAGNOSIS — E559 Vitamin D deficiency, unspecified: Secondary | ICD-10-CM | POA: Diagnosis not present

## 2023-05-26 DIAGNOSIS — M810 Age-related osteoporosis without current pathological fracture: Secondary | ICD-10-CM | POA: Diagnosis not present

## 2023-06-08 ENCOUNTER — Other Ambulatory Visit: Payer: Self-pay | Admitting: Internal Medicine

## 2023-06-24 ENCOUNTER — Other Ambulatory Visit: Payer: Self-pay | Admitting: Orthopaedic Surgery

## 2023-07-01 DIAGNOSIS — M810 Age-related osteoporosis without current pathological fracture: Secondary | ICD-10-CM | POA: Diagnosis not present

## 2023-07-20 ENCOUNTER — Other Ambulatory Visit: Payer: Self-pay | Admitting: Orthopaedic Surgery

## 2023-08-16 ENCOUNTER — Other Ambulatory Visit: Payer: Self-pay | Admitting: Orthopaedic Surgery

## 2023-08-18 DIAGNOSIS — C44622 Squamous cell carcinoma of skin of right upper limb, including shoulder: Secondary | ICD-10-CM | POA: Diagnosis not present

## 2023-08-18 DIAGNOSIS — L570 Actinic keratosis: Secondary | ICD-10-CM | POA: Diagnosis not present

## 2023-08-18 DIAGNOSIS — L738 Other specified follicular disorders: Secondary | ICD-10-CM | POA: Diagnosis not present

## 2023-08-18 DIAGNOSIS — L821 Other seborrheic keratosis: Secondary | ICD-10-CM | POA: Diagnosis not present

## 2023-08-18 DIAGNOSIS — D1722 Benign lipomatous neoplasm of skin and subcutaneous tissue of left arm: Secondary | ICD-10-CM | POA: Diagnosis not present

## 2023-08-18 DIAGNOSIS — Z85828 Personal history of other malignant neoplasm of skin: Secondary | ICD-10-CM | POA: Diagnosis not present

## 2023-08-18 DIAGNOSIS — D485 Neoplasm of uncertain behavior of skin: Secondary | ICD-10-CM | POA: Diagnosis not present

## 2023-08-19 DIAGNOSIS — M19011 Primary osteoarthritis, right shoulder: Secondary | ICD-10-CM | POA: Diagnosis not present

## 2023-08-19 DIAGNOSIS — Z96611 Presence of right artificial shoulder joint: Secondary | ICD-10-CM | POA: Diagnosis not present

## 2023-09-15 ENCOUNTER — Other Ambulatory Visit: Payer: Self-pay | Admitting: Orthopaedic Surgery

## 2023-09-15 ENCOUNTER — Ambulatory Visit: Payer: Medicare PPO | Admitting: Orthopaedic Surgery

## 2023-09-15 ENCOUNTER — Other Ambulatory Visit (INDEPENDENT_AMBULATORY_CARE_PROVIDER_SITE_OTHER): Payer: Self-pay

## 2023-09-15 ENCOUNTER — Encounter: Payer: Self-pay | Admitting: Orthopaedic Surgery

## 2023-09-15 DIAGNOSIS — K219 Gastro-esophageal reflux disease without esophagitis: Secondary | ICD-10-CM | POA: Diagnosis not present

## 2023-09-15 DIAGNOSIS — Z96652 Presence of left artificial knee joint: Secondary | ICD-10-CM

## 2023-09-15 DIAGNOSIS — L089 Local infection of the skin and subcutaneous tissue, unspecified: Secondary | ICD-10-CM | POA: Diagnosis not present

## 2023-09-15 DIAGNOSIS — Z0000 Encounter for general adult medical examination without abnormal findings: Secondary | ICD-10-CM | POA: Diagnosis not present

## 2023-09-15 DIAGNOSIS — E7800 Pure hypercholesterolemia, unspecified: Secondary | ICD-10-CM | POA: Diagnosis not present

## 2023-09-15 DIAGNOSIS — M810 Age-related osteoporosis without current pathological fracture: Secondary | ICD-10-CM | POA: Diagnosis not present

## 2023-09-15 DIAGNOSIS — E538 Deficiency of other specified B group vitamins: Secondary | ICD-10-CM | POA: Diagnosis not present

## 2023-09-15 NOTE — Progress Notes (Signed)
 The patient is well-known to me.  He has 11 months out from a left total knee arthroplasty to treat severe left knee arthritis with significant valgus malalignment.  He does have valgus malalignment of his right knee and this does affect his posture.  This is causing him to have some back pain.  He says the left knee is doing great.  He does ambulate with a cane.  The left knee is nice and straight.  Has excellent range of motion and feels limply stable.  His right knee has significant valgus malalignment.  A standing AP and lateral of the left knee shows a well-seated left total knee arthroplasty with no complicating features.  His right knee does have valgus malalignment on the AP view with significant arthritis.  At some point he may consider a right knee replacement.  I did send some tramadol in for his pain earlier today.  Follow-up for his left knee can be as needed.  All question concerns were addressed and answered.

## 2023-09-17 ENCOUNTER — Telehealth: Payer: Self-pay | Admitting: Internal Medicine

## 2023-09-17 MED ORDER — FAMOTIDINE 20 MG PO TABS: ORAL_TABLET | ORAL | 0 refills | Status: DC

## 2023-09-17 NOTE — Telephone Encounter (Signed)
 Prescription sent to patient's pharmacy per patient's request.

## 2023-09-17 NOTE — Telephone Encounter (Signed)
 Patient called and stated that he is needing a refill of his FAMOTIDINE that his pharmacy has requested. Patient also stated that he would like his instruction on the medication to reflect what he is taking,which he stated was 3 tablets a day. Please advise.

## 2023-10-12 ENCOUNTER — Other Ambulatory Visit: Payer: Self-pay | Admitting: Orthopaedic Surgery

## 2023-10-13 DIAGNOSIS — Z85828 Personal history of other malignant neoplasm of skin: Secondary | ICD-10-CM | POA: Diagnosis not present

## 2023-10-13 DIAGNOSIS — L03011 Cellulitis of right finger: Secondary | ICD-10-CM | POA: Diagnosis not present

## 2023-10-29 DIAGNOSIS — M810 Age-related osteoporosis without current pathological fracture: Secondary | ICD-10-CM | POA: Diagnosis not present

## 2023-11-02 DIAGNOSIS — H43393 Other vitreous opacities, bilateral: Secondary | ICD-10-CM | POA: Diagnosis not present

## 2023-11-02 DIAGNOSIS — Z961 Presence of intraocular lens: Secondary | ICD-10-CM | POA: Diagnosis not present

## 2023-11-02 DIAGNOSIS — H35373 Puckering of macula, bilateral: Secondary | ICD-10-CM | POA: Diagnosis not present

## 2023-11-02 DIAGNOSIS — H353130 Nonexudative age-related macular degeneration, bilateral, stage unspecified: Secondary | ICD-10-CM | POA: Diagnosis not present

## 2023-11-07 ENCOUNTER — Other Ambulatory Visit: Payer: Self-pay | Admitting: Orthopaedic Surgery

## 2023-11-10 DIAGNOSIS — H353221 Exudative age-related macular degeneration, left eye, with active choroidal neovascularization: Secondary | ICD-10-CM | POA: Diagnosis not present

## 2023-11-15 DIAGNOSIS — E559 Vitamin D deficiency, unspecified: Secondary | ICD-10-CM | POA: Diagnosis not present

## 2023-11-15 DIAGNOSIS — M810 Age-related osteoporosis without current pathological fracture: Secondary | ICD-10-CM | POA: Diagnosis not present

## 2023-11-15 DIAGNOSIS — M4850XA Collapsed vertebra, not elsewhere classified, site unspecified, initial encounter for fracture: Secondary | ICD-10-CM | POA: Diagnosis not present

## 2023-12-08 DIAGNOSIS — H353221 Exudative age-related macular degeneration, left eye, with active choroidal neovascularization: Secondary | ICD-10-CM | POA: Diagnosis not present

## 2023-12-11 ENCOUNTER — Other Ambulatory Visit: Payer: Self-pay | Admitting: Orthopaedic Surgery

## 2023-12-27 DIAGNOSIS — M810 Age-related osteoporosis without current pathological fracture: Secondary | ICD-10-CM | POA: Diagnosis not present

## 2023-12-27 DIAGNOSIS — E559 Vitamin D deficiency, unspecified: Secondary | ICD-10-CM | POA: Diagnosis not present

## 2024-01-03 DIAGNOSIS — M810 Age-related osteoporosis without current pathological fracture: Secondary | ICD-10-CM | POA: Diagnosis not present

## 2024-01-09 ENCOUNTER — Other Ambulatory Visit: Payer: Self-pay | Admitting: Physician Assistant

## 2024-02-01 DIAGNOSIS — H35373 Puckering of macula, bilateral: Secondary | ICD-10-CM | POA: Diagnosis not present

## 2024-02-01 DIAGNOSIS — H353110 Nonexudative age-related macular degeneration, right eye, stage unspecified: Secondary | ICD-10-CM | POA: Diagnosis not present

## 2024-02-01 DIAGNOSIS — H43393 Other vitreous opacities, bilateral: Secondary | ICD-10-CM | POA: Diagnosis not present

## 2024-02-01 DIAGNOSIS — Z961 Presence of intraocular lens: Secondary | ICD-10-CM | POA: Diagnosis not present

## 2024-02-01 DIAGNOSIS — S058X2A Other injuries of left eye and orbit, initial encounter: Secondary | ICD-10-CM | POA: Diagnosis not present

## 2024-02-01 DIAGNOSIS — H353221 Exudative age-related macular degeneration, left eye, with active choroidal neovascularization: Secondary | ICD-10-CM | POA: Diagnosis not present

## 2024-02-12 ENCOUNTER — Other Ambulatory Visit: Payer: Self-pay | Admitting: Orthopaedic Surgery

## 2024-02-15 DIAGNOSIS — Z87440 Personal history of urinary (tract) infections: Secondary | ICD-10-CM | POA: Diagnosis not present

## 2024-02-15 DIAGNOSIS — R8290 Unspecified abnormal findings in urine: Secondary | ICD-10-CM | POA: Diagnosis not present

## 2024-02-15 DIAGNOSIS — R000 Tachycardia, unspecified: Secondary | ICD-10-CM | POA: Diagnosis not present

## 2024-02-16 DIAGNOSIS — R000 Tachycardia, unspecified: Secondary | ICD-10-CM | POA: Diagnosis not present

## 2024-02-22 DIAGNOSIS — L570 Actinic keratosis: Secondary | ICD-10-CM | POA: Diagnosis not present

## 2024-02-22 DIAGNOSIS — Z85828 Personal history of other malignant neoplasm of skin: Secondary | ICD-10-CM | POA: Diagnosis not present

## 2024-02-22 DIAGNOSIS — L821 Other seborrheic keratosis: Secondary | ICD-10-CM | POA: Diagnosis not present

## 2024-02-22 DIAGNOSIS — R000 Tachycardia, unspecified: Secondary | ICD-10-CM | POA: Diagnosis not present

## 2024-02-22 DIAGNOSIS — L820 Inflamed seborrheic keratosis: Secondary | ICD-10-CM | POA: Diagnosis not present

## 2024-02-22 DIAGNOSIS — D1801 Hemangioma of skin and subcutaneous tissue: Secondary | ICD-10-CM | POA: Diagnosis not present

## 2024-03-14 ENCOUNTER — Other Ambulatory Visit: Payer: Self-pay | Admitting: Internal Medicine

## 2024-03-16 DIAGNOSIS — R000 Tachycardia, unspecified: Secondary | ICD-10-CM | POA: Diagnosis not present

## 2024-03-19 ENCOUNTER — Other Ambulatory Visit: Payer: Self-pay | Admitting: Orthopaedic Surgery

## 2024-03-20 DIAGNOSIS — R000 Tachycardia, unspecified: Secondary | ICD-10-CM | POA: Diagnosis not present

## 2024-03-28 DIAGNOSIS — H353221 Exudative age-related macular degeneration, left eye, with active choroidal neovascularization: Secondary | ICD-10-CM | POA: Diagnosis not present

## 2024-03-28 DIAGNOSIS — H353110 Nonexudative age-related macular degeneration, right eye, stage unspecified: Secondary | ICD-10-CM | POA: Diagnosis not present

## 2024-03-28 DIAGNOSIS — H35373 Puckering of macula, bilateral: Secondary | ICD-10-CM | POA: Diagnosis not present

## 2024-03-28 DIAGNOSIS — Z961 Presence of intraocular lens: Secondary | ICD-10-CM | POA: Diagnosis not present

## 2024-03-28 DIAGNOSIS — H43393 Other vitreous opacities, bilateral: Secondary | ICD-10-CM | POA: Diagnosis not present

## 2024-03-30 ENCOUNTER — Other Ambulatory Visit (INDEPENDENT_AMBULATORY_CARE_PROVIDER_SITE_OTHER): Payer: Self-pay

## 2024-03-30 ENCOUNTER — Encounter: Payer: Self-pay | Admitting: Physical Medicine and Rehabilitation

## 2024-03-30 ENCOUNTER — Ambulatory Visit: Admitting: Physical Medicine and Rehabilitation

## 2024-03-30 DIAGNOSIS — M542 Cervicalgia: Secondary | ICD-10-CM

## 2024-03-30 DIAGNOSIS — G8929 Other chronic pain: Secondary | ICD-10-CM

## 2024-03-30 DIAGNOSIS — G894 Chronic pain syndrome: Secondary | ICD-10-CM

## 2024-03-30 DIAGNOSIS — M7918 Myalgia, other site: Secondary | ICD-10-CM | POA: Diagnosis not present

## 2024-03-30 DIAGNOSIS — M546 Pain in thoracic spine: Secondary | ICD-10-CM

## 2024-03-30 NOTE — Progress Notes (Signed)
 Pain Scale   Average Pain 4 Patient advising he has  neck and middle back pain that is constant when sitting in chair and getting out of bed.        +Driver, -BT, -Dye Allergies.

## 2024-03-30 NOTE — Progress Notes (Signed)
 Lucas Shepherd - 89 y.o. male MRN [id number]  Date of birth: [birth year]  Office Visit Note: Visit Date: 03/30/2024 PCP: Kip Righter, MD Referred by: Kip Righter, MD  Subjective: Chief Complaint  Patient presents with   Neck - Pain   Middle Back - Pain   HPI: Lucas Shepherd is a 89 y.o. male who comes in today as a self referral for evaluation of chronic, worsening and severe bilateral neck and thoracic back pain. He is managed from orthopedic standpoint by Dr. Lonni Poli. Bilateral neck pain is most severe at this time, ongoing for several years. He does have some radiation up to his head. Pain is constant, no specific aggravating factors. States he can hear popping and clicking noises when he moves his neck. He describes pain as dull and aching sensation, currently rates as 3 out of 10. Some relief of pain with home exercise regimen, rest and use of medications. He does take Tramadol  that is prescribed by Dr. Poli. He also states he intermittently takes Oxycodone  that was left over from surgery. No history of formal physical therapy for his neck. Cervical radiographs from 2016 show multi level degenerative changes and facet arthropathy. No history of cervical surgery/injections.   Also reports chronic, worsening and severe bilateral thoracic back pain. Started several months ago. He feels the pain when sitting up in bed, can radiate out to rib cages intermittently. He describes pain as sharp sensation, currently denies pain at this time. Thoracic radiographs from 2019 show moderate multilevel degenerative changes of the thoracic spine. Interval progression of mild anterior wedge compression deformity at T8 which demonstrates approximately 25% height loss anteriorly. Similar appearance of age-indeterminate moderate anterior wedge compression deformities of T5 and T7. No history of thoracic surgery/injections.   Patients course is complicated by osteoporosis, currently  being treated by Dr. Balan at [facility].      Review of Systems  Musculoskeletal:  Positive for back pain, myalgias and neck pain.  Neurological:  Negative for tingling, sensory change, focal weakness and weakness.  All other systems reviewed and are negative.  Otherwise per HPI.  Assessment & Plan: Visit Diagnoses:    ICD-10-CM   1. Cervicalgia  M54.2 XR Cervical Spine 2 or 3 views    MR CERVICAL SPINE WO CONTRAST    MR THORACIC SPINE WO CONTRAST    2. Chronic bilateral thoracic back pain  M54.6 XR Thoracic Spine 2 View   G89.29 MR THORACIC SPINE WO CONTRAST    3. Myofascial pain syndrome  M79.18 MR THORACIC SPINE WO CONTRAST    4. Chronic pain syndrome  G89.4        Plan: Findings:  1. Chronic, worsening and severe bilateral neck pain radiating up to head. No radicular symptoms down the arms. Patient continues to have severe pain despite good conservative therapies such as home exercise regimen, rest and use of medications. Patients clinical presentation and exam are consistent with cervicalgia and cervical facet mediated pain. There is myofascial tenderness noted to bilateral sternocleidomastoid and levator scapulae regions. He also has pain with side to side rotation of his neck. I obtained cervical radiographs in the office today that shows multi level degenerative changes and facet arthropathy. We discussed treatment plan in detail today. I do think he would benefit from short course of formal physical therapy with a focus on range of motion and developing home exercise regimen. He does not wish to proceed with PT at this time. I did place  order for cervical MRI imaging. We will see him back for review of MRI imaging and discuss further treatment.   2. Chronic, worsening and severe bilateral thoracic back pain, intermittent radiation to both rib cages. Patient continues to have pain despite good conservative therapies such as home exercise regimen, rest and use  of medications. Patients clinical presentation and exam are complex, differentials include myofascial pain syndrome, thoracic radiculopathy and thoracic facet mediated pain. Again, he does not wish to proceed with PT at this time. I also placed order for thoracic MRI imaging to rule out any nerve compression/central stenosis. I will see him back for MRI review.   He has no focal weakness on exam today, good strength to bilateral upper extremities. He is interested in continuing with Tramadol  long term, would consider referral to chronic pain management.       Meds & Orders: No orders of the defined types were placed in this encounter.   Orders Placed This Encounter  Procedures   XR Cervical Spine 2 or 3 views   XR Thoracic Spine 2 View   MR CERVICAL SPINE WO CONTRAST   MR THORACIC SPINE WO CONTRAST    Follow-up: Return for Cervical and Thoracic MRI review.   Procedures: No procedures performed      Clinical History: No specialty comments available.   He reports that he quit smoking about 57 years ago. His smoking use included cigarettes. He started smoking about 69 years ago. He has a 12 pack-year smoking history. He has never used smokeless tobacco. No results for input(s): HGBA1C, LABURIC in the last 8760 hours.  Objective:  VS:  HT:    WT:   BMI:     BP:   HR: bpm  TEMP: ( )  RESP:  Physical Exam Vitals and nursing note reviewed.  HENT:     Head: Normocephalic and atraumatic.     Right Ear: External ear normal.     Left Ear: External ear normal.     Nose: Nose normal.     Mouth/Throat:     Mouth: Mucous membranes are moist.  Eyes:     Extraocular Movements: Extraocular movements intact.  Cardiovascular:     Rate and Rhythm: Normal rate.     Pulses: Normal pulses.  Pulmonary:     Effort: Pulmonary effort is normal.  Abdominal:     General: Abdomen is flat. There is no distension.  Musculoskeletal:        General: Tenderness present.     Cervical back:  Tenderness present.     Comments: Discomfort noted with side-to-side rotation. Patient has good strength in the upper extremities including 5 out of 5 strength in wrist extension, long finger flexion and APB. Shoulder range of motion is full bilaterally without any sign of impingement. There is no atrophy of the hands intrinsically. Sensation intact bilaterally. Myofascial tenderness noted to bilateral sternocleidomastoid and levator scapulae regions upon palpation. Negative Hoffman's sign. Negative Spurling's sign.   Normal thoracic kyphosis noted. No tenderness noted upon palpation of spinous processes. No step off deformity. No pain noted with flexion, extension, lateral bending and rotation. Good strength noted to bilateral upper extremities.      Skin:    General: Skin is warm and dry.     Capillary Refill: Capillary refill takes less than 2 seconds.  Neurological:     General: No focal deficit present.     Mental Status: He is alert and oriented to person, place, and time.  Psychiatric:        Mood and Affect: Mood normal.        Behavior: Behavior normal.     Ortho Exam  Imaging: No results found.  Past Medical/Family/Surgical/Social History: Medications & Allergies reviewed per EMR, new medications updated. Patient Active Problem List   Diagnosis Date Noted   Status post total left knee replacement 10/27/2022   Unilateral primary osteoarthritis, left knee 03/10/2022   Pain in right shoulder 08/21/2021   Pain in right hip 07/03/2020   Low back pain 07/03/2020   Status post laparoscopic fundoplication 07/24/2011   History of peptic ulcer 07/24/2011   Esophageal stricture 06/26/2011   H/O: upper GI bleed 06/26/2011   Edema of extremities 06/26/2011   Chest pain 06/26/2011   Anemia due to blood loss, acute 06/26/2011   Syncope and collapse 06/11/2011   Melena 06/11/2011   Anemia 06/11/2011   Azotemia 06/11/2011   Volume depletion 06/11/2011   Leukocytosis 06/11/2011    DYSPHAGIA 08/02/2009   HIATAL HERNIA WITH REFLUX 01/30/2009   VITAMIN B12 DEFICIENCY 01/01/2009   BENIGN PROSTATIC HYPERTROPHY, WITH OBSTRUCTION 12/28/2008   FECAL INCONTINENCE 12/28/2008   GERD 12/27/2008   DIVERTICULOSIS, COLON 12/27/2008   RENAL CALCULUS 12/27/2008   Past Medical History:  Diagnosis Date   Anemia    related to past gastric ulcers   Arthritis    Blood transfusion    BPH (benign prostatic hyperplasia)    External hemorrhoids    GERD (gastroesophageal reflux disease)    nisseum fundoplication 2010   GI bleed 06/2011   got 3 units blood   Hiatal hernia    History of kidney stones    History of TMJ syndrome    mild   History of urinary frequency    Osteoporosis    Urinary urgency    Family History  Problem Relation Age of Onset   Heart defect Father        aortic valve   Nephrolithiasis Mother    Lung cancer Brother        smoker   Colon cancer Neg Hx    Esophageal cancer Neg Hx    Stomach cancer Neg Hx    Rectal cancer Neg Hx    Past Surgical History:  Procedure Laterality Date   ABDOMINAL SURGERY  1985   tummy tuc   CATARACT EXTRACTION Bilateral    COLONOSCOPY     ESOPHAGOGASTRODUODENOSCOPY  06/12/2011   Procedure: ESOPHAGOGASTRODUODENOSCOPY (EGD);  Surgeon: Norleen Kiang, MD;  Location: [facility];  Service: Endoscopy;  Laterality: N/A;   HARDWARE REMOVAL  02/23/2012   Procedure: HARDWARE REMOVAL;  Surgeon: Lamar LULLA Leonor Mickey., MD;  Location: [facility];  Service: Orthopedics;  Laterality: Left;  biceps tenolysis and screw removal   HERNIA REPAIR  2013   Hiatal Hernia Repair   INGUINAL HERNIA REPAIR Right 12/09/2022   Procedure: OPEN RIGHT HERNIA REPAIR INGUINAL ADULT;  Surgeon: Signe Mitzie LABOR, MD;  [facility];  Service: General;  Laterality: Right;   JOINT REPLACEMENT     KNEE ARTHROSCOPY Left 7/13 2017   NISSEN FUNDOPLICATION  2010   ORIF HUMERUS FRACTURE  12/24/2011   Procedure: OPEN REDUCTION INTERNAL FIXATION  (ORIF) PROXIMAL HUMERUS FRACTURE;  Surgeon: Lamar LULLA Leonor Mickey., MD;  Location: [facility];  Service: Orthopedics;  Laterality: Left;  ORIF left humerus   SHOULDER ARTHROSCOPY Right 1954   SHOULDER ARTHROSCOPY Left 05/28/2011   x 2   spine fussion  x2   TOTAL KNEE ARTHROPLASTY Left 10/27/2022   Procedure: LEFT TOTAL KNEE ARTHROPLASTY;  Surgeon: Vernetta Lonni GRADE, MD;  Location: [facility] OR;  Service: Orthopedics;  Laterality: Left;   TOTAL SHOULDER ARTHROPLASTY Right 2023   x2   Social History   Occupational History   Occupation: retired  Tobacco Use   Smoking status: Former    Current packs/day: 0.00    Average packs/day: 1 pack/day for 12.0 years (12.0 ttl pk-yrs)    Types: Cigarettes    Start date: 05/26/1954    Quit date: 05/26/1966    Years since quitting: 57.8   Smokeless tobacco: Never  Vaping Use   Vaping status: Never Used  Substance and Sexual Activity   Alcohol use: Yes    Alcohol/week: 1.0 standard drink of alcohol    Types: 1 Shots of liquor per week    Comment: red wine with dinner   Drug use: No   Sexual activity: Never

## 2024-04-04 ENCOUNTER — Encounter: Payer: Self-pay | Admitting: Physical Medicine and Rehabilitation

## 2024-04-17 ENCOUNTER — Telehealth: Payer: Self-pay | Admitting: Physical Medicine and Rehabilitation

## 2024-04-17 NOTE — Telephone Encounter (Signed)
 Recent order for cervical MRI imaging has been denied. His bilateral neck pain is a chronic issue, ongoing for several years. His pain is requiring use of Tramadol  daily. Cervical radiographs from 2016 show multi level degenerative changes and severe facet arthritis. He has tried home exercise regimen without significant relief of pain. Would like to go ahead and proceed with lumbar MRI imaging. I am concerned there could be central stenosis given his age and significant arthritic changes.

## 2024-04-18 ENCOUNTER — Other Ambulatory Visit

## 2024-04-18 ENCOUNTER — Ambulatory Visit
Admission: RE | Admit: 2024-04-18 | Discharge: 2024-04-18 | Disposition: A | Source: Ambulatory Visit | Attending: Physical Medicine and Rehabilitation | Admitting: Physical Medicine and Rehabilitation

## 2024-04-18 DIAGNOSIS — M546 Pain in thoracic spine: Secondary | ICD-10-CM | POA: Diagnosis not present

## 2024-04-18 DIAGNOSIS — G8929 Other chronic pain: Secondary | ICD-10-CM

## 2024-04-18 DIAGNOSIS — M7918 Myalgia, other site: Secondary | ICD-10-CM

## 2024-04-18 DIAGNOSIS — M542 Cervicalgia: Secondary | ICD-10-CM

## 2024-05-15 ENCOUNTER — Encounter: Payer: Self-pay | Admitting: Radiology

## 2024-05-18 ENCOUNTER — Telehealth: Payer: Self-pay

## 2024-05-18 NOTE — Telephone Encounter (Signed)
 Called patient made him an appointment with Dr. Barbaraann tomorrow.

## 2024-05-18 NOTE — Telephone Encounter (Signed)
-----   Message from Maude Emmer sent at 05/18/2024 10:51 AM EST ----- Patient of Dr Burton Daughter called indicating he was having more angina/heart problems and primary wanted him seen this week/early next week. Please schedule with Oneal, PA or DOD by next Tuesday

## 2024-05-19 ENCOUNTER — Encounter: Payer: Self-pay | Admitting: Cardiovascular Disease

## 2024-05-19 ENCOUNTER — Ambulatory Visit: Attending: Cardiovascular Disease | Admitting: Cardiovascular Disease

## 2024-05-19 VITALS — BP 98/50 | HR 57 | Ht 68.0 in | Wt 191.0 lb

## 2024-05-19 DIAGNOSIS — R42 Dizziness and giddiness: Secondary | ICD-10-CM

## 2024-05-19 DIAGNOSIS — K219 Gastro-esophageal reflux disease without esophagitis: Secondary | ICD-10-CM

## 2024-05-19 DIAGNOSIS — E860 Dehydration: Secondary | ICD-10-CM | POA: Diagnosis not present

## 2024-05-19 DIAGNOSIS — I471 Supraventricular tachycardia, unspecified: Secondary | ICD-10-CM | POA: Diagnosis not present

## 2024-05-19 NOTE — Progress Notes (Signed)
 Cardiology Office Note:  .   Date:  05/19/2024  ID:  Elsie FORBES Schwalbe, DOB Aug 27, 1928, MRN [id number] PCP: Kip Righter, MD  [facility] Providers Cardiologist:  None { History of Present Illness: .    Chief Complaint  Patient presents with   Dizziness    Lucas Shepherd is a 89 y.o. male with history of GERD, BPH who presents for the evaluation of chest pain at the request of Kip Righter, MD.   History of Present Illness   JERRIT HOREN is a 89 year old male who presents with dizziness.  He experiences episodes of dizziness, particularly in the mornings, which occur when standing or walking. These episodes are brief but frequent. During these episodes, his blood pressure is low, with readings such as 92/50 mmHg. No chest pain, trouble breathing, or falls have been associated with these episodes.  He acknowledges not drinking enough water, estimating his intake to be less than 32 ounces per day, and consumes a lot of caffeinated beverages like coffee, which may contribute to dehydration.  He is currently taking gabapentin  for neuropathy and Flomax  for BPH.  He previously wore a heart monitor, which showed brief episodes of extra heartbeats and a heart rate that dropped to 39 bpm. He does not feel his heart racing and is unaware of these episodes. SVT episodes were frequent but brief, longest 18 seconds. 1.8% PAC burden.          Problem List GERD BPH SVT -119 episodes longest duration 18.1 seconds -1.8% PACs     ROS: All other ROS reviewed and negative. Pertinent positives noted in the HPI.     Studies Reviewed: SABRA   EKG Interpretation Date/Time:  Friday May 19 2024 13:41:45 EST Ventricular Rate:  57 PR Interval:    QRS Duration:  78 QT Interval:  404 QTC Calculation: 393 R Axis:   7  Text Interpretation: Sinus bradycardia Cannot rule out Anterior infarct , age undetermined Confirmed by Barbaraann Kotyk [phone]) on 05/19/2024 1:46:28 PM    Physical Exam:   VS:  BP (!) 98/50 (BP Location: Right Arm, Patient Position: Sitting, Cuff Size: Normal)   Pulse (!) 57   Ht 5' 8 (1.727 m)   Wt 191 lb (86.6 kg)   SpO2 98%   BMI 29.04 kg/m    Wt Readings from Last 3 Encounters:  05/19/24 191 lb (86.6 kg)  12/01/22 185 lb 8 oz (84.1 kg)  10/27/22 185 lb (83.9 kg)    GEN: Well nourished, well developed in no acute distress NECK: No JVD; No carotid bruits CARDIAC: RRR, no murmurs, rubs, gallops RESPIRATORY:  Clear to auscultation without rales, wheezing or rhonchi  ABDOMEN: Soft, non-tender, non-distended EXTREMITIES:  No edema; No deformity  ASSESSMENT AND PLAN: .   Assessment and Plan    Dizziness likely multifactorial due to dehydration and medication effects Dizziness linked to dehydration and medication effects. Blood pressure drops to 92/50 during episodes. EKG shows sinus bradycardia without higher grade AV block. - Increase water intake to 40-60 ounces per day. - Discuss potential medication adjustments with primary care physician. Gabapentin  and flomax  are known to result in dizziness.  - Scheduled echocardiogram to assess cardiac function.  Brief episodes of supraventricular tachycardia not requiring treatment 119 SVT episodes noted, longest 18 seconds, with 1.8% PAC burden. Episodes brief, asymptomatic, likely dehydration-related. - Scheduled echocardiogram to assess cardiac function. - Increase water intake to prevent dehydration. - Brief episodes do not explain symptoms. Does not  require treatment.              Follow-up: Return in about 3 months (around 08/19/2024).  Signed, Darryle DASEN. Barbaraann, MD, [facility]  [facility]  [address] [place], [place] [address] [phone]  1:57 PM

## 2024-05-19 NOTE — Patient Instructions (Addendum)
 Increase your water intake to 40-60 oz daily  Medication Instructions:  Your physician recommends that you continue on your current medications as directed. Please refer to the Current Medication list given to you today.  *If you need a refill on your cardiac medications before your next appointment, please call your pharmacy*  Lab Work: None ordered.  You may go to any [facility] Location for your lab work:  [facility] - [address] ([facility]) - [address] [phone] [address]  [place] - [address]   [place]  - [address]   [place] - [address] - [address] [place]  - [address] [place] - [address] ([facility]   If you have labs (blood work) drawn today and your tests are completely normal, you will receive your results only by: [employer] (if you have MyChart)  If you have any lab test that is abnormal or we need to change your treatment, we will call you or send a MyChart message to review the results.  Testing/Procedures: Your physician has requested that you have an echocardiogram. Echocardiography is a painless test that uses sound waves to create images of your heart. It provides your doctor with information about the size and shape of your heart and how well your heart's chambers and valves are working. This procedure takes approximately one hour. There are no restrictions for this procedure. Please do NOT wear cologne, perfume, aftershave, or lotions (deodorant is allowed). Please arrive 15 minutes prior to your appointment time.  Please note: We ask at that you not bring children with you during ultrasound (echo/ vascular) testing. Due to room size and safety concerns, children are not allowed in the ultrasound rooms during exams. Our front office staff cannot provide observation of children in our lobby area while testing is  being conducted. An adult accompanying a patient to their appointment will only be allowed in the ultrasound room at the discretion of the ultrasound technician under special circumstances. We apologize for any inconvenience.   Follow-Up: At [facility], you and your health needs are our priority.  As part of our continuing mission to provide you with exceptional heart care, we have created designated Provider Care Teams.  These Care Teams include your primary Cardiologist (physician) and Advanced Practice Providers (APPs -  Physician Assistants and Nurse Practitioners) who all work together to provide you with the care you need, when you need it.  Your next appointment:   3/4 months  The format for your next appointment:   In Person  Provider:   Dr Barbaraann

## 2024-05-24 DIAGNOSIS — R3912 Poor urinary stream: Secondary | ICD-10-CM | POA: Diagnosis not present

## 2024-05-24 DIAGNOSIS — N401 Enlarged prostate with lower urinary tract symptoms: Secondary | ICD-10-CM | POA: Diagnosis not present

## 2024-05-24 DIAGNOSIS — R399 Unspecified symptoms and signs involving the genitourinary system: Secondary | ICD-10-CM | POA: Diagnosis not present

## 2024-05-28 ENCOUNTER — Ambulatory Visit
Admission: RE | Admit: 2024-05-28 | Discharge: 2024-05-28 | Disposition: A | Source: Ambulatory Visit | Attending: Physical Medicine and Rehabilitation | Admitting: Physical Medicine and Rehabilitation

## 2024-06-04 ENCOUNTER — Other Ambulatory Visit: Payer: Self-pay | Admitting: Internal Medicine

## 2024-06-06 DIAGNOSIS — H353221 Exudative age-related macular degeneration, left eye, with active choroidal neovascularization: Secondary | ICD-10-CM | POA: Diagnosis not present

## 2024-06-21 ENCOUNTER — Ambulatory Visit (HOSPITAL_COMMUNITY)
Admission: RE | Admit: 2024-06-21 | Discharge: 2024-06-21 | Disposition: A | Discharge status: Home / Self Care | Source: Ambulatory Visit | Attending: Cardiovascular Disease | Admitting: Cardiovascular Disease

## 2024-06-21 ENCOUNTER — Ambulatory Visit: Payer: Self-pay | Admitting: Cardiovascular Disease

## 2024-06-21 ENCOUNTER — Other Ambulatory Visit: Payer: Self-pay | Admitting: Internal Medicine

## 2024-06-21 DIAGNOSIS — R42 Dizziness and giddiness: Secondary | ICD-10-CM | POA: Insufficient documentation

## 2024-06-21 DIAGNOSIS — I3139 Other pericardial effusion (noninflammatory): Secondary | ICD-10-CM | POA: Insufficient documentation

## 2024-06-21 DIAGNOSIS — I340 Nonrheumatic mitral (valve) insufficiency: Secondary | ICD-10-CM | POA: Insufficient documentation

## 2024-06-21 LAB — ECHOCARDIOGRAM COMPLETE
AR max vel: 3.27 cm2
AV Area VTI: 3 cm2
AV Area mean vel: 3.18 cm2
AV Mean grad: 3 mmHg
AV Peak grad: 4.6 mmHg
Ao pk vel: 1.07 m/s
Area-P 1/2: 3.21 cm2
MV M vel: 4.98 m/s
MV Peak grad: 99.2 mmHg
Radius: 0.7 cm
S' Lateral: 2.33 cm

## 2024-06-22 ENCOUNTER — Ambulatory Visit: Admitting: Physical Medicine and Rehabilitation

## 2024-06-22 ENCOUNTER — Encounter: Payer: Self-pay | Admitting: Physical Medicine and Rehabilitation

## 2024-06-22 DIAGNOSIS — M7918 Myalgia, other site: Secondary | ICD-10-CM

## 2024-06-22 DIAGNOSIS — M546 Pain in thoracic spine: Secondary | ICD-10-CM | POA: Diagnosis not present

## 2024-06-22 DIAGNOSIS — G894 Chronic pain syndrome: Secondary | ICD-10-CM | POA: Diagnosis not present

## 2024-06-22 DIAGNOSIS — M542 Cervicalgia: Secondary | ICD-10-CM | POA: Diagnosis not present

## 2024-06-22 DIAGNOSIS — G8929 Other chronic pain: Secondary | ICD-10-CM

## 2024-06-22 NOTE — Progress Notes (Unsigned)
 Pain Scale   Average Pain 3 Patient advising he has chronic Neck and spine pain tha comes and goes.        +Driver, -BT, -Dye Allergies.

## 2024-06-22 NOTE — Progress Notes (Unsigned)
 Lucas Shepherd - 89 y.o. male MRN [id number]  Date of birth: 10/10/1928  Office Visit Note: Visit Date: 06/22/2024 PCP: Kip Righter, MD Referred by: Kip Righter, MD  Subjective: Chief Complaint  Patient presents with   Neck - Pain   Spine - Pain   HPI: Lucas Shepherd is a 89 y.o. male who comes in today for evaluation of chronic bilateral neck and middle back pain. He is here today for MRI review and to discuss further treatment options. Bilateral neck pain is most severe, ongoing for several years, does intermittently radiate up to head. No specific aggravating factors that cause his pain to worsen. States he can hear popping and clicking noises when he moves his neck. He describes pain as dull and aching sensation, currently rates as 1 out of 10. Some relief of pain with home exercise regimen, rest and use of medications. Recent cervical MRI imaging shows varying degrees of moderate to severe multi level foraminal stenosis. No high grade spinal canal stenosis.   Chronic bilateral thoracic back pain. He feels the pain when sitting up in bed, can radiate out to rib cages intermittently. He describes pain as sharp sensation, currently denies pain at this time. His pain has improved since our initial office visit in October. Recent thoracic MRI imaging shows chronic compression fractures, most pronounced at T5, T7, and T8. T8 marrow edema appears more likely due to inferior endplate Schmorl's node than acute or subacute compression. No significant nerve compression, no central canal stenosis.   Patient denies focal weakness, numbness and tingling. No recent trauma or falls. He is currently using cane to assist with ambulation. His course is complicated by osteoporosis, currently being treated by Dr. Balan at [facility].        Review of Systems  Musculoskeletal:  Positive for back pain, myalgias and neck pain.  Neurological:  Negative for tingling, sensory  change, focal weakness and weakness.  All other systems reviewed and are negative.  Otherwise per HPI.  Assessment & Plan: Visit Diagnoses:    ICD-10-CM   1. Cervicalgia  M54.2     2. Chronic bilateral thoracic back pain  M54.6    G89.29     3. Myofascial pain syndrome  M79.18     4. Chronic pain syndrome  G89.4        Plan: Findings:  1. Chronic bilateral neck and middle back pain.  Continues to have mild intermittent pain that does not radiate down the arms.  I discussed recent cervical MRI with him today using imaging and spine model.  Most significant finding is varying degrees of moderate to severe multilevel foraminal stenosis.  No severe spinal canal stenosis noted.  His pain is likely more degenerative in nature.  I explained to him that the popping and cracking noises were likely from movement of tendons and ligaments.  Should his pain return or become radicular in nature we would consider performing C7-T1 interlaminar epidural steroid injection.  I encouraged him to remain active as tolerated.  2. Chronic bilateral thoracic back pain.  His thoracic back pain has improved significantly since our initial office visit in September.  I discussed recent thoracic MRI with him today using imaging and spine model.  There are chronic thoracic compression fractures at T5, T7 and T8.  No acute fractures noted.  No significant nerve impingement noted.  Should his thoracic back pain return would consider short course of formal physical therapy with a focus on manual  treatments, strengthening and possible dry needling.  Overall, patient is doing fairly well at this time, feels he is able to manage his pain with over-the-counter treatments.  No myelopathic symptoms noted on exam today.  He has good strength to bilateral upper extremities. We are happy to see him back as needed.     Meds & Orders: No orders of the defined types were placed in this encounter.  No orders of the defined types were  placed in this encounter.   Follow-up: Return if symptoms worsen or fail to improve.   Procedures: No procedures performed      Clinical History: No specialty comments available.   He reports that he quit smoking about 58 years ago. His smoking use included cigarettes. He started smoking about 70 years ago. He has a 12 pack-year smoking history. He has never used smokeless tobacco. No results for input(s): HGBA1C, LABURIC in the last 8760 hours.  Objective:  VS:  HT:    WT:   BMI:     BP:   HR: bpm  TEMP: ( )  RESP:  Physical Exam Vitals and nursing note reviewed.  HENT:     Head: Normocephalic and atraumatic.     Right Ear: External ear normal.     Left Ear: External ear normal.     Nose: Nose normal.     Mouth/Throat:     Mouth: Mucous membranes are moist.  Eyes:     Extraocular Movements: Extraocular movements intact.  Cardiovascular:     Rate and Rhythm: Normal rate.     Pulses: Normal pulses.  Pulmonary:     Effort: Pulmonary effort is normal.  Abdominal:     General: Abdomen is flat. There is no distension.  Musculoskeletal:        General: Tenderness present.     Cervical back: Tenderness present.     Comments: No discomfort noted with flexion, extension and side-to-side rotation. Patient has good strength in the upper extremities including 5 out of 5 strength in wrist extension, long finger flexion and APB. Shoulder range of motion is full bilaterally without any sign of impingement. There is no atrophy of the hands intrinsically. Sensation intact bilaterally. Negative Hoffman's sign. Negative Spurling's sign.   Normal thoracic kyphosis noted. No tenderness noted upon palpation of spinous processes. No step off deformity. No pain noted with flexion, extension, lateral bending and rotation.    Skin:    General: Skin is warm and dry.     Capillary Refill: Capillary refill takes less than 2 seconds.  Neurological:     General: No focal deficit present.      Mental Status: He is alert and oriented to person, place, and time.  Psychiatric:        Mood and Affect: Mood normal.        Behavior: Behavior normal.     Ortho Exam  Imaging: ECHOCARDIOGRAM COMPLETE Result Date: 06/21/2024    ECHOCARDIOGRAM REPORT   Patient Name:   Lucas Shepherd Date of Exam: 06/21/2024 Medical Rec #:  [id number]           Height:       68.0 in Accession #:    [id number]          Weight:       191.0 lb Date of Birth:  [birth year]          BSA:          2.004 m Patient Age:  89 years            BP:           156/76 mmHg Patient Gender: M                   HR:           71 bpm. Exam Location:  [address] Procedure: 2D Echo, Cardiac Doppler and Color Doppler (Both Spectral and Color            Flow Doppler were utilized during procedure). Indications:    Dizziness  History:        Patient has prior history of Echocardiogram examinations, most                 recent 06/12/2011. Risk Factors:Former Smoker.  Sonographer:    Rosaline Fujisawa MHA, RDMS, RVT, RDCS Referring Phys: [id number] [facility] O'[place]  Sonographer Comments: Suboptimal subcostal window. Image acquisition challenging due to respiratory motion and restricted mobility. IMPRESSIONS  1. Left ventricular ejection fraction, by estimation, is 60 to 65%. The left ventricle has normal function. The left ventricle has no regional wall motion abnormalities. Left ventricular diastolic parameters are consistent with Grade I diastolic dysfunction (impaired relaxation).  2. Right ventricular systolic function is low normal. The right ventricular size is normal. There is mildly elevated pulmonary artery systolic pressure. The estimated right ventricular systolic pressure is 38.9 mmHg.  3. Right atrial size was severely dilated.  4. Moderate pericardial effusion. The pericardial effusion is anterior to the right ventricle. There is no evidence of cardiac tamponade.  5. The mitral valve is degenerative. Mild mitral valve  regurgitation.  6. The aortic valve is tricuspid. Aortic valve regurgitation is not visualized. Aortic valve sclerosis/calcification is present, without any evidence of aortic stenosis.  7. The inferior vena cava is normal in size with <50% respiratory variability, suggesting right atrial pressure of 8 mmHg. Comparison(s): No prior Echocardiogram. FINDINGS  Left Ventricle: Left ventricular ejection fraction, by estimation, is 60 to 65%. The left ventricle has normal function. The left ventricle has no regional wall motion abnormalities. The left ventricular internal cavity size was normal in size. There is  no left ventricular hypertrophy. Left ventricular diastolic parameters are consistent with Grade I diastolic dysfunction (impaired relaxation). Indeterminate filling pressures. Right Ventricle: The right ventricular size is normal. No increase in right ventricular wall thickness. Right ventricular systolic function is low normal. There is mildly elevated pulmonary artery systolic pressure. The tricuspid regurgitant velocity is 2.78 m/s, and with an assumed right atrial pressure of 8 mmHg, the estimated right ventricular systolic pressure is 38.9 mmHg. Left Atrium: Left atrial size was normal in size. Right Atrium: Right atrial size was severely dilated. Pericardium: A moderately sized pericardial effusion is present. The pericardial effusion is anterior to the right ventricle. There is no evidence of cardiac tamponade. Mitral Valve: The mitral valve is degenerative in appearance. Mild to moderate mitral annular calcification. Mild mitral valve regurgitation. Tricuspid Valve: The tricuspid valve is grossly normal. Tricuspid valve regurgitation is trivial. Aortic Valve: The aortic valve is tricuspid. Aortic valve regurgitation is not visualized. Aortic valve sclerosis/calcification is present, without any evidence of aortic stenosis. Aortic valve mean gradient measures 3.0 mmHg. Aortic valve peak gradient measures  4.6 mmHg. Aortic valve area, by VTI measures 3.00 cm. Pulmonic Valve: The pulmonic valve was normal in structure. Pulmonic valve regurgitation is not visualized. Aorta: The aortic root and ascending aorta are structurally normal, with no evidence of  dilitation. Venous: The inferior vena cava is normal in size with less than 50% respiratory variability, suggesting right atrial pressure of 8 mmHg. IAS/Shunts: No atrial level shunt detected by color flow Doppler.  LEFT VENTRICLE PLAX 2D LVIDd:         3.81 cm LVIDs:         2.33 cm LV PW:         0.84 cm LV IVS:        0.91 cm LVOT diam:     2.11 cm LV SV:         80 LV SV Index:   40 LVOT Area:     3.50 cm  RIGHT VENTRICLE             IVC RV Basal diam:  3.30 cm     IVC diam: 1.91 cm RV Mid diam:    2.51 cm RV S prime:     10.70 cm/s TAPSE (M-mode): 2.2 cm LEFT ATRIUM             Index        RIGHT ATRIUM           Index LA diam:        3.95 cm 1.97 cm/m   RA Area:     24.80 cm LA Vol (A2C):   56.9 ml 28.39 ml/m  RA Volume:   75.90 ml  37.87 ml/m LA Vol (A4C):   44.2 ml 22.05 ml/m LA Biplane Vol: 51.1 ml 25.50 ml/m  AORTIC VALVE AV Area (Vmax):    3.27 cm AV Area (Vmean):   3.18 cm AV Area (VTI):     3.00 cm AV Vmax:           107.00 cm/s AV Vmean:          73.300 cm/s AV VTI:            0.267 m AV Peak Grad:      4.6 mmHg AV Mean Grad:      3.0 mmHg LVOT Vmax:         100.00 cm/s LVOT Vmean:        66.600 cm/s LVOT VTI:          0.229 m LVOT/AV VTI ratio: 0.86  AORTA Ao Root diam: 3.35 cm Ao Asc diam:  3.39 cm MITRAL VALVE                           TRICUSPID VALVE MV Area (PHT): 3.21 cm                TR Peak grad:   30.9 mmHg MV Decel Time: 236 msec                TR Vmax:        278.00 cm/s MR Peak grad:    99.2 mmHg MR Vmax:         498.00 cm/s           SHUNTS MR PISA Nyquist:             -0.3 m/s  Systemic VTI:  0.23 m MR PISA:         3.08 cm              Systemic Diam: 2.11 cm MR PISA Radius:  0.70 cm MV E velocity: 83.30 cm/s MV A velocity:  103.00 cm/s MV E/A ratio:  0.81 Vinie Maxcy MD Electronically signed  by Vinie Maxcy MD Signature Date/Time: 06/21/2024/5:14:59 PM    Final     Past Medical/Family/Surgical/Social History: Medications & Allergies reviewed per EMR, new medications updated. Patient Active Problem List   Diagnosis Date Noted   Status post total left knee replacement 10/27/2022   Unilateral primary osteoarthritis, left knee 03/10/2022   Pain in right shoulder 08/21/2021   Pain in right hip 07/03/2020   Low back pain 07/03/2020   Status post laparoscopic fundoplication 07/24/2011   History of peptic ulcer 07/24/2011   Esophageal stricture 06/26/2011   H/O: upper GI bleed 06/26/2011   Edema of extremities 06/26/2011   Chest pain 06/26/2011   Anemia due to blood loss, acute 06/26/2011   Syncope and collapse 06/11/2011   Melena 06/11/2011   Anemia 06/11/2011   Azotemia 06/11/2011   Volume depletion 06/11/2011   Leukocytosis 06/11/2011   DYSPHAGIA 08/02/2009   HIATAL HERNIA WITH REFLUX 01/30/2009   VITAMIN B12 DEFICIENCY 01/01/2009   BENIGN PROSTATIC HYPERTROPHY, WITH OBSTRUCTION 12/28/2008   FECAL INCONTINENCE 12/28/2008   GERD 12/27/2008   DIVERTICULOSIS, COLON 12/27/2008   RENAL CALCULUS 12/27/2008   Past Medical History:  Diagnosis Date   Anemia    related to past gastric ulcers   Arthritis    Blood transfusion    BPH (benign prostatic hyperplasia)    External hemorrhoids    GERD (gastroesophageal reflux disease)    nisseum fundoplication 2010   GI bleed 06/2011   got 3 units blood   Hiatal hernia    History of kidney stones    History of TMJ syndrome    mild   History of urinary frequency    Osteoporosis    Urinary urgency    Family History  Problem Relation Age of Onset   Heart defect Father        aortic valve   Nephrolithiasis Mother    Lung cancer Brother        smoker   Colon cancer Neg Hx    Esophageal cancer Neg Hx    Stomach cancer Neg Hx    Rectal cancer Neg Hx     Past Surgical History:  Procedure Laterality Date   ABDOMINAL SURGERY  1985   tummy tuc   CATARACT EXTRACTION Bilateral    COLONOSCOPY     ESOPHAGOGASTRODUODENOSCOPY  06/12/2011   Procedure: ESOPHAGOGASTRODUODENOSCOPY (EGD);  Surgeon: Norleen Kiang, MD;  Location: [facility];  Service: Endoscopy;  Laterality: N/A;   HARDWARE REMOVAL  02/23/2012   Procedure: HARDWARE REMOVAL;  Surgeon: Lamar LULLA Leonor Mickey., MD;  Location: [facility];  Service: Orthopedics;  Laterality: Left;  biceps tenolysis and screw removal   HERNIA REPAIR  2013   Hiatal Hernia Repair   INGUINAL HERNIA REPAIR Right 12/09/2022   Procedure: OPEN RIGHT HERNIA REPAIR INGUINAL ADULT;  Surgeon: Signe Mitzie LABOR, MD;  Location: [facility] OR;  Service: General;  Laterality: Right;   JOINT REPLACEMENT     KNEE ARTHROSCOPY Left 7/13 2017   NISSEN FUNDOPLICATION  2010   ORIF HUMERUS FRACTURE  12/24/2011   Procedure: OPEN REDUCTION INTERNAL FIXATION (ORIF) PROXIMAL HUMERUS FRACTURE;  Surgeon: Lamar LULLA Leonor Mickey., MD;  Location: [facility];  Service: Orthopedics;  Laterality: Left;  ORIF left humerus   SHOULDER ARTHROSCOPY Right 1954   SHOULDER ARTHROSCOPY Left 05/28/2011   x 2   spine fussion     x2   TOTAL KNEE ARTHROPLASTY Left 10/27/2022   Procedure: LEFT TOTAL KNEE ARTHROPLASTY;  Surgeon: Vernetta Bruckner  Y, MD;  Location: [facility] OR;  Service: Orthopedics;  Laterality: Left;   TOTAL SHOULDER ARTHROPLASTY Right 2023   x2   Social History   Occupational History   Occupation: retired  Tobacco Use   Smoking status: Former    Current packs/day: 0.00    Average packs/day: 1 pack/day for 12.0 years (12.0 ttl pk-yrs)    Types: Cigarettes    Start date: 05/26/1954    Quit date: 05/26/1966    Years since quitting: 58.1   Smokeless tobacco: Never  Vaping Use   Vaping status: Never Used  Substance and Sexual Activity   Alcohol use: Yes    Alcohol/week: 1.0 standard drink of alcohol    Types: 1  Shots of liquor per week    Comment: red wine with dinner   Drug use: No   Sexual activity: Never

## 2024-07-11 ENCOUNTER — Ambulatory Visit: Admitting: Cardiovascular Disease

## 2024-08-30 ENCOUNTER — Ambulatory Visit: Admitting: Cardiovascular Disease
